# Patient Record
Sex: Female | Born: 1935 | Race: White | Hispanic: No | Smoking: Former smoker
Health system: Southern US, Community
[De-identification: ages and names within clinical notes are randomized; demographics above are authoritative.]

## PROBLEM LIST (undated history)

## (undated) DIAGNOSIS — H6123 Impacted cerumen, bilateral: Secondary | ICD-10-CM

## (undated) DIAGNOSIS — H9193 Unspecified hearing loss, bilateral: Secondary | ICD-10-CM

## (undated) DIAGNOSIS — M419 Scoliosis, unspecified: Secondary | ICD-10-CM

## (undated) DIAGNOSIS — Z0000 Encounter for general adult medical examination without abnormal findings: Secondary | ICD-10-CM

## (undated) DIAGNOSIS — Z23 Encounter for immunization: Secondary | ICD-10-CM

## (undated) DIAGNOSIS — R7989 Other specified abnormal findings of blood chemistry: Secondary | ICD-10-CM

## (undated) DIAGNOSIS — G3184 Mild cognitive impairment, so stated: Secondary | ICD-10-CM

## (undated) DIAGNOSIS — E7800 Pure hypercholesterolemia, unspecified: Secondary | ICD-10-CM

## (undated) DIAGNOSIS — R351 Nocturia: Secondary | ICD-10-CM

## (undated) DIAGNOSIS — I341 Nonrheumatic mitral (valve) prolapse: Secondary | ICD-10-CM

## (undated) DIAGNOSIS — R2681 Unsteadiness on feet: Secondary | ICD-10-CM

## (undated) DIAGNOSIS — C50919 Malignant neoplasm of unspecified site of unspecified female breast: Secondary | ICD-10-CM

## (undated) DIAGNOSIS — M1612 Unilateral primary osteoarthritis, left hip: Secondary | ICD-10-CM

## (undated) DIAGNOSIS — I10 Essential (primary) hypertension: Secondary | ICD-10-CM

## (undated) DIAGNOSIS — M533 Sacrococcygeal disorders, not elsewhere classified: Secondary | ICD-10-CM

## (undated) DIAGNOSIS — F0390 Unspecified dementia without behavioral disturbance: Secondary | ICD-10-CM

## (undated) DIAGNOSIS — J069 Acute upper respiratory infection, unspecified: Secondary | ICD-10-CM

## (undated) DIAGNOSIS — R5383 Other fatigue: Secondary | ICD-10-CM

## (undated) DIAGNOSIS — I4510 Unspecified right bundle-branch block: Secondary | ICD-10-CM

## (undated) DIAGNOSIS — R4701 Aphasia: Secondary | ICD-10-CM

## (undated) DIAGNOSIS — F0150 Vascular dementia without behavioral disturbance: Secondary | ICD-10-CM

## (undated) DIAGNOSIS — M810 Age-related osteoporosis without current pathological fracture: Secondary | ICD-10-CM

## (undated) DIAGNOSIS — R002 Palpitations: Secondary | ICD-10-CM

## (undated) DIAGNOSIS — E871 Hypo-osmolality and hyponatremia: Secondary | ICD-10-CM

## (undated) DIAGNOSIS — R413 Other amnesia: Secondary | ICD-10-CM

## (undated) HISTORY — DX: Unilateral primary osteoarthritis, left hip: M16.12

## (undated) HISTORY — DX: Encounter for immunization: Z23

## (undated) HISTORY — DX: Scoliosis, unspecified: M41.9

## (undated) HISTORY — DX: Unspecified hearing loss, bilateral: H91.93

## (undated) HISTORY — DX: Pure hypercholesterolemia, unspecified: E78.00

## (undated) HISTORY — DX: Unspecified dementia, unspecified severity, without behavioral disturbance, psychotic disturbance, mood disturbance, and anxiety: F03.90

## (undated) HISTORY — DX: Sacrococcygeal disorders, not elsewhere classified: M53.3

## (undated) HISTORY — DX: Impacted cerumen, bilateral: H61.23

## (undated) HISTORY — DX: Vascular dementia, unspecified severity, without behavioral disturbance, psychotic disturbance, mood disturbance, and anxiety: F01.50

## (undated) HISTORY — DX: Unsteadiness on feet: R26.81

## (undated) HISTORY — DX: Palpitations: R00.2

## (undated) HISTORY — DX: Other amnesia: R41.3

## (undated) HISTORY — DX: Acute upper respiratory infection, unspecified: J06.9

## (undated) HISTORY — PX: OTHER SURGICAL HISTORY: SHX169

## (undated) HISTORY — DX: Other fatigue: R53.83

## (undated) HISTORY — DX: Mild cognitive impairment of uncertain or unknown etiology: G31.84

## (undated) HISTORY — DX: Nonrheumatic mitral (valve) prolapse: I34.1

## (undated) HISTORY — DX: Essential (primary) hypertension: I10

## (undated) HISTORY — DX: Encounter for general adult medical examination without abnormal findings: Z00.00

## (undated) HISTORY — DX: Nocturia: R35.1

## (undated) HISTORY — DX: Malignant neoplasm of unspecified site of unspecified female breast: C50.919

## (undated) HISTORY — DX: Unspecified right bundle-branch block: I45.10

## (undated) HISTORY — DX: Age-related osteoporosis without current pathological fracture: M81.0

## (undated) HISTORY — DX: Other specified abnormal findings of blood chemistry: R79.89

## (undated) HISTORY — DX: Aphasia: R47.01

## (undated) HISTORY — PX: TONSILLECTOMY: SHX5217

---
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
## 1961-01-06 HISTORY — PX: TUBAL LIGATION: SHX77

## 2000-08-05 DIAGNOSIS — R002 Palpitations: Secondary | ICD-10-CM

## 2000-08-05 HISTORY — DX: Palpitations: R00.2

## 2001-09-28 DIAGNOSIS — M810 Age-related osteoporosis without current pathological fracture: Secondary | ICD-10-CM

## 2001-09-28 HISTORY — DX: Age-related osteoporosis without current pathological fracture: M81.0

## 2003-01-03 ENCOUNTER — Ambulatory Visit (HOSPITAL_COMMUNITY): Admission: RE | Admit: 2003-01-03 | Discharge: 2003-01-03 | Payer: Self-pay | Admitting: *Deleted

## 2006-07-14 ENCOUNTER — Inpatient Hospital Stay (HOSPITAL_COMMUNITY): Admission: EM | Admit: 2006-07-14 | Discharge: 2006-07-17 | Payer: Self-pay | Admitting: Emergency Medicine

## 2006-07-15 HISTORY — PX: LAPAROSCOPIC LYSIS OF ADHESIONS: SHX5905

## 2007-11-24 ENCOUNTER — Encounter: Admission: RE | Admit: 2007-11-24 | Discharge: 2007-11-24 | Payer: Self-pay | Admitting: Radiology

## 2007-12-23 ENCOUNTER — Encounter: Admission: RE | Admit: 2007-12-23 | Discharge: 2007-12-23 | Payer: Self-pay | Admitting: Surgery

## 2007-12-27 ENCOUNTER — Ambulatory Visit (HOSPITAL_BASED_OUTPATIENT_CLINIC_OR_DEPARTMENT_OTHER): Admission: RE | Admit: 2007-12-27 | Discharge: 2007-12-27 | Payer: Self-pay | Admitting: Surgery

## 2007-12-27 HISTORY — PX: BREAST LUMPECTOMY: SHX2

## 2008-01-19 ENCOUNTER — Ambulatory Visit: Payer: Self-pay | Admitting: Oncology

## 2008-02-02 LAB — CBC WITH DIFFERENTIAL/PLATELET
BASO%: 0.4 % (ref 0.0–2.0)
Basophils Absolute: 0 10*3/uL (ref 0.0–0.1)
EOS%: 0.9 % (ref 0.0–7.0)
HGB: 13.4 g/dL (ref 11.6–15.9)
MCH: 33 pg (ref 26.0–34.0)
MCHC: 34.3 g/dL (ref 32.0–36.0)
MCV: 96.2 fL (ref 81.0–101.0)
MONO%: 10.1 % (ref 0.0–13.0)
NEUT%: 55.5 % (ref 39.6–76.8)
RDW: 13.2 % (ref 11.3–14.5)
lymph#: 1.6 10*3/uL (ref 0.9–3.3)

## 2008-02-03 ENCOUNTER — Ambulatory Visit: Admission: RE | Admit: 2008-02-03 | Discharge: 2008-04-04 | Payer: Self-pay | Admitting: Radiation Oncology

## 2008-02-03 LAB — COMPREHENSIVE METABOLIC PANEL
ALT: 14 U/L (ref 0–35)
AST: 18 U/L (ref 0–37)
Alkaline Phosphatase: 68 U/L (ref 39–117)
BUN: 17 mg/dL (ref 6–23)
Chloride: 97 mEq/L (ref 96–112)
Creatinine, Ser: 0.84 mg/dL (ref 0.40–1.20)
Potassium: 4.1 mEq/L (ref 3.5–5.3)

## 2008-02-03 LAB — VITAMIN D 25 HYDROXY (VIT D DEFICIENCY, FRACTURES): Vit D, 25-Hydroxy: 43 ng/mL (ref 30–89)

## 2008-02-15 ENCOUNTER — Ambulatory Visit (HOSPITAL_COMMUNITY): Admission: RE | Admit: 2008-02-15 | Discharge: 2008-02-15 | Payer: Self-pay | Admitting: Oncology

## 2008-04-04 ENCOUNTER — Ambulatory Visit: Payer: Self-pay | Admitting: Oncology

## 2008-04-07 LAB — CBC WITH DIFFERENTIAL/PLATELET
Basophils Absolute: 0 10*3/uL (ref 0.0–0.1)
EOS%: 0.6 % (ref 0.0–7.0)
Eosinophils Absolute: 0 10*3/uL (ref 0.0–0.5)
HGB: 12.9 g/dL (ref 11.6–15.9)
MCH: 33 pg (ref 25.1–34.0)
MONO#: 0.5 10*3/uL (ref 0.1–0.9)
NEUT#: 3.5 10*3/uL (ref 1.5–6.5)
RDW: 13.4 % (ref 11.2–14.5)
WBC: 5.6 10*3/uL (ref 3.9–10.3)
lymph#: 1.5 10*3/uL (ref 0.9–3.3)

## 2008-04-10 LAB — COMPREHENSIVE METABOLIC PANEL
AST: 20 U/L (ref 0–37)
Albumin: 4.4 g/dL (ref 3.5–5.2)
BUN: 21 mg/dL (ref 6–23)
CO2: 29 mEq/L (ref 19–32)
Calcium: 9.3 mg/dL (ref 8.4–10.5)
Chloride: 98 mEq/L (ref 96–112)
Potassium: 4.5 mEq/L (ref 3.5–5.3)

## 2008-04-10 LAB — VITAMIN D 25 HYDROXY (VIT D DEFICIENCY, FRACTURES): Vit D, 25-Hydroxy: 68 ng/mL (ref 30–89)

## 2008-07-26 ENCOUNTER — Ambulatory Visit: Payer: Self-pay | Admitting: Oncology

## 2008-07-28 LAB — COMPREHENSIVE METABOLIC PANEL
ALT: 14 U/L (ref 0–35)
AST: 19 U/L (ref 0–37)
Albumin: 4.3 g/dL (ref 3.5–5.2)
Alkaline Phosphatase: 55 U/L (ref 39–117)
BUN: 15 mg/dL (ref 6–23)
Calcium: 9.3 mg/dL (ref 8.4–10.5)
Chloride: 100 mEq/L (ref 96–112)
Potassium: 4.2 mEq/L (ref 3.5–5.3)
Sodium: 135 mEq/L (ref 135–145)

## 2008-07-28 LAB — CBC WITH DIFFERENTIAL/PLATELET
BASO%: 0.4 % (ref 0.0–2.0)
EOS%: 0.8 % (ref 0.0–7.0)
HCT: 38.5 % (ref 34.8–46.6)
MCH: 32.6 pg (ref 25.1–34.0)
MCHC: 33.8 g/dL (ref 31.5–36.0)
MONO#: 0.4 10*3/uL (ref 0.1–0.9)
RBC: 4 10*6/uL (ref 3.70–5.45)
RDW: 13.6 % (ref 11.2–14.5)
WBC: 4.8 10*3/uL (ref 3.9–10.3)
lymph#: 1.6 10*3/uL (ref 0.9–3.3)

## 2008-07-28 LAB — VITAMIN D 25 HYDROXY (VIT D DEFICIENCY, FRACTURES): Vit D, 25-Hydroxy: 35 ng/mL (ref 30–89)

## 2008-07-28 LAB — LACTATE DEHYDROGENASE: LDH: 142 U/L (ref 94–250)

## 2008-11-15 ENCOUNTER — Ambulatory Visit: Payer: Self-pay | Admitting: Oncology

## 2008-11-17 LAB — CBC WITH DIFFERENTIAL/PLATELET
BASO%: 0.5 % (ref 0.0–2.0)
Basophils Absolute: 0 10*3/uL (ref 0.0–0.1)
EOS%: 0.5 % (ref 0.0–7.0)
HGB: 13.2 g/dL (ref 11.6–15.9)
MCH: 33.4 pg (ref 25.1–34.0)
MCHC: 33.9 g/dL (ref 31.5–36.0)
MONO#: 0.5 10*3/uL (ref 0.1–0.9)
RDW: 13.5 % (ref 11.2–14.5)
WBC: 5.8 10*3/uL (ref 3.9–10.3)
lymph#: 1.8 10*3/uL (ref 0.9–3.3)

## 2008-11-18 LAB — COMPREHENSIVE METABOLIC PANEL
ALT: 12 U/L (ref 0–35)
AST: 19 U/L (ref 0–37)
Albumin: 4.6 g/dL (ref 3.5–5.2)
CO2: 27 mEq/L (ref 19–32)
Calcium: 9.3 mg/dL (ref 8.4–10.5)
Chloride: 98 mEq/L (ref 96–112)
Potassium: 4.2 mEq/L (ref 3.5–5.3)

## 2008-11-18 LAB — VITAMIN D 25 HYDROXY (VIT D DEFICIENCY, FRACTURES): Vit D, 25-Hydroxy: 47 ng/mL (ref 30–89)

## 2008-12-11 DIAGNOSIS — C50919 Malignant neoplasm of unspecified site of unspecified female breast: Secondary | ICD-10-CM

## 2008-12-11 HISTORY — DX: Malignant neoplasm of unspecified site of unspecified female breast: C50.919

## 2009-03-20 ENCOUNTER — Ambulatory Visit: Payer: Self-pay | Admitting: Oncology

## 2009-03-22 LAB — CBC WITH DIFFERENTIAL/PLATELET
Basophils Absolute: 0 10*3/uL (ref 0.0–0.1)
Eosinophils Absolute: 0 10*3/uL (ref 0.0–0.5)
HGB: 14 g/dL (ref 11.6–15.9)
MCV: 99.5 fL (ref 79.5–101.0)
MONO#: 0.4 10*3/uL (ref 0.1–0.9)
NEUT#: 3.9 10*3/uL (ref 1.5–6.5)
RDW: 14 % (ref 11.2–14.5)
WBC: 5.8 10*3/uL (ref 3.9–10.3)
lymph#: 1.4 10*3/uL (ref 0.9–3.3)

## 2009-03-22 LAB — COMPREHENSIVE METABOLIC PANEL
Albumin: 4.6 g/dL (ref 3.5–5.2)
BUN: 16 mg/dL (ref 6–23)
CO2: 30 mEq/L (ref 19–32)
Calcium: 9.6 mg/dL (ref 8.4–10.5)
Chloride: 99 mEq/L (ref 96–112)
Glucose, Bld: 97 mg/dL (ref 70–99)
Potassium: 4 mEq/L (ref 3.5–5.3)
Sodium: 139 mEq/L (ref 135–145)
Total Protein: 6.8 g/dL (ref 6.0–8.3)

## 2009-03-22 LAB — CANCER ANTIGEN 27.29: CA 27.29: 24 U/mL (ref 0–39)

## 2009-03-22 LAB — VITAMIN D 25 HYDROXY (VIT D DEFICIENCY, FRACTURES): Vit D, 25-Hydroxy: 54 ng/mL (ref 30–89)

## 2009-09-16 IMAGING — US US ABDOMEN LIMITED
1 series · 14 of 25 positions shown · non-contrast
Comparison: CT abdomen and pelvis 07/14/2006.

CLINICAL DATA: History of breast cancer.

ABDOMEN ULTRASOUND LIMITED

[Series 1: us abdomen limited · 0.17mm/px · 14 of 32 slices shown]
[im 1/32]
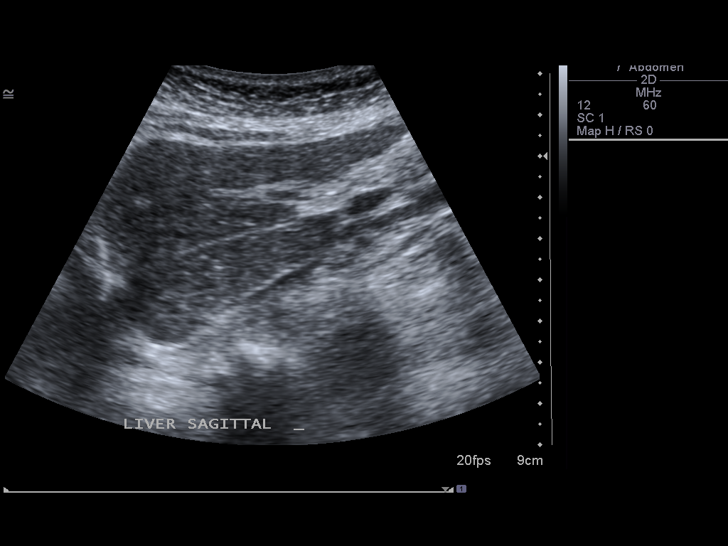
[im 3/32]
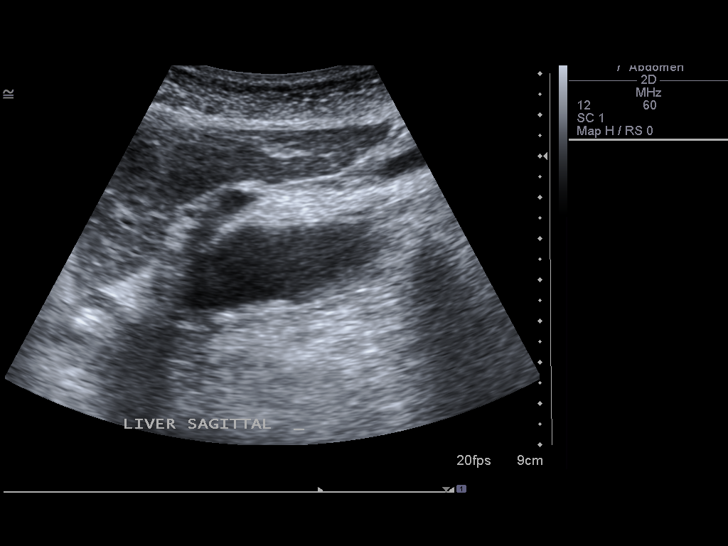
[im 6/32]
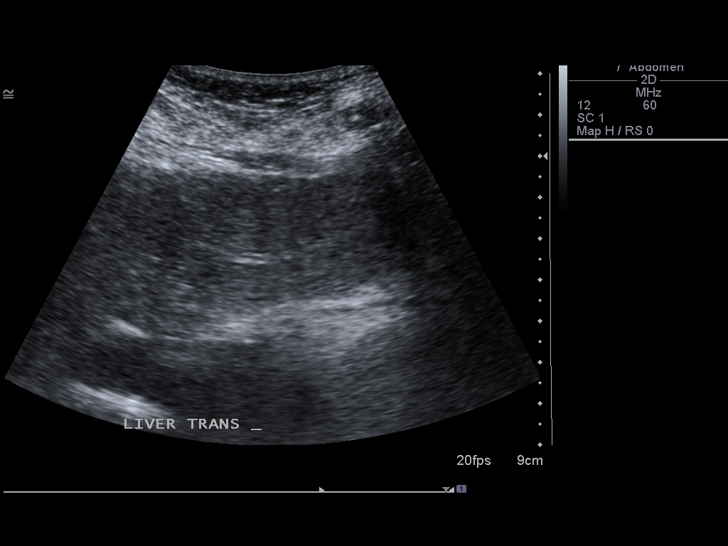
[im 8/32]
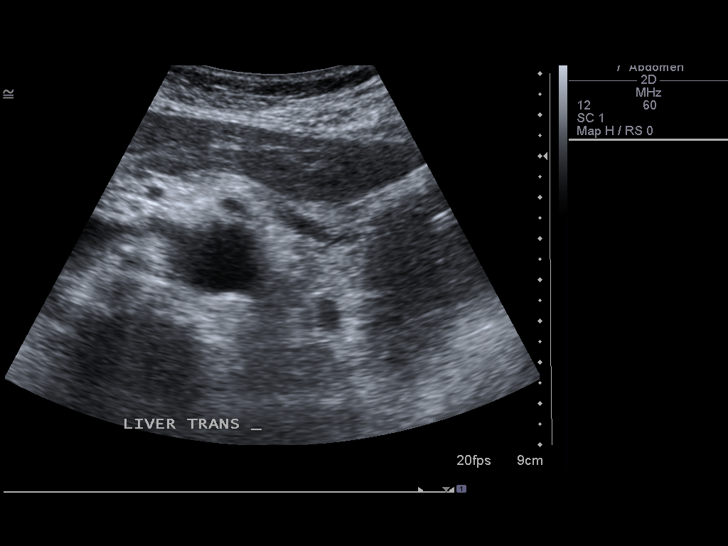
[im 11/32]
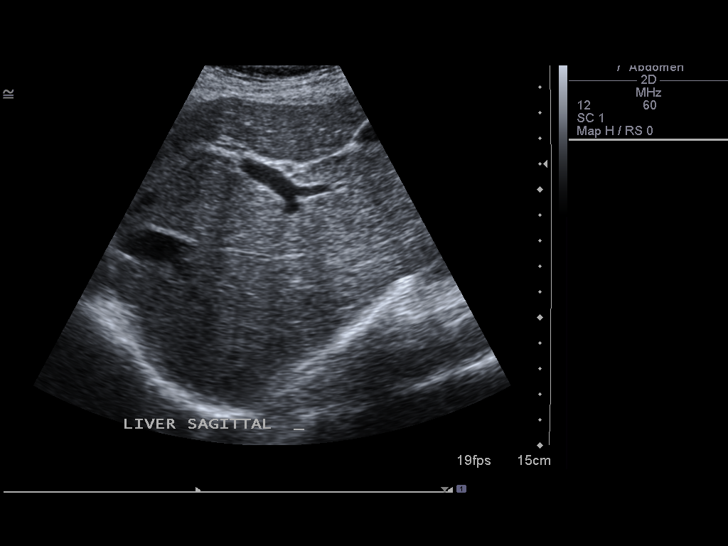
[im 12/32]
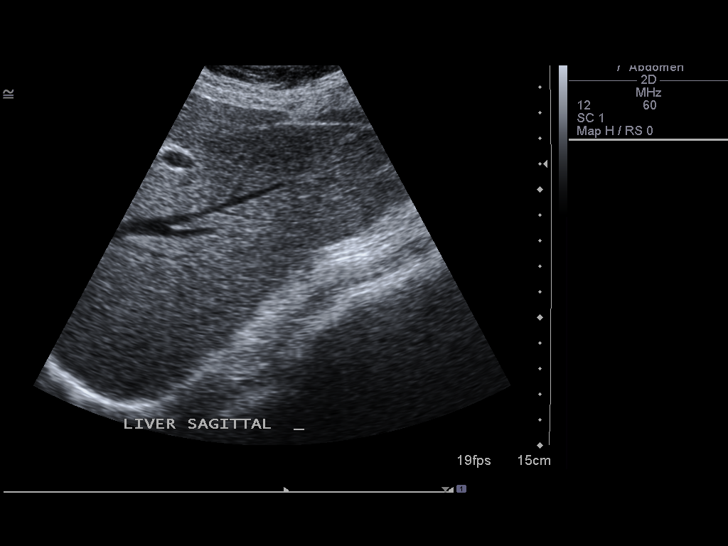
[im 15/32]
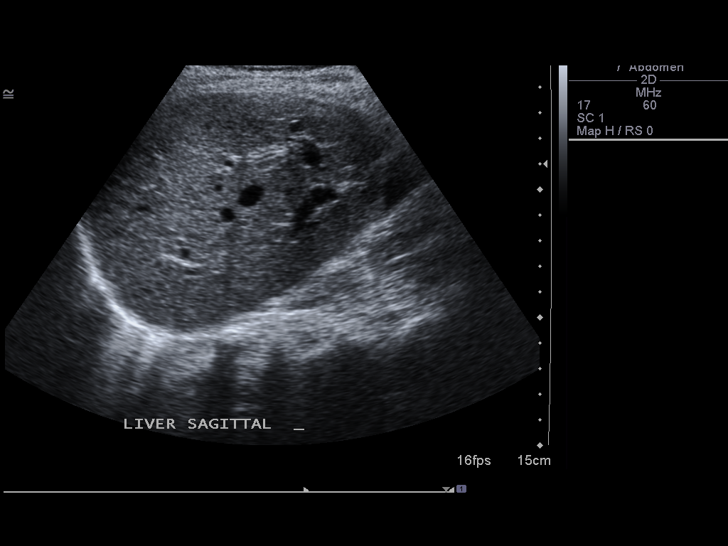
[im 17/32]
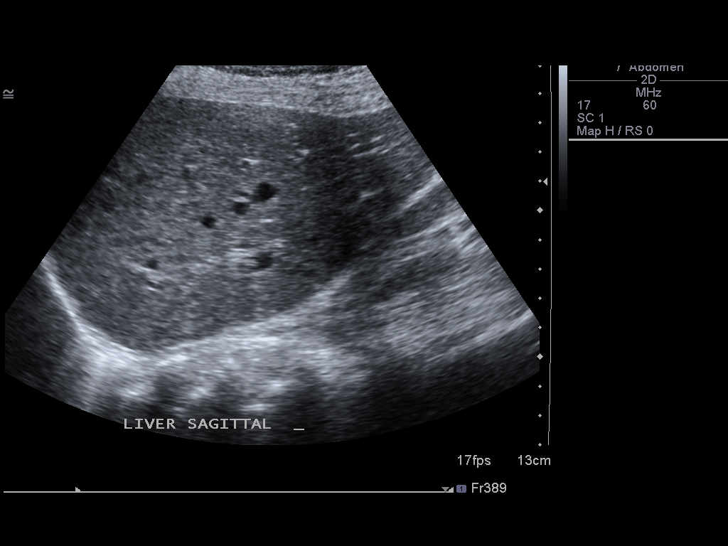
[im 20/32]
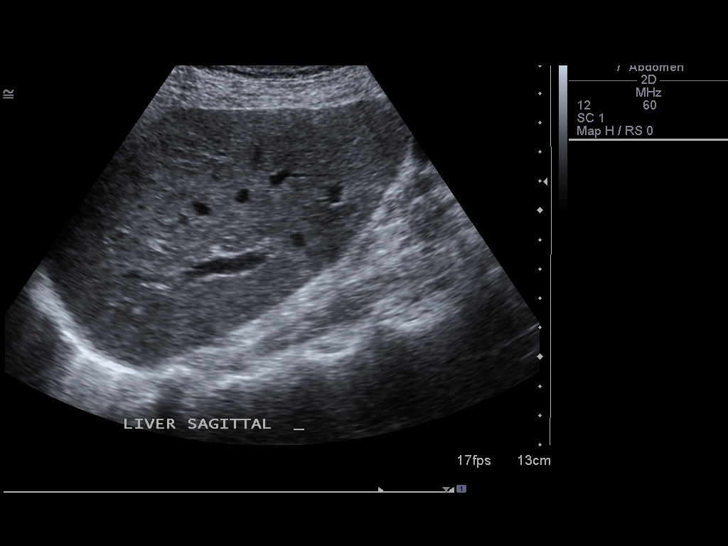
[im 21/32]
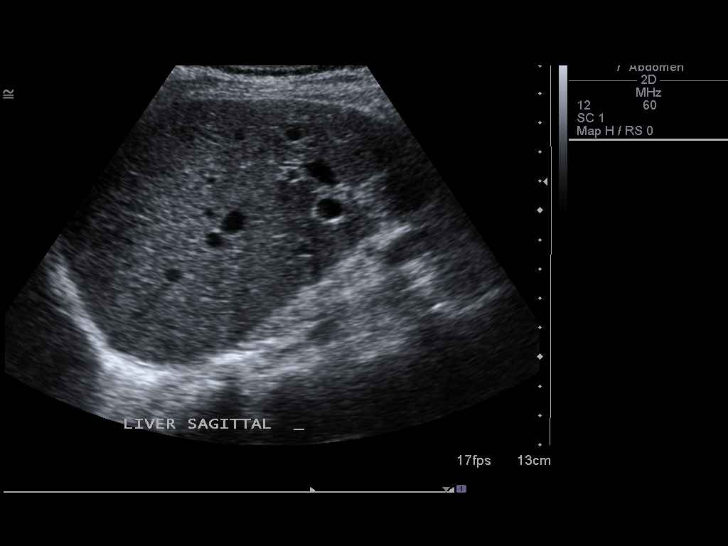
[im 24/32]
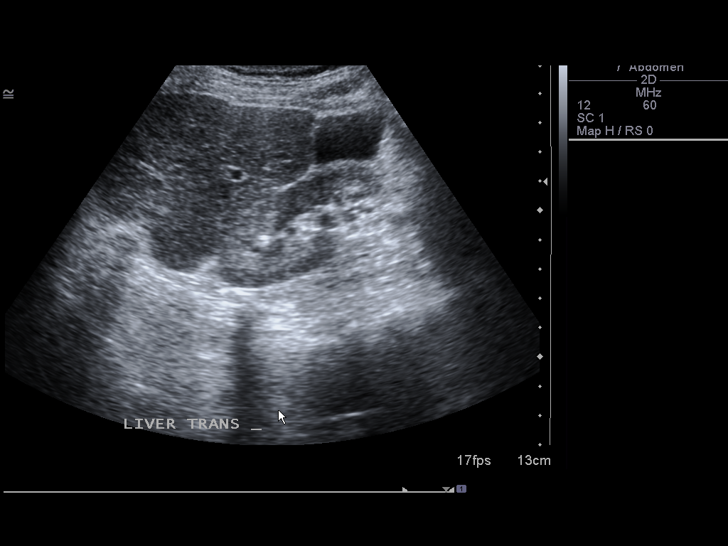
[im 26/32]
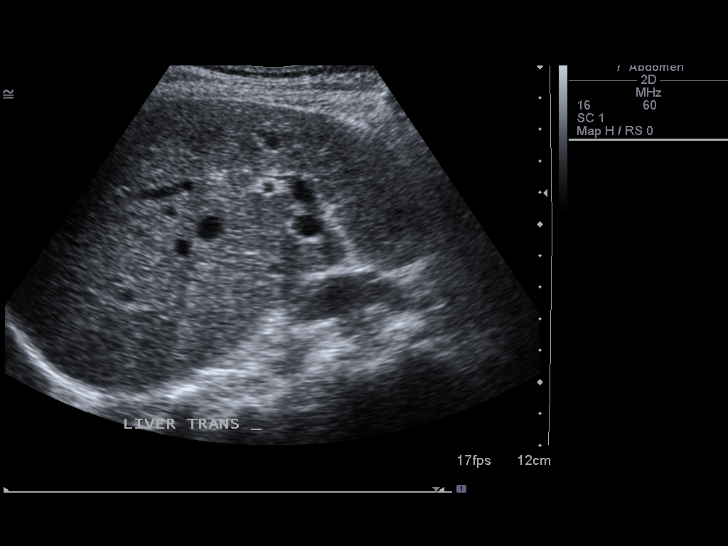
[im 29/32]
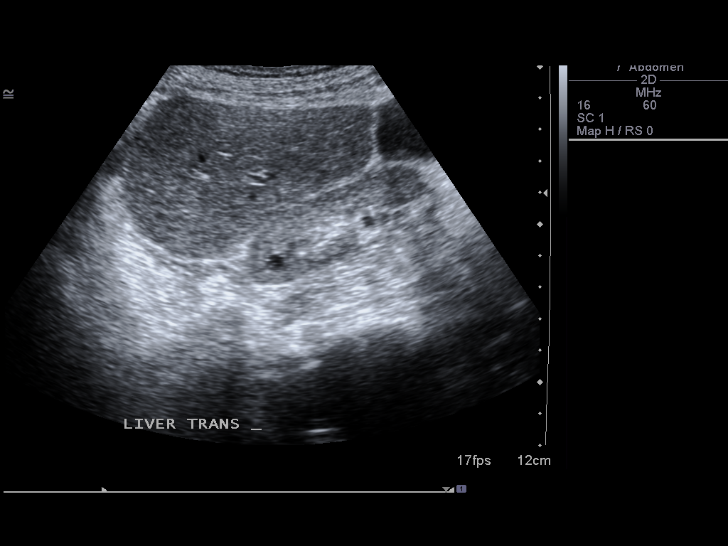
[im 32/32]
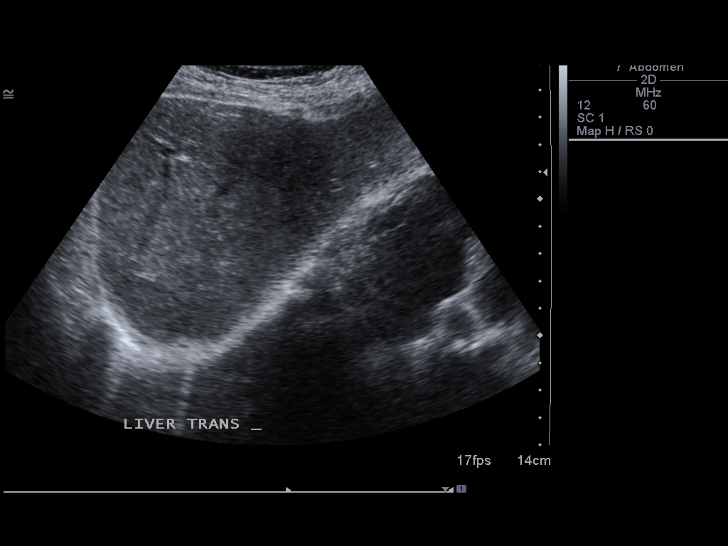

[14 of 25 positions shown; findings below may reference images not displayed]

FINDINGS: The liver appears normal without focal lesion.  No
intrahepatic biliary ductal dilatation is identified.  No free
fluid is seen.
IMPRESSION: Negative exam.  No focal liver abnormality.

REF:[id number] DICTATED: 02/15/2008 [DATE]

## 2009-11-14 ENCOUNTER — Ambulatory Visit: Payer: Self-pay | Admitting: Oncology

## 2009-11-16 LAB — CBC WITH DIFFERENTIAL/PLATELET
BASO%: 0.4 % (ref 0.0–2.0)
Basophils Absolute: 0 10*3/uL (ref 0.0–0.1)
EOS%: 0.3 % (ref 0.0–7.0)
HCT: 35.9 % (ref 34.8–46.6)
MCH: 34.2 pg — ABNORMAL HIGH (ref 25.1–34.0)
MCHC: 34.3 g/dL (ref 31.5–36.0)
MCV: 99.6 fL (ref 79.5–101.0)
MONO%: 7.8 % (ref 0.0–14.0)
NEUT%: 68.3 % (ref 38.4–76.8)
RDW: 13.5 % (ref 11.2–14.5)
lymph#: 1.5 10*3/uL (ref 0.9–3.3)

## 2009-11-17 LAB — COMPREHENSIVE METABOLIC PANEL
ALT: 13 U/L (ref 0–35)
AST: 18 U/L (ref 0–37)
Alkaline Phosphatase: 62 U/L (ref 39–117)
BUN: 24 mg/dL — ABNORMAL HIGH (ref 6–23)
Calcium: 9.6 mg/dL (ref 8.4–10.5)
Chloride: 102 mEq/L (ref 96–112)
Creatinine, Ser: 0.77 mg/dL (ref 0.40–1.20)

## 2009-11-17 LAB — VITAMIN D 25 HYDROXY (VIT D DEFICIENCY, FRACTURES): Vit D, 25-Hydroxy: 39 ng/mL (ref 30–89)

## 2010-01-21 DIAGNOSIS — R7989 Other specified abnormal findings of blood chemistry: Secondary | ICD-10-CM

## 2010-01-21 HISTORY — DX: Other specified abnormal findings of blood chemistry: R79.89

## 2010-05-21 NOTE — H&P (Signed)
NAMELARIN, DEPAOLI NO.:  [id number]   MEDICAL RECORD NO.:  [id number]          PATIENT TYPE:  INP   LOCATION:  1441                         FACILITY:  [facility]   PHYSICIAN:  Ardeth Sportsman, MD     DATE OF BIRTH:  11-15-1924   DATE OF ADMISSION:  07/13/2006  DATE OF DISCHARGE:                              HISTORY & PHYSICAL   PRIMARY CARE PHYSICIAN:  Erskine Speed, M.D.   REASON FOR ADMISSION:  Abdominal pain, possible small bowel obstruction.   HISTORY OF PRESENT ILLNESS:  Cynthia Watkins is an 77 year old female who is  otherwise pretty healthy with pretty good, regular bowel habits who  noted around 5:30 this evening, six hours ago, right sided abdominal  pain.  It radiated to her back.  She said it was associated with nausea  but no emesis.  No diarrhea, hematochezia or melena.  She says the pain  intensified and she was concerned and came into the emergency room.  Dr.  Read Drivers evaluated her and surgical consultation was requested by him for  concerns of abdominal pain and possible partial small bowel obstruction.   Her husband apparently had an episode of nausea and vomiting a couple of  days ago after trying some homemade mayonnaise but he threw that away  and she did not try any.  His symptoms resolved rather rapidly.  The  patient has not traveled outside of the country at all.  She has never  had abdominal pain like this before.  She did not really try any  medications or any other maneuvers to relieve things.  Since she has  been in the ER she has received some parenteral morphine and that seems  to have helped.  She has some mild nausea but no emesis.  She says that  she has been rather gassy.  When pressed it sounds like she has been  having a little bit of increased flatulence.   PAST MEDICAL HISTORY:  1. Atrial fibrillation in the past.  2. Hypertension.  3. Hyperlipidemia.  4. Osteoporosis.  5. Sigmoid diverticulosis and small internal hemorrhoids  as noted by      Dr. Sabino Gasser on January 03, 2003 by colonoscopy.   PAST SURGICAL HISTORY:  Open bilateral tubal ligation 40 years ago.  No  other abdominal surgeries.   SOCIAL HISTORY:  She lives with her spouse who is a retired Government social research officer.  Her son is a Insurance underwriter here in town.  No alcohol or tobacco or  other drug use.   FAMILY HISTORY:  Noncontributory for any major GI disorders.   ALLERGIES:  None.   MEDICATIONS:  1. Lipitor 40 mg daily.  2. Avapro 150 mg I believe daily.  3. Zetia 10 mg daily.  4. Geritol multivitamin.  5. Calcium 500 mg daily.   REVIEW OF SYSTEMS:  Standard comprehensive review of systems:  CONSTITUTIONAL:  No fevers, chills, sweats.  No weight gain or weight  loss.  HEENT:  Eyes: She does wear glasses but her vision has been  relatively stable. ENT:  Otherwise negative.  CARDIAC:  She is pretty  physically active.  She walks pretty regularly.  No exertional chest  pain associated with her symptoms.  GI:  As noted above, otherwise  negative.  GYN:  No  vaginal bleeding or discharge.  GU:  No history of  urinary tract infections or hematochezia or history of kidney stones,  otherwise negative.  MUSCULOSKELETAL:  Some mild osteoarthritis in her  major joints but she still can walk around okay.  DERMATOLOGIC,  PSYCHIATRIC, NEUROLOGICAL:  Otherwise negative.   PHYSICAL EXAMINATION:  VITAL SIGNS:  Her temperature is 97.9, pulse 68,  respirations 20, blood pressure is 133/88.  She has 99% saturation on  room air.  GENERAL APPEARANCE:  Pain rated 6 out of 10.  She is a well-developed,  well-nourished female.  She is uncomfortable but not frankly toxic.  PSYCHIATRIC:  She is pleasant and interactive with above-average  intelligence and excellent insight and recall.  No evidence of any  dementia, psychosis or paranoia.  NEUROLOGICAL:  Cranial nerves II-XII are intact.  Hand grips are 5/5  equal and symmetrical with no resting or intention tremors.   HEENT:  Eyes with pupils equal, round, reactive to light.  Extraocular  movements intact.  Sclerae is nonicteric.  Not injected.  She is  normocephalic, atraumatic.  Oropharynx and nasopharynx are clear.  There  is no facial asymmetry.  NECK:  Supple without any masses.  Trachea midline.  No obvious thyroid  masses.  HEART:  Regular rate and rhythm.  No murmurs, clicks or rubs.  No  carotid bruits.  Normal radial and dorsalis pedis pulses.  CHEST:  Clear to auscultation bilaterally.  No rales, rhonchi or  wheezing.  No pain to rub or sternal compression.  ABDOMEN:  Is maybe slightly distended but very soft. She has a little  asymmetry in her abdominal wall but no true incisional hernia.  She has  some mild right mid abdominal discomfort but no rebound tenderness or  guarding.  The left side of her abdomen seems to be pretty nontender.  GENITOURINARY:  Normal external female genitalia with no inguinal  hernia.  RECTAL:  Deferred per the patient's request, given negative colonoscopy  a couple of years ago.  MUSCULOSKELETAL:  Full range of motion of shoulders, arms, wrists as  well as hips, knees and ankles.  LYMPH NODES:  No head, neck, axillary or groin lymphadenopathy.  SKIN:  No obvious petechiae or purpura or other sores or lesions.   LABORATORY DATA:  She has a urinalysis that is negative.  She has a  white count of 11.  Hemoglobin of 12.6.  platelet count is 249,000.  Her  sodium is 127, potassium is 3.7, BUN slightly elevated around I believe  19 and her creatinine is 0.9.   CLINICAL DATA:  She has had a KUB which shows no stones or  hydronephrosis.  She has had a CT scan of the abdomen and pelvis with IV  contrast only which showed some mildly dilated small bowel loops with  perhaps some feculization of her distal small bowel.  She has plenty of  colonic air and above-average amount of stool in her colon consistent  with some chronic constipation.  There is no obvious internal  hernia or  ventral wall abdominal hernia.  No free fluid or free air.   ASSESSMENT/PLAN:  1. This is an 77 year old female with prior abdominal surgery with      some abdominal pain and CT scan findings concerning  for early      partial small bowel obstruction versus ileus of uncertain etiology.      Plans will be:      a.     Admit.      b.     Hydrate.      c.     Serial abdominal examinations.      d.     NPO for now.      e.     Increase activity.      f.     Perhaps advance her diet if improves, otherwise she may       require nasogastric tube decompression or maybe even ultimately       surgical exploration to sort out the etiology of her pain.  I am       hopeful she will recover since she has mild symptoms.   1. Hyponatremia.  For her hyponatremia will give her normal saline and      follow expectantly.  2. Borderline hypokalemia.  Will follow expectantly at this point.  3. Will resume home medications for now as noted above.      Ardeth Sportsman, MD  Electronically Signed     SCG/MEDQ  D:  07/14/2006  T:  07/14/2006  Job:  [id number]   cc:   Erskine Speed, M.D.  Fax: [phone]   Cynthia Libra, MD  Fax: [phone]

## 2010-05-21 NOTE — Op Note (Signed)
NAMEMERCIA, DOWE NO.:  [id number]   MEDICAL RECORD NO.:  [id number]          PATIENT TYPE:  INP   LOCATION:  1441                         FACILITY:  [facility]   PHYSICIAN:  Ardeth Sportsman, MD     DATE OF BIRTH:  12-08-1924   DATE OF PROCEDURE:  DATE OF DISCHARGE:                               OPERATIVE REPORT   PRIMARY CARE PHYSICIAN:  Erskine Speed, M.D.   PREOPERATIVE DIAGNOSES:  Small bowel obstruction, most likely secondary  to adhesive disease.   POSTOPERATIVE DIAGNOSES:  Small bowel obstruction, secondary to  adhesion, right lower quadrant.   PROCEDURES PERFORMED:  1. Laparoscopic lysis of adhesions.  2. Diagnostic laparoscopy.   SURGEON:  Ardeth Sportsman, M.D.   ASSISTANT:  Sheppard Plumber. Earlene Plater, M.D.   ANESTHESIA:  1. General anesthesia.  2. Local anesthetic as a field block for port sites.   SPECIMENS:  None.   DRAINS:  None.   ESTIMATED BLOOD LOSS:  Less than 5 mL.   COMPLICATIONS:  None.   INDICATIONS:  Ms. Tsutsui is an 77 year old female, who has had a history  of tubal ligation, done 40 years ago, who had an episode of worsening  abdominal pain and discomfort.  She was admitted and hydrated and seemed  initially to improve in the first 12 hours of her admission.  However,  afterwards, she started having worsening abdominal pain and elevated  white count.  Repeat scan showed evidence of transition zone in the  right lower quadrant.  Based on these concerns, recommendation was made  for diagnostic laparoscopy with possible conversion to open and with  probably lysis of adhesions.  Risks, such as stroke, heart attack, DVT,  PE, and death, were discussed.  Risks such as bleeding, need for  transfusion, wound infection, abscess, injury to organs, prolonged pain,  conversion to open, incisional hernia and other risks, were discussed.  Questions were answered.  She and her husband agreed to proceed.   OPERATIVE FINDINGS:  She had a single band  in the right lower quadrant,  on the side wall, just inferior to her cecum.  She had a cecal bascule,  somewhat floppy, but no evidence of any other abnormalitites.  Her  uterus and ovaries appeared to be normal.  She had some ischemia in the  small bowel around the region, but after re-freeing it up, it all pinked  up and looked viable.   DESCRIPTION OF PROCEDURE:  Informed consent was confirmed.  Patient was  given a dose of IV antibiotics just prior to induction.  She underwent  general anesthesia without difficulty.  She had a Foley catheter  sterilely placed.  She was positioned supine, both arms tucked.   I did do a rectal exam on her with her under general anesthesia, which  showed normal sphincter tone and scant amount of stool in the vault, no  rectal masses.  Patient's abdomen was prepped and draped in sterile  fashion, after being repositioned supine.   Entry was gained into the abdomen, using optical entry, using a 5 mm/0  degree scope.  Capnoperitoneum 15 mmHg provided good abdominal  insufflation.  Under direct visualization, a 5 mm port was placed in the  right upper quadrant and the left mid-abdomen.   Small bowel was moderately dilated.  Ultimately, I could find down to  the level of the cecum.  I was able to run the terminal ileum up to the  proximal ileum.  This section was decompressed.  I did come up to an  area just inferior to the cecum, which was somewhat floppy, that showed  a loop of sidewall adhesions and an ischemic loop of bowel.  The small  bowel was run from this point more proximally and, while dilated,  otherwise appeared to be normal.   After careful sharp dissection off the wall, I was able to come down to  the level of a single adhesive band.  This was carefully skeletonized  and sharply freed off.  Once this was released, the bowel pinked up and  began to feel better.  The decompressed distal bowel began to fill up  with enteral contents.  The  small bowel was run from the ileocecal valve  all the way up to the ligament of Treitz and there were no other  twisting turnings or other abnormalities.  There were no small masses.  The ischemia had markedly improved.   Diagnostic laparoscopy was performed and the liver and colon seemed  alright.  The stomach was rather dilated, but after switching the NG  tube, was able to be better decompressed.  She did not have evidence of  any significant hiatal hernia.  Her uterus was small, but normal, and she had normal ovaries.  There  were no adhesions nor abnormalities in that region at all.  She did have  a little bit of ascites, but this was freed off, was aspirated out.  She  had no significant bleeding.   Reinspection was made of the intestine one more time and there was no  evidence of any perforation or active bleeding.  The small intestine no  longer looked ischemic.   Two of the three abdominal ports were removed with no evidence of any  bleeding.  Capnoperitoneum was evacuated.  Final port was removed.  The  skin was closed and a sterile dressing was applied.   The patient was extubated and sent to the recovery room in stable  condition.   I explained the operative findings to the patient's husband, daughter-in-  law and her son.  Questions were answered.  Pictures were shown.  They  expressed understanding and appreciation.      Ardeth Sportsman, MD  Electronically Signed     SCG/MEDQ  D:  07/15/2006  T:  07/15/2006  Job:  [id number]   cc:   Erskine Speed, M.D.  Fax: [phone]

## 2010-05-21 NOTE — Op Note (Signed)
Cynthia Watkins, Cynthia Watkins                ACCOUNT NO.:  [id number]   MEDICAL RECORD NO.:  [id number]          PATIENT TYPE:  AMB   LOCATION:  DSC                          FACILITY:  [facility]   PHYSICIAN:  Currie Paris, M.D.DATE OF BIRTH:  Aug 08, 1924   DATE OF PROCEDURE:  12/27/2007  DATE OF DISCHARGE:                               OPERATIVE REPORT   PREOPERATIVE DIAGNOSIS:  Carcinoma right breast lower inner quadrant,  clinical stage I.   POSTOPERATIVE DIAGNOSIS:  Carcinoma right breast lower inner quadrant,  clinical stage I.   OPERATION:  Right lumpectomy.   SURGEON:  Currie Paris, MD   ANESTHESIA:  General.   CLINICAL HISTORY:  This is an 77 year old lady recently found to have a  right breast mass that was right almost 2 cm in size.  There was a  question of some nipple involvement on MRI and some questioning of skin  dimpling inferiorly in the breasts.  Nevertheless, it felt mobile over  the skin and areolar area as well as over the skin lower down.  We  reviewed the options with the patient and presented her at the cancer  conference and elected to proceed to a lumpectomy without a sentinel  node evaluation as there was no plans for postoperative chemo.  We also  discussed somewhat having neoadjuvant hormonal therapy but after  discussion elected to proceed directly to lumpectomy recognizing that  with her small breast, we might have very close or positive margins it  will need to be managed in some way after surgery.   DESCRIPTION OF PROCEDURE:  The patient was seen in the holding area and  she had no further questions.  We identified and marked the right breast  as the operative side.   The patient was taken to the operating room and after satisfactory  general endotracheal anesthesia had been obtained, the patient's right  breast was prepped and draped.  The time-out was done.   I put some 0.25% plain Marcaine initially.  I made a curvilinear suture  at the  areolar margin and this appeared to be right at the top edge of  the tumor.  I elevated the skin off of the nipple so that we divided the  tissue directly under the nipple getting as close to the nipple as  possible without removing the nipple and areolar complex.  I then worked  around medially and then raised a skin flap inferiorly so that we had  the area what appeared to be tethered and divided.  I then divided it  medially down to the chest wall and then coming around working from  medial to lateral around the superior and inferior edges and deep until  I had the entire lesion out.  Grossly, it appeared to be out completely  although there had been some tethering which was basically at the  junction of the inferior and deep margins at one point I thought was the  closest site.   I marked the specimen for orientation purposes with six colored inks and  sent that to pathology.  I also  put a suture in were I thought the tumor  was likely palpable to the closest and I went back and took additional  margin at that point, although I was right under the skin here and any  further margin inferiorly would need to include the skin and I took deep  margin to include the fascia.   I put more Marcaine in and made sure everything was dry.  I tried to  reapproximate some of the breast tissue followed by the subcu and skin  all with absorbable sutures.   The patient tolerated the procedure well and there were no  complications.  All counts were correct.      Currie Paris, M.D.  Electronically Signed     CJS/MEDQ  D:  12/27/2007  T:  12/28/2007  Job:  [id number]   cc:   Lenon Curt Chilton Si, M.D.

## 2010-05-21 NOTE — Discharge Summary (Signed)
NAMEARMONEE, BOJANOWSKI                ACCOUNT NO.:  [id number]   MEDICAL RECORD NO.:  [id number]          PATIENT TYPE:  INP   LOCATION:  1441                         [facility]ACILITY:  [facility]   PHYSICIAN:  Ardeth Sportsman, MD     DATE OF BIRTH:  November 10, 1924   DATE OF ADMISSION:  07/13/2006  DATE OF DISCHARGE:                               DISCHARGE SUMMARY   PRIMARY CARE PHYSICIAN:  Erskine Speed, M.D.   DISCHARGE DIAGNOSES:  1. Small bowel obstruction secondary to adhesions.  2. History of atrial fibrillation in the past.  3. Hypertension.  4. Hyperlipidemia.  5. Osteoporosis.  6. Sigmoid diverticulosis and hemorrhoids by colonoscopy by Dr. Virginia Rochester in      2004.  7. Status post open bilateral tubal ligation 48 years ago.   PROCEDURE PERFORMED:  Diagnostic laparoscopy with lysis of adhesions on  July 16, 2006.   SUMMARY OF HOSPITAL COURSE:  Ms. Eisen is a pleasant 77 year old female  who is the wife of retired Transport planner Dr. Annabell Howells and mother to a  urologist, Dr. Bjorn Pippin, who unfortunately developed some abdominal  pain and nausea and came to the emergency room.  Dr. Read Drivers had concerns  and he requested surgical consultation.  The patient was admitted and  hydrated and she essentially seemed to be feeling better later in the  day.  However, by the following day she had worsening abdominal pain and  some leukocytosis.  Based on these concerns, I recommend that she have  laparoscopic, possible open, exploration for probable lysis of  adhesions.  She underwent this and after lysing a band in her right  lower quadrant, she seemed to open up quite well.  Postoperatively, she had a nasogastric tube in overnight which had very  little output and no nausea.  She immediately started having flatus.  We  advanced her to a pureed diet and she was tolerating this well at the  time of discharge.  She was walking the hallways well with adequate pain  control.  Her nausea and abdominal pain  completely resolved.   Based on these improvements, thought it was safe for her to be  discharged home with the following instructions:  1. She is to return to the clinic to see me around 9 a.m. on Friday,      25 July.  Her husband has my card.  2. She should advance her diet as tolerated and call if she has any      fevers, chills, sweats, nausea, vomiting, worsening abdominal pain,      or uncontrolled diarrhea, or constipation.  3. It is safe to take a gentle laxative if needed if she is still      feeling a little bit constipated.  4. She should continue to walk up to a 1/2 a day as tolerated to help      stimulate bowel function and avoid any other health issues.  5. She should resume her home medications which include Lipitor 40 mg      daily, Avapro 75 mg p.o. q.a.m. and q.p.m., Zetia 10 mg -  I think      that might be on hold, calcium 500 mg b.i.d., aspirin 81 mg daily,      and multivitamin daily.  6. She also can take Vicodin 5/500, one to two p.o. q.6 h. p.r.n.      pain.  7. She also can take Tylenol and ibuprofen over-the-counter for pain      control and ice pack and heating pads as needed for abdominal pain.      All are in agreement with the plan.      Ardeth Sportsman, MD  Electronically Signed     SCG/MEDQ  D:  07/17/2006  T:  07/17/2006  Job:  [id number]   cc:   Erskine Speed, M.D.  Fax: [phone]

## 2010-05-24 ENCOUNTER — Telehealth: Payer: Self-pay | Admitting: *Deleted

## 2010-05-24 NOTE — Telephone Encounter (Signed)
Dr. Annabell Howells called stating his wife has been tachycardic today; not feeling good, sl temp; BP 119/70 HR 88;states he listened to chest and heart was" beating fast". Per Dr. [place] does not have history of AFIB so advised to watch and if continues to be elevated may need to go to urgent care or ER. Also advised w/ sl temp may cause HR to be fast. Advised that if she continues and doesn't want to go to ER to call back Mon AM and our NP will see. Seemed to be OK w/ that.

## 2010-06-21 ENCOUNTER — Encounter (HOSPITAL_BASED_OUTPATIENT_CLINIC_OR_DEPARTMENT_OTHER): Payer: Medicare Other | Admitting: Oncology

## 2010-06-21 ENCOUNTER — Other Ambulatory Visit: Payer: Self-pay | Admitting: Oncology

## 2010-06-21 DIAGNOSIS — C50519 Malignant neoplasm of lower-outer quadrant of unspecified female breast: Secondary | ICD-10-CM

## 2010-06-21 DIAGNOSIS — Z170 Estrogen receptor positive status [ER+]: Secondary | ICD-10-CM

## 2010-06-21 LAB — CBC WITH DIFFERENTIAL/PLATELET
BASO%: 0.4 % (ref 0.0–2.0)
Basophils Absolute: 0 10*3/uL (ref 0.0–0.1)
Eosinophils Absolute: 0 10*3/uL (ref 0.0–0.5)
HCT: 33.9 % — ABNORMAL LOW (ref 34.8–46.6)
HGB: 11.7 g/dL (ref 11.6–15.9)
LYMPH%: 29 % (ref 14.0–49.7)
MONO#: 0.5 10*3/uL (ref 0.1–0.9)
NEUT#: 4 10*3/uL (ref 1.5–6.5)
NEUT%: 62 % (ref 38.4–76.8)
Platelets: 266 10*3/uL (ref 145–400)
WBC: 6.4 10*3/uL (ref 3.9–10.3)
lymph#: 1.9 10*3/uL (ref 0.9–3.3)

## 2010-06-21 LAB — COMPREHENSIVE METABOLIC PANEL
ALT: 13 U/L (ref 0–35)
BUN: 15 mg/dL (ref 6–23)
CO2: 27 mEq/L (ref 19–32)
Calcium: 9.1 mg/dL (ref 8.4–10.5)
Chloride: 100 mEq/L (ref 96–112)
Creatinine, Ser: 0.77 mg/dL (ref 0.50–1.10)
Glucose, Bld: 99 mg/dL (ref 70–99)
Total Bilirubin: 0.4 mg/dL (ref 0.3–1.2)

## 2010-06-21 LAB — VITAMIN D 25 HYDROXY (VIT D DEFICIENCY, FRACTURES): Vit D, 25-Hydroxy: 60 ng/mL (ref 30–89)

## 2010-06-28 ENCOUNTER — Encounter (HOSPITAL_BASED_OUTPATIENT_CLINIC_OR_DEPARTMENT_OTHER): Payer: Medicare Other | Admitting: Oncology

## 2010-08-22 ENCOUNTER — Other Ambulatory Visit: Payer: Self-pay | Admitting: *Deleted

## 2010-08-22 MED ORDER — VALSARTAN 160 MG PO TABS: 160.0000 mg | ORAL_TABLET | Freq: Every day | ORAL | Status: DC

## 2010-08-22 NOTE — Telephone Encounter (Signed)
escribe medication per fax request  

## 2010-08-23 ENCOUNTER — Other Ambulatory Visit: Payer: Self-pay | Admitting: *Deleted

## 2010-08-23 MED ORDER — VALSARTAN 160 MG PO TABS: 160.0000 mg | ORAL_TABLET | Freq: Every day | ORAL | Status: DC

## 2010-08-23 NOTE — Telephone Encounter (Signed)
escribe medication per fax request  

## 2010-10-01 ENCOUNTER — Other Ambulatory Visit: Payer: Self-pay | Admitting: Cardiology

## 2010-10-01 MED ORDER — VALSARTAN 160 MG PO TABS: 160.0000 mg | ORAL_TABLET | Freq: Every day | ORAL | Status: DC

## 2010-10-01 NOTE — Telephone Encounter (Signed)
Pt needs refill on diovan 160mg  qd called into [facility] on [address] [phone]

## 2010-10-01 NOTE — Telephone Encounter (Signed)
escribe medication per fax request  

## 2010-10-09 ENCOUNTER — Encounter: Payer: Self-pay | Admitting: Cardiology

## 2010-10-09 ENCOUNTER — Ambulatory Visit (INDEPENDENT_AMBULATORY_CARE_PROVIDER_SITE_OTHER): Payer: Medicare Other | Admitting: Cardiology

## 2010-10-09 VITALS — BP 110/60 | HR 76 | Ht 61.0 in | Wt 102.8 lb

## 2010-10-09 DIAGNOSIS — E7800 Pure hypercholesterolemia, unspecified: Secondary | ICD-10-CM | POA: Insufficient documentation

## 2010-10-09 DIAGNOSIS — I4510 Unspecified right bundle-branch block: Secondary | ICD-10-CM | POA: Insufficient documentation

## 2010-10-09 DIAGNOSIS — I10 Essential (primary) hypertension: Secondary | ICD-10-CM

## 2010-10-09 DIAGNOSIS — E785 Hyperlipidemia, unspecified: Secondary | ICD-10-CM

## 2010-10-09 DIAGNOSIS — I452 Bifascicular block: Secondary | ICD-10-CM

## 2010-10-09 NOTE — Assessment & Plan Note (Signed)
She is on chronic therapy with Lipitor. Apparently her dose was reduced. She is concerned about the reports of statins being associated with diabetes. Dr. Chilton Si is monitoring her blood work. I will defer to him. Since she does not have established vascular disease and has made significant dietary changes perhaps we do not need to be quite as aggressive about lipid-lowering therapy.

## 2010-10-09 NOTE — Progress Notes (Signed)
   Cynthia Watkins Date of Birth: Feb 18, 1924   History of Present Illness: Cynthia Watkins is seen today for followup. She was last seen here over 2 years ago. She has a history of hypertension and hypercholesterolemia. She also has a history of a right bundle branch block. She has gone on a strict vegetarian diet. As a result she has lost 10 pounds. She complains that her blood pressure has been dropping particularly after exercise and when this happens she has no energy. For the most part her blood pressure has been under excellent control. She rarely has a reading as high as 150 systolic. She denies any chest pain or shortness of breath.  Current Outpatient Prescriptions on File Prior to Visit  Medication Sig Dispense Refill  . atorvastatin (LIPITOR) 40 MG tablet Take 40 mg by mouth daily.        . Calcium Carbonate-Vitamin D (OS-CAL 500 + D PO) Take by mouth daily.        . Cholecalciferol (VITAMIN D PO) Take 1,000 mg by mouth daily. Vitamin D3      . tamoxifen (NOLVADEX) 10 MG tablet Take 20 mg by mouth daily.        . valsartan (DIOVAN) 160 MG tablet Take 1 tablet (160 mg total) by mouth daily.  30 tablet  5    No Known Allergies  Past Medical History  Diagnosis Date  . Hypertension     Well Controlled  . Hypercholesterolemia     on lipitor  . Right bundle branch block   . Breast cancer   . Mitral valve prolapse     Past Surgical History  Procedure Date  . Tubal ligation   . Tonsillectomy   . Other surgical history     Laparoscopic surgery for adhesions    History  Smoking status  . Former Smoker  Smokeless tobacco  . Not on file    History  Alcohol Use  . Yes    Drinks One Glass per Day    History reviewed. No pertinent family history.  Review of Systems: As the history of present illness..  All other systems were reviewed and are negative.  Physical Exam: BP 110/60  Pulse 76  Ht 5\' 1"  (1.549 m)  Wt 102 lb 12.8 oz (46.63 kg)  BMI 19.42 kg/m2 She is a  pleasant, elderly white female in no acute distress.The patient is alert and oriented x 3.  The mood and affect are normal.  The skin is warm and dry.  Color is normal.  The HEENT exam reveals that the sclera are nonicteric.  The mucous membranes are moist.  The carotids are 2+ without bruits.  There is no thyromegaly.  There is no JVD.  The lungs are clear.  The chest wall is non tender.  The heart exam reveals a regular rate with a normal S1 and S2.  There are no murmurs, gallops, or rubs.  The PMI is not displaced.   Abdominal exam reveals good bowel sounds.  There is no guarding or rebound.  There is no hepatosplenomegaly or tenderness.  There are no masses.  Exam of the legs reveal no clubbing, cyanosis, or edema.  The legs are without rashes.  The distal pulses are intact.  Cranial nerves II - XII are intact.  Motor and sensory functions are intact.  The gait is normal.  LABORATORY DATA:   Assessment / Plan:

## 2010-10-09 NOTE — Patient Instructions (Signed)
Try reducing your diovan to 80 mg daily and monitor.  I will see you again in 1 year.

## 2010-10-09 NOTE — Assessment & Plan Note (Addendum)
Blood pressure is under excellent control. She appears to be getting some drop in her blood pressure at times is associated with fatigue. I recommended reducing her Diovan to 80 mg daily and monitoring it closely.

## 2010-10-11 LAB — URINE MICROSCOPIC-ADD ON

## 2010-10-11 LAB — CBC
MCV: 98.4 fL (ref 78.0–100.0)
Platelets: 287 10*3/uL (ref 150–400)
RBC: 4.13 MIL/uL (ref 3.87–5.11)
WBC: 5.4 10*3/uL (ref 4.0–10.5)

## 2010-10-11 LAB — COMPREHENSIVE METABOLIC PANEL
ALT: 13 U/L (ref 0–35)
AST: 23 U/L (ref 0–37)
Albumin: 3.8 g/dL (ref 3.5–5.2)
CO2: 31 mEq/L (ref 19–32)
Chloride: 99 mEq/L (ref 96–112)
Creatinine, Ser: 0.83 mg/dL (ref 0.4–1.2)
GFR calc Af Amer: >60 mL/min (ref >60)
GFR calc non Af Amer: >60 mL/min (ref >60)
Sodium: 136 mEq/L (ref 135–145)
Total Bilirubin: 0.7 mg/dL (ref 0.3–1.2)

## 2010-10-11 LAB — DIFFERENTIAL
Basophils Absolute: 0 10*3/uL (ref 0.0–0.1)
Eosinophils Absolute: 0 10*3/uL (ref 0.0–0.7)
Eosinophils Relative: 1 % (ref 0–5)
Lymphocytes Relative: 34 % (ref 12–46)
Lymphs Abs: 1.8 10*3/uL (ref 0.7–4.0)
Monocytes Absolute: 0.4 10*3/uL (ref 0.1–1.0)

## 2010-10-11 LAB — URINALYSIS, ROUTINE W REFLEX MICROSCOPIC
Bilirubin Urine: NEGATIVE
Glucose, UA: NEGATIVE mg/dL
Hgb urine dipstick: NEGATIVE
Specific Gravity, Urine: 1.018 (ref 1.005–1.030)
Urobilinogen, UA: 0.2 mg/dL (ref 0.0–1.0)

## 2010-10-22 LAB — DIFFERENTIAL
Basophils Absolute: 0
Eosinophils Absolute: 0
Eosinophils Relative: 0
Lymphocytes Relative: 14
Lymphs Abs: 1.5
Neutrophils Relative %: 81 — ABNORMAL HIGH

## 2010-10-22 LAB — CREATININE, SERUM
GFR calc Af Amer: >60
GFR calc non Af Amer: >60

## 2010-10-22 LAB — CBC
HCT: 32.9 — ABNORMAL LOW
HCT: 36
HCT: 36.4
Hemoglobin: 11.3 — ABNORMAL LOW
Hemoglobin: 11.6 — ABNORMAL LOW
Hemoglobin: 12.4
MCHC: 34.4
MCHC: 35.5
MCV: 95.4
Platelets: 207
Platelets: 209
Platelets: 249
RBC: 3.5 — ABNORMAL LOW
RBC: 3.78 — ABNORMAL LOW
RDW: 13.1
RDW: 13.7
RDW: 13.8
WBC: 10.5
WBC: 11 — ABNORMAL HIGH

## 2010-10-22 LAB — BASIC METABOLIC PANEL
BUN: 19
BUN: 9
CO2: 26
CO2: 28
Calcium: 7.9 — ABNORMAL LOW
Calcium: 8.1 — ABNORMAL LOW
Chloride: 102
Chloride: 102
Creatinine, Ser: 0.65
Creatinine, Ser: 0.8
Creatinine, Ser: 0.93
GFR calc Af Amer: >60
GFR calc Af Amer: >60
GFR calc non Af Amer: 58 — ABNORMAL LOW
Glucose, Bld: 126 — ABNORMAL HIGH
Glucose, Bld: 140 — ABNORMAL HIGH
Glucose, Bld: 141 — ABNORMAL HIGH
Potassium: 3.7
Sodium: 133 — ABNORMAL LOW
Sodium: 134 — ABNORMAL LOW

## 2010-10-22 LAB — URINALYSIS, ROUTINE W REFLEX MICROSCOPIC
Bilirubin Urine: NEGATIVE
Ketones, ur: NEGATIVE
Nitrite: NEGATIVE
Protein, ur: NEGATIVE
Urobilinogen, UA: 0.2

## 2010-10-22 LAB — PHOSPHORUS: Phosphorus: 1.8 — ABNORMAL LOW

## 2010-12-06 ENCOUNTER — Telehealth: Payer: Self-pay | Admitting: Oncology

## 2010-12-06 NOTE — Telephone Encounter (Signed)
per pof 06/22 called pts home lmov[facility] for appts in jan2013 and to rtn call to confirm appts

## 2010-12-10 ENCOUNTER — Other Ambulatory Visit: Payer: Self-pay | Admitting: *Deleted

## 2010-12-10 ENCOUNTER — Telehealth: Payer: Self-pay | Admitting: Oncology

## 2010-12-10 DIAGNOSIS — C50519 Malignant neoplasm of lower-outer quadrant of unspecified female breast: Secondary | ICD-10-CM

## 2010-12-10 MED ORDER — TAMOXIFEN CITRATE 10 MG PO TABS: 20.0000 mg | ORAL_TABLET | Freq: Every day | ORAL | Status: DC

## 2010-12-10 NOTE — Telephone Encounter (Signed)
pt rtn call and confirm appts for jan2013

## 2010-12-26 ENCOUNTER — Encounter: Payer: Self-pay | Admitting: Oncology

## 2011-01-08 ENCOUNTER — Other Ambulatory Visit: Payer: Self-pay

## 2011-01-08 DIAGNOSIS — C50519 Malignant neoplasm of lower-outer quadrant of unspecified female breast: Secondary | ICD-10-CM

## 2011-01-08 MED ORDER — TAMOXIFEN CITRATE 10 MG PO TABS: 20.0000 mg | ORAL_TABLET | Freq: Every day | ORAL | Status: DC

## 2011-01-10 ENCOUNTER — Other Ambulatory Visit: Payer: Self-pay | Admitting: Oncology

## 2011-01-10 ENCOUNTER — Other Ambulatory Visit (HOSPITAL_BASED_OUTPATIENT_CLINIC_OR_DEPARTMENT_OTHER): Payer: Medicare Other | Admitting: Lab

## 2011-01-10 DIAGNOSIS — Z170 Estrogen receptor positive status [ER+]: Secondary | ICD-10-CM

## 2011-01-10 DIAGNOSIS — C50519 Malignant neoplasm of lower-outer quadrant of unspecified female breast: Secondary | ICD-10-CM

## 2011-01-10 LAB — CBC WITH DIFFERENTIAL/PLATELET
Basophils Absolute: 0 10*3/uL (ref 0.0–0.1)
EOS%: 0.2 % (ref 0.0–7.0)
HCT: 37.4 % (ref 34.8–46.6)
HGB: 12.9 g/dL (ref 11.6–15.9)
MCH: 34.3 pg — ABNORMAL HIGH (ref 25.1–34.0)
MCV: 99.4 fL (ref 79.5–101.0)
MONO%: 5.5 % (ref 0.0–14.0)
NEUT%: 73.1 % (ref 38.4–76.8)
RDW: 13.7 % (ref 11.2–14.5)

## 2011-01-10 LAB — COMPREHENSIVE METABOLIC PANEL
AST: 19 U/L (ref 0–37)
Alkaline Phosphatase: 54 U/L (ref 39–117)
BUN: 16 mg/dL (ref 6–23)
Creatinine, Ser: 0.8 mg/dL (ref 0.50–1.10)

## 2011-01-17 ENCOUNTER — Ambulatory Visit: Payer: Medicare Other | Admitting: Oncology

## 2011-01-21 ENCOUNTER — Ambulatory Visit (HOSPITAL_BASED_OUTPATIENT_CLINIC_OR_DEPARTMENT_OTHER): Payer: Medicare Other | Admitting: Physician Assistant

## 2011-01-21 VITALS — BP 149/68 | HR 75 | Temp 98.1°F | Ht 61.0 in | Wt 107.4 lb

## 2011-01-21 DIAGNOSIS — Z170 Estrogen receptor positive status [ER+]: Secondary | ICD-10-CM

## 2011-01-21 DIAGNOSIS — C50519 Malignant neoplasm of lower-outer quadrant of unspecified female breast: Secondary | ICD-10-CM

## 2011-01-21 DIAGNOSIS — Z79810 Long term (current) use of selective estrogen receptor modulators (SERMs): Secondary | ICD-10-CM

## 2011-01-21 DIAGNOSIS — C50911 Malignant neoplasm of unspecified site of right female breast: Secondary | ICD-10-CM

## 2011-01-21 DIAGNOSIS — C50919 Malignant neoplasm of unspecified site of unspecified female breast: Secondary | ICD-10-CM

## 2011-01-21 MED ORDER — TAMOXIFEN CITRATE 10 MG PO TABS: 20.0000 mg | ORAL_TABLET | Freq: Every day | ORAL | Status: DC

## 2011-01-21 NOTE — Progress Notes (Signed)
Hematology and Oncology Follow Up Visit  Cynthia Watkins [id number] 03/27/1924 78 y.o. 01/21/2011    HPI:     Cynthia Watkins is an 78 year old [place] woman with a history of a T2 N1, ER/PR positive, HER-2 positive breast cancer, status post right lumpectomy with sentinel lymph node dissection in December 2009 followed by radiation therapy completed in March 2010. She utilize Femara 2.5 mg by mouth daily from 03/2008 through 11/2009. Currently on tamoxifen.   Interim History:      Cynthia Watkins is seen today pertain to her history of a T2, N1, ER/PR/HER-2 positive right breast carcinoma for which she underwent a right lumpectomy with sentinel node dissection in December of 2009 followed by radiation therapy. She was on Femara between March of 2010 and in November of 2011 at which time she was switched to tamoxifen due to decreasing bone density.  Since her last office visit on 06/28/2010, she has continued to do quite well. She denies any fevers chills night sweats shortness of breath chest pain. She denies any nausea emesis diarrhea or constipation issues.  She denies any calf tenderness or palpable venous cords. She denies any vaginal bleeding or unusual discharge. She continues to walk daily and at least one hour. She also does strengthening and stretching exercises. Her appetite has been excellent. She has not experienced any unexplained weight loss. She denies any palpable breast masses. She had her annual mammogram performed at [facility] on 11/20/2010 which was reviewed and is noted to be normal.     A detailed review of systems is otherwise noncontributory as noted below.  Review of Systems: Constitutional:  no weight loss, fever, night sweats and feels well Eyes: uses glasses ENT:No complaints Cardiovascular: no chest pain or dyspnea on exertion Respiratory: no cough, shortness of breath, or wheezing Neurological: no TIA or stroke symptoms Dermatological:  negative Gastrointestinal: no abdominal pain, change in bowel habits, or black or bloody stools Genito-Urinary: no dysuria, trouble voiding, or hematuria Hematological and Lymphatic: negative Breast: negative for breast lumps Musculoskeletal: negative Remaining ROS negative.   Medications:   I have reviewed the patient's current medications.  Current Outpatient Prescriptions  Medication Sig Dispense Refill  . atorvastatin (LIPITOR) 40 MG tablet Take 20 mg by mouth daily. 10 mg tablet per day.      . Calcium Carbonate-Vitamin D (OS-CAL 500 + D PO) Take by mouth daily.        . Cholecalciferol (VITAMIN D PO) Take 1,000 mg by mouth daily. Vitamin D3      . tamoxifen (NOLVADEX) 10 MG tablet Take 2 tablets (20 mg total) by mouth daily.  30 tablet  11  . valsartan (DIOVAN) 160 MG tablet Take 1 tablet (160 mg total) by mouth daily.  30 tablet  5  . DISCONTD: tamoxifen (NOLVADEX) 10 MG tablet Take 2 tablets (20 mg total) by mouth daily.  30 tablet  0    Allergies: No Known Allergies  Physical Exam: Filed Vitals:   01/21/11 1327  BP: 149/68  Pulse: 75  Temp: 98.1 F (36.7 C)   HEENT:  Sclerae anicteric, conjunctivae pink.  Oropharynx clear.  No mucositis or candidiasis.   Nodes:  No cervical, supraclavicular, or axillary lymphadenopathy palpated.  Breast Exam:  the right breast lumpectomy scar is benign without any dominant mass effect. There is no evidence of nipple inversion or discharge. Axilla is clear. The left breast was examined, it is free of masses skin changes nipple inversion or discharge axilla is  clear.   Lungs:  Clear to auscultation bilaterally.  No crackles, rhonchi, or wheezes.   Heart:  Regular rate and rhythm.   Abdomen:  Soft, nontender.  Positive bowel sounds.  No organomegaly or masses palpated.   Musculoskeletal:  No focal spinal tenderness to palpation.  Extremities:  Benign.  No peripheral edema or cyanosis.   Skin:  Benign.   Neuro:  Nonfocal, alert and  oriented x 3.   Lab Results: Lab Results  Component Value Date   WBC 8.1 01/10/2011   HGB 12.9 01/10/2011   HCT 37.4 01/10/2011   MCV 99.4 01/10/2011   PLT 259 01/10/2011   NEUTROABS 5.9 01/10/2011     Chemistry      Component Value Date/Time   NA 133* 01/10/2011 1458   K 4.3 01/10/2011 1458   CL 96 01/10/2011 1458   CO2 30 01/10/2011 1458   BUN 16 01/10/2011 1458   CREATININE 0.80 01/10/2011 1458      Component Value Date/Time   CALCIUM 9.2 01/10/2011 1458   ALKPHOS 54 01/10/2011 1458   AST 19 01/10/2011 1458   ALT 15 01/10/2011 1458   BILITOT 0.2* 01/10/2011 1458      Assessment:    Patient is an 78 year old [place] woman with a history of a T2 N1, ER PR HER-2 positive right breast carcinoma for which she underwent a right lumpectomy with sentinel node dissection December 2009. She completed radiation therapy to the remaining right breast tissue in March 2010. She was placed on Femara 2.5 mg by mouth daily which she took until November of 2011, 2 to decreasing bone density she was switched to tamoxifen 20 mg by mouth daily which she is continuing to tolerate quite well.  Cases been reviewed with Dr. Pierce Crane.   Plan:     Cynthia Watkins will continue on her tamoxifen 20 mg by mouth daily. Refill prescription was e-prescribed.  We will plan on seeing her back in 6 months time with appropriate pre-labs the week before to include, CBC, serum chemistries, LDH, CA 27-29, and vitamin D level. Patient knows she can contact us prior to her six-month followup if the need should arise.    This plan was reviewed with the patient, who voices understanding and agreement.  She knows to call with any changes or problems.    Kaileen Bronkema T, PA-C 01/21/2011

## 2011-02-24 ENCOUNTER — Other Ambulatory Visit: Payer: Self-pay | Admitting: *Deleted

## 2011-02-24 DIAGNOSIS — C50519 Malignant neoplasm of lower-outer quadrant of unspecified female breast: Secondary | ICD-10-CM

## 2011-02-24 MED ORDER — TAMOXIFEN CITRATE 20 MG PO TABS: 20.0000 mg | ORAL_TABLET | Freq: Every day | ORAL | Status: DC

## 2011-04-10 ENCOUNTER — Other Ambulatory Visit: Payer: Self-pay

## 2011-04-10 ENCOUNTER — Other Ambulatory Visit: Payer: Self-pay | Admitting: Cardiology

## 2011-04-10 MED ORDER — VALSARTAN 160 MG PO TABS: 160.0000 mg | ORAL_TABLET | Freq: Every day | ORAL | Status: DC

## 2011-06-09 DIAGNOSIS — J069 Acute upper respiratory infection, unspecified: Secondary | ICD-10-CM

## 2011-06-09 HISTORY — DX: Acute upper respiratory infection, unspecified: J06.9

## 2011-07-22 ENCOUNTER — Ambulatory Visit (HOSPITAL_BASED_OUTPATIENT_CLINIC_OR_DEPARTMENT_OTHER): Payer: Medicare Other | Admitting: Physician Assistant

## 2011-07-22 ENCOUNTER — Encounter: Payer: Self-pay | Admitting: Physician Assistant

## 2011-07-22 ENCOUNTER — Other Ambulatory Visit (HOSPITAL_BASED_OUTPATIENT_CLINIC_OR_DEPARTMENT_OTHER): Payer: Medicare Other | Admitting: Lab

## 2011-07-22 ENCOUNTER — Other Ambulatory Visit: Payer: Self-pay | Admitting: *Deleted

## 2011-07-22 ENCOUNTER — Telehealth: Payer: Self-pay | Admitting: Oncology

## 2011-07-22 VITALS — BP 101/63 | HR 75 | Temp 98.1°F | Ht 61.0 in | Wt 106.0 lb

## 2011-07-22 DIAGNOSIS — E559 Vitamin D deficiency, unspecified: Secondary | ICD-10-CM

## 2011-07-22 DIAGNOSIS — C50919 Malignant neoplasm of unspecified site of unspecified female breast: Secondary | ICD-10-CM

## 2011-07-22 DIAGNOSIS — C50911 Malignant neoplasm of unspecified site of right female breast: Secondary | ICD-10-CM

## 2011-07-22 DIAGNOSIS — C50519 Malignant neoplasm of lower-outer quadrant of unspecified female breast: Secondary | ICD-10-CM

## 2011-07-22 LAB — COMPREHENSIVE METABOLIC PANEL
Albumin: 4 g/dL (ref 3.5–5.2)
Alkaline Phosphatase: 39 U/L (ref 39–117)
BUN: 19 mg/dL (ref 6–23)
Glucose, Bld: 93 mg/dL (ref 70–99)
Potassium: 4.7 mEq/L (ref 3.5–5.3)
Total Bilirubin: 0.4 mg/dL (ref 0.3–1.2)

## 2011-07-22 LAB — CBC WITH DIFFERENTIAL/PLATELET
Basophils Absolute: 0 10*3/uL (ref 0.0–0.1)
Eosinophils Absolute: 0 10*3/uL (ref 0.0–0.5)
HGB: 12.2 g/dL (ref 11.6–15.9)
LYMPH%: 27.3 % (ref 14.0–49.7)
MCV: 101 fL (ref 79.5–101.0)
MONO%: 8.4 % (ref 0.0–14.0)
NEUT#: 4.6 10*3/uL (ref 1.5–6.5)
Platelets: 255 10*3/uL (ref 145–400)

## 2011-07-22 LAB — CANCER ANTIGEN 27.29: CA 27.29: 19 U/mL (ref 0–39)

## 2011-07-22 LAB — LACTATE DEHYDROGENASE: LDH: 155 U/L (ref 94–250)

## 2011-07-22 NOTE — Telephone Encounter (Signed)
gve the pt her jan 2014 appt calendar °

## 2011-07-24 NOTE — Progress Notes (Signed)
Hematology and Oncology Follow Up Visit  Cynthia Watkins [id number] 03/09/1924 78 y.o. 07/22/11    HPI: Cynthia Watkins is an 78 year old [place] woman with a history of a T2 N1, ER/PR positive, HER-2 positive breast cancer, status post right lumpectomy with sentinel lymph node dissection in December 2009 followed by radiation therapy completed in March 2010. She utilize Femara 2.5 mg by mouth daily from 03/2008 through 11/2009. Currently on tamoxifen.   Interim History:     Cynthia Watkins is seen today pertain to her history of a T2, N1, ER/PR/HER-2 positive right breast carcinoma for which she underwent a right lumpectomy with sentinel node dissection in December of 2009 followed by radiation therapy. She was on Femara between March of 2010 and in November of 2011 at which time she was switched to tamoxifen due to decreasing bone density.  Since her last office visit on 01/21/11, she has continued to do quite well. She denies any fevers chills night sweats shortness of breath chest pain. She denies any nausea emesis diarrhea or constipation issues.  She denies any calf tenderness or palpable venous cords. She denies any vaginal bleeding or unusual discharge. She continues to walk daily and at least one hour. She also does strengthening and stretching exercises. Her appetite has been excellent. She has not experienced any unexplained weight loss. She denies any palpable breast masses. A detailed review of systems is otherwise noncontributory as noted below.  Review of Systems: Constitutional:  no weight loss, fever, night sweats and feels well Eyes: uses glasses ENT:No complaints Cardiovascular: no chest pain or dyspnea on exertion Respiratory: no cough, shortness of breath, or wheezing Neurological: no TIA or stroke symptoms Dermatological: negative Gastrointestinal: no abdominal pain, change in bowel habits, or black or bloody stools Genito-Urinary: no dysuria, trouble voiding, or  hematuria Hematological and Lymphatic: negative Breast: negative for breast lumps Musculoskeletal: negative Remaining ROS negative.   Medications:   I have reviewed the patient's current medications.  Current Outpatient Prescriptions  Medication Sig Dispense Refill  . atorvastatin (LIPITOR) 40 MG tablet Take 20 mg by mouth daily. 10 mg tablet per day.      . Calcium Carbonate-Vitamin D (OS-CAL 500 + D PO) Take by mouth daily.        . Cholecalciferol (VITAMIN D PO) Take 1,000 mg by mouth daily. Vitamin D3      . tamoxifen (NOLVADEX) 20 MG tablet Take 1 tablet (20 mg total) by mouth daily.  30 tablet  5  . valsartan (DIOVAN) 160 MG tablet Take 1 tablet (160 mg total) by mouth daily.  30 tablet  7    Allergies: No Known Allergies  Physical Exam: Filed Vitals:   07/22/11 1343  BP: 101/63  Pulse: 75  Temp: 98.1 F (36.7 C)  Weight: 106 lbs. HEENT:  Sclerae anicteric, conjunctivae pink.  Oropharynx clear.  No mucositis or candidiasis.   Nodes:  No cervical, supraclavicular, or axillary lymphadenopathy palpated.  Breast Exam:  the right breast lumpectomy scar is benign without any dominant mass effect. There is no evidence of nipple inversion or discharge. Axilla is clear. The left breast was examined, it is free of masses skin changes nipple inversion or discharge axilla is clear.   Lungs:  Clear to auscultation bilaterally.  No crackles, rhonchi, or wheezes.   Heart:  Regular rate and rhythm.   Abdomen:  Soft, nontender.  Positive bowel sounds.  No organomegaly or masses palpated.   Musculoskeletal:  No focal spinal tenderness to  palpation.  Extremities:  Benign.  No peripheral edema or cyanosis.   Skin:  Benign.   Neuro:  Nonfocal, alert and oriented x 3.   Lab Results: Lab Results  Component Value Date   WBC 7.3 07/22/2011   HGB 12.2 07/22/2011   HCT 36.6 07/22/2011   MCV 101.0 07/22/2011   PLT 255 07/22/2011   NEUTROABS 4.6 07/22/2011     Chemistry      Component Value  Date/Time   NA 133* 07/22/2011 1304   K 4.7 07/22/2011 1304   CL 98 07/22/2011 1304   CO2 31 07/22/2011 1304   BUN 19 07/22/2011 1304   CREATININE 0.80 07/22/2011 1304      Component Value Date/Time   CALCIUM 9.0 07/22/2011 1304   ALKPHOS 39 07/22/2011 1304   AST 20 07/22/2011 1304   ALT 11 07/22/2011 1304   BILITOT 0.4 07/22/2011 1304      Assessment:  Patient is an 78 year old [place] woman with a history of a T2 N1, ER PR HER-2 positive right breast carcinoma for which she underwent a right lumpectomy with sentinel node dissection December 2009. She completed radiation therapy to the remaining right breast tissue in March 2010. She was placed on Femara 2.5 mg by mouth daily which she took until November of 2011, 2 to decreasing bone density she was switched to tamoxifen 20 mg by mouth daily which she is continuing to tolerate quite well.  Case has been reviewed with Dr. Pierce Crane.   Plan:  Cynthia Watkins will continue on her tamoxifen 20 mg by mouth daily. Her annual mammogram will be scheduled by the patient at [facility] in 11/2011.  We will plan on seeing her back in 6 months time with appropriate pre-labs the week before to include, CBC, serum chemistries, LDH, CA 27-29, and vitamin D level. Patient knows she can contact us prior to her six-month followup if the need should arise.   This plan was reviewed with the patient, who voices understanding and agreement.  She knows to call with any changes or problems.    Zahlia Deshazer T, PA-C 07/22/11

## 2011-09-10 ENCOUNTER — Other Ambulatory Visit: Payer: Self-pay | Admitting: Oncology

## 2011-10-10 ENCOUNTER — Ambulatory Visit (INDEPENDENT_AMBULATORY_CARE_PROVIDER_SITE_OTHER): Payer: Medicare Other | Admitting: Cardiology

## 2011-10-10 ENCOUNTER — Encounter: Payer: Self-pay | Admitting: Cardiology

## 2011-10-10 VITALS — BP 138/78 | HR 66 | Ht 61.0 in | Wt 104.8 lb

## 2011-10-10 DIAGNOSIS — I4510 Unspecified right bundle-branch block: Secondary | ICD-10-CM

## 2011-10-10 DIAGNOSIS — E7800 Pure hypercholesterolemia, unspecified: Secondary | ICD-10-CM

## 2011-10-10 DIAGNOSIS — I10 Essential (primary) hypertension: Secondary | ICD-10-CM

## 2011-10-10 NOTE — Progress Notes (Signed)
Cynthia Watkins Date of Birth: 1924-09-02   History of Present Illness: Cynthia Watkins is seen today for yearly followup. She has a history of hypertension, hyperlipidemia, and a right bundle branch block. When seen a year ago we reduced her Diovan dose because of symptoms of hypotension. She has done better. She still notes some fluctuation in her blood pressure. Typically her blood pressure is in the 130s at rest. It it drops after she exercises to 100 systolic. The highest blood pressure reading she has ever had was 152 systolic. She does report that she feels wretched when her blood pressure is low. She otherwise is doing very well and continues to walk daily.  Current Outpatient Prescriptions on File Prior to Visit  Medication Sig Dispense Refill  . atorvastatin (LIPITOR) 40 MG tablet Take 10 mg by mouth daily.       . Calcium Carbonate-Vitamin D (OS-CAL 500 + D PO) Take by mouth daily.        . Cholecalciferol (VITAMIN D PO) Take 1,000 mg by mouth daily. Vitamin D3      . tamoxifen (NOLVADEX) 20 MG tablet TAKE 1 TABLET (20 MG TOTAL) BY MOUTH DAILY.  30 tablet  4  . DISCONTD: valsartan (DIOVAN) 160 MG tablet Take 1 tablet (160 mg total) by mouth daily.  30 tablet  7    No Known Allergies  Past Medical History  Diagnosis Date  . Hypertension     Well Controlled  . Hypercholesterolemia     on lipitor  . Right bundle branch block   . Breast cancer   . Mitral valve prolapse     Past Surgical History  Procedure Date  . Tubal ligation   . Tonsillectomy   . Other surgical history     Laparoscopic surgery for adhesions    History  Smoking status  . Former Smoker  Smokeless tobacco  . Not on file    History  Alcohol Use  . Yes    Drinks One Glass per Day    History reviewed. No pertinent family history.  Review of Systems: As the history of present illness.  All other systems were reviewed and are negative.  Physical Exam: BP 138/78  Pulse 66  Ht 5\' 1"  (1.549 m)   Wt 104 lb 12.8 oz (47.537 kg)  BMI 19.80 kg/m2 She is a pleasant, elderly white female in no acute distress.The patient is alert and oriented x 3.  The mood and affect are normal.  The skin is warm and dry.  Color is normal.  The HEENT exam reveals that the sclera are nonicteric.  The mucous membranes are moist.  The carotids are 2+ without bruits.  There is no thyromegaly.  There is no JVD.  The lungs are clear.  The chest wall is non tender.  The heart exam reveals a regular rate with a normal S1 and S2.  There are no murmurs, gallops, or rubs.  The PMI is not displaced.   Abdominal exam reveals good bowel sounds.  There is no guarding or rebound.  There is no hepatosplenomegaly or tenderness.  There are no masses.  Exam of the legs reveal no clubbing, cyanosis, or edema.  The legs are without rashes.  The distal pulses are intact.  Cranial nerves II - XII are intact.  Motor and sensory functions are intact.  The gait is normal.  LABORATORY DATA: ECG today demonstrates normal sinus rhythm with left axis deviation and a right bundle branch  block.  Assessment / Plan: 1. Hypertension. I think her blood pressure control is acceptable. I think it is more important to avoid hypotension. We'll continue with Diovan 80 mg daily.  2. Hypercholesterolemia, on chronic Lipitor.  3. Right bundle branch block, chronic.

## 2011-10-10 NOTE — Patient Instructions (Signed)
Continue your current therapy  I will see you again in one year.   

## 2012-01-01 ENCOUNTER — Telehealth: Payer: Self-pay | Admitting: *Deleted

## 2012-01-01 NOTE — Telephone Encounter (Signed)
left voice message to inform the patient of the cancelled appointment 

## 2012-01-07 DIAGNOSIS — R5383 Other fatigue: Secondary | ICD-10-CM

## 2012-01-07 HISTORY — DX: Other fatigue: R53.83

## 2012-01-16 ENCOUNTER — Emergency Department (HOSPITAL_COMMUNITY): Payer: Medicare Other

## 2012-01-16 ENCOUNTER — Emergency Department (HOSPITAL_COMMUNITY)
Admission: EM | Admit: 2012-01-16 | Discharge: 2012-01-16 | Disposition: A | Discharge status: Home / Self Care | Payer: Medicare Other | Attending: Emergency Medicine | Admitting: Emergency Medicine

## 2012-01-16 ENCOUNTER — Encounter (HOSPITAL_COMMUNITY): Payer: Self-pay | Admitting: *Deleted

## 2012-01-16 DIAGNOSIS — E7800 Pure hypercholesterolemia, unspecified: Secondary | ICD-10-CM | POA: Insufficient documentation

## 2012-01-16 DIAGNOSIS — Z8679 Personal history of other diseases of the circulatory system: Secondary | ICD-10-CM | POA: Insufficient documentation

## 2012-01-16 DIAGNOSIS — Z87891 Personal history of nicotine dependence: Secondary | ICD-10-CM | POA: Insufficient documentation

## 2012-01-16 DIAGNOSIS — I10 Essential (primary) hypertension: Secondary | ICD-10-CM | POA: Insufficient documentation

## 2012-01-16 DIAGNOSIS — R509 Fever, unspecified: Secondary | ICD-10-CM | POA: Insufficient documentation

## 2012-01-16 DIAGNOSIS — Z79899 Other long term (current) drug therapy: Secondary | ICD-10-CM | POA: Insufficient documentation

## 2012-01-16 DIAGNOSIS — J069 Acute upper respiratory infection, unspecified: Secondary | ICD-10-CM | POA: Insufficient documentation

## 2012-01-16 DIAGNOSIS — J029 Acute pharyngitis, unspecified: Secondary | ICD-10-CM | POA: Insufficient documentation

## 2012-01-16 DIAGNOSIS — Z853 Personal history of malignant neoplasm of breast: Secondary | ICD-10-CM | POA: Insufficient documentation

## 2012-01-16 MED ORDER — BENZONATATE 100 MG PO CAPS: 100.0000 mg | ORAL_CAPSULE | Freq: Three times a day (TID) | ORAL | Status: DC

## 2012-01-16 MED ORDER — ACETAMINOPHEN 325 MG PO TABS
650.0000 mg | ORAL_TABLET | Freq: Once | ORAL | Status: AC
  Administered 2012-01-16: 650 mg via ORAL

## 2012-01-16 NOTE — ED Notes (Signed)
Cold cough and p[ain in her lt throat for one week with an elevated temp

## 2012-01-16 NOTE — ED Provider Notes (Signed)
History   This chart was scribed for non-physician practitioner working with Suzi Roots, MD by Frederik Pear, ED Scribe. This patient was seen in room TR11C/TR11C and the patient's care was started at 2201.   CSN: [id number]  Arrival date & time 01/16/12  2013   First MD Initiated Contact with Patient 01/16/12 2201      Chief Complaint  Patient presents with  . Cough    (Consider location/radiation/quality/duration/timing/severity/associated sxs/prior treatment) Patient is a 79 y.o. female presenting with cough. The history is provided by the patient. No language interpreter was used.  Cough This is a new problem. The current episode started more than 2 days ago. The problem occurs every few minutes. Progression since onset: Became dry from a productive cough. The cough is non-productive. The maximum temperature recorded prior to her arrival was 101 to 101.9 F. The fever has been present for 3 to 4 days. Associated symptoms include sore throat. She has tried decongestants and cough syrup for the symptoms.    NATALIN BIBLE is a 79 y.o. female who presents to the Emergency Department complaining of chest congestion and sore throat with associated difficulty swallowing that is worse on the left that began 6 days ago. She reports that she developed a fever 3 days ago that spiked at a little over 101. She also reports an associated productive cough that became dry today. She states that she took Mucinex, Tussinex, and ibuprofen at home with temporary relief. She reports that her husband has been sick recently with similar symptoms.  PCP is Dr. Chilton Si.  Past Medical History  Diagnosis Date  . Hypertension     Well Controlled  . Hypercholesterolemia     on lipitor  . Right bundle branch block   . Breast cancer   . Mitral valve prolapse     Past Surgical History  Procedure Date  . Tubal ligation   . Tonsillectomy   . Other surgical history     Laparoscopic surgery for adhesions     No family history on file.  History  Substance Use Topics  . Smoking status: Former Games developer  . Smokeless tobacco: Not on file  . Alcohol Use: Yes     Comment: Drinks One Glass per Day    OB History    Grav Para Term Preterm Abortions TAB SAB Ect Mult Living                  Review of Systems  Constitutional: Positive for fever.  HENT: Positive for congestion and sore throat.   Respiratory: Positive for cough.   All other systems reviewed and are negative.    Allergies  Review of patient's allergies indicates no known allergies.  Home Medications   Current Outpatient Rx  Name  Route  Sig  Dispense  Refill  . TAMOXIFEN CITRATE 20 MG PO TABS   Oral   Take 20 mg by mouth daily.         Marland Kitchen VALSARTAN 160 MG PO TABS   Oral   Take 80 mg by mouth daily.            BP 124/55  Pulse 90  Temp 99.4 F (37.4 C) (Oral)  Resp 20  SpO2 97%  Physical Exam  Nursing note and vitals reviewed. Constitutional: She is oriented to person, place, and time. She appears well-developed and well-nourished. No distress.  HENT:  Head: Normocephalic and atraumatic.  Mouth/Throat: Posterior oropharyngeal erythema present.  She has mild post oropharyngeal erythema and nasal drip.  Eyes: EOM are normal. Pupils are equal, round, and reactive to light.  Neck: Normal range of motion. Neck supple. No tracheal deviation present.  Cardiovascular: Normal rate.   Pulmonary/Chest: Effort normal. No respiratory distress.  Abdominal: Soft. She exhibits no distension.  Musculoskeletal: Normal range of motion. She exhibits no edema.  Neurological: She is alert and oriented to person, place, and time.  Skin: Skin is warm and dry.  Psychiatric: She has a normal mood and affect. Her behavior is normal.    ED Course  Procedures (including critical care time)  DIAGNOSTIC STUDIES: Oxygen Saturation is 97% on room air, adequate by my interpretation.    COORDINATION OF CARE:   22:18-  Discussed planned course of treatment with the patient, including Tylenol, a rapid strep test, and a chest X-ray, who is agreeable at this time.  Results for orders placed during the hospital encounter of 01/16/12  RAPID STREP SCREEN      Component Value Range   Streptococcus, Group A Screen (Direct) NEGATIVE  NEGATIVE     Labs Reviewed  RAPID STREP SCREEN   Dg Chest 2 View  01/16/2012  *RADIOLOGY REPORT*  Clinical Data: Cough, fever, congestion.  CHEST - 2 VIEW  Comparison: 12/23/2007  Findings: There is hyperinflation of the lungs compatible with COPD.  No focal airspace opacities or effusions.  Heart is normal size.  Biapical scarring, stable.  IMPRESSION: COPD.  No acute findings.   Original Report Authenticated By: Charlett Nose, M.D.      No diagnosis found.  Patient discussed with and seen by Dr. Denton Lank.  URI.  Cough.  Tessalon rx.   MDM    I personally performed the services described in this documentation, which was scribed in my presence. The recorded information has been reviewed and is accurate.       Jimmye Norman, NP 01/17/12 [phone]

## 2012-01-16 NOTE — ED Notes (Signed)
Patient transported to X-ray 

## 2012-01-16 NOTE — ED Notes (Signed)
Pt presents with fever, sore throat, and cough for the past week.  Pt called PCP today and they instructed her to come to ED if fever worsened.

## 2012-01-18 NOTE — ED Provider Notes (Signed)
Medical screening examination/treatment/procedure(s) were conducted as a shared visit with non-physician practitioner(s) and myself.  I personally evaluated the patient during the encounter Pt with non prod cough x 1-2 weeks. No resp distress in ed. Chest cta. No increased wob.   Suzi Roots, MD 01/18/12 [phone]

## 2012-01-27 ENCOUNTER — Ambulatory Visit: Payer: Medicare Other | Admitting: Oncology

## 2012-01-27 ENCOUNTER — Other Ambulatory Visit: Payer: Medicare Other | Admitting: Lab

## 2012-01-29 ENCOUNTER — Encounter: Payer: Self-pay | Admitting: Nurse Practitioner

## 2012-01-29 ENCOUNTER — Ambulatory Visit (INDEPENDENT_AMBULATORY_CARE_PROVIDER_SITE_OTHER): Payer: Medicare Other | Admitting: Nurse Practitioner

## 2012-01-29 VITALS — BP 120/62 | HR 88 | Ht 60.0 in | Wt 104.4 lb

## 2012-01-29 DIAGNOSIS — R011 Cardiac murmur, unspecified: Secondary | ICD-10-CM

## 2012-01-29 DIAGNOSIS — I10 Essential (primary) hypertension: Secondary | ICD-10-CM

## 2012-01-29 DIAGNOSIS — R6889 Other general symptoms and signs: Secondary | ICD-10-CM

## 2012-01-29 LAB — CBC WITH DIFFERENTIAL/PLATELET
Basophils Absolute: 0 10*3/uL (ref 0.0–0.1)
Eosinophils Absolute: 0 10*3/uL (ref 0.0–0.7)
HCT: 37.7 % (ref 36.0–46.0)
Hemoglobin: 12.8 g/dL (ref 12.0–15.0)
Lymphocytes Relative: 16.5 % (ref 12.0–46.0)
Lymphs Abs: 1.6 10*3/uL (ref 0.7–4.0)
MCHC: 33.8 g/dL (ref 30.0–36.0)
MCV: 97.8 fl (ref 78.0–100.0)
Monocytes Absolute: 0.8 10*3/uL (ref 0.1–1.0)
Neutro Abs: 7.1 10*3/uL (ref 1.4–7.7)
RDW: 13.4 % (ref 11.5–14.6)

## 2012-01-29 LAB — BASIC METABOLIC PANEL
CO2: 29 mEq/L (ref 19–32)
Calcium: 9.1 mg/dL (ref 8.4–10.5)
Glucose, Bld: 92 mg/dL (ref 70–99)
Sodium: 130 mEq/L — ABNORMAL LOW (ref 135–145)

## 2012-01-29 MED ORDER — VALSARTAN 80 MG PO TABS: 40.0000 mg | ORAL_TABLET | Freq: Every day | ORAL | Status: DC

## 2012-01-29 NOTE — Progress Notes (Signed)
Patient Name: Cynthia Watkins Date of Encounter: 01/29/2012  Primary Care Provider:  Kimber Relic, MD Primary Cardiologist:  P. Swaziland, MD  Patient Profile  79 year old female with history of hypertension who presents for erratic blood pressure and reduced exercise tolerance.  Problem List   Past Medical History  Diagnosis Date  . Hypertension   . Hypercholesterolemia     on lipitor  . Right bundle branch block   . Breast cancer   . Mitral valve prolapse   . Fatigue 01/2012   Past Surgical History  Procedure Date  . Tubal ligation   . Tonsillectomy   . Other surgical history     Laparoscopic surgery for adhesions    Allergies  No Known Allergies  HPI  79 year old female with the above problem list.  She is historically seen in this office secondary to hypertension.  Patient reports that since late December, she's been noting occasional lower blood pressures in the mornings, specifically after exercising, associated with fatigue and malaise.  She generally walks about one hour each morning.  She usually takes 80 mg of Diovan every evening in the p.m. And many mornings in the past few weeks sometime between 9:30 and 10 AM, in the setting of fatigue and malaise, her blood pressure has been running in the 80s to 90s.  There have been some evenings where she held her blood pressure medication because of low pressures earlier in the day and this has resulted in very high blood pressures by evening time.  She also reports that her exercise tolerance overall in the past few weeks, has been reduced in that she has been fatigued more easily than usual.  She denies chest pain, palpitations, dyspnea, pnd, orthopnea, n, v, dizziness, syncope, edema, weight gain, or early satiety.  Home Medications  Prior to Admission medications   Medication Sig Start Date End Date Taking? Authorizing Provider  benzonatate (TESSALON) 100 MG capsule Take 1 capsule (100 mg total) by mouth every 8  (eight) hours. 01/16/12  Yes Jimmye Norman, NP  FLUVIRIN INJ injection  10/24/11  Yes Historical Provider, MD  tamoxifen (NOLVADEX) 20 MG tablet Take 20 mg by mouth daily.   Yes Historical Provider, MD  valsartan (DIOVAN) 80 MG tablet Take 0.5 tablets (40 mg total) by mouth daily. 01/29/12   Ok Anis, NP   Review of Systems  As above, she has been experiencing reduced exercise tolerance over the past month.  She denies chest pain, palpitations, dyspnea, pnd, orthopnea, n, v, dizziness, syncope, edema, weight gain, or early satiety. All other systems reviewed and are otherwise negative except as noted above.  Physical Exam  Blood pressure 120/62, pulse 88, height 5' (1.524 m), weight 104 lb 6.4 oz (47.356 kg).  General: Pleasant, NAD Psych: Normal affect. Neuro: Alert and oriented X 3. Moves all extremities spontaneously. HEENT: Normal  Neck: Supple without bruits or JVD. Lungs:  Resp regular and unlabored, CTA. Heart: RRR no s3, s4, soft syst murmur @ RUSB. Abdomen: Soft, non-tender, non-distended, BS + x 4.  Extremities: No clubbing, cyanosis or edema. DP/PT/Radials 2+ and equal bilaterally.  Accessory Clinical Findings  ECG - rsr, 72, rbbb, RAE, no acute st/t changes.  Assessment & Plan  1.  Reduced exercise tolerance: Patient presents with a several week history of reduced exercise tolerance and also relative hypotension following exercise.  She has not had chest pain or dyspnea.  We will obtain blood work today including a CBC, basic metabolic  panel, and TSH.  She does have a soft systolic murmur and history of mitral valve prolapse, we will obtain a 2-D echocardiogram.  I certainly question of her reduction in exercise tolerance could be an anginal equivalent and as such I've ordered an exercise Myoview.  She does feel that she be able to walk on a treadmill since she has been walking an hour each day.  2.  Hypertension:she brought a list of her recent blood pressures  today.  It appears that she often runs in the 120s to 130s throughout the day and evening but at about 10 AM each morning, she gets into the 90s.  We have reduced her Diovan to 40 mg daily and she'll continue to follow her blood pressures at home.  We'll see if this makes any difference in the way she feels.  She may ultimately need to go back to 80 mg daily.  3.  Disposition: She will followup with Dr. Swaziland in a few weeks following testing.  Nicolasa Ducking, NP 01/29/2012, 12:57 PM

## 2012-01-29 NOTE — Patient Instructions (Addendum)
Your physician recommends that you schedule a follow-up appointment in: 3 weeks Dr Swaziland   Your physician has requested that you have en exercise stress myoview. For further information please visit [email or web address] Please follow instruction sheet, as given.    Your physician has requested that you have an echocardiogram. Echocardiography is a painless test that uses sound waves to create images of your heart. It provides your doctor with information about the size and shape of your heart and how well your heart's chambers and valves are working. This procedure takes approximately one hour. There are no restrictions for this procedure.   Your physician recommends that you return for lab work today: CBC/BMP/TSH  Your physician has recommended you make the following change in your medication:  1) Decrease your Diovan to 40mg  daily---Will Call in an 80mg  tablet take 1/2 tablet daily

## 2012-02-02 ENCOUNTER — Telehealth: Payer: Self-pay | Admitting: Oncology

## 2012-02-02 NOTE — Telephone Encounter (Signed)
Returned pt's call and gv pt appt for 2/11 @ 3pm for lb/LL. Pt requested that she be reassigned to LL. Former pt of [facility].

## 2012-02-03 ENCOUNTER — Ambulatory Visit (HOSPITAL_COMMUNITY): Payer: Medicare Other | Attending: Internal Medicine

## 2012-02-03 ENCOUNTER — Other Ambulatory Visit: Payer: Self-pay | Admitting: Oncology

## 2012-02-03 DIAGNOSIS — I379 Nonrheumatic pulmonary valve disorder, unspecified: Secondary | ICD-10-CM | POA: Insufficient documentation

## 2012-02-03 DIAGNOSIS — I080 Rheumatic disorders of both mitral and aortic valves: Secondary | ICD-10-CM | POA: Insufficient documentation

## 2012-02-03 DIAGNOSIS — I079 Rheumatic tricuspid valve disease, unspecified: Secondary | ICD-10-CM | POA: Insufficient documentation

## 2012-02-03 DIAGNOSIS — R011 Cardiac murmur, unspecified: Secondary | ICD-10-CM

## 2012-02-03 DIAGNOSIS — R6889 Other general symptoms and signs: Secondary | ICD-10-CM

## 2012-02-03 DIAGNOSIS — I10 Essential (primary) hypertension: Secondary | ICD-10-CM

## 2012-02-03 DIAGNOSIS — E785 Hyperlipidemia, unspecified: Secondary | ICD-10-CM | POA: Insufficient documentation

## 2012-02-03 NOTE — Progress Notes (Signed)
Echocardiogram performed.  

## 2012-02-04 ENCOUNTER — Other Ambulatory Visit: Payer: Self-pay

## 2012-02-04 DIAGNOSIS — R011 Cardiac murmur, unspecified: Secondary | ICD-10-CM

## 2012-02-04 DIAGNOSIS — R5383 Other fatigue: Secondary | ICD-10-CM

## 2012-02-04 DIAGNOSIS — I341 Nonrheumatic mitral (valve) prolapse: Secondary | ICD-10-CM

## 2012-02-04 DIAGNOSIS — I4510 Unspecified right bundle-branch block: Secondary | ICD-10-CM

## 2012-02-05 ENCOUNTER — Encounter (HOSPITAL_COMMUNITY): Payer: Medicare Other

## 2012-02-11 ENCOUNTER — Ambulatory Visit (HOSPITAL_COMMUNITY): Payer: Medicare Other | Attending: Cardiology

## 2012-02-11 ENCOUNTER — Encounter: Payer: Self-pay | Admitting: Cardiology

## 2012-02-11 DIAGNOSIS — I341 Nonrheumatic mitral (valve) prolapse: Secondary | ICD-10-CM

## 2012-02-11 DIAGNOSIS — R5383 Other fatigue: Secondary | ICD-10-CM

## 2012-02-11 DIAGNOSIS — R0989 Other specified symptoms and signs involving the circulatory and respiratory systems: Secondary | ICD-10-CM

## 2012-02-11 DIAGNOSIS — R011 Cardiac murmur, unspecified: Secondary | ICD-10-CM

## 2012-02-11 DIAGNOSIS — R0609 Other forms of dyspnea: Secondary | ICD-10-CM | POA: Insufficient documentation

## 2012-02-11 DIAGNOSIS — I4510 Unspecified right bundle-branch block: Secondary | ICD-10-CM | POA: Insufficient documentation

## 2012-02-11 DIAGNOSIS — I10 Essential (primary) hypertension: Secondary | ICD-10-CM | POA: Insufficient documentation

## 2012-02-11 DIAGNOSIS — R0602 Shortness of breath: Secondary | ICD-10-CM

## 2012-02-11 DIAGNOSIS — E7800 Pure hypercholesterolemia, unspecified: Secondary | ICD-10-CM | POA: Insufficient documentation

## 2012-02-11 NOTE — Progress Notes (Signed)
Echocardiogram performed.  

## 2012-02-17 ENCOUNTER — Telehealth: Payer: Self-pay | Admitting: Oncology

## 2012-02-17 ENCOUNTER — Other Ambulatory Visit (HOSPITAL_BASED_OUTPATIENT_CLINIC_OR_DEPARTMENT_OTHER): Payer: Medicare Other | Admitting: Lab

## 2012-02-17 ENCOUNTER — Ambulatory Visit (HOSPITAL_BASED_OUTPATIENT_CLINIC_OR_DEPARTMENT_OTHER): Payer: Medicare Other | Admitting: Oncology

## 2012-02-17 VITALS — BP 123/66 | HR 79 | Temp 98.3°F | Resp 18 | Ht 60.0 in | Wt 104.3 lb

## 2012-02-17 DIAGNOSIS — C50519 Malignant neoplasm of lower-outer quadrant of unspecified female breast: Secondary | ICD-10-CM

## 2012-02-17 DIAGNOSIS — C50919 Malignant neoplasm of unspecified site of unspecified female breast: Secondary | ICD-10-CM

## 2012-02-17 DIAGNOSIS — M810 Age-related osteoporosis without current pathological fracture: Secondary | ICD-10-CM

## 2012-02-17 DIAGNOSIS — E559 Vitamin D deficiency, unspecified: Secondary | ICD-10-CM

## 2012-02-17 DIAGNOSIS — C50911 Malignant neoplasm of unspecified site of right female breast: Secondary | ICD-10-CM

## 2012-02-17 DIAGNOSIS — Z170 Estrogen receptor positive status [ER+]: Secondary | ICD-10-CM

## 2012-02-17 LAB — COMPREHENSIVE METABOLIC PANEL (CC13)
ALT: 12 U/L (ref 0–55)
Alkaline Phosphatase: 68 U/L (ref 40–150)
Creatinine: 0.8 mg/dL (ref 0.6–1.1)
Glucose: 134 mg/dl — ABNORMAL HIGH (ref 70–99)
Sodium: 132 mEq/L — ABNORMAL LOW (ref 136–145)
Total Bilirubin: 0.22 mg/dL (ref 0.20–1.20)
Total Protein: 6.5 g/dL (ref 6.4–8.3)

## 2012-02-17 LAB — CBC WITH DIFFERENTIAL/PLATELET
EOS%: 0.9 % (ref 0.0–7.0)
LYMPH%: 23.5 % (ref 14.0–49.7)
MCH: 33.5 pg (ref 25.1–34.0)
MCV: 97.8 fL (ref 79.5–101.0)
MONO%: 8.2 % (ref 0.0–14.0)
RBC: 3.62 10*6/uL — ABNORMAL LOW (ref 3.70–5.45)
RDW: 13.1 % (ref 11.2–14.5)

## 2012-02-17 NOTE — Patient Instructions (Signed)
Call if needed prior to scheduled appointment  [phone]

## 2012-02-17 NOTE — Telephone Encounter (Signed)
Gave pt appt for lab and MD August 2014

## 2012-02-18 ENCOUNTER — Ambulatory Visit (INDEPENDENT_AMBULATORY_CARE_PROVIDER_SITE_OTHER): Payer: Medicare Other | Admitting: Cardiology

## 2012-02-18 ENCOUNTER — Encounter: Payer: Self-pay | Admitting: Cardiology

## 2012-02-18 VITALS — BP 118/72 | HR 66 | Ht 61.0 in | Wt 104.2 lb

## 2012-02-18 DIAGNOSIS — I10 Essential (primary) hypertension: Secondary | ICD-10-CM

## 2012-02-18 DIAGNOSIS — E7800 Pure hypercholesterolemia, unspecified: Secondary | ICD-10-CM

## 2012-02-18 DIAGNOSIS — I4510 Unspecified right bundle-branch block: Secondary | ICD-10-CM

## 2012-02-18 LAB — CANCER ANTIGEN 27.29: CA 27.29: 33 U/mL (ref 0–39)

## 2012-02-18 NOTE — Patient Instructions (Signed)
Continue your current therapy  I will see you again in 6 months.   

## 2012-02-18 NOTE — Progress Notes (Signed)
   Cynthia Watkins Date of Birth: 02/20/1924   History of Present Illness: Cynthia Watkins is seen today for  followup. She has a history of hypertension, hyperlipidemia, and a right bundle branch block. She was seen last month with symptoms of decreased exercise tolerance. She had a stress echo which was normal. Her blood pressure has been doing very well on low dose Diovan. She states she did have a bad chest cold earlier in the year but this has since resolved.  Current Outpatient Prescriptions on File Prior to Visit  Medication Sig Dispense Refill  . tamoxifen (NOLVADEX) 20 MG tablet Take 20 mg by mouth daily.      . valsartan (DIOVAN) 40 MG tablet Take 20 mg by mouth daily.       No current facility-administered medications on file prior to visit.    No Known Allergies  Past Medical History  Diagnosis Date  . Hypertension   . Hypercholesterolemia     on lipitor  . Right bundle branch block   . Breast cancer   . Mitral valve prolapse   . Fatigue 01/2012    Past Surgical History  Procedure Laterality Date  . Tubal ligation    . Tonsillectomy    . Other surgical history      Laparoscopic surgery for adhesions    History  Smoking status  . Former Smoker  Smokeless tobacco  . Not on file    History  Alcohol Use  . Yes    Comment: Drinks One Glass per Day    History reviewed. No pertinent family history.  Review of Systems: As the history of present illness.  All other systems were reviewed and are negative.  Physical Exam: BP 118/72  Pulse 66  Ht 5\' 1"  (1.549 m)  Wt 104 lb 3.2 oz (47.265 kg)  BMI 19.7 kg/m2 She is a pleasant, elderly white female in no acute distress.The patient is alert and oriented x 3.  The mood and affect are normal.  The skin is warm and dry.  Color is normal.  The HEENT exam is normal. The carotids are 2+ without bruits.  There is no thyromegaly.  There is no JVD.  The lungs are clear.    The heart exam reveals a regular rate with a normal  S1 and S2.  There are no murmurs, gallops, or rubs.  The PMI is not displaced.   Abdominal exam reveals good bowel sounds.  There is no guarding or rebound.  There is no hepatosplenomegaly or tenderness.  There are no masses.  Exam of the legs reveal no clubbing, cyanosis, or edema.  The legs are without rashes.  The distal pulses are intact.  Cranial nerves II - XII are intact.  Motor and sensory functions are intact.  The gait is normal.  LABORATORY DATA:   Assessment / Plan: 1. Hypertension. I think her blood pressure control is acceptable. Continue Diovan 40 mg daily.  2. Hypercholesterolemia, on chronic Lipitor.  3. Right bundle branch block, chronic.  4. Exercise intolerance. She had a normal stress echo evaluation.

## 2012-02-23 ENCOUNTER — Encounter: Payer: Self-pay | Admitting: Oncology

## 2012-02-23 DIAGNOSIS — M810 Age-related osteoporosis without current pathological fracture: Secondary | ICD-10-CM | POA: Insufficient documentation

## 2012-02-23 DIAGNOSIS — C50911 Malignant neoplasm of unspecified site of right female breast: Secondary | ICD-10-CM | POA: Insufficient documentation

## 2012-02-23 NOTE — Progress Notes (Signed)
OFFICE PROGRESS NOTE   02/17/2012  Physicians: Murray Hodgkins, P.Jordan, C.Streck, (P.Rubin, [name])  INTERVAL HISTORY:  Patient is seen, together with husband Dr Annitta Jersey, as she is transferring medical oncology care to myself, having been followed previously at our office by Dr Pierce Crane for breast cancer. She continues tamoxifen.  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 Preoperative MRI showed solitary mass. She had lumpectomy 12-27-2007 with 1.5 cm grade 3 invasive ductal carcinoma, no axillary nodes evaluated. She had RT to breast and axilla by Dr [name]. Patient was not inclined to pursue adjuvant chemotherapy or herceptin at diagnosis. She was on Femara from 03/2008 thru 11/2009 when this was changed to tamoxifen due to osteoporosis. She has tolerated the tamoxifen without difficulty. She has not had hysterectomy. Last mammograms were at [facility] 11-21-2011 with scattered fibroglandular densities and no mammographic findings of concern. She had unremarkable CXR with ED evaluation for infectious cough in Jan 2014.  Review of Systems: Patient has been doing well since complete resolution of the lower respiratory symptoms in Jan. She has noticed no changes in breasts, no problems with tamoxifen, no bleeding, no new or different pain. She walks outdoors an hour daily and exercises at gym several days weekly. She will see Dr Art Chilton Si for routine 5 month visit upcoming, sees Dr Peter Swaziland yearly (scheduled tomorrow) and has prn follow up with Dr Jamey Ripa. No HA or other neurologic symptoms,  vision ok with glasses, no dental or thyroid symptoms, eats vegetarian diet with some fish (x 3 years started due to elevated cholesterol as she wanted to DC statin, did lose weight with diet initially, now stable). No GI symptoms.No arthritis, no  history of blood clots, no pedal edema. Remainder of 10 point Review of Systems negative.  Past Medical/Surgical History NKA Has had flu, shingles and pneumonia vaccines BTL Surgery for adhesion Lumpectomy without axillary node evaluation 2009 Elevated cholesterol, RBBB, mitral valve prolapse Colonoscopy done since she moved to [facility] Last bone density scan at [facility] 11/2011 with lowest T scores in LS  -3.3 Bilateral mammograms at [facility] 11-21-11  Social History:  Lived in [place] x ~ 30 years, husband Transport planner there. Moved to [place] 14 years ago, lives with husband at [facility]. She has PhD in history from [place]. 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 4 children, one of whom died in drowning. Son Jonny Ruiz is urologist in [place]. 7 grandchildren. No tobacco, occasional wine.  Family History: father with colon cancer, 2 paternal aunts with breast cancer. Mother died at 62, osteoporosis. Sister with breast cancer in mid60s.  Objective:  Vital signs in last 24 hours:  BP 123/66  Pulse 79  Temp(Src) 98.3 F (36.8 C) (Oral)  Resp 18  Ht 5' (1.524 m)  Wt 104 lb 4.8 oz (47.31 kg)  BMI 20.37 kg/m2 Weight is down ~ 2 lbs from July. Easily ambulatory, very pleasant, good historian, looks comfortable. Husband also delightful.   HEENT:PERRLA, extra ocular movement intact, sclera clear, anicteric, oropharynx clear, no lesions and neck supple with midline trachea LymphaticsCervical, supraclavicular, and axillary nodes normal. Resp: clear to auscultation bilaterally and normal percussion bilaterally Cardio: regular rate and rhythm GI: soft, non-tender; bowel sounds normal; no masses,  no organomegaly Extremities: extremities normal, atraumatic, no  cyanosis or edema Neuro:nonfocal Breasts: right with well healed lumpectomy scar at 6:00, no dominant mass, no skin or nipple findings, nothing in right  axilla, no swelling RUE. Left breast without dominant mass, skin or nipple findings. Skin without rash or ecchymosis  Lab Results:  Results for orders placed in visit on 02/17/12  VITAMIN D 25 HYDROXY      Result Value Range   Vit D, 25-Hydroxy 43  30 - 89 ng/mL  CBC WITH DIFFERENTIAL      Result Value Range   WBC 7.5  3.9 - 10.3 10e3/uL   NEUT# 5.0  1.5 - 6.5 10e3/uL   HGB 12.1  11.6 - 15.9 g/dL   HCT 19.1  47.8 - 29.5 %   Platelets 269  145 - 400 10e3/uL   MCV 97.8  79.5 - 101.0 fL   MCH 33.5  25.1 - 34.0 pg   MCHC 34.2  31.5 - 36.0 g/dL   RBC 6.21 (*) 3.08 - 6.57 10e6/uL   RDW 13.1  11.2 - 14.5 %   lymph# 1.8  0.9 - 3.3 10e3/uL   MONO# 0.6  0.1 - 0.9 10e3/uL   Eosinophils Absolute 0.1  0.0 - 0.5 10e3/uL   Basophils Absolute 0.1  0.0 - 0.1 10e3/uL   NEUT% 66.5  38.4 - 76.8 %   LYMPH% 23.5  14.0 - 49.7 %   MONO% 8.2  0.0 - 14.0 %   EOS% 0.9  0.0 - 7.0 %   BASO% 0.9  0.0 - 2.0 %  CANCER ANTIGEN 27.29      Result Value Range   CA 27.29 33  0 - 39 U/mL  COMPREHENSIVE METABOLIC PANEL (CC13)      Result Value Range   Sodium 132 (*) 136 - 145 mEq/L   Potassium 4.3  3.5 - 5.1 mEq/L   Chloride 98  98 - 107 mEq/L   CO2 30 (*) 22 - 29 mEq/L   Glucose 134 (*) 70 - 99 mg/dl   BUN 84.6  7.0 - 96.2 mg/dL   Creatinine 0.8  0.6 - 1.1 mg/dL   Total Bilirubin 9.52  0.20 - 1.20 mg/dL   Alkaline Phosphatase 68  40 - 150 U/L   AST 19  5 - 34 U/L   ALT 12  0 - 55 U/L   Total Protein 6.5  6.4 - 8.3 g/dL   Albumin 3.3 (*) 3.5 - 5.0 g/dL   Calcium 9.0  8.4 - 84.1 mg/dL  LACTATE DEHYDROGENASE (CC13)      Result Value Range   LDH 176  125 - 245 U/L   Labs except LDH resulted while patient was at office and were discussed.  VIt D in Jan 2013 was 65  Studies/Results: CHEST - 2 VIEW  Comparison: 12/23/2007  Findings: There is hyperinflation of the lungs compatible with  COPD. No focal airspace opacities or effusions. Heart is normal  size. Biapical scarring, stable.  IMPRESSION:   COPD. No acute findings  Medications: I have reviewed the patient's current medications. We did not discuss duration of tamoxifen treatment, tho I expect we will use this at least until spring 2015 if no problems, or possibly consider continuing this to ~ 5 years on the tamoxifen. Assessment/Plan:  1. T1NX right breast cancer: post lumpectomy without axillary node evaluation, local radiation and femara from 03/2008 x 20 months and continuing tamoxifen. Clinically doing well. I will see her back in 6 months or sooner if needed 2.osteoporosis,  such that aromatase inhibitor was discontinued. I believe that she is on calcium with D, tho this is not listed in EMR 3.history of elevated cholesterol, RBBB and mitral valve prolapse 4.post BTL. No hysterectomy  Patient and husband were comfortable with discussion and plan as above. >50% of visit spent in discussion including review of history as above; I also reviewed information in this EMR and previous oncology EMR, with pertinent information included above.  Reece Packer, MD

## 2012-03-05 ENCOUNTER — Other Ambulatory Visit: Payer: Self-pay | Admitting: Internal Medicine

## 2012-03-05 DIAGNOSIS — C50919 Malignant neoplasm of unspecified site of unspecified female breast: Secondary | ICD-10-CM

## 2012-04-27 ENCOUNTER — Other Ambulatory Visit: Payer: Self-pay

## 2012-04-27 MED ORDER — VALSARTAN 40 MG PO TABS: 20.0000 mg | ORAL_TABLET | Freq: Every day | ORAL | Status: DC

## 2012-05-28 ENCOUNTER — Telehealth: Payer: Self-pay

## 2012-05-28 MED ORDER — VALSARTAN 40 MG PO TABS: 40.0000 mg | ORAL_TABLET | Freq: Every day | ORAL | Status: DC

## 2012-05-28 NOTE — Telephone Encounter (Signed)
valsartan (DIOVAN) 40 MG tablet  Take 20 mg by mouth daily.    Assessment / Plan: 1. Hypertension. I think her blood pressure control is acceptable. Continue Diovan 40 mg daily.  Patient Instructions  Continue your current therapy I will see you again in 6 months Chart Reviewed By  Charna Elizabeth, LPN  on 1/61/0960  9:21 AM  Kimber Relic, MD  on 03/20/2012  3:07 PM  Previous Visit  Provider Department Encounter #  02/17/2012  4:24 PM Peter Swaziland, MD [facility] [phone]

## 2012-05-28 NOTE — Telephone Encounter (Signed)
Spoke with patient she stated she takes diovan 40 mg daily, refill sent to pharmacy.

## 2012-08-09 ENCOUNTER — Non-Acute Institutional Stay: Payer: Medicare Other | Admitting: Internal Medicine

## 2012-08-09 ENCOUNTER — Encounter: Payer: Self-pay | Admitting: Internal Medicine

## 2012-08-09 VITALS — BP 120/68 | HR 60 | Temp 98.1°F | Ht 61.0 in | Wt 108.0 lb

## 2012-08-09 DIAGNOSIS — C50919 Malignant neoplasm of unspecified site of unspecified female breast: Secondary | ICD-10-CM

## 2012-08-09 DIAGNOSIS — M810 Age-related osteoporosis without current pathological fracture: Secondary | ICD-10-CM

## 2012-08-09 DIAGNOSIS — E7800 Pure hypercholesterolemia, unspecified: Secondary | ICD-10-CM

## 2012-08-09 DIAGNOSIS — R739 Hyperglycemia, unspecified: Secondary | ICD-10-CM | POA: Insufficient documentation

## 2012-08-09 DIAGNOSIS — I10 Essential (primary) hypertension: Secondary | ICD-10-CM

## 2012-08-09 DIAGNOSIS — I4510 Unspecified right bundle-branch block: Secondary | ICD-10-CM

## 2012-08-09 DIAGNOSIS — R7309 Other abnormal glucose: Secondary | ICD-10-CM

## 2012-08-09 DIAGNOSIS — C50911 Malignant neoplasm of unspecified site of right female breast: Secondary | ICD-10-CM

## 2012-08-09 NOTE — Progress Notes (Signed)
Patient ID: Cynthia Watkins, female   DOB: [birth year], 79 y.o.   MRN: [id number] MRN: [id number] Name: Cynthia Watkins  Sex: female Age: 79 y.o. DOB: [birth year]  [facility] #: [id number] Facility/Room: [facility] Level Of Care: independent Provider: Kimber Relic Emergency Contacts: Extended Emergency Contact Information Primary Emergency Contact: Hayner,Earl Address: [address]          [place], [place] [address] [place] Home Phone: [phone] Relation: Spouse  Code Status: Living Will  Allergies: Review of patient's allergies indicates no known allergies.  Chief Complaint  Patient presents with  . Medical Managment of Chronic Issues    Comprehensive Exam: blood pressure, cholesterol, hyperglycemia    HPI: Patient is 79 y.o. female who  Past Medical History  Diagnosis Date  . Hypertension   . Hypercholesterolemia     on lipitor  . Right bundle branch block   . Breast cancer 12/11/2008  . Mitral valve prolapse   . Fatigue 01/2012  . Palpitations 08/05/2000  . Senile osteoporosis 09/28/2001  . Other abnormal blood chemistry 01/21/2010  . Acute upper respiratory infections of unspecified site 06/09/2011    Past Surgical History  Procedure Laterality Date  . Tonsillectomy    . Tubal ligation  1963  . Laparoscopic lysis of adhesions  07/15/2006    Dr. Michaell Cowing  . Other surgical history    . Breast lumpectomy Right 12/27/2007   PROCEDURES 2000 Bone Density Yolanda Bonine ) 08/2000 Bone Density Yolanda Bonine) Osteoporosis Significant  increase from past study  2003 ETT McQueen Normal  03/19/2004 Mammogram 03/19/2004 Pap Smear  03/19/2004 Bone Density  03/19/2004 Endoscopy  06/03/2005 Bone Density : osteoporosis L1-4 06/04/2005 Mammogram  07-08-2006 Mammogram -negative 11/04/07 Bone Density- osteoporosis 11/04/07- Mammogram- suspicious mass right breast 11/11/07- Ultrasound bilateral breast- mass right breast 11/24/07- MRI of breast- suspicious mass right breast 12/09  lumpectomy for breast Cancer. Radiation tx 11/07/2008- Mammogram 11/13/2009-Mammogram: Negative 11/13/2009-Bone Density: Osteoporosis  11/20/2010-Mammogram: Negative    Medication List       This list is accurate as of: 08/09/12  4:31 PM.  Always use your most recent med list.               cholecalciferol 1000 UNITS tablet  Commonly known as:  VITAMIN D  Take 1,000 Units by mouth daily. Take one tablet daily     tamoxifen 20 MG tablet  Commonly known as:  NOLVADEX  TAKE 1 TABLET (20 MG TOTAL) BY MOUTH DAILY.     valsartan 40 MG tablet  Commonly known as:  DIOVAN  Take 1 tablet (40 mg total) by mouth daily.        Meds ordered this encounter  Medications  . cholecalciferol (VITAMIN D) 1000 UNITS tablet    Sig: Take 1,000 Units by mouth daily. Take one tablet daily    Immunization History  Administered Date(s) Administered  . Influenza Split 10/23/2011  . Pneumococcal Polysaccharide 01/06/1997    History  Substance Use Topics  . Smoking status: Former Smoker    Quit date: 08/06/1954  . Smokeless tobacco: Never Used  . 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 Tobacco Use: stopped in 1956. Alcohol:minimal Caffeine: none Exercises:exercise classes and walking Diet:unrestricted Pets in Home:none   FAMILY HISTORY FATHER: died age 73, colon cancer MOTHER;  died age 29 minus 3 days. TIA, cerebrovascular disease SIBLINGS, 2   Rose, living and well   Dixie, living with Alzheimer's  CHILDREN (4)  Lewis, died of drowning  Edward, living, bipolar  John, living, kidney stones  Clara,, living and well  Review of Systems  DATA OBTAINED: from patient GENERAL: Feels well no fevers, fatigue, appetite changes SKIN: No itching, rash or wounds EYES:  No eye pain, redness, discharge EARS: No earache, tinnitus, change in hearing NOSE: No congestion, drainage or bleeding  MOUTH/THROAT: No mouth or tooth pain, No sore throat, No difficulty chewing or swallowing  RESPIRATORY: No cough, wheezing, SOB CARDIAC: No chest pain, palpitations, lower extremity edema  GI: No abdominal pain, No N/V/D or constipation, No heartburn or reflux  GU: No dysuria, frequency or urgency, or incontinence  MUSCULOSKELETAL: No unrelieved bone/joint pain NEUROLOGIC: Awake, alert, appropriate to situation, No change in mental status. Moves all four, no focal deficits PSYCHIATRIC: No overt anxiety or sadness. Sleeps well. No behavior issue.  AMBULATION:    Filed Vitals:   08/09/12 1616  BP: 120/68  Pulse: 60  Temp: 98.1 F (36.7 C)    Physical Exam  GENERAL APPEARANCE: Alert, conversant. Appropriately groomed. No acute distress.  SKIN: No diaphoresis rash, or wounds HEAD: Normocephalic, atraumatic  EYES: Conjunctiva/lids clear. Pupils round, reactive. EOMs intact. Corrective lenses. EARS: External exam WNL, canals clear. Hearing grossly normal.  NOSE: No deformity or discharge.  MOUTH/THROAT: Lips w/o lesions. Mouth and throat normal. Tongue moist, w/o lesion.  NECK: No thyroid tenderness, enlargement or nodule  BREAST: lumpectomy scar on the right breast RESPIRATORY: Breathing is even, unlabored. Lung sounds are clear   CARDIOVASCULAR: Heart RRR no murmurs, rubs or gallops. No peripheral edema.  ARTERIAL: radial pulse 2+, DP pulse 1+  VENOUS: No varicosities. No venous stasis skin changes  GASTROINTESTINAL: Abdomen is soft, non-tender, not distended w/ normal bowel sounds. No mass, ventral or inguinal hernia. No organomegaly  Rectal was not done today; hx of anal stenosis GENITOURINARY: Bladder non tender, not distended   Pelvic not done today. Hx of atrophic vaginal mucosa and vulvae, and vaginal stenosis. Hx Nabothian cyst on cervix  2013. MUSCULOSKELETAL: No abnormal joints or musculature. Bunion  NEUROLOGIC: Oriented X3. Cranial nerves 2-12 grossly intact. Moves all extremities no tremor. Intact vibratory sensation. PSYCHIATRIC: Mood and affect appropriate to situation, no behavioral issues  Patient Active Problem List   Diagnosis Date Noted  . Senile osteoporosis 08/09/2012  . Hyperglycemia 08/09/2012  . Breast cancer, right breast 02/23/2012  . Decreased exercise tolerance 01/29/2012  . Hypertension   . Hypercholesterolemia   . Right bundle branch block     Functional assessment: independent in all ADL  LABS REVIEWED 06/05/2009  CBC: wbc 5.8, rbc 3.91, Hemoglobin 13.3 Vitamin D: 46.1 TSH: 1.580 CMP: glucose 116, BUN 12, Creatinine 0.8 Lipid: Cholesterol 171, Triglycerides 88, HDL 61, LDL 92 01/15/2010  CBC: wbc 5.4, rbc 4.09, Hemoglobin 13.3 CMP: glucose 113, BUN 12, Creatinine 0.81 Lipid: Cholesterol 176, Triglycerides 132, HDL 62, LDL 88 TSH: 2.139 Vitamin D: 55 04/15/2011  CBC: wbc 6.1, rbc 3.93, Hemoglobin 13.1 CMP: glucose 91, BUN 16, Creatinine 0.79 Lipid: Cholesterol 171, Triglycerides 117, HDL 58, LDL 90 A1C: 6.1  06/09/11 EKG; Rate 72. Sinus rhythm.R.-S transition zone in V leads displaced to the right. Incomplete right bundle branch block.Otherwise normal. 10/28/2011 BMP: glucose 105, BUN 12, Creatinine 0.85 02/26/2012 CMP: Sodium 137, Potassium 4.6, glucose 96, BUN 17, Creatinine 0.76 HgbA1c 6.1  08/03/12  CMP NORMAL   Lipids: TC 250, trig 122, HDL 72, LDL 154  Assessment and Plan Senile osteoporosis; stable. No change in treatment  Hyperglycemia: improved  Right bundle branch block: unchanged on EKG done at Dr. Elvis Coil office.  Hypertension: controlled  Hypercholesterolemia: Much higher than when she was taking atorvastatin.. She quit this drug because she read it could be related to diabetes. LDL has risen from 90 to 154 since stopping atorvastatin.HDL runs high at 72. She does not want  to resume statins. She thinks she can improve her diet.  Breast cancer, right breast: no relapse    GREEN, Lenon Curt, MD

## 2012-08-12 ENCOUNTER — Encounter: Payer: Self-pay | Admitting: Cardiology

## 2012-08-16 ENCOUNTER — Encounter: Payer: Self-pay | Admitting: Internal Medicine

## 2012-08-17 ENCOUNTER — Encounter: Payer: Self-pay | Admitting: Cardiology

## 2012-08-17 ENCOUNTER — Other Ambulatory Visit (HOSPITAL_BASED_OUTPATIENT_CLINIC_OR_DEPARTMENT_OTHER): Payer: Medicare Other | Admitting: Lab

## 2012-08-17 ENCOUNTER — Ambulatory Visit: Payer: Medicare Other | Admitting: Oncology

## 2012-08-17 ENCOUNTER — Ambulatory Visit (HOSPITAL_BASED_OUTPATIENT_CLINIC_OR_DEPARTMENT_OTHER): Payer: Medicare Other | Admitting: Hematology and Oncology

## 2012-08-17 VITALS — BP 125/66 | HR 77 | Temp 98.5°F | Resp 20 | Ht 61.0 in | Wt 107.6 lb

## 2012-08-17 DIAGNOSIS — C50911 Malignant neoplasm of unspecified site of right female breast: Secondary | ICD-10-CM

## 2012-08-17 DIAGNOSIS — C50919 Malignant neoplasm of unspecified site of unspecified female breast: Secondary | ICD-10-CM

## 2012-08-17 LAB — CBC WITH DIFFERENTIAL/PLATELET
BASO%: 0.3 % (ref 0.0–2.0)
Basophils Absolute: 0 10*3/uL (ref 0.0–0.1)
HCT: 36.8 % (ref 34.8–46.6)
HGB: 12.6 g/dL (ref 11.6–15.9)
LYMPH%: 28.6 % (ref 14.0–49.7)
MCHC: 34.3 g/dL (ref 31.5–36.0)
MONO#: 0.5 10*3/uL (ref 0.1–0.9)
NEUT%: 63.4 % (ref 38.4–76.8)
Platelets: 269 10*3/uL (ref 145–400)
WBC: 7.4 10*3/uL (ref 3.9–10.3)
lymph#: 2.1 10*3/uL (ref 0.9–3.3)

## 2012-08-17 LAB — COMPREHENSIVE METABOLIC PANEL (CC13)
ALT: 13 U/L (ref 0–55)
BUN: 14.7 mg/dL (ref 7.0–26.0)
CO2: 27 mEq/L (ref 22–29)
Calcium: 9.1 mg/dL (ref 8.4–10.4)
Chloride: 98 mEq/L (ref 98–109)
Creatinine: 0.8 mg/dL (ref 0.6–1.1)
Glucose: 124 mg/dl (ref 70–140)
Total Bilirubin: 0.39 mg/dL (ref 0.20–1.20)

## 2012-08-17 NOTE — Progress Notes (Signed)
OFFICE PROGRESS NOTE   02/17/2012  Physicians: Murray Hodgkins, P.Jordan, C.Streck, (P.Rubin, [name])  INTERVAL HISTORY:  Patient is seen, together with husband Dr Annitta Jersey,  having been followed previously at our office by Dr Pierce Crane for breast cancer. She continues tamoxifen.  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 Preoperative MRI showed solitary mass. She had lumpectomy 12-27-2007 with 1.5 cm grade 3 invasive ductal carcinoma, no axillary nodes evaluated. She had RT to breast and axilla by Dr [name]. Patient was not inclined to pursue adjuvant chemotherapy or herceptin at diagnosis. She was on Femara from 03/2008 thru 11/2009 when this was changed to tamoxifen due to osteoporosis. She has tolerated the tamoxifen without difficulty. She has not had hysterectomy. Last mammograms were at [facility] 11-21-2011 with scattered fibroglandular densities and no mammographic findings of concern. She had unremarkable CXR with ED evaluation for infectious cough in Jan 2014.  Review of Systems: Patient has been doing well since complete resolution of the lower respiratory symptoms in Jan. She has noticed no changes in breasts, no problems with tamoxifen, no bleeding, no new or different pain. She walks outdoors an hour daily and exercises at gym several days weekly. She will see Dr Art Chilton Si for routine 5 month visit upcoming, sees Dr Peter Swaziland yearly (scheduled tomorrow) and has prn follow up with Dr Jamey Ripa. No HA or other neurologic symptoms,  vision ok with glasses, no dental or thyroid symptoms, eats vegetarian diet with some fish (x 3 years started due to elevated cholesterol as she wanted to DC statin, did lose weight with diet initially, now stable). No GI symptoms.No arthritis, no history of blood clots, no pedal edema. Remainder of 10  point Review of Systems negative.  Past Medical/Surgical History NKA Has had flu, shingles and pneumonia vaccines BTL Surgery for adhesion Lumpectomy without axillary node evaluation 2009 Elevated cholesterol, RBBB, mitral valve prolapse Colonoscopy done since she moved to [facility] Last bone density scan at [facility] 11/2011 with lowest T scores in LS  -3.3 Bilateral mammograms at [facility] 11-21-11  Social History:  Lived in [place] x ~ 30 years, husband Transport planner there. Moved to [place] 14 years ago, lives with husband at [facility]. She has PhD in history from [employer]. 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 4 children, one of whom died in drowning. Son Jonny Ruiz is urologist in [place]. 7 grandchildren. No tobacco, occasional wine.  Family History: father with colon cancer, 2 paternal aunts with breast cancer. Mother died at 47, osteoporosis. Sister with breast cancer in mid60s.  Objective:  Vital signs in last 24 hours:  BP 125/66  Pulse 77  Temp(Src) 98.5 F (36.9 C) (Oral)  Resp 20  Ht 5\' 1"  (1.549 m)  Wt 107 lb 9.6 oz (48.807 kg)  BMI 20.34 kg/m2 Weight is down ~ 2 lbs from July. Easily ambulatory, very pleasant, good historian, looks comfortable. Husband also delightful.   HEENT:PERRLA, extra ocular movement intact, sclera clear, anicteric, oropharynx clear, no lesions and neck supple with midline trachea LymphaticsCervical, supraclavicular, and axillary nodes normal. Resp: clear to auscultation bilaterally and normal percussion bilaterally Cardio: regular rate and rhythm GI: soft, non-tender; bowel sounds normal; no masses,  no organomegaly Extremities: extremities normal, atraumatic, no cyanosis or edema Neuro:nonfocal Breasts: right with  well healed lumpectomy scar at 6:00, no dominant mass, no skin or nipple findings, nothing in right axilla, no swelling RUE. Left breast without dominant  mass, skin or nipple findings. Skin without rash or ecchymosis  Lab Results:  Results for orders placed in visit on 08/17/12  CBC WITH DIFFERENTIAL      Result Value Range   WBC 7.4  3.9 - 10.3 10e3/uL   NEUT# 4.7  1.5 - 6.5 10e3/uL   HGB 12.6  11.6 - 15.9 g/dL   HCT 28.4  13.2 - 44.0 %   Platelets 269  145 - 400 10e3/uL   MCV 98.6  79.5 - 101.0 fL   MCH 33.8  25.1 - 34.0 pg   MCHC 34.3  31.5 - 36.0 g/dL   RBC 1.02  7.25 - 3.66 10e6/uL   RDW 13.5  11.2 - 14.5 %   lymph# 2.1  0.9 - 3.3 10e3/uL   MONO# 0.5  0.1 - 0.9 10e3/uL   Eosinophils Absolute 0.0  0.0 - 0.5 10e3/uL   Basophils Absolute 0.0  0.0 - 0.1 10e3/uL   NEUT% 63.4  38.4 - 76.8 %   LYMPH% 28.6  14.0 - 49.7 %   MONO% 7.3  0.0 - 14.0 %   EOS% 0.4  0.0 - 7.0 %   BASO% 0.3  0.0 - 2.0 %  COMPREHENSIVE METABOLIC PANEL (CC13)      Result Value Range   Sodium 133 (*) 136 - 145 mEq/L   Potassium 4.2  3.5 - 5.1 mEq/L   Chloride 98  98 - 109 mEq/L   CO2 27  22 - 29 mEq/L   Glucose 124  70 - 140 mg/dl   BUN 44.0  7.0 - 34.7 mg/dL   Creatinine 0.8  0.6 - 1.1 mg/dL   Total Bilirubin 4.25  0.20 - 1.20 mg/dL   Alkaline Phosphatase 48  40 - 150 U/L   AST 21  5 - 34 U/L   ALT 13  0 - 55 U/L   Total Protein 6.5  6.4 - 8.3 g/dL   Albumin 3.6  3.5 - 5.0 g/dL   Calcium 9.1  8.4 - 95.6 mg/dL   Labs except LDH resulted while patient was at office and were discussed.  VIt D in Jan 2013 was 65  Studies/Results: CHEST - 2 VIEW  Comparison: 12/23/2007  Findings: There is hyperinflation of the lungs compatible with  COPD. No focal airspace opacities or effusions. Heart is normal  size. Biapical scarring, stable.  IMPRESSION:  COPD. No acute findings  Medications: I have reviewed the patient's current medications. We did not discuss duration of tamoxifen treatment, tho I expect we will use this at least until spring 2015 if no problems, or possibly consider continuing this to ~ 5 years on the tamoxifen. Assessment/Plan:  1.  T1NX right breast cancer: post lumpectomy without axillary node evaluation, local radiation and femara from 03/2008 x 20 months and continuing tamoxifen. Clinically doing well. Will see her back in 3 months and by then she should have her mammogram done. She will be seen in our breast clinic by Dr. Darnelle Catalan. 2.osteoporosis,  such that aromatase inhibitor was discontinued. I believe that she is on calcium with D, tho this is not listed in EMR 3.history of elevated cholesterol, RBBB and mitral valve prolapse 4.post BTL. No hysterectomy  Patient and husband were comfortable with discussion and plan as above. >50% of visit spent in discussion including review of history as above;  I also reviewed information in this EMR and previous oncology EMR, with pertinent information included above.  Zachery Dakins, MD

## 2012-08-19 ENCOUNTER — Ambulatory Visit (INDEPENDENT_AMBULATORY_CARE_PROVIDER_SITE_OTHER): Payer: Medicare Other | Admitting: Cardiology

## 2012-08-19 ENCOUNTER — Encounter: Payer: Self-pay | Admitting: Cardiology

## 2012-08-19 VITALS — BP 118/68 | HR 70 | Ht 61.0 in | Wt 105.4 lb

## 2012-08-19 DIAGNOSIS — E7800 Pure hypercholesterolemia, unspecified: Secondary | ICD-10-CM

## 2012-08-19 DIAGNOSIS — I10 Essential (primary) hypertension: Secondary | ICD-10-CM

## 2012-08-19 DIAGNOSIS — I4510 Unspecified right bundle-branch block: Secondary | ICD-10-CM

## 2012-08-19 NOTE — Patient Instructions (Signed)
Continue your current therapy  I will see you in 6 months.   

## 2012-08-19 NOTE — Progress Notes (Signed)
   Cynthia Watkins Date of Birth: [birth year]   History of Present Illness: Cynthia Watkins is seen today for  followup. She has a history of hypertension, hyperlipidemia, and a right bundle branch block. She reports she is feeling very well. Her energy level is good. She remains active. She denies any chest pain or shortness of breath. She states that her cholesterol level was a little bit higher on her last blood work because she found it difficult to remain on a strictly vegetarian diet.  Current Outpatient Prescriptions on File Prior to Visit  Medication Sig Dispense Refill  . cholecalciferol (VITAMIN D) 1000 UNITS tablet Take 1,000 Units by mouth daily. Take one tablet daily      . tamoxifen (NOLVADEX) 20 MG tablet TAKE 1 TABLET (20 MG TOTAL) BY MOUTH DAILY.  30 tablet  5  . valsartan (DIOVAN) 40 MG tablet Take 1 tablet (40 mg total) by mouth daily.  30 tablet  11   No current facility-administered medications on file prior to visit.    No Known Allergies  Past Medical History  Diagnosis Date  . Hypertension   . Hypercholesterolemia     on lipitor  . Right bundle branch block   . Breast cancer 12/11/2008  . Mitral valve prolapse   . Fatigue 01/2012  . Palpitations 08/05/2000  . Senile osteoporosis 09/28/2001  . Other abnormal blood chemistry 01/21/2010  . Acute upper respiratory infections of unspecified site 06/09/2011    Past Surgical History  Procedure Laterality Date  . Tonsillectomy    . Tubal ligation  1963  . Laparoscopic lysis of adhesions  07/15/2006    Dr. Michaell Cowing  . Other surgical history    . Breast lumpectomy Right 12/27/2007    History  Smoking status  . Former Smoker  . Quit date: 08/06/1954  Smokeless tobacco  . Never Used    History  Alcohol Use  . Yes    Comment: Drinks 2-3 times a week    Family History  Problem Relation Age of Onset  . Stroke Mother   . Cancer Father     colon  . Alzheimer's disease Sister     Review of Systems: As the history  of present illness.  All other systems were reviewed and are negative.  Physical Exam: BP 118/68  Pulse 70  Ht 5\' 1"  (1.549 m)  Wt 105 lb 6.4 oz (47.809 kg)  BMI 19.93 kg/m2  SpO2 96% She is a pleasant, elderly white female in no acute distress.The patient is alert and oriented x 3.    The skin is warm and dry.   The HEENT exam is normal. The carotids are 2+ without bruits.  There is no thyromegaly.  There is no JVD.  The lungs are clear.    The heart exam reveals a regular rate with a normal S1 and S2.  There are no murmurs, gallops, or rubs.  The PMI is not displaced.   Abdominal exam reveals good bowel sounds.There are no masses.  Exam of the legs reveal no clubbing, cyanosis, or edema.  The legs are without rashes.  The distal pulses are intact.  Cranial nerves II - XII are intact.  Motor and sensory functions are intact.  The gait is normal.  LABORATORY DATA:   Assessment / Plan: 1. Hypertension.blood pressure is well controlled. Continue Diovan 40 mg daily.  2. Hypercholesterolemia, on chronic Lipitor.  3. Right bundle branch block, chronic.

## 2012-08-20 ENCOUNTER — Telehealth: Payer: Self-pay | Admitting: Oncology

## 2012-09-13 ENCOUNTER — Other Ambulatory Visit: Payer: Self-pay | Admitting: Oncology

## 2012-09-13 DIAGNOSIS — C50911 Malignant neoplasm of unspecified site of right female breast: Secondary | ICD-10-CM

## 2012-09-14 ENCOUNTER — Telehealth: Payer: Self-pay | Admitting: Cardiology

## 2012-09-14 NOTE — Telephone Encounter (Signed)
New Problem  Pt takes bp meds and has recently had a spike in BP and they are concerned//  10:15pm 188/89 10:30pm 160/95  after being given lasix last night these are the readings for this morning  6:00am 142/80 8:30am 118/71  Request a call back to discuss being that they are planning a vacation.

## 2012-09-14 NOTE — Telephone Encounter (Signed)
Returned call to patient's husband he stated last night wife's B/P 188/89,160/95.Stated she took lasix 40 mg and a extra diovan 40 mg.Stated he called Dr.on call was advised just to monitor B/P.Stated this morning B/P 142/80,118/71.Stated they will be going out of town next Tuesday and wanted to make sure ok.Dr.Jordan out of office this week,will check with him on Monday 09/20/12 and call back.Advised to continue to monitor B/P.

## 2012-09-17 ENCOUNTER — Telehealth: Payer: Self-pay | Admitting: *Deleted

## 2012-09-17 NOTE — Telephone Encounter (Signed)
Patient called and stated that her BP has been running high. Scheduled an appointment for her to see Dr. Chilton Si at [facility] on Monday. Told patient if her BP runs high and any new symptoms to go to ER. Patient agreed

## 2012-09-20 ENCOUNTER — Encounter: Payer: Self-pay | Admitting: Internal Medicine

## 2012-09-20 ENCOUNTER — Non-Acute Institutional Stay: Payer: Medicare Other | Admitting: Internal Medicine

## 2012-09-20 DIAGNOSIS — I10 Essential (primary) hypertension: Secondary | ICD-10-CM

## 2012-09-20 MED ORDER — VALSARTAN 40 MG PO TABS: ORAL_TABLET | ORAL | Status: DC

## 2012-09-20 NOTE — Telephone Encounter (Signed)
Returned call to patient she stated she saw her PCP last week about her B/P.Stated she was told to play it by ear and take a extra diovan as needed.Advised to see Dr.Jordan in 2/15,continue to monitor B/P and call sooner if needed.

## 2012-09-20 NOTE — Progress Notes (Signed)
  Subjective:    Patient ID: Cynthia Watkins, female    DOB: 05-26-1924, 79 y.o.   MRN: [id number]  HPI Hypertension Had a BP to 188/88 at home. No trigger fingers observed by patient. Mild anxiety. Denies chest pain or headache. No palpitatons. No dysuria.   Current Outpatient Prescriptions on File Prior to Visit  Medication Sig Dispense Refill  . cholecalciferol (VITAMIN D) 1000 UNITS tablet Take 1,000 Units by mouth daily. Take one tablet daily      . tamoxifen (NOLVADEX) 20 MG tablet TAKE 1 TABLET (20 MG TOTAL) BY MOUTH DAILY.  30 tablet  2  . valsartan (DIOVAN) 40 MG tablet Take 1 tablet (40 mg total) by mouth daily.  30 tablet  11   No current facility-administered medications on file prior to visit.    Review of Systems GENERAL: Feels well no fevers, fatigue, appetite changes SKIN: No itching, rash or wounds EYES: No eye pain, redness, discharge EARS: No earache, tinnitus, change in hearing NOSE: No congestion, drainage or bleeding   MOUTH/THROAT: No mouth or tooth pain, No sore throat, No difficulty chewing or swallowing   RESPIRATORY: No cough, wheezing, SOB CARDIAC: No chest pain, palpitations, lower extremity edema   GI: No abdominal pain, No N/V/D or constipation, No heartburn or reflux   GU: No dysuria, frequency or urgency, or incontinence   MUSCULOSKELETAL: No unrelieved bone/joint pain NEUROLOGIC: Awake, alert, appropriate to situation, No change in mental status. Moves all four, no focal deficits PSYCHIATRIC: No overt anxiety or sadness. Sleeps well. No behavior issue.  AMBULATION     Objective:   Physical Exam GENERAL APPEARANCE: Alert, conversant. Appropriately groomed. No acute distress.   SKIN: No diaphoresis rash, or wounds HEAD: Normocephalic, atraumatic   EYES: Conjunctiva/lids clear. Pupils round, reactive. EOMs intact. Corrective lenses. EARS: External exam WNL, canals clear. Hearing grossly normal.   NOSE: No deformity or discharge.   MOUTH/THROAT:  Lips w/o lesions. Mouth and throat normal. Tongue moist, w/o lesion.   NECK: No thyroid tenderness, enlargement or nodule   BREAST: lumpectomy scar on the right breast RESPIRATORY: Breathing is even, unlabored. Lung sounds are clear    CARDIOVASCULAR: Heart RRR no murmurs, rubs or gallops. No peripheral edema.   ARTERIAL: radial pulse 2+, DP pulse 1+   VENOUS: No varicosities. No venous stasis skin changes   GASTROINTESTINAL: Abdomen is soft, non-tender, not distended w/ normal bowel sounds. No mass, ventral or inguinal hernia. No organomegaly             Rectal was not done today; hx of anal stenosis GENITOURINARY: Bladder non tender, not distended               Pelvic not done today. Hx of atrophic vaginal mucosa and vulvae, and vaginal stenosis. Hx Nabothian cyst on cervix 2013. MUSCULOSKELETAL: No abnormal joints or musculature. Bunion   NEUROLOGIC: Oriented X3. Cranial nerves 2-12 grossly intact. Moves all extremities no tremor. Intact vibratory sensation. PSYCHIATRIC: Mood and affect appropriate to situation, no behavioral issues        Assessment & Plan:  Hypertension - Plan: valsartan (DIOVAN) 40 MG tablet qd. Repeat if SBP > 150

## 2012-10-11 NOTE — Patient Instructions (Signed)
Use medication as directed. 

## 2012-10-12 ENCOUNTER — Ambulatory Visit: Payer: Medicare Other | Admitting: Cardiology

## 2012-11-12 ENCOUNTER — Non-Acute Institutional Stay: Payer: Medicare Other | Admitting: Geriatric Medicine

## 2012-11-12 ENCOUNTER — Encounter: Payer: Self-pay | Admitting: Geriatric Medicine

## 2012-11-12 DIAGNOSIS — R609 Edema, unspecified: Secondary | ICD-10-CM | POA: Insufficient documentation

## 2012-11-12 DIAGNOSIS — I10 Essential (primary) hypertension: Secondary | ICD-10-CM

## 2012-11-12 MED ORDER — VALSARTAN 40 MG PO TABS: 40.0000 mg | ORAL_TABLET | Freq: Two times a day (BID) | ORAL | Status: DC

## 2012-11-12 NOTE — Assessment & Plan Note (Signed)
New onset lotion b.i.d. likely due to combination of venous insufficiency, prolonged sitting and standing during her spouse this hospitalization, mildly elevated blood pressure.  Recommend leg elevation daily, will adjust blood pressure medication.

## 2012-11-12 NOTE — Assessment & Plan Note (Signed)
Valsartan was started July 2014 and 40 mg daily. Patient with mildly elevated blood pressure, some dizziness when lying down. Will increase valsartan to 40 mg BID. Patient will continue to check her blood pressure intermittently at home. Recommend repeat BMP next week,  followup with Dr. Chilton Si in about 10 days

## 2012-11-12 NOTE — Progress Notes (Addendum)
Patient ID: Cynthia Watkins, female   DOB: 08-Mar-1924, 79 y.o.   MRN: [id number] [facility] [phone])  Code Status: Full Code  Contact Information   Name Relation Home Work Leary Son [phone]  [phone]   Paizley, Ramella [phone]       Chief Complaint  Patient presents with  . Leg Swelling    HPI: This is a 79 y.o. female resident of [facility]  section.  Evaluation is requested today due to bilateral leg swelling.  Patient's son noticed leg swelling yesterday, recommended she elevate her legs. Swelling is mildly improved today. Patient has been monitoring her blood pressure intermittently since starting valsartan in July 2014. She reports that the top number fluctuates, frequently greater than 140 sometimes into the 150s. She has taken a second dose of valsartan just a couple of times, the most recent was  this morning. Her systolic blood pressure was 152, she took a second valsartan and when reassessed in the clinic her blood pressure is 130/76. Patient does report some dizziness at night, and also when lying down yesterday. No headache no shortness of breath.  This patient's spouse died this week after extended hospitalization and ICU care.   No Known Allergies Medications Reviewed  DATA REVIEWED  Radiologic Exams:   Cardiovascular Exams:   Laboratory Studies  08/03/2012: Glucose 93, BUN 15, creatinine 0.80, sodium 137, potassium 4.4. Protein/LFTs WNL    Total cholesterol 250, triglyceride 122, HDL 72, LDL 154  A1c 5.6  Review of Systems  DATA OBTAINED: from patient, family member (son) GENERAL: Feels OK, a bit tired.   No fevers, change in appetite or weight MOUTH/THROAT: No mouth or tooth pain  No sore throat No difficulty chewing or swallowing RESPIRATORY: No cough, wheezing, SOB CARDIAC: No chest pain, palpitations  Edema present, new GI: No abdominal pain   No N/V/D or constipation  No heartburn or reflux  MUSCULOSKELETAL: No joint pain, swelling or stiffness  No back pain  No muscle ache, pain, weakness  Gait is steady  No recent falls.  NEUROLOGIC:  Occ. Dizziness when lying down, No fainting, headache,   No change in mental status.  PSYCHIATRIC: No feelings of anxiety, depression, acknowledges stress/sadness re: spouse's illness and death.  Sleeping OK.      Physical Exam Filed Vitals:   11/12/12 1153  BP: 130/76  Pulse: 70  Temp: 96.8 F (36 C)  Resp: 17  Weight: 101 lb 6.4 oz (45.995 kg)  SpO2: 98%   Body mass index is 19.17 kg/(m^2). GENERAL APPEARANCE: No acute distress, appropriately groomed, Thin body habitus. Alert, pleasant, conversant. SKIN: No diaphoresis, rash, unusual lesions, wounds HEAD: Normocephalic, atraumatic EYES: Conjunctiva/lids clear. Marland Kitchen  EARS:  Hearing grossly normal. NOSE: No deformity or discharge. MOUTH/THROAT: Lips w/o lesions. Oral mucosa, tongue moist, w/o lesion. Oropharynx w/o redness or lesions.  NECK: Supple, full ROM. No thyroid tenderness, enlargement or nodule LYMPHATICS: No head, neck or supraclavicular adenopathy RESPIRATORY: Breathing is even, unlabored. Lung sounds are clear and full.  CARDIOVASCULAR: Heart RRR. No murmur or extra heart sounds  VENOUS: Bilateral LE varicosities. Mild venous stasis skin changes  EDEMA: Trace bil. LE edema   MUSCULOSKELETAL: Moves all extremities with full ROM, strength and tone. Gait is steady NEUROLOGIC: Oriented to time, place, person. Speech clear, no tremor.  PSYCHIATRIC: Mood and affect appropriate to situation  ASSESSMENT/PLAN    Follow up:   Nikeria Kalman T.Jamelle Goldston, NP-C 11/12/2012 Edema New  onset lotion b.i.d. likely due to combination of venous insufficiency, prolonged sitting and standing during her spouse this hospitalization, mildly elevated blood pressure.  Recommend leg elevation daily, will adjust blood pressure  medication.  Hypertension Valsartan was started July 2014 and 40 mg daily. Patient with mildly elevated blood pressure, some dizziness when lying down. Will increase valsartan to 40 mg BID. Patient will continue to check her blood pressure intermittently at home. Recommend repeat BMP next week,  followup with Dr. Chilton Si in about 10 days

## 2012-11-15 ENCOUNTER — Encounter: Payer: Medicare Other | Admitting: Internal Medicine

## 2012-11-16 LAB — BASIC METABOLIC PANEL
BUN: 18 mg/dL (ref 4–21)
Creatinine: 0.9 mg/dL (ref 0.5–1.1)
Glucose: 95 mg/dL
Potassium: 4.4 mmol/L (ref 3.4–5.3)
Sodium: 134 mmol/L — AB (ref 137–147)

## 2012-11-22 ENCOUNTER — Non-Acute Institutional Stay: Payer: Medicare Other | Admitting: Geriatric Medicine

## 2012-11-22 ENCOUNTER — Encounter: Payer: Self-pay | Admitting: Geriatric Medicine

## 2012-11-22 VITALS — BP 112/64 | HR 88 | Wt 104.4 lb

## 2012-11-22 DIAGNOSIS — R609 Edema, unspecified: Secondary | ICD-10-CM

## 2012-11-22 DIAGNOSIS — I10 Essential (primary) hypertension: Secondary | ICD-10-CM

## 2012-11-22 MED ORDER — VALSARTAN 40 MG PO TABS: 40.0000 mg | ORAL_TABLET | Freq: Two times a day (BID) | ORAL | Status: DC

## 2012-11-22 NOTE — Assessment & Plan Note (Addendum)
Blood pressure readings satisfactory with current medication, lab satisfactory as well. Patient will continue to monitor blood pressure at home, if systolic blood pressure dips below 115 she will reduce dosing of valsartan to once a day. Have asked her to report to the clinic nurse if she changes the dose of her medication

## 2012-11-22 NOTE — Progress Notes (Signed)
Patient ID: Cynthia Watkins, female   DOB: 1924/05/19, 79 y.o.   MRN: [id number] [facility] [phone])  Code Status: Full Code  Contact Information   Name Relation Home Work [place] Son [phone]  [phone]   Cynthia, Watkins [phone]       Chief Complaint  Patient presents with  . Leg Swelling    Patient c/o leg swelling since 11/11/2012, swelling is now improved    HPI: This is a 79 y.o. female resident of [facility] section returns to clinic today in follow up of LE edema.  Last visit:  Edema New onset LE edemaq. likely due to combination of venous insufficiency, prolonged sitting and standing during her spouse's hospitalization, mildly elevated blood pressure.  Recommend leg elevation daily, will adjust blood pressure medication.  Hypertension Valsartan was started July 2014 and 40 mg daily. Patient with mildly elevated blood pressure, some dizziness when lying down. Will increase valsartan to 40 mg BID. Patient will continue to check her blood pressure intermittently at home. Recommend repeat BMP next week,  followup with Dr. Chilton Si in about 10 days  Patient has been checking her blood pressure at home systolic range has been 120s to 140s. Patient reports the swelling in her legs is much better. She returned to clinic for lab draw last week, BMP all within normal limits.    No Known Allergies Medications Reviewed  DATA REVIEWED  Radiologic Exams:   Cardiovascular Exams:   Laboratory Studies  08/03/2012: Glucose 93, BUN 15, creatinine 0.80, sodium 137, potassium 4.4. Protein/LFTs WNL    Total cholesterol 250, triglyceride 122, HDL 72, LDL 154   A1c 5.6 Lab Results  Component Value Date   WBC 7.4 08/17/2012   HGB 12.6 08/17/2012   HCT 36.8 08/17/2012   PLT 269 08/17/2012   GLUCOSE 124 08/17/2012   ALT 13 08/17/2012   AST 21 08/17/2012   CO2 27 08/17/2012    Lab Results  Component Value Date   NA 134* 11/16/2012   K 4.4 11/16/2012   CREATININE 0.9 11/16/2012   BUN 18 11/16/2012    Review of Systems  DATA OBTAINED: from patient, family member (son) GENERAL: Feels OK, a bit tired, spouse's memorial service was this weekend, many visitors.   No fevers, change in appetite or weight MOUTH/THROAT: No mouth or tooth pain  No sore throat No difficulty chewing or swallowing RESPIRATORY: No cough, wheezing, SOB CARDIAC: No chest pain, palpitations  Less edema GI: No abdominal pain  No N/V/D or constipation  No heartburn or reflux  MUSCULOSKELETAL: No joint pain, swelling or stiffness  No back pain  No muscle ache, pain, weakness  Gait is steady  No recent falls.  NEUROLOGIC:  Occ. Dizziness when lying down, No fainting, headache,   No change in mental status.  PSYCHIATRIC: No feelings of anxiety, depression, acknowledges stress/sadness re: spouse's illness and death.  Sleeping OK.      Physical Exam Filed Vitals:   11/22/12 1424  BP: 112/64  Pulse: 88  Weight: 104 lb 6.4 oz (47.356 kg)  SpO2: 95%   Body mass index is 19.74 kg/(m^2).  GENERAL APPEARANCE: No acute distress, appropriately groomed, Thin body habitus. Alert, pleasant, conversant. SKIN: No diaphoresis, rash, unusual lesions, wounds HEAD: Normocephalic, atraumatic EYES: Conjunctiva/lids clear. Marland Kitchen  EARS:  Hearing grossly normal. NOSE: No deformity or discharge. MOUTH/THROAT: Lips w/o lesions. Oral mucosa, tongue moist, w/o lesion. Oropharynx w/o redness or lesions.  NECK: Supple, full ROM. No thyroid tenderness, enlargement or nodule LYMPHATICS: No head, neck or supraclavicular adenopathy RESPIRATORY: Breathing is even, unlabored. Lung sounds are clear and full.  CARDIOVASCULAR: Heart RRR. No murmur or extra heart sounds  VENOUS: Bilateral LE varicosities. Mild venous stasis skin changes  EDEMA: Trace bil. LE edema   MUSCULOSKELETAL: Moves all extremities with full ROM,  strength and tone. Gait is steady NEUROLOGIC: Oriented to time, place, person. Speech clear, no tremor.  PSYCHIATRIC: Mood and affect appropriate to situation  ASSESSMENT/PLAN  Hypertension Blood pressure readings satisfactory with current medication, lab satisfactory as well. Patient will continue to monitor blood pressure at home, if systolic blood pressure dips below 115 she will reduce dosing of valsartan to once a day. Have asked her to report to the clinic nurse if she changes the dose of her medication  Edema Lower extremity edema is improved, no further intervention required   Follow up: As scheduled with Dr. Chilton Si in [facility]  Rudolf Blizard T.Anjel Perfetti, NP-C 11/22/2012

## 2012-11-22 NOTE — Assessment & Plan Note (Signed)
Lower extremity edema is improved, no further intervention required

## 2012-11-23 ENCOUNTER — Encounter (INDEPENDENT_AMBULATORY_CARE_PROVIDER_SITE_OTHER): Payer: Self-pay

## 2012-11-23 ENCOUNTER — Telehealth: Payer: Self-pay | Admitting: Oncology

## 2012-11-23 ENCOUNTER — Ambulatory Visit (HOSPITAL_BASED_OUTPATIENT_CLINIC_OR_DEPARTMENT_OTHER): Payer: Medicare Other | Admitting: Oncology

## 2012-11-23 VITALS — BP 120/77 | HR 75 | Temp 97.1°F | Resp 18 | Ht 61.0 in | Wt 103.8 lb

## 2012-11-23 DIAGNOSIS — C50519 Malignant neoplasm of lower-outer quadrant of unspecified female breast: Secondary | ICD-10-CM

## 2012-11-23 DIAGNOSIS — C50511 Malignant neoplasm of lower-outer quadrant of right female breast: Secondary | ICD-10-CM

## 2012-11-23 DIAGNOSIS — C50911 Malignant neoplasm of unspecified site of right female breast: Secondary | ICD-10-CM

## 2012-11-23 DIAGNOSIS — M810 Age-related osteoporosis without current pathological fracture: Secondary | ICD-10-CM

## 2012-11-23 NOTE — Telephone Encounter (Signed)
, °

## 2012-11-23 NOTE — Progress Notes (Signed)
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 Preoperative MRI showed solitary mass. She had lumpectomy 12-27-2007 with 1.5 cm grade 3 invasive ductal carcinoma, no axillary nodes evaluated. She had RT to breast and axilla by Dr [name]. Patient was not inclined to pursue adjuvant chemotherapy or herceptin at diagnosis. She was on Femara from 03/2008 thru 11/2009 when this was changed to tamoxifen due to osteoporosis. She has tolerated the tamoxifen without difficulty. She has not had hysterectomy. Last mammograms were at [facility] 11-21-2011 with scattered fibroglandular densities and no mammographic findings of concern. She had unremarkable CXR with ED evaluation for infectious cough in Jan 2014."  The patient's subsequent history is as detailed below  INTERVAL HISTORY: Cynthia Watkins is seen today in the breast clinic for followup of her history of breast cancer. She is establishing herself in my service today. The interval history since her last visit here is significant for her husband's death. She tells me this was followed not completely unexpected". She appears to be grieving appropriately, and is making sure to continue to meet with friends at [facility], where she lives, and to exercise regularly.  REVIEW OF SYSTEMS: Cynthia Watkins tells me she has no difficulty sleeping. Sometimes her ankles swell. She has had some changes in her vision which are being evaluated by ophthalmology. She had bursitis in her shoulders and a little bit of chronic low back pain. She denies cough, phlegm production,  pleurisy, or shortness of breath. She denies unusual headaches, dizziness, gait imbalance, nausea, vomiting, or falls. A detailed review of systems was otherwise noncontributory  PAST MEDICAL HISTORY: Past Medical History  Diagnosis Date  . Hypertension   . Hypercholesterolemia     on lipitor  . Right bundle branch block   . Breast cancer 12/11/2008  . Mitral valve prolapse   . Fatigue 01/2012  . Palpitations 08/05/2000  . Senile osteoporosis 09/28/2001  . Other abnormal blood chemistry 01/21/2010  . Acute upper respiratory infections of unspecified site 06/09/2011    PAST SURGICAL HISTORY: Past Surgical History  Procedure Laterality Date  . Tonsillectomy    . Tubal ligation  1963  . Laparoscopic lysis of adhesions  07/15/2006    Dr. Michaell Cowing  . Other surgical history    . Breast lumpectomy Right 12/27/2007    FAMILY HISTORY Family History  Problem Relation Age of Onset  . Stroke Mother   . Cancer Father     colon  . Alzheimer's disease Sister    the patient's father had a history of colon cancer. 2 of his sisters had breast cancer, but not diagnosed before age 53. The patient's mother lived to be 34. The patient's sister was diagnosed with breast cancer in her 47s.  GYNECOLOGIC HISTORY:  GX P4  SOCIAL HISTORY:  The patient has a degree in history which she thought. She also wrote books on American history. Her husband, Dr. Ralene Muskrat is a pediatric surgeon and died earlier this year. Her son Jonny Ruiz of course is a urologist locally. The patient also has children in [place] and [place].  Her fourth child unfortunately died in a drowning accident. She has 7 grandchildren and 2 stepgrandchildren. She attends the [employer]    ADVANCED DIRECTIVES:    HEALTH MAINTENANCE: History  Substance Use Topics  . Smoking status: Former Smoker    Quit date: 08/06/1954  . Smokeless tobacco: Never Used  . Alcohol Use: Yes     Comment: Occasionally       Colonoscopy:  PAP:  Bone density: November 2013/[facility]/T - 3.3  Lipid panel:  No Known Allergies  Current Outpatient Prescriptions  Medication Sig Dispense Refill  . cholecalciferol (VITAMIN D) 1000 UNITS tablet Take 1,000 Units by mouth daily. Take one tablet daily      . tamoxifen (NOLVADEX) 20 MG tablet TAKE 1 TABLET (20 MG TOTAL) BY MOUTH DAILY.  30 tablet  2  . valsartan (DIOVAN) 40 MG tablet Take 1 tablet (40 mg total) by mouth 2 (two) times daily.  60 tablet  5   No current facility-administered medications for this visit.    OBJECTIVE: Elderly white woman who appears well Filed Vitals:   11/23/12 1430  BP: 120/77  Pulse: 75  Temp: 97.1 F (36.2 C)  Resp: 18     Body mass index is 19.62 kg/(m^2).    ECOG FS:1 - Symptomatic but completely ambulatory  Ocular: Sclerae unicteric, pupils round and reactive to light Ear-nose-throat: Oropharynx clear and moist Lymphatic: No cervical or supraclavicular adenopathy Lungs no rales or rhonchi, good excursion bilaterally Heart regular rate and rhythm Abd soft, nontender, positive bowel sounds MSK no focal spinal tenderness, no upper remedy lymphedema Neuro: non-focal, well-oriented, appropriate affect Breasts: The right breast is status post lumpectomy and radiation. There is no evidence of local recurrence. The right axilla is benign. The left breast is unremarkable.   LAB RESULTS:  CMP     Component Value Date/Time   NA 134* 11/16/2012   NA 133* 08/17/2012 1326   NA 130* 01/29/2012 0917   K 4.4 11/16/2012   K 4.2 08/17/2012 1326   CL 98 02/17/2012 1500   CL 94* 01/29/2012 0917   CO2 27 08/17/2012 1326   CO2 29 01/29/2012 0917   GLUCOSE 124 08/17/2012 1326   GLUCOSE 134* 02/17/2012 1500   GLUCOSE 92 01/29/2012 0917   BUN 18 11/16/2012   BUN 14.7 08/17/2012 1326   BUN 16 01/29/2012 0917   CREATININE 0.9 11/16/2012   CREATININE 0.8 08/17/2012 1326   CREATININE 0.8 01/29/2012 0917   CALCIUM 9.1 08/17/2012 1326   CALCIUM  9.1 01/29/2012 0917   PROT 6.5 08/17/2012 1326   PROT 5.8* 07/22/2011 1304   ALBUMIN 3.6 08/17/2012 1326   ALBUMIN 4.0 07/22/2011 1304   AST 21 08/17/2012 1326   AST 20 07/22/2011 1304   ALT 13 08/17/2012 1326   ALT 11 07/22/2011 1304   ALKPHOS 48 08/17/2012 1326   ALKPHOS 39 07/22/2011 1304   BILITOT 0.39 08/17/2012 1326   BILITOT 0.4 07/22/2011 1304   GFRNONAA >60 12/23/2007 0935   GFRAA  Value: >60        The eGFR has been calculated using the MDRD equation. This calculation has not been validated in all clinical 12/23/2007 0935    I No results found for this basename: SPEP, UPEP,  kappa and lambda light chains    Lab Results  Component Value Date   WBC 7.4 08/17/2012   NEUTROABS 4.7 08/17/2012   HGB 12.6 08/17/2012   HCT 36.8 08/17/2012   MCV 98.6 08/17/2012  PLT 269 08/17/2012      Chemistry      Component Value Date/Time   NA 134* 11/16/2012   NA 133* 08/17/2012 1326   NA 130* 01/29/2012 0917   K 4.4 11/16/2012   K 4.2 08/17/2012 1326   CL 98 02/17/2012 1500   CL 94* 01/29/2012 0917   CO2 27 08/17/2012 1326   CO2 29 01/29/2012 0917   BUN 18 11/16/2012   BUN 14.7 08/17/2012 1326   BUN 16 01/29/2012 0917   CREATININE 0.9 11/16/2012   CREATININE 0.8 08/17/2012 1326   CREATININE 0.8 01/29/2012 0917   GLU 95 11/16/2012      Component Value Date/Time   CALCIUM 9.1 08/17/2012 1326   CALCIUM 9.1 01/29/2012 0917   ALKPHOS 48 08/17/2012 1326   ALKPHOS 39 07/22/2011 1304   AST 21 08/17/2012 1326   AST 20 07/22/2011 1304   ALT 13 08/17/2012 1326   ALT 11 07/22/2011 1304   BILITOT 0.39 08/17/2012 1326   BILITOT 0.4 07/22/2011 1304       Lab Results  Component Value Date   LABCA2 33 02/17/2012    No components found with this basename: [id number]    No results found for this basename: INR,  in the last 168 hours  Urinalysis    Component Value Date/Time   COLORURINE YELLOW 12/23/2007 0945   APPEARANCEUR CLEAR 12/23/2007 0945   LABSPEC 1.018 12/23/2007 0945   PHURINE 7.0 12/23/2007 0945    GLUCOSEU NEGATIVE 12/23/2007 0945   HGBUR NEGATIVE 12/23/2007 0945   BILIRUBINUR NEGATIVE 12/23/2007 0945   KETONESUR NEGATIVE 12/23/2007 0945   PROTEINUR NEGATIVE 12/23/2007 0945   UROBILINOGEN 0.2 12/23/2007 0945   NITRITE NEGATIVE 12/23/2007 0945   LEUKOCYTESUR TRACE* 12/23/2007 0945    STUDIES: Mammography at [facility] 11/22/2012 shows scattered areas of fibroglandular density (category B). There was no evidence of malignancy.   ASSESSMENT: 79 y.o. [facility] resident status post right simple lumpectomy 12/27/2007 for a pT1c pNX, stage IA invasive ductal carcinoma, grade 3, estrogen receptor 98% positive, progesterone receptor 32% positive, with an MIB-1 of 14% and HercepTest positive at 3+  (1) declined adjuvant chemotherapy/trastuzumab  (2) completed adjuvant radiation to the right breast and axilla February 2010  (3) antiestrogen therapy: Anastrozole March 2010 through November 2011, at which point she switched to tamoxifen  (4) osteoporosis; most recent DEXA scan at [facility] 11/21/2011  PLAN: Cynthia Watkins is doing well from a breast cancer point of view, and soon will complete 5 years of antiandrogen therapy. Given the fact that her tumor was HER-2 positive and lymph nodes were not involved, we will discuss whether she wants to continue tamoxifen an additional 5 years. This of course would also help some with the osteoporosis.  Cynthia Watkins is grieving appropriately her husband's recent loss. She is keeping active and meeting with friends. I am going to see her again in March of 2015. She knows to call for any problems that may develop before her next visit here.  Lowella Dell, MD   11/24/2012 7:21 PM

## 2012-11-24 DIAGNOSIS — C50511 Malignant neoplasm of lower-outer quadrant of right female breast: Secondary | ICD-10-CM | POA: Insufficient documentation

## 2012-11-25 ENCOUNTER — Ambulatory Visit: Payer: Medicare Other | Admitting: Cardiology

## 2012-12-09 ENCOUNTER — Encounter: Payer: Self-pay | Admitting: Cardiology

## 2012-12-14 ENCOUNTER — Other Ambulatory Visit: Payer: Self-pay | Admitting: *Deleted

## 2012-12-14 DIAGNOSIS — C50911 Malignant neoplasm of unspecified site of right female breast: Secondary | ICD-10-CM

## 2012-12-14 MED ORDER — TAMOXIFEN CITRATE 20 MG PO TABS: 20.0000 mg | ORAL_TABLET | Freq: Every day | ORAL | Status: DC

## 2013-02-08 LAB — BASIC METABOLIC PANEL
BUN: 16 mg/dL (ref 4–21)
Creatinine: 0.8 mg/dL (ref 0.5–1.1)
GLUCOSE: 94 mg/dL
POTASSIUM: 4.2 mmol/L (ref 3.4–5.3)
SODIUM: 137 mmol/L (ref 137–147)

## 2013-02-08 LAB — LIPID PANEL
Cholesterol: 214 mg/dL — AB (ref 0–200)
HDL: 65 mg/dL (ref 35–70)
LDL Cholesterol: 143 mg/dL
Triglycerides: 125 mg/dL (ref 40–160)

## 2013-02-14 ENCOUNTER — Non-Acute Institutional Stay: Payer: Medicare Other | Admitting: Internal Medicine

## 2013-02-14 ENCOUNTER — Encounter: Payer: Self-pay | Admitting: Internal Medicine

## 2013-02-14 VITALS — BP 124/76 | HR 60 | Ht 61.0 in | Wt 107.0 lb

## 2013-02-14 DIAGNOSIS — C50519 Malignant neoplasm of lower-outer quadrant of unspecified female breast: Secondary | ICD-10-CM

## 2013-02-14 DIAGNOSIS — I10 Essential (primary) hypertension: Secondary | ICD-10-CM

## 2013-02-14 DIAGNOSIS — C50511 Malignant neoplasm of lower-outer quadrant of right female breast: Secondary | ICD-10-CM

## 2013-02-14 DIAGNOSIS — R7309 Other abnormal glucose: Secondary | ICD-10-CM

## 2013-02-14 DIAGNOSIS — E7800 Pure hypercholesterolemia, unspecified: Secondary | ICD-10-CM

## 2013-02-14 DIAGNOSIS — R609 Edema, unspecified: Secondary | ICD-10-CM

## 2013-02-14 DIAGNOSIS — R739 Hyperglycemia, unspecified: Secondary | ICD-10-CM

## 2013-02-14 NOTE — Progress Notes (Signed)
Patient ID: Cynthia Watkins, female   DOB: October 17, 1924, 80 y.o.   MRN: [id number]    Location:  [facility] (12)    No Known Allergies  Chief Complaint  Patient presents with  . Medical Managment of Chronic Issues    blood pressure    HPI:  Hypertension: controlled  Hypercholesterolemia: adequate control  Hyperglycemia: normal on last lab  Edema: trace bipedal  Breast cancer of lower-outer quadrant of right female breast: no relapse. Dr. Jana Hakim is keeping her on tamoxifen.    Medications: Patient's Medications  New Prescriptions   No medications on file  Previous Medications   CHOLECALCIFEROL (VITAMIN D) 1000 UNITS TABLET    Take 1,000 Units by mouth daily. Take one tablet daily   TAMOXIFEN (NOLVADEX) 20 MG TABLET    Take 1 tablet (20 mg total) by mouth daily.  Modified Medications   Modified Medication Previous Medication   VALSARTAN (DIOVAN) 40 MG TABLET valsartan (DIOVAN) 40 MG tablet      Take 40 mg by mouth. Take one tablet daily. Take second tablet if SBP > 150, DBP > 100.    Take 1 tablet (40 mg total) by mouth 2 (two) times daily.  Discontinued Medications   No medications on file     Review of Systems  Constitutional: Negative.   HENT: Negative.   Eyes: Positive for visual disturbance (corrective lenses).  Respiratory: Negative.   Cardiovascular: Negative.  Negative for chest pain, palpitations and leg swelling.  Gastrointestinal: Negative for abdominal pain and abdominal distention.  Endocrine: Negative.   Genitourinary: Negative.   Musculoskeletal: Negative.   Skin: Negative.   Allergic/Immunologic: Negative.   Neurological: Negative.   Hematological: Negative.   Psychiatric/Behavioral: Negative.     Filed Vitals:   02/14/13 1546  BP: 124/76  Pulse: 60  Height: 5\' 1"  (1.549 m)  Weight: 107 lb (48.535 kg)   Physical Exam  Constitutional: She is oriented to person, place, and time.  Thin.  Elderly.  HENT:  Head: Normocephalic and atraumatic.  Right Ear: External ear normal.  Left Ear: External ear normal.  Nose: Nose normal.  Eyes: Conjunctivae and EOM are normal. Pupils are equal, round, and reactive to light.  Neck: No JVD present. No tracheal deviation present. No thyromegaly present.  Cardiovascular: Normal rate, regular rhythm, normal heart sounds and intact distal pulses.  Exam reveals no gallop and no friction rub.   No murmur heard. Pulmonary/Chest: No respiratory distress. She has no wheezes. She has no rales. She exhibits no tenderness.  Abdominal: She exhibits no distension and no mass. There is no tenderness.  Musculoskeletal: Normal range of motion. She exhibits edema. She exhibits no tenderness.  Lymphadenopathy:    She has no cervical adenopathy.  Neurological: She is alert and oriented to person, place, and time. She has normal reflexes. No cranial nerve deficit.  Skin: No rash noted. No erythema. No pallor.  Psychiatric: She has a normal mood and affect. Her behavior is normal. Judgment and thought content normal.     Labs reviewed: Nursing Home on 02/14/2013  Component Date Value Range Status  . Glucose 02/08/2013 94   Final  . BUN 02/08/2013 16  4 - 21 mg/dL Final  . Creatinine 02/08/2013 0.8  0.5 - 1.1 mg/dL Final  . Potassium 02/08/2013 4.2  3.4 - 5.3 mmol/L Final  . Sodium 02/08/2013 137  137 - 147 mmol/L Final  . Triglycerides 02/08/2013 125  40 -  160 mg/dL Final  . Cholesterol 02/08/2013 214* 0 - 200 mg/dL Final  . HDL 02/08/2013 65  35 - 70 mg/dL Final  . LDL Cholesterol 02/08/2013 143   Final  Nursing Home on 11/22/2012  Component Date Value Range Status  . Glucose 11/16/2012 95   Final  . BUN 11/16/2012 18  4 - 21 mg/dL Final  . Creatinine 11/16/2012 0.9  0.5 - 1.1 mg/dL Final  . Potassium 11/16/2012 4.4  3.4 - 5.3 mmol/L Final  . Sodium 11/16/2012 134* 137 - 147 mmol/L Final      Assessment/Plan  Hypertension:  controlled  Hypercholesterolemia: at her age, i think it best o leave her off medications to lower LDL. She is adequately protected by the HDL at 68  Hyperglycemia: recently normal  Edema: unchanged  Breast cancer of lower-outer quadrant of right female breast: in remission

## 2013-02-14 NOTE — Patient Instructions (Signed)
Continuecurrent medicatioins

## 2013-02-18 ENCOUNTER — Encounter: Payer: Self-pay | Admitting: Oncology

## 2013-02-22 ENCOUNTER — Ambulatory Visit: Payer: Medicare Other | Admitting: Cardiology

## 2013-02-24 ENCOUNTER — Ambulatory Visit: Payer: Medicare Other | Admitting: Cardiology

## 2013-03-21 ENCOUNTER — Other Ambulatory Visit: Payer: Self-pay | Admitting: *Deleted

## 2013-03-21 DIAGNOSIS — C50519 Malignant neoplasm of lower-outer quadrant of unspecified female breast: Secondary | ICD-10-CM

## 2013-03-22 ENCOUNTER — Other Ambulatory Visit (HOSPITAL_BASED_OUTPATIENT_CLINIC_OR_DEPARTMENT_OTHER): Payer: Medicare Other

## 2013-03-22 ENCOUNTER — Ambulatory Visit (HOSPITAL_BASED_OUTPATIENT_CLINIC_OR_DEPARTMENT_OTHER): Payer: Medicare Other | Admitting: Hematology and Oncology

## 2013-03-22 ENCOUNTER — Other Ambulatory Visit: Payer: Self-pay

## 2013-03-22 VITALS — BP 126/73 | HR 62 | Temp 98.2°F | Resp 18 | Ht 61.0 in | Wt 105.8 lb

## 2013-03-22 DIAGNOSIS — M810 Age-related osteoporosis without current pathological fracture: Secondary | ICD-10-CM

## 2013-03-22 DIAGNOSIS — C50119 Malignant neoplasm of central portion of unspecified female breast: Secondary | ICD-10-CM

## 2013-03-22 DIAGNOSIS — C50519 Malignant neoplasm of lower-outer quadrant of unspecified female breast: Secondary | ICD-10-CM

## 2013-03-22 DIAGNOSIS — C50511 Malignant neoplasm of lower-outer quadrant of right female breast: Secondary | ICD-10-CM

## 2013-03-22 DIAGNOSIS — Z170 Estrogen receptor positive status [ER+]: Secondary | ICD-10-CM

## 2013-03-22 LAB — CBC WITH DIFFERENTIAL/PLATELET
BASO%: 0.5 % (ref 0.0–2.0)
Basophils Absolute: 0 10*3/uL (ref 0.0–0.1)
EOS%: 0.4 % (ref 0.0–7.0)
Eosinophils Absolute: 0 10*3/uL (ref 0.0–0.5)
HCT: 37.4 % (ref 34.8–46.6)
HGB: 12.6 g/dL (ref 11.6–15.9)
LYMPH#: 2.4 10*3/uL (ref 0.9–3.3)
LYMPH%: 33.4 % (ref 14.0–49.7)
MCH: 33.5 pg (ref 25.1–34.0)
MCHC: 33.7 g/dL (ref 31.5–36.0)
MCV: 99.4 fL (ref 79.5–101.0)
MONO#: 0.6 10*3/uL (ref 0.1–0.9)
MONO%: 9 % (ref 0.0–14.0)
NEUT%: 56.7 % (ref 38.4–76.8)
NEUTROS ABS: 4 10*3/uL (ref 1.5–6.5)
Platelets: 262 10*3/uL (ref 145–400)
RBC: 3.76 10*6/uL (ref 3.70–5.45)
RDW: 13.7 % (ref 11.2–14.5)
WBC: 7 10*3/uL (ref 3.9–10.3)

## 2013-03-22 LAB — COMPREHENSIVE METABOLIC PANEL (CC13)
ALT: 16 U/L (ref 0–55)
AST: 22 U/L (ref 5–34)
Albumin: 3.8 g/dL (ref 3.5–5.0)
Alkaline Phosphatase: 52 U/L (ref 40–150)
Anion Gap: 8 mEq/L (ref 3–11)
BILIRUBIN TOTAL: 0.42 mg/dL (ref 0.20–1.20)
BUN: 16.1 mg/dL (ref 7.0–26.0)
CALCIUM: 9.2 mg/dL (ref 8.4–10.4)
CHLORIDE: 95 meq/L — AB (ref 98–109)
CO2: 28 meq/L (ref 22–29)
Creatinine: 0.8 mg/dL (ref 0.6–1.1)
Glucose: 92 mg/dl (ref 70–140)
POTASSIUM: 4.6 meq/L (ref 3.5–5.1)
Sodium: 131 mEq/L — ABNORMAL LOW (ref 136–145)
Total Protein: 6.4 g/dL (ref 6.4–8.3)

## 2013-03-23 ENCOUNTER — Encounter: Payer: Self-pay | Admitting: Hematology and Oncology

## 2013-03-23 ENCOUNTER — Telehealth: Payer: Self-pay | Admitting: *Deleted

## 2013-03-23 NOTE — Progress Notes (Addendum)
. 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 Preoperative MRI showed solitary mass. She had lumpectomy 12-27-2007 with 1.5 cm grade 3 invasive ductal carcinoma, no axillary nodes evaluated. She had RT to breast and axilla by Dr [name]. Patient was not inclined to pursue adjuvant chemotherapy or herceptin at diagnosis. She was on Femara from 03/2008 thru 11/2009 when this was changed to tamoxifen due to osteoporosis. She has tolerated the tamoxifen without difficulty. She has not had hysterectomy. Last mammograms were at [facility] 11-21-2011 with scattered fibroglandular densities and no mammographic findings of concern. She had unremarkable CXR with ED evaluation for infectious cough in Jan 2014."  The patient's subsequent history is as detailed below  INTERVAL HISTORY: [name] is seen today in the breast clinic for followup of her history of breast cancer.she was last seen by dr.[name] 111/2014.She continues to do well.She continues on Tamoxifen without significant side effects.She denies edema,dyspnea,abdominal pain or cough.she reports arthritis type symptoms chronic.She maintains good appetite and energy for her age.She reports not sleeping well.   REVIEW OF SYSTEMS: Sometimes her ankles swell. She has had some changes in her vision which are being evaluated by ophthalmology. She had bursitis in her shoulders and a little bit of chronic low back pain. of breath. She denies unusual headaches, dizziness, gait imbalance, nausea, vomiting, or falls.  A detailed review of systems  was otherwise noncontributory  PAST MEDICAL HISTORY: Past Medical History  Diagnosis Date  . Hypertension   . Hypercholesterolemia     on lipitor  . Right bundle branch block   . Breast cancer 12/11/2008  . Mitral valve prolapse   . Fatigue 01/2012  . Palpitations 08/05/2000  . Senile osteoporosis 09/28/2001  . Other abnormal blood chemistry 01/21/2010  . Acute upper respiratory infections of unspecified site 06/09/2011    PAST SURGICAL HISTORY: Past Surgical History  Procedure Laterality Date  . Tonsillectomy    . Tubal ligation  1963  . Laparoscopic lysis of adhesions  07/15/2006    [name]  . Other surgical history    . Breast lumpectomy Right 12/27/2007    FAMILY HISTORY Family History  Problem Relation Age of Onset  . Stroke Mother   . Cancer Father     colon  . Alzheimer's disease Sister    the patient's father had a history of colon cancer. 2 of his sisters had breast cancer, but not diagnosed before age 33. The patient's mother lived to be 90. The patient's sister was diagnosed with breast cancer in her 54s.  GYNECOLOGIC HISTORY:  GX P4  SOCIAL HISTORY:  The patient has a degree in history which she thought. She also wrote books on American history. Her husband, Dr. [name]  is a pediatric surgeon and died earlier this year. Her son Cynthia Watkins of course is a urologist locally. The patient also has children in [place] and [place]. Her fourth child unfortunately died in a drowning accident. She has 7 grandchildren and 2 stepgrandchildren. She attends the [employer]:    HEALTH  MAINTENANCE: History  Substance Use Topics  . Smoking status: Former Smoker    Quit date: 08/06/1954  . Smokeless tobacco: Never Used  . Alcohol Use: Yes     Comment: Occasionally      Colonoscopy:  PAP:  Bone density: November 2013/[facility]/T - 3.3  Lipid panel:  No Known Allergies  Current Outpatient Prescriptions  Medication Sig Dispense Refill   . cholecalciferol (VITAMIN D) 1000 UNITS tablet Take 1,000 Units by mouth daily. Take one tablet daily      . tamoxifen (NOLVADEX) 20 MG tablet Take 1 tablet (20 mg total) by mouth daily.  30 tablet  3  . valsartan (DIOVAN) 40 MG tablet Take 40 mg by mouth. Take one tablet daily. Take second tablet if SBP > 150, DBP > 100.       No current facility-administered medications for this visit.    OBJECTIVE: Elderly white woman who appears well Filed Vitals:   03/22/13 1449  BP: 126/73  Pulse: 62  Temp: 98.2 F (36.8 C)  Resp: 18     Body mass index is 20 kg/(m^2).    ECOG FS:1 - Symptomatic but completely ambulatory  Ocular: Sclerae unicteric, pupils round and reactive to light Ear-nose-throat: Oropharynx clear and moist Lymphatic: No cervical or supraclavicular adenopathy Lungs no rales or rhonchi, good excursion bilaterally Heart regular rate and rhythm Abd soft, nontender, positive bowel sounds MSK no focal spinal tenderness, no upper remedy lymphedema Neuro: non-focal, well-oriented, appropriate affect Breasts: The right breast is status post lumpectomy and radiation. There is no evidence of  breast mass. The right axilla is benign. The left breast is unremarkable.   LAB RESULTS:  CMP     Component Value Date/Time   NA 131* 03/22/2013 1435   NA 137 02/08/2013   NA 130* 01/29/2012 0917   K 4.6 03/22/2013 1435   K 4.2 02/08/2013   CL 98 02/17/2012 1500   CL 94* 01/29/2012 0917   CO2 28 03/22/2013 1435   CO2 29 01/29/2012 0917   GLUCOSE 92 03/22/2013 1435   GLUCOSE 134* 02/17/2012 1500   GLUCOSE 92 01/29/2012 0917   BUN 16.1 03/22/2013 1435   BUN 16 02/08/2013   BUN 16 01/29/2012 0917   CREATININE 0.8 03/22/2013 1435   CREATININE 0.8 02/08/2013   CREATININE 0.8 01/29/2012 0917   CALCIUM 9.2 03/22/2013 1435   CALCIUM 9.1 01/29/2012 0917   PROT 6.4 03/22/2013 1435   PROT 5.8* 07/22/2011 1304   ALBUMIN 3.8 03/22/2013 1435   ALBUMIN 4.0 07/22/2011 1304   AST 22 03/22/2013 1435   AST 20  07/22/2011 1304   ALT 16 03/22/2013 1435   ALT 11 07/22/2011 1304   ALKPHOS 52 03/22/2013 1435   ALKPHOS 39 07/22/2011 1304   BILITOT 0.42 03/22/2013 1435   BILITOT 0.4 07/22/2011 1304   GFRNONAA >60 12/23/2007 0935   GFRAA  Value: >60        The eGFR has been calculated using the MDRD equation. This calculation has not been validated in all clinical 12/23/2007 0935    I No results found for this basename: SPEP,  UPEP,   kappa and lambda light chains    Lab Results  Component Value Date   WBC 7.0 03/22/2013   NEUTROABS 4.0 03/22/2013   HGB 12.6 03/22/2013   HCT 37.4 03/22/2013   MCV 99.4 03/22/2013   PLT 262 03/22/2013      Chemistry       Component Value Date/Time   CALCIUM  9.2 03/22/2013 1435   CALCIUM 9.1 01/29/2012 0917   ALKPHOS 52 03/22/2013 1435   ALKPHOS 39 07/22/2011 1304   AST 22 03/22/2013 1435   AST 20 07/22/2011 1304   ALT 16 03/22/2013 1435   ALT 11 07/22/2011 1304   BILITOT 0.42 03/22/2013 1435   BILITOT 0.4 07/22/2011 1304       STUDIES: Mammography at [facility] 11/22/2012 shows scattered areas of fibroglandular density (category B). There was no evidence of malignancy.  Bone density  Osteoporosis  T score -3.3 patient not taking Fosamax  ASSESSMENT: 80 y.o. [facility] resident status post right simple lumpectomy 12/27/2007 for a pT1c pNX, stage IA invasive ductal carcinoma, grade 3, estrogen receptor 98% positive, progesterone receptor 32% positive, with an MIB-1 of 14% and HercepTest positive at 3+  (1) declined adjuvant chemotherapy/trastuzumab  (2) completed adjuvant radiation to the right breast and axilla February 2010  (3) antiestrogen therapy: Anastrozole March 2010 through November 2011, at which point she switched to tamoxifen  (4) osteoporosis; most recent DEXA scan at [facility] 11/21/2011  PLAN: Patient is doing well  In terms of her breast cancer completes this month 5 years of antiandrogen therapy.  She wants to continue tamoxifen  for now. She will need  mammogram in 11/2012.  Osteoporosis patient would like to consider treatment and scheduled for Zometa twice per year Will need bone density in 11/2013. She needs to take Calcium 600 mg 2 tabl daily and Vit D3 2000 IU 1 tabl daily.  Will check CBC,CMP,Vit D level in 6 months with Zometa treatment and Dr.Magrinat follow up    Amada Kingfisher, MD   03/23/2013 8:48 AM Oncology/Hematology

## 2013-03-23 NOTE — Telephone Encounter (Signed)
Per staff message and POF I have scheduled appts.  JMW  

## 2013-03-23 NOTE — Telephone Encounter (Signed)
sw pt gv appt for 09/22/13 w/ labs@ 2:30p, ov@ 3pm, and tx to follow. i emailed MW to add the tx...td

## 2013-03-25 ENCOUNTER — Ambulatory Visit: Payer: Medicare Other

## 2013-04-19 ENCOUNTER — Ambulatory Visit (INDEPENDENT_AMBULATORY_CARE_PROVIDER_SITE_OTHER): Payer: Medicare Other | Admitting: Cardiology

## 2013-04-19 ENCOUNTER — Encounter: Payer: Self-pay | Admitting: Cardiology

## 2013-04-19 VITALS — BP 124/58 | HR 64 | Ht 61.0 in | Wt 105.8 lb

## 2013-04-19 DIAGNOSIS — I10 Essential (primary) hypertension: Secondary | ICD-10-CM

## 2013-04-19 DIAGNOSIS — R609 Edema, unspecified: Secondary | ICD-10-CM

## 2013-04-19 DIAGNOSIS — E7800 Pure hypercholesterolemia, unspecified: Secondary | ICD-10-CM

## 2013-04-19 DIAGNOSIS — I4510 Unspecified right bundle-branch block: Secondary | ICD-10-CM

## 2013-04-19 NOTE — Patient Instructions (Signed)
Continue your current therapy  I will see you in 6 months.   

## 2013-04-19 NOTE — Progress Notes (Signed)
Cynthia Watkins Date of Birth: August 30, 1924   History of Present Illness: Cynthia Watkins is seen today for  followup. She has a history of hypertension, hyperlipidemia, and a right bundle branch block. She reports she is feeling very well. Her energy level is good. She remains active. She denies any chest pain or shortness of breath. She has noticed some swelling in her legs since her husband passed away this year. She avoids salt.   Current Outpatient Prescriptions on File Prior to Visit  Medication Sig Dispense Refill  . cholecalciferol (VITAMIN D) 1000 UNITS tablet Take 1,000 Units by mouth daily. Calcium with vitamin D3 Take one tablet daily      . tamoxifen (NOLVADEX) 20 MG tablet Take 1 tablet (20 mg total) by mouth daily.  30 tablet  3  . valsartan (DIOVAN) 40 MG tablet Take 40 mg by mouth. Take one tablet daily. Take second tablet if SBP > 150, DBP > 100.       No current facility-administered medications on file prior to visit.    No Known Allergies  Past Medical History  Diagnosis Date  . Hypertension   . Hypercholesterolemia     on lipitor  . Right bundle branch block   . Breast cancer 12/11/2008  . Mitral valve prolapse   . Fatigue 01/2012  . Palpitations 08/05/2000  . Senile osteoporosis 09/28/2001  . Other abnormal blood chemistry 01/21/2010  . Acute upper respiratory infections of unspecified site 06/09/2011    Past Surgical History  Procedure Laterality Date  . Tonsillectomy    . Tubal ligation  1963  . Laparoscopic lysis of adhesions  07/15/2006    [name]  . Other surgical history    . Breast lumpectomy Right 12/27/2007    History  Smoking status  . Former Smoker  . Quit date: 08/06/1954  Smokeless tobacco  . Never Used    History  Alcohol Use  . Yes    Comment: Occasionally     Family History  Problem Relation Age of Onset  . Stroke Mother   . Cancer Father     colon  . Alzheimer's disease Sister     Review of Systems: As the history of  present illness.  All other systems were reviewed and are negative.  Physical Exam: BP 124/58  Pulse 64  Ht 5\' 1"  (1.549 m)  Wt 105 lb 12.8 oz (47.991 kg)  BMI 20.00 kg/m2 She is a pleasant, elderly white female in no acute distress.The patient is alert and oriented x 3.    The skin is warm and dry.   The HEENT exam is normal. The carotids are 2+ without bruits.  There is no thyromegaly.  There is no JVD.  The lungs are clear.    The heart exam reveals a regular rate with a normal S1 and S2.  There are no murmurs, gallops, or rubs.  The PMI is not displaced.   Abdominal exam reveals good bowel sounds.There are no masses.  Exam of the legs reveal trace edema.  There are superficial varicosities.  The distal pulses are intact.  Cranial nerves II - XII are intact.  Motor and sensory functions are intact.  The gait is normal.  LABORATORY DATA: Lab Results  Component Value Date   WBC 7.0 03/22/2013   HGB 12.6 03/22/2013   HCT 37.4 03/22/2013   PLT 262 03/22/2013   GLUCOSE 92 03/22/2013   CHOL 214* 02/08/2013   TRIG 125 02/08/2013  HDL 65 02/08/2013   LDLCALC 143 02/08/2013   ALT 16 03/22/2013   AST 22 03/22/2013   NA 131* 03/22/2013   K 4.6 03/22/2013   CL 98 02/17/2012   CREATININE 0.8 03/22/2013   BUN 16.1 03/22/2013   CO2 28 03/22/2013   TSH 1.26 01/29/2012   Ecg shows NSR with a RBBB  Assessment / Plan: 1. Hypertension.blood pressure is well controlled. She brings a list of readings at home which are satisfactory. Continue Diovan 40 mg daily.  2. Hypercholesterolemia, on chronic Lipitor.  3. Right bundle branch block, chronic.  4. Edema. Minimal by exam. I would not recommend diuretics with her history of hyponatremia. Continue sodium restriction, elevation, and support hose as needed.

## 2013-05-05 ENCOUNTER — Other Ambulatory Visit: Payer: Self-pay | Admitting: Oncology

## 2013-05-05 DIAGNOSIS — C50519 Malignant neoplasm of lower-outer quadrant of unspecified female breast: Secondary | ICD-10-CM

## 2013-08-29 ENCOUNTER — Encounter: Payer: Self-pay | Admitting: Internal Medicine

## 2013-09-13 ENCOUNTER — Telehealth: Payer: Self-pay | Admitting: *Deleted

## 2013-09-13 NOTE — Telephone Encounter (Signed)
Received call from patient that her dentist needs to cap two teeth. There is no dental surgery or teeth being pulled. Patient stated,"is it OK to get my teeth capped, and still receive Zometa on 09/22/13?" Return number is [phone]. Message given to MD.

## 2013-09-14 NOTE — Telephone Encounter (Signed)
Per Dr. Jana Hakim, I informed patient that it is OK to go to the dentist and get her two teeth capped. OK to receive Zometa on 9/17. Patient verbalized understanding.

## 2013-09-20 LAB — BASIC METABOLIC PANEL
BUN: 18 mg/dL (ref 4–21)
Creatinine: 0.8 mg/dL (ref 0.5–1.1)
Glucose: 81 mg/dL
Potassium: 4.2 mmol/L (ref 3.4–5.3)
Sodium: 137 mmol/L (ref 137–147)

## 2013-09-20 LAB — HEPATIC FUNCTION PANEL
ALK PHOS: 47 U/L (ref 25–125)
ALT: 14 U/L (ref 7–35)
AST: 22 U/L (ref 13–35)
Bilirubin, Total: 0.6 mg/dL

## 2013-09-20 LAB — LIPID PANEL
CHOLESTEROL: 199 mg/dL (ref 0–200)
HDL: 65 mg/dL (ref 35–70)
LDL CALC: 139 mg/dL
LDl/HDL Ratio: 2.1
Triglycerides: 115 mg/dL (ref 40–160)

## 2013-09-21 ENCOUNTER — Other Ambulatory Visit: Payer: Self-pay | Admitting: Emergency Medicine

## 2013-09-21 DIAGNOSIS — E559 Vitamin D deficiency, unspecified: Secondary | ICD-10-CM

## 2013-09-21 DIAGNOSIS — C50511 Malignant neoplasm of lower-outer quadrant of right female breast: Secondary | ICD-10-CM

## 2013-09-22 ENCOUNTER — Ambulatory Visit: Payer: Medicare Other | Admitting: Oncology

## 2013-09-22 ENCOUNTER — Other Ambulatory Visit (HOSPITAL_BASED_OUTPATIENT_CLINIC_OR_DEPARTMENT_OTHER): Payer: Medicare Other

## 2013-09-22 ENCOUNTER — Ambulatory Visit (HOSPITAL_BASED_OUTPATIENT_CLINIC_OR_DEPARTMENT_OTHER): Payer: Medicare Other

## 2013-09-22 ENCOUNTER — Telehealth: Payer: Self-pay | Admitting: Oncology

## 2013-09-22 ENCOUNTER — Other Ambulatory Visit: Payer: Medicare Other

## 2013-09-22 ENCOUNTER — Ambulatory Visit (HOSPITAL_BASED_OUTPATIENT_CLINIC_OR_DEPARTMENT_OTHER): Payer: Medicare Other | Admitting: Oncology

## 2013-09-22 VITALS — BP 141/71 | HR 71 | Temp 98.2°F | Resp 18 | Ht 61.0 in | Wt 103.7 lb

## 2013-09-22 DIAGNOSIS — C50519 Malignant neoplasm of lower-outer quadrant of unspecified female breast: Secondary | ICD-10-CM

## 2013-09-22 DIAGNOSIS — R739 Hyperglycemia, unspecified: Secondary | ICD-10-CM

## 2013-09-22 DIAGNOSIS — E559 Vitamin D deficiency, unspecified: Secondary | ICD-10-CM

## 2013-09-22 DIAGNOSIS — M810 Age-related osteoporosis without current pathological fracture: Secondary | ICD-10-CM

## 2013-09-22 DIAGNOSIS — C50511 Malignant neoplasm of lower-outer quadrant of right female breast: Secondary | ICD-10-CM

## 2013-09-22 LAB — COMPREHENSIVE METABOLIC PANEL
ALT: 15 U/L (ref 0–35)
AST: 20 U/L (ref 0–37)
Albumin: 3.6 g/dL (ref 3.5–5.2)
Alkaline Phosphatase: 51 U/L (ref 39–117)
BUN: 19 mg/dL (ref 6–23)
CALCIUM: 9.2 mg/dL (ref 8.4–10.5)
CHLORIDE: 101 meq/L (ref 96–112)
CO2: 27 meq/L (ref 19–32)
Creatinine, Ser: 0.7 mg/dL (ref 0.50–1.10)
GLUCOSE: 86 mg/dL (ref 70–99)
Potassium: 4.1 mEq/L (ref 3.5–5.3)
Sodium: 139 mEq/L (ref 135–145)
TOTAL PROTEIN: 6.6 g/dL (ref 6.0–8.3)
Total Bilirubin: 0.2 mg/dL — ABNORMAL LOW (ref 0.2–1.2)

## 2013-09-22 LAB — CBC WITH DIFFERENTIAL/PLATELET
BASO%: 0.7 % (ref 0.0–2.0)
BASOS ABS: 0.1 10*3/uL (ref 0.0–0.1)
EOS ABS: 0 10*3/uL (ref 0.0–0.5)
EOS%: 0.3 % (ref 0.0–7.0)
HCT: 40 % (ref 34.8–46.6)
HEMOGLOBIN: 13.1 g/dL (ref 11.6–15.9)
LYMPH%: 24.7 % (ref 14.0–49.7)
MCH: 33.3 pg (ref 25.1–34.0)
MCHC: 32.9 g/dL (ref 31.5–36.0)
MCV: 101.3 fL — AB (ref 79.5–101.0)
MONO#: 0.6 10*3/uL (ref 0.1–0.9)
MONO%: 7.8 % (ref 0.0–14.0)
NEUT%: 66.5 % (ref 38.4–76.8)
NEUTROS ABS: 5.1 10*3/uL (ref 1.5–6.5)
Platelets: 217 10*3/uL (ref 145–400)
RBC: 3.95 10*6/uL (ref 3.70–5.45)
RDW: 13.2 % (ref 11.2–14.5)
WBC: 7.7 10*3/uL (ref 3.9–10.3)
lymph#: 1.9 10*3/uL (ref 0.9–3.3)

## 2013-09-22 MED ORDER — SODIUM CHLORIDE 0.9 % IV SOLN
3.0000 mg | Freq: Once | INTRAVENOUS | Status: AC
  Administered 2013-09-22: 3 mg via INTRAVENOUS
  Filled 2013-09-22: qty 3.75

## 2013-09-22 MED ORDER — SODIUM CHLORIDE 0.9 % IV SOLN
INTRAVENOUS | Status: DC
  Administered 2013-09-22: 16:00:00 via INTRAVENOUS

## 2013-09-22 NOTE — Progress Notes (Signed)
ID: [name] OB: 07-07-1924  MR#: [id number]  WIO#:[id number]  PCP: [name], MD GYN:   SU:  OTHER MD: [name],  [name]  CHIEF COMPLAINT: "I am doing fine"  HISTORY OF PRESENT ILLNESS:  From doctor Livesay's intake note 02/23/2012:  "History is of T2NX intermediate grade invasive ductal carcinoma of right breast diagnosed with mammogram 11-04-2007 and biopsy 11-11-2007, ER + 98%, PR + 32 %, HER 2 was 3+ and proliferative index 14%. Preoperative MRI showed solitary mass. She had lumpectomy 12-27-2007 with 1.5 cm grade 3 invasive ductal carcinoma, no axillary nodes evaluated. She had RT to breast and axilla by Dr [name]. Patient was not inclined to pursue adjuvant chemotherapy or herceptin at diagnosis. She was on Femara from 03/2008 thru 11/2009 when this was changed to tamoxifen due to osteoporosis. She has tolerated the tamoxifen without difficulty. She has not had hysterectomy. Last mammograms were at [facility] 11-21-2011 with scattered fibroglandular densities and no mammographic findings of concern. She had unremarkable CXR with ED evaluation for infectious cough in Jan 2014."  The patient's subsequent history is as detailed below  INTERVAL HISTORY: Abbi returns today for followup of her breast cancer. She has been taking trips to [place] in [place] to visit family and also to [place] down to [place] which she greatly enjoyed. She's doing a lot of reading and she was able to give me a very good account of the bulk reading currently, called [facility]. She is tolerating tamoxifen with no side effects other than mild vaginal wetness, which she tells me is not a concern to her.  REVIEW OF SYSTEMS: A detailed review of systems today was otherwise entirely stable  PAST MEDICAL HISTORY: Past Medical History  Diagnosis Date  . Hypertension   . Hypercholesterolemia     on lipitor  . Right bundle branch block   . Breast cancer 12/11/2008  . Mitral valve  prolapse   . Fatigue 01/2012  . Palpitations 08/05/2000  . Senile osteoporosis 09/28/2001  . Other abnormal blood chemistry 01/21/2010  . Acute upper respiratory infections of unspecified site 06/09/2011    PAST SURGICAL HISTORY: Past Surgical History  Procedure Laterality Date  . Tonsillectomy    . Tubal ligation  1963  . Laparoscopic lysis of adhesions  07/15/2006    [name]  . Other surgical history    . Breast lumpectomy Right 12/27/2007    FAMILY HISTORY Family History  Problem Relation Age of Onset  . Stroke Mother   . Cancer Father     colon  . Alzheimer's disease Sister    the patient's father had a history of colon cancer. 2 of his sisters had breast cancer, but not diagnosed before age 59. The patient's mother lived to be 54. The patient's sister was diagnosed with breast cancer in her 62s.  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 Her husband, [name] was a pediatric surgeon and died earlier this year. Her son [name] of course is a urologist locally. The patient also has children in [place] and [place]. Her fourth child unfortunately died in a drowning accident. She has 7 grandchildren and 2 stepgrandchildren. She attends the [employer]    ADVANCED DIRECTIVES:    HEALTH MAINTENANCE: History  Substance Use Topics  . Smoking status: Former Smoker    Quit date: 08/06/1954  . Smokeless tobacco: Never  Used  . Alcohol Use: Yes     Comment: Occasionally      Colonoscopy:  PAP:  Bone density: November 2013/[facility]/T - 3.3  Lipid panel:  No Known Allergies  Current Outpatient Prescriptions  Medication Sig Dispense Refill  . Biotin 5000 MCG CAPS Take by mouth daily.      . cholecalciferol (VITAMIN D) 1000 UNITS tablet Take 1,000 Units by mouth daily. Calcium with vitamin D3 Take one tablet daily      . tamoxifen (NOLVADEX) 20 MG tablet TAKE 1  TABLET ONCE DAILY.  30 tablet  4  . valsartan (DIOVAN) 40 MG tablet Take 40 mg by mouth. Take one tablet daily. Take second tablet if SBP > 150, DBP > 100.       No current facility-administered medications for this visit.    OBJECTIVE: Elderly white woman in no acute distress Filed Vitals:   09/22/13 1410  BP: 141/71  Pulse: 71  Temp: 98.2 F (36.8 C)  Resp: 18     Body mass index is 19.6 kg/(m^2).    ECOG FS:0 - Asymptomatic  Ocular: Sclerae unicteric, pupils round and equal Ear-nose-throat: Oropharynx clear, teeth in good repair Lymphatic: No cervical or supraclavicular adenopathy Lungs no rales or rhonchi Heart regular rate and rhythm Abd soft, nontender, positive bowel sounds, no masses palpated MSK no focal spinal tenderness, no upper remedy lymphedema Neuro: non-focal, well-oriented, positive affect Breasts: The right breast is status post lumpectomy and radiation. There is no evidence of local recurrence. The right axilla is benign. The left breast is unremarkable.   LAB RESULTS:  CMP     Component Value Date/Time   NA 131* 03/22/2013 1435   NA 137 02/08/2013   NA 130* 01/29/2012 0917   K 4.6 03/22/2013 1435   K 4.2 02/08/2013   CL 98 02/17/2012 1500   CL 94* 01/29/2012 0917   CO2 28 03/22/2013 1435   CO2 29 01/29/2012 0917   GLUCOSE 92 03/22/2013 1435   GLUCOSE 134* 02/17/2012 1500   GLUCOSE 92 01/29/2012 0917   BUN 16.1 03/22/2013 1435   BUN 16 02/08/2013   BUN 16 01/29/2012 0917   CREATININE 0.8 03/22/2013 1435   CREATININE 0.8 02/08/2013   CREATININE 0.8 01/29/2012 0917   CALCIUM 9.2 03/22/2013 1435   CALCIUM 9.1 01/29/2012 0917   PROT 6.4 03/22/2013 1435   PROT 5.8* 07/22/2011 1304   ALBUMIN 3.8 03/22/2013 1435   ALBUMIN 4.0 07/22/2011 1304   AST 22 03/22/2013 1435   AST 20 07/22/2011 1304   ALT 16 03/22/2013 1435   ALT 11 07/22/2011 1304   ALKPHOS 52 03/22/2013 1435   ALKPHOS 39 07/22/2011 1304   BILITOT 0.42 03/22/2013 1435   BILITOT 0.4 07/22/2011 1304   GFRNONAA >60  12/23/2007 0935   GFRAA  Value: >60        The eGFR has been calculated using the MDRD equation. This calculation has not been validated in all clinical 12/23/2007 0935    I No results found for this basename: SPEP,  UPEP,   kappa and lambda light chains    Lab Results  Component Value Date   WBC 7.7 09/22/2013   NEUTROABS 5.1 09/22/2013   HGB 13.1 09/22/2013   HCT 40.0 09/22/2013   MCV 101.3* 09/22/2013   PLT 217 09/22/2013      Chemistry      Component Value Date/Time   NA 131* 03/22/2013 1435   NA 137 02/08/2013   NA 130* 01/29/2012  0917   K 4.6 03/22/2013 1435   K 4.2 02/08/2013   CL 98 02/17/2012 1500   CL 94* 01/29/2012 0917   CO2 28 03/22/2013 1435   CO2 29 01/29/2012 0917   BUN 16.1 03/22/2013 1435   BUN 16 02/08/2013   BUN 16 01/29/2012 0917   CREATININE 0.8 03/22/2013 1435   CREATININE 0.8 02/08/2013   CREATININE 0.8 01/29/2012 0917   GLU 94 02/08/2013      Component Value Date/Time   CALCIUM 9.2 03/22/2013 1435   CALCIUM 9.1 01/29/2012 0917   ALKPHOS 52 03/22/2013 1435   ALKPHOS 39 07/22/2011 1304   AST 22 03/22/2013 1435   AST 20 07/22/2011 1304   ALT 16 03/22/2013 1435   ALT 11 07/22/2011 1304   BILITOT 0.42 03/22/2013 1435   BILITOT 0.4 07/22/2011 1304       Lab Results  Component Value Date   LABCA2 33 02/17/2012    No components found with this basename: [id number]    No results found for this basename: INR,  in the last 168 hours  Urinalysis    Component Value Date/Time   COLORURINE YELLOW 12/23/2007 0945   APPEARANCEUR CLEAR 12/23/2007 0945   LABSPEC 1.018 12/23/2007 0945   PHURINE 7.0 12/23/2007 0945   GLUCOSEU NEGATIVE 12/23/2007 0945   HGBUR NEGATIVE 12/23/2007 0945   BILIRUBINUR NEGATIVE 12/23/2007 0945   KETONESUR NEGATIVE 12/23/2007 0945   PROTEINUR NEGATIVE 12/23/2007 0945   UROBILINOGEN 0.2 12/23/2007 0945   NITRITE NEGATIVE 12/23/2007 0945   LEUKOCYTESUR TRACE* 12/23/2007 0945    STUDIES: Mammography at [facility] is due November of this  year  ASSESSMENT: 80 y.o. [facility] resident status post right simple lumpectomy 12/27/2007 for a pT1c pNX, stage IA invasive ductal carcinoma, grade 3, estrogen receptor 98% positive, progesterone receptor 32% positive, with an MIB-1 of 14% and HercepTest positive at 3+  (1) declined adjuvant chemotherapy/trastuzumab  (2) completed adjuvant radiation to the right breast and axilla February 2010  (3) antiestrogen therapy: Anastrozole March 2010 through November 2011, at which point she switched to tamoxifen  (4) osteoporosis; most recent D[facility] scan at [facility] 11/21/2011 showed a T score of -3.3  (a) zolendronate yearly started 09/22/2013  PLAN: [place] is doing fine from a breast cancer point of view, with no evidence of disease recurrence. I think particularly given the issues with osteoporosis, it would be a good idea to continue the tamoxifen to a total of 10 years. She is tolerating it with no side effects other than minimal vaginal wetness, which is not a major issue for her.  She discussed zolendronate with our locums Dr Gladys Damme 6 months ago but never was scheduled for it. I went ahead and discussed bisphosphonates in general and she understands the difference between drugs like alendronate and ibandronate which are oral versus zolendronate or medronate which are intravenous. We tend to use of zolendronate, and I think once a year would be a good idea for her given the degree of her osteoporosis. We discussed the possible toxicities, side effects and complications including hypocalcemia (she will take extra calcium for the next 2 days), renal injury (I dropped the dose 25% given her creatinine clearance) and the rare case of osteonecrosis of the jaw, which would concern me she hasn't had any teeth extracted or implanted in the last year (all she had recently was a tooth Pap).  Laneshia has a good understanding of the overall plan. She agrees with it. She will see me again in one  year and  approximately a month before that we will repeat a bone density to see if we are making any headway. She knows to call for any problems that may develop before that visit.    Chauncey Cruel, MD   09/22/2013 2:19 PM

## 2013-09-22 NOTE — Patient Instructions (Signed)

## 2013-09-22 NOTE — Telephone Encounter (Signed)
per pof to sch pt appt-sent MW to sch zometa-sent GM email to put DEXA order in.Will call pt once reply

## 2013-09-23 LAB — VITAMIN D 25 HYDROXY (VIT D DEFICIENCY, FRACTURES): VIT D 25 HYDROXY: 48 ng/mL (ref 30–89)

## 2013-09-26 ENCOUNTER — Non-Acute Institutional Stay: Payer: Medicare Other | Admitting: Internal Medicine

## 2013-09-26 ENCOUNTER — Encounter: Payer: Self-pay | Admitting: Internal Medicine

## 2013-09-26 ENCOUNTER — Other Ambulatory Visit: Payer: Self-pay | Admitting: Oncology

## 2013-09-26 VITALS — BP 118/68 | HR 80 | Wt 105.0 lb

## 2013-09-26 DIAGNOSIS — M8580 Other specified disorders of bone density and structure, unspecified site: Secondary | ICD-10-CM

## 2013-09-26 DIAGNOSIS — I10 Essential (primary) hypertension: Secondary | ICD-10-CM

## 2013-09-26 DIAGNOSIS — R739 Hyperglycemia, unspecified: Secondary | ICD-10-CM

## 2013-09-26 DIAGNOSIS — E7800 Pure hypercholesterolemia, unspecified: Secondary | ICD-10-CM

## 2013-09-26 DIAGNOSIS — I8390 Asymptomatic varicose veins of unspecified lower extremity: Secondary | ICD-10-CM | POA: Insufficient documentation

## 2013-09-26 DIAGNOSIS — R609 Edema, unspecified: Secondary | ICD-10-CM

## 2013-09-26 DIAGNOSIS — R7309 Other abnormal glucose: Secondary | ICD-10-CM

## 2013-09-26 DIAGNOSIS — M810 Age-related osteoporosis without current pathological fracture: Secondary | ICD-10-CM

## 2013-09-26 NOTE — Progress Notes (Signed)
Patient ID: Cynthia Watkins, female   DOB: 17-Jul-1924, 80 y.o.   MRN: [id number]    Location:  [facility] (12)    No Known Allergies  Chief Complaint  Patient presents with  . Medical Management of Chronic Issues    blood pressure, cholesterol, hyperglycemia. Blood pressure's at home has been low 128/ 76 -100/60 so she has not been taking the Diovan.  On 09/17/13 that evening her BP was 142/85, she took one Diovan, the next morning her BP was 93/58.    HPI:  Essential hypertension: controlled. She stopped Valsartan several weeks ago, and the BP remained under good control.  Hypercholesterolemia: LDL  Little high, but so is the HDL and total cholesterol is only 199  Hyperglycemia: normal on last lab  Senile osteoporosis: Dr. Jana Hakim started zoledronic acid (Reclast) IV yearly.  Edema: re;lated to chronic venous insufficiency  Varicose vein of leg: chronic venous insufficiency    Medications: Patient's Medications  New Prescriptions   No medications on file  Previous Medications   BIOTIN 5000 MCG CAPS    Take by mouth daily.   CHOLECALCIFEROL (VITAMIN D) 1000 UNITS TABLET    Take 1,000 Units by mouth daily. Calcium with vitamin D3 Take one tablet daily   TAMOXIFEN (NOLVADEX) 20 MG TABLET    TAKE 1 TABLET ONCE DAILY.   VALSARTAN (DIOVAN) 40 MG TABLET    Take 40 mg by mouth. Take one tablet if SBP > 150, DBP > 100.  Modified Medications   No medications on file  Discontinued Medications   No medications on file     Review of Systems  Constitutional: Negative.   HENT: Negative.   Eyes: Positive for visual disturbance (corrective lenses).  Respiratory: Negative.   Cardiovascular: Positive for leg swelling (chronic venous insufficiency). Negative for chest pain and palpitations.  Gastrointestinal: Negative for abdominal pain and abdominal distention.  Endocrine: Negative.   Genitourinary: Negative.   Musculoskeletal:  Negative.   Skin: Negative.   Allergic/Immunologic: Negative.   Neurological: Negative.   Hematological: Negative.   Psychiatric/Behavioral: Negative.     Filed Vitals:   09/26/13 1343  BP: 118/68  Pulse: 80  Weight: 105 lb (47.628 kg)   Body mass index is 19.85 kg/(m^2).  Physical Exam  Constitutional: She is oriented to person, place, and time.  Thin. Elderly.  HENT:  Head: Normocephalic and atraumatic.  Right Ear: External ear normal.  Left Ear: External ear normal.  Nose: Nose normal.  Eyes: Conjunctivae and EOM are normal. Pupils are equal, round, and reactive to light.  Neck: No JVD present. No tracheal deviation present. No thyromegaly present.  Cardiovascular: Normal rate, regular rhythm, normal heart sounds and intact distal pulses.  Exam reveals no gallop and no friction rub.   No murmur heard. Pulmonary/Chest: No respiratory distress. She has no wheezes. She has no rales. She exhibits no tenderness.  Abdominal: She exhibits no distension and no mass. There is no tenderness.  Musculoskeletal: Normal range of motion. She exhibits edema. She exhibits no tenderness.  Lymphadenopathy:    She has no cervical adenopathy.  Neurological: She is alert and oriented to person, place, and time. She has normal reflexes. No cranial nerve deficit.  Skin: No rash noted. No erythema. No pallor.  Psychiatric: She has a normal mood and affect. Her behavior is normal. Judgment and thought content normal.     Labs reviewed: Nursing Home on 09/26/2013  Component Date Value Ref  Range Status  . Glucose 09/20/2013 81   Final  . BUN 09/20/2013 18  4 - 21 mg/dL Final  . Creatinine 09/20/2013 0.8  0.5 - 1.1 mg/dL Final  . Potassium 09/20/2013 4.2  3.4 - 5.3 mmol/L Final  . Sodium 09/20/2013 137  137 - 147 mmol/L Final  . LDl/HDL Ratio 09/20/2013 2.1   Final  . Triglycerides 09/20/2013 115  40 - 160 mg/dL Final  . Cholesterol 09/20/2013 199  0 - 200 mg/dL Final  . HDL 09/20/2013 65  35  - 70 mg/dL Final  . LDL Cholesterol 09/20/2013 139   Final  . Alkaline Phosphatase 09/20/2013 47  25 - 125 U/L Final  . ALT 09/20/2013 14  7 - 35 U/L Final  . AST 09/20/2013 22  13 - 35 U/L Final  . Bilirubin, Total 09/20/2013 0.6   Final  Appointment on 09/22/2013  Component Date Value Ref Range Status  . WBC 09/22/2013 7.7  3.9 - 10.3 10e3/uL Final  . NEUT# 09/22/2013 5.1  1.5 - 6.5 10e3/uL Final  . HGB 09/22/2013 13.1  11.6 - 15.9 g/dL Final  . HCT 09/22/2013 40.0  34.8 - 46.6 % Final  . Platelets 09/22/2013 217  145 - 400 10e3/uL Final  . MCV 09/22/2013 101.3* 79.5 - 101.0 fL Final  . MCH 09/22/2013 33.3  25.1 - 34.0 pg Final  . MCHC 09/22/2013 32.9  31.5 - 36.0 g/dL Final  . RBC 09/22/2013 3.95  3.70 - 5.45 10e6/uL Final  . RDW 09/22/2013 13.2  11.2 - 14.5 % Final  . lymph# 09/22/2013 1.9  0.9 - 3.3 10e3/uL Final  . MONO# 09/22/2013 0.6  0.1 - 0.9 10e3/uL Final  . Eosinophils Absolute 09/22/2013 0.0  0.0 - 0.5 10e3/uL Final  . Basophils Absolute 09/22/2013 0.1  0.0 - 0.1 10e3/uL Final  . NEUT% 09/22/2013 66.5  38.4 - 76.8 % Final  . LYMPH% 09/22/2013 24.7  14.0 - 49.7 % Final  . MONO% 09/22/2013 7.8  0.0 - 14.0 % Final  . EOS% 09/22/2013 0.3  0.0 - 7.0 % Final  . BASO% 09/22/2013 0.7  0.0 - 2.0 % Final  . Vit D, 25-Hydroxy 09/22/2013 48  30 - 89 ng/mL Final   Comment: This assay accurately quantifies Vitamin D, which is the sum of the25-Hydroxy forms of Vitamin D2 and D3.  Studies have shown that theoptimum concentration of 25-Hydroxy Vitamin D is 30 ng/mL or higher. Concentrations of Vitamin D between 20 and 29 ng/mL                           are considered tobe insufficient and concentrations less than 20 ng/mL are consideredto be deficient for Vitamin D.  . Sodium 09/22/2013 139  135 - 145 mEq/L Final  . Potassium 09/22/2013 4.1  3.5 - 5.3 mEq/L Final  . Chloride 09/22/2013 101  96 - 112 mEq/L Final  . CO2 09/22/2013 27  19 - 32 mEq/L Final  . Glucose, Bld 09/22/2013 86  70  - 99 mg/dL Final  . BUN 09/22/2013 19  6 - 23 mg/dL Final  . Creatinine, Ser 09/22/2013 0.70  0.50 - 1.10 mg/dL Final  . Total Bilirubin 09/22/2013 0.2* 0.2 - 1.2 mg/dL Final  . Alkaline Phosphatase 09/22/2013 51  39 - 117 U/L Final  . AST 09/22/2013 20  0 - 37 U/L Final  . ALT 09/22/2013 15  0 - 35 U/L Final  . Total  Protein 09/22/2013 6.6  6.0 - 8.3 g/dL Final  . Albumin 09/22/2013 3.6  3.5 - 5.2 g/dL Final  . Calcium 09/22/2013 9.2  8.4 - 10.5 mg/dL Final    Assessment/Plan  1. Essential hypertension Stay off the Valsartan.  2. Hypercholesterolemia Adequately controlled  3. Hyperglycemia Normal on the last lab  4. Senile osteoporosis Bone density next year  5. Edema Related to chronic venous insufficiency. Just observe.  6. Varicose vein of leg observe

## 2013-09-30 ENCOUNTER — Encounter: Payer: Self-pay | Admitting: Internal Medicine

## 2013-10-05 ENCOUNTER — Other Ambulatory Visit: Payer: Self-pay | Admitting: Oncology

## 2013-10-05 DIAGNOSIS — C50511 Malignant neoplasm of lower-outer quadrant of right female breast: Secondary | ICD-10-CM

## 2013-10-28 ENCOUNTER — Ambulatory Visit (INDEPENDENT_AMBULATORY_CARE_PROVIDER_SITE_OTHER): Payer: Medicare Other

## 2013-10-28 DIAGNOSIS — Z23 Encounter for immunization: Secondary | ICD-10-CM

## 2013-11-14 ENCOUNTER — Other Ambulatory Visit: Payer: Self-pay | Admitting: Emergency Medicine

## 2013-11-14 DIAGNOSIS — C50511 Malignant neoplasm of lower-outer quadrant of right female breast: Secondary | ICD-10-CM

## 2013-12-15 LAB — HM MAMMOGRAPHY: HM MAMMO: NORMAL

## 2013-12-16 ENCOUNTER — Other Ambulatory Visit: Payer: Self-pay | Admitting: *Deleted

## 2014-02-03 DIAGNOSIS — L578 Other skin changes due to chronic exposure to nonionizing radiation: Secondary | ICD-10-CM | POA: Diagnosis not present

## 2014-02-03 DIAGNOSIS — L814 Other melanin hyperpigmentation: Secondary | ICD-10-CM | POA: Diagnosis not present

## 2014-02-03 DIAGNOSIS — L570 Actinic keratosis: Secondary | ICD-10-CM | POA: Diagnosis not present

## 2014-02-07 DIAGNOSIS — E780 Pure hypercholesterolemia: Secondary | ICD-10-CM | POA: Diagnosis not present

## 2014-02-07 DIAGNOSIS — I10 Essential (primary) hypertension: Secondary | ICD-10-CM | POA: Diagnosis not present

## 2014-02-07 LAB — BASIC METABOLIC PANEL
BUN: 14 mg/dL (ref 4–21)
CREATININE: 0.8 mg/dL (ref 0.5–1.1)
GLUCOSE: 99 mg/dL
POTASSIUM: 4.7 mmol/L (ref 3.4–5.3)
Sodium: 135 mmol/L — AB (ref 137–147)

## 2014-02-07 LAB — HEPATIC FUNCTION PANEL
ALK PHOS: 35 U/L (ref 25–125)
ALT: 15 U/L (ref 7–35)
AST: 22 U/L (ref 13–35)
Bilirubin, Total: 0.5 mg/dL

## 2014-02-07 LAB — LIPID PANEL
Cholesterol: 201 mg/dL — AB (ref 0–200)
HDL: 66 mg/dL (ref 35–70)
LDL CALC: 132 mg/dL
LDl/HDL Ratio: 2
Triglycerides: 95 mg/dL (ref 40–160)

## 2014-02-13 ENCOUNTER — Encounter: Payer: Self-pay | Admitting: Internal Medicine

## 2014-02-14 ENCOUNTER — Encounter: Payer: Self-pay | Admitting: Internal Medicine

## 2014-02-14 ENCOUNTER — Non-Acute Institutional Stay: Payer: Medicare Other | Admitting: Internal Medicine

## 2014-02-14 VITALS — BP 102/64 | HR 76 | Temp 98.1°F | Ht 61.0 in | Wt 105.0 lb

## 2014-02-14 DIAGNOSIS — Z23 Encounter for immunization: Secondary | ICD-10-CM | POA: Diagnosis not present

## 2014-02-14 DIAGNOSIS — Z0000 Encounter for general adult medical examination without abnormal findings: Secondary | ICD-10-CM

## 2014-02-14 DIAGNOSIS — M21611 Bunion of right foot: Secondary | ICD-10-CM | POA: Insufficient documentation

## 2014-02-14 DIAGNOSIS — M2012 Hallux valgus (acquired), left foot: Secondary | ICD-10-CM

## 2014-02-14 DIAGNOSIS — M5441 Lumbago with sciatica, right side: Secondary | ICD-10-CM

## 2014-02-14 DIAGNOSIS — C50511 Malignant neoplasm of lower-outer quadrant of right female breast: Secondary | ICD-10-CM

## 2014-02-14 DIAGNOSIS — M2011 Hallux valgus (acquired), right foot: Secondary | ICD-10-CM

## 2014-02-14 DIAGNOSIS — M810 Age-related osteoporosis without current pathological fracture: Secondary | ICD-10-CM

## 2014-02-14 DIAGNOSIS — E7800 Pure hypercholesterolemia, unspecified: Secondary | ICD-10-CM

## 2014-02-14 DIAGNOSIS — M21612 Bunion of left foot: Secondary | ICD-10-CM

## 2014-02-14 DIAGNOSIS — R739 Hyperglycemia, unspecified: Secondary | ICD-10-CM

## 2014-02-14 DIAGNOSIS — H6123 Impacted cerumen, bilateral: Secondary | ICD-10-CM | POA: Diagnosis not present

## 2014-02-14 DIAGNOSIS — I10 Essential (primary) hypertension: Secondary | ICD-10-CM

## 2014-02-14 NOTE — Progress Notes (Signed)
Patient ID: Cynthia Watkins, female   DOB: 1924-10-10, 81 y.o.   MRN: [id number]   Location:  [facility]  Code Status:  She has a living will and hcpoa on file.                                                                        No Known Allergies  Chief Complaint  Patient presents with  . Annual Exam    Comprehensive exam: blood pressure, cholesterol, hyperglycemia  . pain buttock    right  down leg for several months all the time  . Foot Pain    right foot hurts can't sleep  . stool softener    wants to talk about start taking, stools hard  . Palpitations    on 02/09/14 at midnight while asleep notice heart racing, laid there after one hour, puls 92, BP 113/78; at 2:00am  pulse was 78. Not sure if it was the coffee she had with dinner.  . Medication Management    wants to talk about the blood pressure medication    HPI: Patient is a 81 y.o. female seen in the office today for her annual exam.  She also has several concerns that have accumulated over time to discuss with me today.  This is my first visit with her.    She lives in a [place].   Hyperlipidemia:  Much better than it once was:  Was over 300 she says, now 201 total and 132 LDL.  Pain in her right buttock and down the leg for a few months.  No known injury recently.  Had bursitis in the past somewhere-uncertain which side it was but shot gave her relief.  No pain in the lower back.  Sitting makes it worse.  Does a fair amount of sitting.  Is active though.  Has not taken any medications for this.  Not excruciating but is aggravating.    Right foot hurts with terrible bunions.  Just started to act up 3 days ago.  Periodic twinge through the night.  Hasn't taken anything for that either.    From [place], came to [place] for [employer] college, then to [employer], [place], married husband who was Energy manager, moved to [place] where he practiced.  One of children lives here so moved back.  Oldest son was a family  physician and now hospitalist.    Takes valsartan.  BP goes up and down a lot.  Takes only if over 150 or over 100.  Has palpitations, a bundle block and sees cardiology.  Discussed continuing same routine.    For several months, she has had difficulty excreting.  If she eats bean and prunes, it's the perfect consistency.  Says it's not a timing thing, but a consistency thing.  Tries to drink enough water.    02/09/14, went to bathroom at midnight.  Her heart was pounding.  Tried what cardiologist suggested like coughing, bearing down, etc.  Pulse 92 when got up, gradually did come down.  BP 113/78.  Does wonder if her coffee at supper did it (she gets decaf but someone else ordered caf). Finished by 7:30pm.      Review of Systems:  Review of Systems  Constitutional: Negative for weight loss.  HENT: Positive for hearing loss. Negative for congestion.        Her daughter has felt she has some hearing loss--had some wax in her ears.  Eyes: Positive for blurred vision.       Sees Dr. Ellie Lunch in 2 wks, has cataracts that are progressing  Respiratory: Negative for shortness of breath.   Cardiovascular: Positive for palpitations and leg swelling. Negative for chest pain.  Gastrointestinal: Positive for constipation. Negative for heartburn, blood in stool and melena.       Occasional blood from hemorrhoids  Genitourinary: Negative for dysuria and urgency.       More nocturia than she used to  Musculoskeletal: Negative for falls.  Skin: Negative for rash.  Neurological: Negative for dizziness.  Psychiatric/Behavioral: Negative for depression and memory loss. The patient does not have insomnia.      Past Medical History  Diagnosis Date  . Hypertension   . Hypercholesterolemia     on lipitor  . Right bundle branch block   . Breast cancer 12/11/2008  . Mitral valve prolapse   . Fatigue 01/2012  . Palpitations 08/05/2000  . Senile osteoporosis 09/28/2001  . Other abnormal blood chemistry  01/21/2010  . Acute upper respiratory infections of unspecified site 06/09/2011    Past Surgical History  Procedure Laterality Date  . Tonsillectomy    . Tubal ligation  1963  . Laparoscopic lysis of adhesions  07/15/2006    [name]  . Other surgical history    . Breast lumpectomy Right 12/27/2007    Social History:   reports that she quit smoking about 59 years ago. She has never used smokeless tobacco. She reports that she drinks alcohol. She reports that she does not use illicit drugs.  Family History  Problem Relation Age of Onset  . Stroke Mother   . Cancer Father     colon  . Alzheimer's disease Sister     Medications: Patient's Medications  New Prescriptions   No medications on file  Previous Medications   BIOTIN 5000 MCG CAPS    Take by mouth daily.   CALCIUM CARB-CHOLECALCIFEROL 600-800 MG-UNIT TABS    Take by mouth. Take one tablet daily   TAMOXIFEN (NOLVADEX) 20 MG TABLET    TAKE 1 TABLET ONCE DAILY.   VALSARTAN (DIOVAN) 40 MG TABLET    Take 40 mg by mouth. Take one tablet if SBP > 150, DBP > 100.  Modified Medications   No medications on file  Discontinued Medications   CHOLECALCIFEROL (VITAMIN D) 1000 UNITS TABLET    Take 1,000 Units by mouth daily. Calcium with vitamin D3 Take one tablet daily     Physical Exam: Filed Vitals:   02/14/14 1506  BP: 102/64  Pulse: 76  Temp: 98.1 F (36.7 C)  TempSrc: Oral  Height: 5\' 1"  (1.549 m)  Weight: 105 lb (47.628 kg)  SpO2: 99%   Physical Exam  Constitutional: She is oriented to person, place, and time. She appears well-developed and well-nourished. No distress.  HENT:  Head: Normocephalic and atraumatic.  Right Ear: External ear normal.  Left Ear: External ear normal.  Nose: Nose normal.  Mouth/Throat: Oropharynx is clear and moist. No oropharyngeal exudate.  Cerumen impaction of light colored cerumen, appears soft  Eyes: Conjunctivae and EOM are normal. Pupils are equal, round, and reactive to light.    glasses  Neck: Normal range of motion. Neck supple. No JVD present. No tracheal deviation present. No  thyromegaly present.  Cardiovascular: Normal rate, regular rhythm and intact distal pulses.   Midsystolic click  Pulmonary/Chest: Effort normal and breath sounds normal.  Abdominal: Soft. Bowel sounds are normal. She exhibits no distension and no mass. There is no tenderness.  Musculoskeletal: She exhibits no edema.  Bilateral great toe bunions, nontender at present; no reproducibility of right sciatica with straight leg raise or hip rotation  Lymphadenopathy:    She has no cervical adenopathy.  Neurological: She is alert and oriented to person, place, and time. She has normal reflexes. She displays normal reflexes. No cranial nerve deficit. She exhibits normal muscle tone. Coordination normal.  Skin: Skin is warm and dry.  Has bilateral varicose veins  Psychiatric: She has a normal mood and affect. Her behavior is normal. Judgment and thought content normal.    Labs reviewed: Basic Metabolic Panel:  Recent Labs  03/22/13 1435 09/20/13 09/22/13 1401 02/07/14  NA 131* 137 139 135*  K 4.6 4.2 4.1 4.7  CL  --   --  101  --   CO2 28  --  27  --   GLUCOSE 92  --  86  --   BUN 16.1 18 19 14   CREATININE 0.8 0.8 0.70 0.8  CALCIUM 9.2  --  9.2  --    Liver Function Tests:  Recent Labs  03/22/13 1435 09/20/13 09/22/13 1401 02/07/14  AST 22 22 20 22   ALT 16 14 15 15   ALKPHOS 52 47 51 35  BILITOT 0.42  --  0.2*  --   PROT 6.4  --  6.6  --   ALBUMIN 3.8  --  3.6  --    No results for input(s): LIPASE, AMYLASE in the last 8760 hours. No results for input(s): AMMONIA in the last 8760 hours. CBC:  Recent Labs  03/22/13 1435 09/22/13 1401  WBC 7.0 7.7  NEUTROABS 4.0 5.1  HGB 12.6 13.1  HCT 37.4 40.0  MCV 99.4 101.3*  PLT 262 217   Lipid Panel:  Recent Labs  09/20/13 02/07/14  CHOL 199 201*  HDL 65 66  LDLCALC 139 132  TRIG 115 95  Assessment/Plan 1. Right-sided  low back pain with right-sided sciatica -discussed that this seems to be neuropathic pain radiating from her back--she was not pleased to hear this--she was hoping it was something that would easily resolve with an injection like her bursitis did a few years ago -I recommended therapy if this pain persists or worsens -she already exercises regularly -she was made aware that there are medications especially for her neuropathic pain, but she does not think her pain is bad enough to take anything at present  2. Bilateral bunions -right one is painful intermittently and is a shooting pain -I wonder if that is not also due to her neuropathic pain from her back -advised to try hs tylenol 500mg  and see if that gives her some relief and allows her to sleep  3. Hyperglycemia -fasting glucose was 99 on labs, she is very active and exercises regularly, eats a healthy diet--cont this  4. Hypercholesterolemia -has improved dramatically over time, not on medications, cont diet and exercise for this at her age and functional level, HDL is at a protective level also  5. Breast cancer of lower-outer quadrant of right female breast -had a lumpectomy, follows with Dr. Jana Hakim, has had a mammogram in the past 6 mos, is on tamoxifen  6. Senile osteoporosis -has received reclast x 1 through Dr. Jana Hakim, will  get f/u bone density 1 year from that, cont ca with D supplement and weightbearing exercise  7. Essential hypertension -bp is well controlled--only takes prn diovan 40mg  for sbp >150 or dbp >100  8. Cerumen impaction, bilateral -advised to use debrox drops at hs  -if wax is not coming out, she will let us know so her ears can be flushed in the clinic  9. Routine general medical examination at a health care facility -up to date on mammogram MMSE 30/30, passed clock -she is functionally independent and ambulates w/o assistive device -pain as above -no falls or balance problems -physically active  with exercise at least 4x per week -PHQ-9 was negative  -last bone density was 11/26/11 and will get next one 1 year from her reclast that Dr. Jana Hakim ordered -got prevnar today -tdap Rx will be given at next visit -will also give zoster Rx next time  10. Need for vaccination with 13-polyvalent pneumococcal conjugate vaccine - Pneumococcal conjugate vaccine 13-valent was given today   Next appt:  6 mos  Alvis Edgell L. Coady Train, D.O. [facility] [address], [place] [address] Cell Phone (Mon-Fri 8am-5pm):  [phone] On Call:  [phone] & follow prompts after 5pm & weekends Office Phone:  [phone] Office Fax:  [phone]

## 2014-02-14 NOTE — Progress Notes (Signed)
Passed clock drawing 

## 2014-02-28 DIAGNOSIS — H2513 Age-related nuclear cataract, bilateral: Secondary | ICD-10-CM | POA: Diagnosis not present

## 2014-03-03 DIAGNOSIS — L578 Other skin changes due to chronic exposure to nonionizing radiation: Secondary | ICD-10-CM | POA: Diagnosis not present

## 2014-03-03 DIAGNOSIS — L570 Actinic keratosis: Secondary | ICD-10-CM | POA: Diagnosis not present

## 2014-03-23 DIAGNOSIS — H25811 Combined forms of age-related cataract, right eye: Secondary | ICD-10-CM | POA: Diagnosis not present

## 2014-03-23 DIAGNOSIS — H2511 Age-related nuclear cataract, right eye: Secondary | ICD-10-CM | POA: Diagnosis not present

## 2014-04-24 DIAGNOSIS — Z961 Presence of intraocular lens: Secondary | ICD-10-CM | POA: Diagnosis not present

## 2014-07-06 ENCOUNTER — Encounter: Payer: Self-pay | Admitting: Physician Assistant

## 2014-07-06 ENCOUNTER — Ambulatory Visit (INDEPENDENT_AMBULATORY_CARE_PROVIDER_SITE_OTHER): Payer: Medicare Other | Admitting: Physician Assistant

## 2014-07-06 VITALS — BP 132/82 | HR 64 | Ht 61.0 in | Wt 104.6 lb

## 2014-07-06 DIAGNOSIS — R002 Palpitations: Secondary | ICD-10-CM

## 2014-07-06 DIAGNOSIS — R011 Cardiac murmur, unspecified: Secondary | ICD-10-CM

## 2014-07-06 MED ORDER — METOPROLOL TARTRATE 25 MG PO TABS: 25.0000 mg | ORAL_TABLET | Freq: Two times a day (BID) | ORAL | Status: DC | PRN

## 2014-07-06 NOTE — Progress Notes (Signed)
Cardiology Office Note   Date:  07/06/2014   ID:  Cynthia Watkins, DOB December 17, 1924, MRN [id number]  PCP:  [name], DO  Cardiologist:   Dr. Martinique  Saniya Tranchina, PA-C   Chief Complaint  Patient presents with  . Follow-up    One episode of irregular heart beats which started while reading in bed.  She jumped up took her BP which she states was elevated above her normal by 30 points.  Occas. feels lightheaded and feels like she might blackout - she takes a deep breath and symptoms resolve.    History of Present Illness: Cynthia Watkins is a 81 y.o. female with a history of hypertension, right bundle branch block , breast cancer, mitral valve prolapse and palpitations.  Cynthia Watkins presents for  Evaluation of an episode of palpitations that she had recently.   Cynthia Watkins has had 3 episodes where she felt faint while sitting. She did not pass out, she did not have any palpitations. She did not have any chest pain or shortness of breath. Her symptoms resolved without intervention. Her systolic blood pressure was between 120 and 145. Her heart rate was in the 60s when she checked her vital signs just after these incidents.   On 06/24 , she had an episode of her heart beating erratically that started spontaneously, and resolved spontaneously. The symptoms resolved after a brief period of time, she cannot say how long they lasted exactly. She took her blood pressure shortly after symptoms resolved, her blood pressure was 158/84 and her heart rate was 67. At the time she checked her vital signs, but the palpitations had resolved.  She has never had palpitations like this before and she was concerned by them.   When her systolic blood pressures over 628 or her diastolic pressures over 366, she takes valsartan 40 mg. She generally does this one or 2 times per week. She took one at night.   Past Medical History  Diagnosis Date  . Hypertension   . Hypercholesterolemia     on lipitor  .  Right bundle branch block   . Breast cancer 12/11/2008  . Mitral valve prolapse   . Fatigue 01/2012  . Palpitations 08/05/2000  . Senile osteoporosis 09/28/2001  . Other abnormal blood chemistry 01/21/2010  . Acute upper respiratory infections of unspecified site 06/09/2011    Past Surgical History  Procedure Laterality Date  . Tonsillectomy    . Tubal ligation  1963  . Laparoscopic lysis of adhesions  07/15/2006    [name]  . Other surgical history    . Breast lumpectomy Right 12/27/2007    Current Outpatient Prescriptions  Medication Sig Dispense Refill  . Biotin 5000 MCG CAPS Take by mouth daily.    . Calcium Carb-Cholecalciferol 600-800 MG-UNIT TABS Take by mouth. Take one tablet daily    . metoprolol tartrate (LOPRESSOR) 25 MG tablet Take 1 tablet (25 mg total) by mouth 2 (two) times daily as needed. 60 tablet 3  . tamoxifen (NOLVADEX) 20 MG tablet TAKE 1 TABLET ONCE DAILY. 30 tablet PRN  . valsartan (DIOVAN) 40 MG tablet Take 40 mg by mouth. Take one tablet if SBP > 150, DBP > 100.     No current facility-administered medications for this visit.    Allergies:   Review of patient's allergies indicates no known allergies.    Social History:  The patient  reports that she quit smoking about 59 years ago. She has never  used smokeless tobacco. She reports that she drinks alcohol. She reports that she does not use illicit drugs.   Family History:  The patient's family history includes Alzheimer's disease in her sister; Cancer in her father; Stroke in her mother.    ROS:  Please see the history of present illness. All other systems are reviewed and negative.    PHYSICAL EXAM: VS:  BP 132/82 mmHg  Pulse 64  Ht 5\' 1"  (1.549 m)  Wt 104 lb 9.6 oz (47.446 kg)  BMI 19.77 kg/m2 , BMI Body mass index is 19.77 kg/(m^2). GEN: Well nourished, well developed, in no acute distress HEENT: normal Neck: no JVD, carotid bruits, or masses Cardiac: RRR;  3/6  murmurs, rubs, or gallops,no  edema. Distal pulses are 2+ in all 4 extremities  Respiratory:  clear to auscultation bilaterally, normal work of breathing GI: soft, nontender, nondistended, + BS MS: no deformity or atrophy Skin: warm and dry, no rash Neuro:  Strength and sensation are intact Psych: euthymic mood, full affect   EKG:  EKG is ordered today. The ekg ordered today demonstrates  Sinus rhythm with right bundle branch block which is old   Recent Labs: 09/22/2013: HGB 13.1; Platelets 217 02/07/2014: ALT 15; BUN 14; Creatinine 0.8; Potassium 4.7; Sodium 135*    Lipid Panel    Component Value Date/Time   CHOL 201* 02/07/2014   TRIG 95 02/07/2014   HDL 66 02/07/2014   LDLCALC 132 02/07/2014     Wt Readings from Last 3 Encounters:  07/06/14 104 lb 9.6 oz (47.446 kg)  02/14/14 105 lb (47.628 kg)  09/26/13 105 lb (47.628 kg)     Other studies Reviewed: Additional studies/ records that were reviewed today include:  Previous office records and ECGs.  ASSESSMENT AND PLAN:  1.   Palpitations: Cynthia Watkins wishes to get these evaluated and to that end I have ordered a 30 day event monitor. Metoprolol 25 mg when necessary but up to twice a day has been ordered. She is to take this instead of the losartan if she has additional palpitations or if her blood pressure goes up.   2.  presyncope:  She will get the event monitor and document any future symptoms.   3. Systolic murmur: she has a history of MVP. Her murmur is  loud now and a 2-D echocardiogram was ordered for further evaluation   Current medicines are reviewed at length with the patient today.  The patient does not have concerns regarding medicines.  The following changes have been made:   Change losartan to metoprolol 25 mg twice a day when necessary   Orders Placed This Encounter  Procedures  . Cardiac event monitor  . EKG 12-Lead  . ECHOCARDIOGRAM COMPLETE     Disposition:   FU with  Dr. Martinique in 6 weeks   Signed, Lenoard Aden    07/06/2014 7:46 PM    [facility], [place], [place]  [address] Phone: [phone]; Fax: [phone]

## 2014-07-06 NOTE — Patient Instructions (Signed)
Medication Instructions:   TAKE METOPROLOL TART. 25MG  TWICE A DAY AS NEEDED FOR PALPITATIONS  Labwork:  NONE  Testing/Procedures:  Your physician has requested that you have an echocardiogram. Echocardiography is a painless test that uses sound waves to create images of your heart. It provides your doctor with information about the size and shape of your heart and how well your heart's chambers and valves are working. This procedure takes approximately one hour. There are no restrictions for this procedure.  Your physician has recommended that you wear a 30 DAY event monitor THIS WILL BE SCHEDULED AT [facility]. Event monitors are medical devices that record the heart's electrical activity. Doctors most often [place] these monitors to diagnose arrhythmias. Arrhythmias are problems with the speed or rhythm of the heartbeat. The monitor is a small, portable device. You can wear one while you do your normal daily activities. This is usually used to diagnose what is causing palpitations/syncope (passing out).    Follow-Up:  [place] [name]

## 2014-07-13 ENCOUNTER — Ambulatory Visit (HOSPITAL_COMMUNITY)
Admission: RE | Admit: 2014-07-13 | Discharge: 2014-07-13 | Disposition: A | Discharge status: Home / Self Care | Payer: Medicare Other | Source: Ambulatory Visit | Attending: Physician Assistant | Admitting: Physician Assistant

## 2014-07-13 DIAGNOSIS — I340 Nonrheumatic mitral (valve) insufficiency: Secondary | ICD-10-CM | POA: Diagnosis not present

## 2014-07-13 DIAGNOSIS — I351 Nonrheumatic aortic (valve) insufficiency: Secondary | ICD-10-CM | POA: Insufficient documentation

## 2014-07-13 DIAGNOSIS — I071 Rheumatic tricuspid insufficiency: Secondary | ICD-10-CM | POA: Insufficient documentation

## 2014-07-13 DIAGNOSIS — R011 Cardiac murmur, unspecified: Secondary | ICD-10-CM | POA: Diagnosis not present

## 2014-07-14 ENCOUNTER — Ambulatory Visit (INDEPENDENT_AMBULATORY_CARE_PROVIDER_SITE_OTHER): Payer: Medicare Other

## 2014-07-14 DIAGNOSIS — R002 Palpitations: Secondary | ICD-10-CM

## 2014-07-17 ENCOUNTER — Telehealth: Payer: Self-pay | Admitting: Cardiology

## 2014-07-17 NOTE — Telephone Encounter (Signed)
Pt said she had some episodes with her heart,she wants to talk to you about this.

## 2014-07-17 NOTE — Telephone Encounter (Signed)
Returned call to patient she stated she had 30 day event monitor put on 07/14/14.Stated she had 2 episodes of feeling faint on 07/15/14 and 07/16/14.Stated she took metoprolol 25 mg twice.She feels tired this morning, no faint feeling.Advised Dr.Jordan out of office this afternoon.I will obtain monitor strips from 7/9 and 07/16/14 and speak to [place] 07/18/14.

## 2014-07-18 NOTE — Telephone Encounter (Signed)
Returned call to patient no answer.LMTC. 

## 2014-07-20 NOTE — Telephone Encounter (Signed)
Returned call to patient no answer.[place].  Patient's son was called he will have mother to call me back 07/24/14.

## 2014-07-26 ENCOUNTER — Non-Acute Institutional Stay: Payer: Medicare Other | Admitting: Internal Medicine

## 2014-07-26 ENCOUNTER — Encounter: Payer: Self-pay | Admitting: Internal Medicine

## 2014-07-26 VITALS — BP 126/84 | HR 72 | Temp 98.3°F | Wt 106.0 lb

## 2014-07-26 DIAGNOSIS — R531 Weakness: Secondary | ICD-10-CM | POA: Diagnosis not present

## 2014-07-26 DIAGNOSIS — R002 Palpitations: Secondary | ICD-10-CM

## 2014-07-26 DIAGNOSIS — R5383 Other fatigue: Secondary | ICD-10-CM | POA: Diagnosis not present

## 2014-07-26 NOTE — Progress Notes (Signed)
Patient ID: Cynthia Watkins, female   DOB: Jun 02, 1924, 81 y.o.   MRN: [id number]   Location:  [facility]   Goals of Care:Advanced Directive information Does patient have an advance directive?: Yes, Type of Advance Directive: [place];Living will, Does patient want to make changes to advanced directive?: No - Patient declined  Chief Complaint  Patient presents with  . Anemia    rule out. Patient has 2 sons that are doctors, they think she needs lab to rule out anemia. Last week had no energy, one day felt dizzy. Saw Dr. Martinique, did 2-D echo, has 30 day monitor on now.     HPI: Patient is a 81 y.o. white female seen in the [facility] today due to request for labs b/c of fatigue, weakness, erratic heart beats and concern for anemia.  Palpitations, sob, near syncope--several events over the years, but had not had any recently until a couple weeks ago.  Also had raced occasionally.  Had erratic episode up to 160.  She told her son who recommended she see cardiology.   Has had two episodes since putting on the 30 day monitor.  She notified the cardiologist that she felt like her heart didn't beat like it should have.  Cardiology reviewed the strip so far and it was unrevealing.  Has metoprolol tartrate twice a day as needed.  Has used it just once.  Reviewed her echocardiogram with her which showed essentially normal systolic and diastolic function.  Also she had some mild to moderate mitral and tricuspid regurgitation, mild aortic regurgitation.    Older son who is a hospitalist wondered if she was anemic.   Was more fatigued last week.  Feels a bit better this week.  Does not eat beef, but does eat a lot of spinach.    Has a big appetite and eats well.    Review of Systems:  Review of Systems  Constitutional: Positive for malaise/fatigue. Negative for fever and chills.  HENT: Negative for congestion.   Respiratory: Negative for cough and shortness of  breath.   Cardiovascular: Positive for palpitations. Negative for chest pain, orthopnea, claudication, leg swelling and PND.  Gastrointestinal: Negative for abdominal pain.  Genitourinary: Negative for dysuria, urgency and frequency.  Musculoskeletal: Negative for falls.  Neurological: Positive for dizziness and weakness. Negative for loss of consciousness and headaches.  Psychiatric/Behavioral: Negative for memory loss.    Past Medical History  Diagnosis Date  . Hypertension   . Hypercholesterolemia     on lipitor  . Right bundle branch block   . Breast cancer 12/11/2008  . Mitral valve prolapse   . Fatigue 01/2012  . Palpitations 08/05/2000  . Senile osteoporosis 09/28/2001  . Other abnormal blood chemistry 01/21/2010  . Acute upper respiratory infections of unspecified site 06/09/2011    Past Surgical History  Procedure Laterality Date  . Tonsillectomy    . Tubal ligation  1963  . Laparoscopic lysis of adhesions  07/15/2006    [name]  . Other surgical history    . Breast lumpectomy Right 12/27/2007    Social History:   reports that she quit smoking about 60 years ago. She has never used smokeless tobacco. She reports that she drinks alcohol. She reports that she does not use illicit drugs.  No Known Allergies  Medications: Patient's Medications  New Prescriptions   No medications on file  Previous Medications   BIOTIN 5000 MCG CAPS    Take by mouth  daily.   CALCIUM CARB-CHOLECALCIFEROL 600-800 MG-UNIT TABS    Take by mouth. Take one tablet daily   METOPROLOL TARTRATE (LOPRESSOR) 25 MG TABLET    Take 1 tablet (25 mg total) by mouth 2 (two) times daily as needed.   TAMOXIFEN (NOLVADEX) 20 MG TABLET    TAKE 1 TABLET ONCE DAILY.   VALSARTAN (DIOVAN) 40 MG TABLET    Take 40 mg by mouth. Take one tablet if SBP > 150, DBP > 100.  Modified Medications   No medications on file  Discontinued Medications   No medications on file     Physical Exam: Filed Vitals:   07/26/14  1619  BP: 126/84  Pulse: 72  Temp: 98.3 F (36.8 C)  TempSrc: Oral  Weight: 106 lb (48.081 kg)  SpO2: 99%   Body mass index is 20.04 kg/(m^2). Physical Exam  Constitutional: She is oriented to person, place, and time.  Thin white female  Cardiovascular: Normal rate, regular rhythm, normal heart sounds and intact distal pulses.   Pulmonary/Chest: Effort normal and breath sounds normal. No respiratory distress. She has no rales.  Musculoskeletal: Normal range of motion.  Ambulate without assistive device  Neurological: She is alert and oriented to person, place, and time.     Labs reviewed: Basic Metabolic Panel:  Recent Labs  09/22/13 1401 02/07/14 07/27/14  NA 139 135* 138  K 4.1 4.7 4.8  CL 101  --   --   CO2 27  --   --   GLUCOSE 86  --   --   BUN 19 14 18   CREATININE 0.70 0.8 0.8  CALCIUM 9.2  --   --    Liver Function Tests:  Recent Labs  09/22/13 1401 02/07/14 07/27/14  AST 20 22 19   ALT 15 15 15   ALKPHOS 51 35 38  BILITOT 0.2*  --   --   PROT 6.6  --   --   ALBUMIN 3.6  --   --    No results for input(s): LIPASE, AMYLASE in the last 8760 hours. No results for input(s): AMMONIA in the last 8760 hours. CBC:  Recent Labs  09/22/13 1401 07/27/14  WBC 7.7 6.5  NEUTROABS 5.1  --   HGB 13.1 13.8  HCT 40.0 39  MCV 101.3*  --   PLT 217 273   Lipid Panel:  Recent Labs  09/20/13 02/07/14 07/27/14  CHOL 199 201* 194  HDL 65 66 69  LDLCALC 139 132 118  TRIG 115 95 90   No results found for: HGBA1C  Procedures since last appt: Reviewed cardiology notes, EKG 6/30, echo 7/7    Patient Care Team: Gayland Curry, DO as PCP - General (Geriatric Medicine) Prentiss Bells, MD as Consulting Physician (Ophthalmology) Peter M Martinique, MD as Consulting Physician (Cardiology) Allyn Kenner, MD as Consulting Physician (Dermatology) Michael Boston, MD as Consulting Physician (General Surgery) [facility] Amada Kingfisher, MD as Consulting Physician  (Hematology and Oncology) Chauncey Cruel, MD as Consulting Physician (Oncology)  Assessment/Plan 1. Palpitations -is completing 30 day heart monitor -had a couple of episodes of symptoms, but when they were reviewed by cardiology, she reports her rhythm was normal -await final results  2. Generalized weakness -in process of ruling out afib or other arrhythmia as cause with heart monitor -will obtain cbc with diff  to r/o anemia and has not had recent bmp, lipids, hepatic functions so will check these as well -may be a result of her  age of 70  3. Other fatigue -again, check labs, await heart monitor results  Labs/tests ordered: cbc, bmp, lipid, hepatic functions Next appt:  Keep 8/10 appt  Yunuen Mordan L. Renesmae Donahey, D.O. [facility] [address], [place] [address] Cell Phone (Mon-Fri 8am-5pm):  [phone] On Call:  [phone] & follow prompts after 5pm & weekends Office Phone:  [phone] Office Fax:  [phone]

## 2014-07-27 DIAGNOSIS — E780 Pure hypercholesterolemia: Secondary | ICD-10-CM | POA: Diagnosis not present

## 2014-07-27 DIAGNOSIS — R5383 Other fatigue: Secondary | ICD-10-CM | POA: Diagnosis not present

## 2014-07-27 DIAGNOSIS — I10 Essential (primary) hypertension: Secondary | ICD-10-CM | POA: Diagnosis not present

## 2014-07-27 LAB — BASIC METABOLIC PANEL
BUN: 18 mg/dL (ref 4–21)
Creatinine: 0.8 mg/dL (ref 0.5–1.1)
Glucose: 100 mg/dL
Potassium: 4.8 mmol/L (ref 3.4–5.3)
Sodium: 138 mmol/L (ref 137–147)

## 2014-07-27 LAB — HEPATIC FUNCTION PANEL
ALT: 15 U/L (ref 7–35)
AST: 19 U/L (ref 13–35)
Alkaline Phosphatase: 38 U/L (ref 25–125)
Bilirubin, Total: 0.5 mg/dL

## 2014-07-27 LAB — LIPID PANEL
Cholesterol: 194 mg/dL (ref 0–200)
HDL: 69 mg/dL (ref 35–70)
LDL Cholesterol: 118 mg/dL
TRIGLYCERIDES: 90 mg/dL (ref 40–160)

## 2014-07-27 LAB — CBC AND DIFFERENTIAL
HEMATOCRIT: 39 % (ref 36–46)
Hemoglobin: 13.8 g/dL (ref 12.0–16.0)
PLATELETS: 273 10*3/uL (ref 150–399)
WBC: 6.5 10^3/mL

## 2014-07-28 ENCOUNTER — Telehealth: Payer: Self-pay

## 2014-07-28 ENCOUNTER — Encounter: Payer: Self-pay | Admitting: *Deleted

## 2014-07-28 NOTE — Telephone Encounter (Signed)
Received monitor strips from 07/15/14 and 07/16/14.Dr.Jordan reviewed on 07/18/14 which revealed sinus tachycardia rate 104 to 111, sinus bradycardia rate 54 to 55.Dr.Jordan advised continue to wear monitor.Patient never returned call.

## 2014-07-28 NOTE — Telephone Encounter (Signed)
Called patient about labs 07/27/14 -bad cholesterol is elevated, blood cholesterol is at protective level, glucose also elevated. Watch fatty foods, sweets. Keep appt 08/16/14.

## 2014-07-28 NOTE — Telephone Encounter (Signed)
Patient never returned phone call

## 2014-08-02 ENCOUNTER — Encounter: Payer: Self-pay | Admitting: Internal Medicine

## 2014-08-15 ENCOUNTER — Encounter: Payer: Self-pay | Admitting: Internal Medicine

## 2014-08-16 ENCOUNTER — Non-Acute Institutional Stay: Payer: Medicare Other | Admitting: Internal Medicine

## 2014-08-16 ENCOUNTER — Encounter: Payer: Self-pay | Admitting: Internal Medicine

## 2014-08-16 VITALS — BP 110/70 | HR 80 | Temp 98.0°F | Wt 106.0 lb

## 2014-08-16 DIAGNOSIS — H00016 Hordeolum externum left eye, unspecified eyelid: Secondary | ICD-10-CM | POA: Diagnosis not present

## 2014-08-16 DIAGNOSIS — R002 Palpitations: Secondary | ICD-10-CM | POA: Diagnosis not present

## 2014-08-16 DIAGNOSIS — E7800 Pure hypercholesterolemia, unspecified: Secondary | ICD-10-CM

## 2014-08-16 DIAGNOSIS — C50511 Malignant neoplasm of lower-outer quadrant of right female breast: Secondary | ICD-10-CM

## 2014-08-16 DIAGNOSIS — R531 Weakness: Secondary | ICD-10-CM

## 2014-08-16 DIAGNOSIS — R739 Hyperglycemia, unspecified: Secondary | ICD-10-CM | POA: Diagnosis not present

## 2014-08-16 DIAGNOSIS — I10 Essential (primary) hypertension: Secondary | ICD-10-CM | POA: Diagnosis not present

## 2014-08-16 NOTE — Progress Notes (Signed)
Patient ID: Cynthia Watkins, female   DOB: 1924/09/13, 81 y.o.   MRN: [id number]   Location:  [facility]  Code Status: DNR  Goals of Care:Advanced Directive information Does patient have an advance directive?: Yes, Type of Advance Directive: [place];Living will  Chief Complaint  Patient presents with  . Medical Management of Chronic Issues    blood pressure, cholesterol, palpitations, weakness  . upper eye lid    sore on it for one week    HPI: Patient is a 81 y.o. white female seen in the [facility] today for medical mgt of her chronic diseases.    Her bp is well controlled with her diet, exercise, and valsartan.  Palpitations:  Her heart monitor showed some brady and tachy, but all sinus rhythm.  She notes she's only used the prn metoprolol 2x.    Left eyelid sore for one week.  No drainage.  Vision is poor, but not related.  Had right cataract removed a few mos ago, but says her glasses haven't really been right.  Needs left cataract done.  Has put a warm compress on the eye 1-2 x but nothing consistently.  Participated in a shingles trial and got vaccine 15 years ago--she is certain of this, but does not know what day/month.    Continues on tamoxifen due to right breast ca that was ER positive. Last mammo nl 12/15/13.  Review of Systems:  Review of Systems  Constitutional: Negative for fever, chills and malaise/fatigue.  HENT: Negative for congestion and hearing loss.   Eyes: Positive for blurred vision.       Left eye cataract; left eye stye; wears glasses, but not seeing well anyway  Respiratory: Negative for shortness of breath.   Cardiovascular: Positive for palpitations. Negative for chest pain and leg swelling.       Just two episodes since metoprolol prn was prescribed for her  Gastrointestinal: Negative for abdominal pain, constipation, blood in stool and melena.  Genitourinary: Negative for dysuria, urgency and frequency.    Musculoskeletal: Negative for falls.  Neurological: Negative for dizziness, loss of consciousness and weakness.  Psychiatric/Behavioral: Negative for depression and memory loss. The patient is not nervous/anxious and does not have insomnia.     Past Medical History  Diagnosis Date  . Hypertension   . Hypercholesterolemia     on lipitor  . Right bundle branch block   . Breast cancer 12/11/2008  . Mitral valve prolapse   . Fatigue 01/2012  . Palpitations 08/05/2000  . Senile osteoporosis 09/28/2001  . Other abnormal blood chemistry 01/21/2010  . Acute upper respiratory infections of unspecified site 06/09/2011    Past Surgical History  Procedure Laterality Date  . Tonsillectomy    . Tubal ligation  1963  . Laparoscopic lysis of adhesions  07/15/2006    [name]  . Other surgical history    . Breast lumpectomy Right 12/27/2007    Social History:   reports that she quit smoking about 60 years ago. She has never used smokeless tobacco. She reports that she drinks alcohol. She reports that she does not use illicit drugs.  No Known Allergies  Medications: Patient's Medications  New Prescriptions   No medications on file  Previous Medications   BIOTIN 5000 MCG CAPS    Take by mouth daily.   CALCIUM CARB-CHOLECALCIFEROL 600-800 MG-UNIT TABS    Take by mouth. Take one tablet daily   METOPROLOL TARTRATE (LOPRESSOR) 25 MG TABLET  Take 1 tablet (25 mg total) by mouth 2 (two) times daily as needed.   TAMOXIFEN (NOLVADEX) 20 MG TABLET    TAKE 1 TABLET ONCE DAILY.   VALSARTAN (DIOVAN) 40 MG TABLET    Take 40 mg by mouth. Take one tablet if SBP > 150, DBP > 100.  Modified Medications   No medications on file  Discontinued Medications   No medications on file     Physical Exam: Filed Vitals:   08/16/14 1335  BP: 110/70  Pulse: 80  Temp: 98 F (36.7 C)  TempSrc: Oral  Weight: 106 lb (48.081 kg)  SpO2: 96%   Body mass index is 20.04 kg/(m^2). Physical Exam  Constitutional:  She is oriented to person, place, and time. She appears well-developed and well-nourished. No distress.  Eyes: EOM are normal. Pupils are equal, round, and reactive to light.  Left upper lid stye  Cardiovascular: Normal rate, regular rhythm, normal heart sounds and intact distal pulses.   Pulmonary/Chest: Effort normal and breath sounds normal. No respiratory distress.  Musculoskeletal: Normal range of motion.  Neurological: She is alert and oriented to person, place, and time.  Skin:  Left eye erythema medially with small stye on upper lid that is tender, no drainage visible  Psychiatric: She has a normal mood and affect. Her behavior is normal. Judgment and thought content normal.     Labs reviewed: Basic Metabolic Panel:  Recent Labs  09/22/13 1401 02/07/14 07/27/14  NA 139 135* 138  K 4.1 4.7 4.8  CL 101  --   --   CO2 27  --   --   GLUCOSE 86  --   --   BUN 19 14 18   CREATININE 0.70 0.8 0.8  CALCIUM 9.2  --   --    Liver Function Tests:  Recent Labs  09/22/13 1401 02/07/14 07/27/14  AST 20 22 19   ALT 15 15 15   ALKPHOS 51 35 38  BILITOT 0.2*  --   --   PROT 6.6  --   --   ALBUMIN 3.6  --   --    No results for input(s): LIPASE, AMYLASE in the last 8760 hours. No results for input(s): AMMONIA in the last 8760 hours. CBC:  Recent Labs  09/22/13 1401 07/27/14  WBC 7.7 6.5  NEUTROABS 5.1  --   HGB 13.1 13.8  HCT 40.0 39  MCV 101.3*  --   PLT 217 273   Lipid Panel:  Recent Labs  09/20/13 02/07/14 07/27/14  CHOL 199 201* 194  HDL 65 66 69  LDLCALC 139 132 118  TRIG 115 95 90   Procedures since last appt: 30 day heart monitor reviewed with patient.  Patient Care Team: Gayland Curry, DO as PCP - General (Geriatric Medicine) Prentiss Bells, MD as Consulting Physician (Ophthalmology) Peter M Martinique, MD as Consulting Physician (Cardiology) Allyn Kenner, MD as Consulting Physician (Dermatology) Michael Boston, MD as Consulting Physician (General  Surgery) [facility] Amada Kingfisher, MD as Consulting Physician (Hematology and Oncology) Chauncey Cruel, MD as Consulting Physician (Oncology)  Assessment/Plan 1. Palpitations -two episodes requiring metoprolol since prescribed -30 day monitor was unremarkable  2. Stye external, left -advised to use warm compresses 3x daily for 10-15 min intervals  And let me know if it's not getting better -needs to see ophtho for cataract surgery on the left eye  3. Generalized weakness -improved, no changes needed, cont healthy diet and exercise  4. Hyperglycemia -glucose  was 100 on her labs, she eats a healthy diet and is a healthy weight--monitor  5. Hypercholesterolemia -elevated LDL, but HDL protective, no changes  6. Breast cancer of lower-outer quadrant of right female breast -cont tamoxifen as per Dr. Jana Hakim for 10 years -previously was on femara 3/10-11/11  7. Essential hypertension -bp at goal with valsartan, no changes  Labs/tests ordered:  Cbc, cmp before Next appt:  6 mos for annual exam  Ryliegh Mcduffey L. Iola Turri, D.O. [facility] [address], [place] [address] Cell Phone (Mon-Fri 8am-5pm):  [phone] On Call:  [phone] & follow prompts after 5pm & weekends Office Phone:  [phone] Office Fax:  [phone]

## 2014-08-21 DIAGNOSIS — H0014 Chalazion left upper eyelid: Secondary | ICD-10-CM | POA: Diagnosis not present

## 2014-09-15 ENCOUNTER — Other Ambulatory Visit: Payer: Self-pay | Admitting: *Deleted

## 2014-09-15 DIAGNOSIS — C50511 Malignant neoplasm of lower-outer quadrant of right female breast: Secondary | ICD-10-CM

## 2014-09-18 ENCOUNTER — Ambulatory Visit (INDEPENDENT_AMBULATORY_CARE_PROVIDER_SITE_OTHER): Payer: Medicare Other | Admitting: Cardiology

## 2014-09-18 ENCOUNTER — Encounter: Payer: Self-pay | Admitting: Cardiology

## 2014-09-18 ENCOUNTER — Other Ambulatory Visit (HOSPITAL_BASED_OUTPATIENT_CLINIC_OR_DEPARTMENT_OTHER): Payer: Medicare Other

## 2014-09-18 VITALS — BP 102/68 | HR 68 | Ht 61.0 in | Wt 103.0 lb

## 2014-09-18 DIAGNOSIS — I4510 Unspecified right bundle-branch block: Secondary | ICD-10-CM

## 2014-09-18 DIAGNOSIS — E7800 Pure hypercholesterolemia, unspecified: Secondary | ICD-10-CM

## 2014-09-18 DIAGNOSIS — C50511 Malignant neoplasm of lower-outer quadrant of right female breast: Secondary | ICD-10-CM | POA: Diagnosis not present

## 2014-09-18 DIAGNOSIS — I10 Essential (primary) hypertension: Secondary | ICD-10-CM

## 2014-09-18 DIAGNOSIS — R42 Dizziness and giddiness: Secondary | ICD-10-CM | POA: Diagnosis not present

## 2014-09-18 LAB — CBC WITH DIFFERENTIAL/PLATELET
BASO%: 0.5 % (ref 0.0–2.0)
Basophils Absolute: 0 10*3/uL (ref 0.0–0.1)
EOS ABS: 0 10*3/uL (ref 0.0–0.5)
EOS%: 0.3 % (ref 0.0–7.0)
HCT: 37 % (ref 34.8–46.6)
HEMOGLOBIN: 12.4 g/dL (ref 11.6–15.9)
LYMPH#: 1.8 10*3/uL (ref 0.9–3.3)
LYMPH%: 26.7 % (ref 14.0–49.7)
MCH: 33.7 pg (ref 25.1–34.0)
MCHC: 33.6 g/dL (ref 31.5–36.0)
MCV: 100.2 fL (ref 79.5–101.0)
MONO#: 0.6 10*3/uL (ref 0.1–0.9)
MONO%: 8.7 % (ref 0.0–14.0)
NEUT#: 4.2 10*3/uL (ref 1.5–6.5)
NEUT%: 63.8 % (ref 38.4–76.8)
Platelets: 231 10*3/uL (ref 145–400)
RBC: 3.69 10*6/uL — ABNORMAL LOW (ref 3.70–5.45)
RDW: 13.3 % (ref 11.2–14.5)
WBC: 6.6 10*3/uL (ref 3.9–10.3)

## 2014-09-18 LAB — COMPREHENSIVE METABOLIC PANEL (CC13)
ALBUMIN: 3.6 g/dL (ref 3.5–5.0)
ALK PHOS: 49 U/L (ref 40–150)
ALT: 13 U/L (ref 0–55)
ANION GAP: 6 meq/L (ref 3–11)
AST: 19 U/L (ref 5–34)
BUN: 19.5 mg/dL (ref 7.0–26.0)
CO2: 28 mEq/L (ref 22–29)
Calcium: 8.8 mg/dL (ref 8.4–10.4)
Chloride: 101 mEq/L (ref 98–109)
Creatinine: 0.7 mg/dL (ref 0.6–1.1)
EGFR: 72 mL/min/{1.73_m2} — AB (ref >90)
Glucose: 96 mg/dl (ref 70–140)
Potassium: 4.3 mEq/L (ref 3.5–5.1)
Sodium: 135 mEq/L — ABNORMAL LOW (ref 136–145)
Total Bilirubin: 0.26 mg/dL (ref 0.20–1.20)
Total Protein: 5.9 g/dL — ABNORMAL LOW (ref 6.4–8.3)

## 2014-09-18 NOTE — Patient Instructions (Signed)
Continue your current therapy  I will see you in one year   

## 2014-09-18 NOTE — Progress Notes (Signed)
Cynthia Watkins Date of Birth: December 07, 1924   History of Present Illness: Cynthia Watkins is seen today for  followup. She has a history of hypertension, hyperlipidemia, and a right bundle branch block. She was seen by Rosaria Ferries PA at the end of June with complaints of palpitations and feeling faint. Since then she reports she is feeling very well. Her energy level is good. She remains active. She denies any chest pain or shortness of breath. She noted only one episode of feeling lightheaded after she got up from sitting a long time. Event monitor showed no arrhythmia. Echo noted below. She is on no antihypertensive therapy now.   Current Outpatient Prescriptions on File Prior to Visit  Medication Sig Dispense Refill  . Biotin 5000 MCG CAPS Take by mouth daily.    . Calcium Carb-Cholecalciferol 600-800 MG-UNIT TABS Take by mouth. Take one tablet daily    . metoprolol tartrate (LOPRESSOR) 25 MG tablet Take 1 tablet (25 mg total) by mouth 2 (two) times daily as needed. 60 tablet 3  . tamoxifen (NOLVADEX) 20 MG tablet TAKE 1 TABLET ONCE DAILY. 30 tablet PRN   No current facility-administered medications on file prior to visit.    No Known Allergies  Past Medical History  Diagnosis Date  . Hypertension   . Hypercholesterolemia     on lipitor  . Right bundle branch block   . Breast cancer 12/11/2008  . Mitral valve prolapse   . Fatigue 01/2012  . Palpitations 08/05/2000  . Senile osteoporosis 09/28/2001  . Other abnormal blood chemistry 01/21/2010  . Acute upper respiratory infections of unspecified site 06/09/2011    Past Surgical History  Procedure Laterality Date  . Tonsillectomy    . Tubal ligation  1963  . Laparoscopic lysis of adhesions  07/15/2006    [name]  . Other surgical history    . Breast lumpectomy Right 12/27/2007    History  Smoking status  . Former Smoker  . Quit date: 08/06/1954  Smokeless tobacco  . Never Used    History  Alcohol Use  . Yes    Comment:  Occasionally     Family History  Problem Relation Age of Onset  . Stroke Mother   . Cancer Father     colon  . Alzheimer's disease Sister     Review of Systems: As the history of present illness.  All other systems were reviewed and are negative.  Physical Exam: BP 102/68 mmHg  Pulse 68  Ht 5\' 1"  (1.549 m)  Wt 46.72 kg (103 lb)  BMI 19.47 kg/m2 She is a pleasant, elderly white female in no acute distress.The patient is alert and oriented x 3.    The skin is warm and dry.   The HEENT exam is normal. The carotids are 2+ without bruits.  There is no thyromegaly.  There is no JVD.  The lungs are clear.    The heart exam reveals a regular rate with a normal S1 and S2.  There are no murmurs, gallops, or rubs.  The PMI is not displaced.   Abdominal exam reveals good bowel sounds.There are no masses.  Exam of the legs reveal trace edema.  There are superficial varicosities.  The distal pulses are intact.  Cranial nerves II - XII are intact.  Motor and sensory functions are intact.  The gait is normal.  LABORATORY DATA: Lab Results  Component Value Date   WBC 6.5 07/27/2014   HGB 13.8 07/27/2014  HCT 39 07/27/2014   PLT 273 07/27/2014   GLUCOSE 86 09/22/2013   CHOL 194 07/27/2014   TRIG 90 07/27/2014   HDL 69 07/27/2014   LDLCALC 118 07/27/2014   ALT 15 07/27/2014   AST 19 07/27/2014   NA 138 07/27/2014   K 4.8 07/27/2014   CL 101 09/22/2013   CREATININE 0.8 07/27/2014   BUN 18 07/27/2014   CO2 27 09/22/2013   TSH 1.26 01/29/2012   Event monitor is normal.   Echo 07/13/14:Study Conclusions  - Left ventricle: The cavity size was normal. Wall thickness was normal. Systolic function was normal. The estimated ejection fraction was in the range of 55% to 60%. Wall motion was normal; there were no regional wall motion abnormalities. Left ventricular diastolic function parameters were normal. - Aortic valve: There was mild regurgitation. - Mitral valve: There was mild to  moderate regurgitation directed centrally. - Left atrium: The atrium was mildly dilated. - Atrial septum: No defect or patent foramen ovale was identified. - Tricuspid valve: Systolic bowing without prolapse. There was mild-moderate regurgitation directed centrally.  Assessment / Plan: 1. Symptoms of feeling faint and palpitations. Evaluation with Echo and event monitor are unrevealing. Symptoms have almost completely resolved. Discussed preventive measures of avoiding standing too quickly and contracting muscles in legs before rising. Will follow up as needed for recurrent symptoms.  2. Hypercholesterolemia, on chronic Lipitor.  3. Right bundle branch block, chronic.  4. History of HTN- now well controlled on no therapy.

## 2014-09-25 ENCOUNTER — Telehealth: Payer: Self-pay | Admitting: Oncology

## 2014-09-25 ENCOUNTER — Ambulatory Visit: Payer: Medicare Other

## 2014-09-25 ENCOUNTER — Other Ambulatory Visit: Payer: Medicare Other

## 2014-09-25 ENCOUNTER — Ambulatory Visit (HOSPITAL_BASED_OUTPATIENT_CLINIC_OR_DEPARTMENT_OTHER): Payer: Medicare Other | Admitting: Oncology

## 2014-09-25 VITALS — BP 144/71 | HR 67 | Temp 98.1°F | Resp 16 | Ht 61.0 in | Wt 105.2 lb

## 2014-09-25 DIAGNOSIS — C50511 Malignant neoplasm of lower-outer quadrant of right female breast: Secondary | ICD-10-CM

## 2014-09-25 MED ORDER — TAMOXIFEN CITRATE 20 MG PO TABS: 20.0000 mg | ORAL_TABLET | Freq: Every day | ORAL | Status: DC

## 2014-09-25 NOTE — Telephone Encounter (Signed)
Appointments made and avs printed for patient °

## 2014-09-25 NOTE — Progress Notes (Signed)
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 Preoperative MRI showed solitary mass. She had lumpectomy 12-27-2007 with 1.5 cm grade 3 invasive ductal carcinoma, no axillary nodes evaluated. She had RT to breast and axilla by Dr Cynthia Watkins. Patient was not inclined to pursue adjuvant chemotherapy or herceptin at diagnosis. She was on Femara from 03/2008 thru 11/2009 when this was changed to tamoxifen due to osteoporosis. She has tolerated the tamoxifen without difficulty. She has not had hysterectomy. Last mammograms were at [facility] 11-21-2011 with scattered fibroglandular densities and no mammographic findings of concern. She had unremarkable CXR with ED evaluation for infectious cough in Jan 2014."  The patient's subsequent history is as detailed below  INTERVAL HISTORY: [name] returns today for followup of her breast cancer. The interval history is unremarkable. She continues on tamoxifen, and the main problem she has from that is a vaginal wetness. This does not bother her much she says. Occasionally she has night sweats which wake her up at night but "I wake up any way". She also received zolendronate a year ago with no side effects that she remembers  She was supposed to have had a bone density before this visit but apparently that has been put off until November.  REVIEW OF SYSTEMS: She wakes up around 6 in the morning, walks for 30 minutes, has breakfast, doesn't exercise class V days a week, has lunch, and then reads much of the  afternoon. Currently she is reading about bandlike segments and the book by Cynthia Watkins. In the evening she meets friends for supper. She had an episode of palpitations she says and they started her on Lopressor but she is not taking that. Sometimes she does feel a rapid heartbeat and she feels like she might faint. She says she gas son everything goes right back to normal. She is followed by Cynthia Watkins for this and she had a recent Holter monitor which was reassuring. Her echo in July also showed a normal EF. A detailed review of systems today was otherwise noncontributory  PAST MEDICAL HISTORY: Past Medical History  Diagnosis Date  . Hypertension   . Hypercholesterolemia     on lipitor  . Right bundle branch block   . Breast cancer 12/11/2008  . Mitral valve prolapse   . Fatigue 01/2012  . Palpitations 08/05/2000  . Senile osteoporosis 09/28/2001  . Other abnormal blood chemistry 01/21/2010  . Acute upper respiratory infections of unspecified site 06/09/2011    PAST SURGICAL HISTORY: Past Surgical History  Procedure Laterality Date  . Tonsillectomy    . Tubal ligation  1963  . Laparoscopic lysis of adhesions  07/15/2006    Dr. Johney Watkins  . Other surgical history    . Breast lumpectomy Right 12/27/2007    FAMILY HISTORY Family History  Problem Relation Age of Onset  . Stroke Mother   . Cancer Father     colon  . Alzheimer's disease Sister    the patient's father had a history of colon cancer. 2 of his sisters had breast  cancer, but not diagnosed before age 74. The patient's mother lived to be 57. The patient's sister was diagnosed with breast cancer in her 17s.  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 Her husband, Cynthia Watkins was a pediatric surgeon and died earlier this year. Her son [name] of course is a urologist locally. The patient also has children in [place] and [place]. Her fourth child unfortunately died in a drowning accident. She has 7 grandchildren and 2 stepgrandchildren. She attends the [employer]; her son [name] is her healthcare power of attorney   HEALTH MAINTENANCE: Social History  Substance Use Topics  . Smoking status: Former Smoker    Quit date: 08/06/1954  . Smokeless tobacco: Never Used  . Alcohol Use: Yes     Comment: Occasionally      Colonoscopy:  PAP:  Bone density: November 2013/Cynthia Watkins/T - 3.3  Lipid panel:  No Known Allergies  Current Outpatient Prescriptions  Medication Sig Dispense Refill  . Biotin 5000 MCG CAPS Take by mouth daily.    . Calcium Carb-Cholecalciferol 600-800 MG-UNIT TABS Take by mouth. Take one tablet daily    . metoprolol tartrate (LOPRESSOR) 25 MG tablet Take 1 tablet (25 mg total) by mouth 2 (two) times daily as needed. 60 tablet 3  . tamoxifen (NOLVADEX) 20 MG tablet TAKE 1 TABLET ONCE DAILY. 30 tablet PRN   No current facility-administered medications for this visit.    OBJECTIVE: Elderly white woman who appears well Filed Vitals:   09/25/14 1452  BP: 144/71  Pulse: 67  Temp: 98.1 F (36.7 C)  Resp: 16     Body mass index is 19.89 kg/(m^2).    ECOG FS:0 - Asymptomatic   Sclerae unicteric, EOMs intact Oropharynx clear, dentition in good repair No cervical or supraclavicular adenopathy Lungs no rales or rhonchi Heart regular rate and rhythm today Abd soft, nontender, positive bowel sounds MSK scoliosis but no focal spinal tenderness, no upper extremity lymphedema Neuro: nonfocal, well oriented, positive affect Breasts: The right breast is status post lumpectomy and radiation. There is no evidence of local recurrence. The right axilla is benign per the left breast is unremarkable.    LAB RESULTS:  CMP     Component Value Date/Time   NA 135* 09/18/2014 1437   NA 138 07/27/2014   NA 139 09/22/2013 1401   K 4.3 09/18/2014 1437   K 4.8  07/27/2014   CL 101 09/22/2013 1401   CL 98 02/17/2012 1500   CO2 28 09/18/2014 1437   CO2 27 09/22/2013 1401   GLUCOSE 96 09/18/2014 1437   GLUCOSE 86 09/22/2013 1401   GLUCOSE 134* 02/17/2012 1500   BUN 19.5 09/18/2014 1437   BUN 18 07/27/2014   BUN 19 09/22/2013 1401   CREATININE 0.7 09/18/2014 1437   CREATININE 0.8 07/27/2014   CREATININE 0.70 09/22/2013 1401   CALCIUM 8.8 09/18/2014 1437   CALCIUM 9.2 09/22/2013 1401   PROT 5.9* 09/18/2014 1437   PROT 6.6 09/22/2013 1401   ALBUMIN 3.6 09/18/2014 1437   ALBUMIN 3.6 09/22/2013 1401   AST 19 09/18/2014 1437   AST 19 07/27/2014   ALT 13 09/18/2014 1437   ALT 15 07/27/2014   ALKPHOS 49 09/18/2014 1437   ALKPHOS 38 07/27/2014   BILITOT 0.26 09/18/2014 1437   BILITOT 0.2* 09/22/2013 1401   GFRNONAA >  60 12/23/2007 0935   GFRAA  12/23/2007 0935    >60        The eGFR has been calculated using the MDRD equation. This calculation has not been validated in all clinical    I No results found for: SPEP  Lab Results  Component Value Date   WBC 6.6 09/18/2014   NEUTROABS 4.2 09/18/2014   HGB 12.4 09/18/2014   HCT 37.0 09/18/2014   MCV 100.2 09/18/2014   PLT 231 09/18/2014      Chemistry      Component Value Date/Time   NA 135* 09/18/2014 1437   NA 138 07/27/2014   NA 139 09/22/2013 1401   K 4.3 09/18/2014 1437   K 4.8 07/27/2014   CL 101 09/22/2013 1401   CL 98 02/17/2012 1500   CO2 28 09/18/2014 1437   CO2 27 09/22/2013 1401   BUN 19.5 09/18/2014 1437   BUN 18 07/27/2014   BUN 19 09/22/2013 1401   CREATININE 0.7 09/18/2014 1437   CREATININE 0.8 07/27/2014   CREATININE 0.70 09/22/2013 1401   GLU 100 07/27/2014      Component Value Date/Time   CALCIUM 8.8 09/18/2014 1437   CALCIUM 9.2 09/22/2013 1401   ALKPHOS 49 09/18/2014 1437   ALKPHOS 38 07/27/2014   AST 19 09/18/2014 1437   AST 19 07/27/2014   ALT 13 09/18/2014 1437   ALT 15 07/27/2014   BILITOT 0.26 09/18/2014 1437   BILITOT 0.2* 09/22/2013  1401       Lab Results  Component Value Date   LABCA2 33 02/17/2012    No components found for: [id number]  No results for input(s): INR in the last 168 hours.  Urinalysis    Component Value Date/Time   COLORURINE YELLOW 12/23/2007 0945   APPEARANCEUR CLEAR 12/23/2007 0945   LABSPEC 1.018 12/23/2007 0945   PHURINE 7.0 12/23/2007 0945   GLUCOSEU NEGATIVE 12/23/2007 0945   HGBUR NEGATIVE 12/23/2007 0945   BILIRUBINUR NEGATIVE 12/23/2007 0945   KETONESUR NEGATIVE 12/23/2007 0945   PROTEINUR NEGATIVE 12/23/2007 0945   UROBILINOGEN 0.2 12/23/2007 0945   NITRITE NEGATIVE 12/23/2007 0945   LEUKOCYTESUR TRACE* 12/23/2007 0945    STUDIES: Repeat mammography and bone density this year are pending  ASSESSMENT: 81 y.o. [facility] resident status post right simple lumpectomy 12/27/2007 for a pT1c pNX, stage IA invasive ductal carcinoma, grade 3, estrogen receptor 98% positive, progesterone receptor 32% positive, with an MIB-1 of 14% and HercepTest positive at 3+  (1) declined adjuvant chemotherapy/trastuzumab  (2) completed adjuvant radiation to the right breast and axilla February 2010  (3) antiestrogen therapy: Anastrozole March 2010 through November 2011, at which point she switched to tamoxifen  (4) osteoporosis; most recent DEXA scan at [facility] 11/21/2011 showed a T score of -3.3  (a) zolendronate yearly started 09/22/2013  PLAN: Kanika had her definitive breast surgery nearly 7 years ago, with no evidence of disease recurrence. She is tolerating tamoxifen well and we are continuing it to a total of 10 years partly because of her osteopenia but more because she had a HER-2 positive tumor which did not receive Herceptin.  Accordingly she will see [place] again in one year. She is about due for a bone density later this year and I have reentered that order. It could be done most conveniently at the same time as her mammogram at [facility]. If there has not been significant improvement we  could repeat the zolendronate later this year. I will call her with results and  we can make that decision that  Otherwise Chenee knows to call for any problems that may develop before her next visit here.  Chauncey Cruel, MD   09/25/2014 3:21 PM

## 2014-11-14 ENCOUNTER — Telehealth: Payer: Self-pay

## 2014-11-14 NOTE — Telephone Encounter (Signed)
Patient called regarding her mammo and bone density test at [facility] which she scheduled.  She asked that writer send over an order.  Writer called [facility] and they will be faxing an order for Dr. Jana Hakim to sign.

## 2014-12-18 DIAGNOSIS — M810 Age-related osteoporosis without current pathological fracture: Secondary | ICD-10-CM | POA: Diagnosis not present

## 2014-12-18 DIAGNOSIS — R928 Other abnormal and inconclusive findings on diagnostic imaging of breast: Secondary | ICD-10-CM | POA: Diagnosis not present

## 2014-12-18 DIAGNOSIS — Z8262 Family history of osteoporosis: Secondary | ICD-10-CM | POA: Diagnosis not present

## 2015-01-10 ENCOUNTER — Encounter: Payer: Self-pay | Admitting: Oncology

## 2015-02-05 DIAGNOSIS — Z0100 Encounter for examination of eyes and vision without abnormal findings: Secondary | ICD-10-CM | POA: Diagnosis not present

## 2015-02-05 DIAGNOSIS — H2512 Age-related nuclear cataract, left eye: Secondary | ICD-10-CM | POA: Diagnosis not present

## 2015-02-06 DIAGNOSIS — I10 Essential (primary) hypertension: Secondary | ICD-10-CM | POA: Diagnosis not present

## 2015-02-06 DIAGNOSIS — R5383 Other fatigue: Secondary | ICD-10-CM | POA: Diagnosis not present

## 2015-02-06 LAB — HEPATIC FUNCTION PANEL
ALK PHOS: 40 U/L (ref 25–125)
ALT: 13 U/L (ref 7–35)
AST: 21 U/L (ref 13–35)
BILIRUBIN, TOTAL: 0.5 mg/dL

## 2015-02-06 LAB — BASIC METABOLIC PANEL
BUN: 10 mg/dL (ref 4–21)
Creatinine: 0.8 mg/dL (ref 0.5–1.1)
Glucose: 91 mg/dL
Potassium: 4.5 mmol/L (ref 3.4–5.3)
Sodium: 135 mmol/L — AB (ref 137–147)

## 2015-02-06 LAB — CBC AND DIFFERENTIAL
HEMATOCRIT: 39 % (ref 36–46)
HEMOGLOBIN: 13.1 g/dL (ref 12.0–16.0)
PLATELETS: 293 10*3/uL (ref 150–399)
WBC: 5.9 10^3/mL

## 2015-02-07 ENCOUNTER — Other Ambulatory Visit: Payer: Self-pay

## 2015-02-14 ENCOUNTER — Non-Acute Institutional Stay: Payer: Medicare Other | Admitting: Internal Medicine

## 2015-02-14 ENCOUNTER — Encounter: Payer: Self-pay | Admitting: Internal Medicine

## 2015-02-14 VITALS — BP 120/80 | HR 77 | Temp 98.1°F | Resp 20 | Ht 61.0 in | Wt 104.4 lb

## 2015-02-14 DIAGNOSIS — I10 Essential (primary) hypertension: Secondary | ICD-10-CM | POA: Diagnosis not present

## 2015-02-14 DIAGNOSIS — R739 Hyperglycemia, unspecified: Secondary | ICD-10-CM | POA: Diagnosis not present

## 2015-02-14 DIAGNOSIS — M810 Age-related osteoporosis without current pathological fracture: Secondary | ICD-10-CM | POA: Diagnosis not present

## 2015-02-14 DIAGNOSIS — K5902 Outlet dysfunction constipation: Secondary | ICD-10-CM | POA: Diagnosis not present

## 2015-02-14 DIAGNOSIS — C50511 Malignant neoplasm of lower-outer quadrant of right female breast: Secondary | ICD-10-CM | POA: Diagnosis not present

## 2015-02-14 DIAGNOSIS — E7800 Pure hypercholesterolemia, unspecified: Secondary | ICD-10-CM | POA: Diagnosis not present

## 2015-02-14 NOTE — Progress Notes (Signed)
Patient ID: Cynthia Watkins, female   DOB: Apr 27, 1924, 82 y.o.   MRN: [id number]   Location: [facility] Provider: Samira Acero L. Mariea Clonts, D.O., C.M.D.  Code Status: DNR Goals of Care: Advanced Directive information Does patient have an advance directive?: Yes, Type of Advance Directive: [place], Does patient want to make changes to advanced directive?: No - Patient declined  Chief Complaint  Patient presents with  . Medical Management of Chronic Issues    pulse racing  and bp elevated.    HPI: Patient is a 82 y.o. female seen in the office today for med mgt of chronic diseases.  Reports she's had her pulse racing and blood pressure elevated 2-3 times in a the past few months.  Goes over 150 about 2x per month and takes valsartan then.  One episode happened at 11pm where bp went up to 169/81 on right, HR 69, 174/89 left, HR 70; felt dizzy at the time.   Took the valsartan and went back to normal.  Heart monitor for one month didn't show explanation before.    No other concerns that are new.  Has her chronic difficulty with BMs.  Eats lentils, prunes, but still has difficulty going--feels like her opening is small.  Thinks a stool softener might help her.  Has not tried one before.    Also takes blueberries and almonds.  May have helped her bp.  Breast cancer history:  Dr. Jana Hakim has recommended she continue on the tamoxifen indefinitely.  Only gets a little vaginal discharge from it.  No achiness or other side effects that she is aware of.    Osteoporosis:  2 years ago she had reclast through Dr. Jana Hakim.  Discussed how bone density of lumbar spine has continued to go down.  Asks if I recommend the reclast--yes.  She is going to call Dr. Jana Hakim since he arranged the previous infusion.  Feels like she has something in her right leg that almost gives way when walking.  It sticks, but does not actually give way.  Comes and goes away right away.  No chronic pain.   Continues to do yoga and a strength and stretching class, as well.  Walks a lot.    Review of Systems:  Review of Systems  Constitutional: Negative for fever, chills and malaise/fatigue.  HENT: Negative for congestion and hearing loss.   Eyes: Negative for blurred vision.       Glasses  Respiratory: Negative for shortness of breath.   Cardiovascular: Negative for chest pain and leg swelling.  Gastrointestinal: Negative for abdominal pain, blood in stool and melena.       Reports small rectal vault/anus so hard to have BMs  Genitourinary: Negative for dysuria.  Musculoskeletal: Negative for falls.       Occasional sticking in right hip  Skin: Negative for rash.  Neurological: Negative for dizziness, loss of consciousness and weakness.  Psychiatric/Behavioral: Negative for depression and memory loss.    Past Medical History  Diagnosis Date  . Hypertension   . Hypercholesterolemia     on lipitor  . Right bundle branch block   . Breast cancer ([place]) 12/11/2008  . Mitral valve prolapse   . Fatigue 01/2012  . Palpitations 08/05/2000  . Senile osteoporosis 09/28/2001  . Other abnormal blood chemistry 01/21/2010  . Acute upper respiratory infections of unspecified site 06/09/2011    Past Surgical History  Procedure Laterality Date  . Tonsillectomy    . Tubal ligation  1963  .  Laparoscopic lysis of adhesions  07/15/2006    [name]  . Other surgical history    . Breast lumpectomy Right 12/27/2007    No Known Allergies    Medication List       This list is accurate as of: 02/14/15  2:18 PM.  Always use your most recent med list.               Biotin 5000 MCG Caps  Take by mouth daily.     Calcium Carb-Cholecalciferol 600-800 MG-UNIT Tabs  Take by mouth. Take one tablet daily     cholecalciferol 1000 units tablet  Commonly known as:  VITAMIN D  Take 1 tablet (1,000 Units total) by mouth daily.     tamoxifen 20 MG tablet  Commonly known as:  NOLVADEX  Take 1 tablet (20  mg total) by mouth daily.     valsartan 40 MG tablet  Commonly known as:  DIOVAN  Take 40 mg by mouth daily.        Health Maintenance  Topic Date Due  . TETANUS/TDAP  05/[birth year]/1945  . INFLUENZA VACCINE  08/07/2015  . MAMMOGRAM  12/18/2015  . DEXA SCAN  Completed  . ZOSTAVAX  Completed  . PNA vac Low Risk Adult  Completed    Physical Exam: Filed Vitals:   02/14/15 1404  BP: 120/80  Pulse: 77  Temp: 98.1 F (36.7 C)  TempSrc: Oral  Resp: 20  Height: 5\' 1"  (1.549 m)  Weight: 104 lb 6.4 oz (47.356 kg)  SpO2: 94%   Body mass index is 19.74 kg/(m^2). Physical Exam  Constitutional: She is oriented to person, place, and time. She appears well-developed and well-nourished. No distress.  Cardiovascular: Normal rate, regular rhythm, normal heart sounds and intact distal pulses.   Pulmonary/Chest: Effort normal and breath sounds normal. No respiratory distress.  Abdominal: Bowel sounds are normal.  Musculoskeletal: Normal range of motion.  Steadily walks w/o assistive device  Neurological: She is alert and oriented to person, place, and time.  Skin: Skin is warm and dry.  Psychiatric: She has a normal mood and affect.    Labs reviewed: Basic Metabolic Panel:  Recent Labs  07/27/14 09/18/14 1437 02/06/15  NA 138 135* 135*  K 4.8 4.3 4.5  CO2  --  28  --   GLUCOSE  --  96  --   BUN 18 19.5 10  CREATININE 0.8 0.7 0.8  CALCIUM  --  8.8  --    Liver Function Tests:  Recent Labs  07/27/14 09/18/14 1437 02/06/15  AST 19 19 21   ALT 15 13 13   ALKPHOS 38 49 40  BILITOT  --  0.26  --   PROT  --  5.9*  --   ALBUMIN  --  3.6  --    No results for input(s): LIPASE, AMYLASE in the last 8760 hours. No results for input(s): AMMONIA in the last 8760 hours. CBC:  Recent Labs  07/27/14 09/18/14 1436 02/06/15  WBC 6.5 6.6 5.9  NEUTROABS  --  4.2  --   HGB 13.8 12.4 13.1  HCT 39 37.0 39  MCV  --  100.2  --   PLT 273 231 293   Lipid Panel:  Recent Labs  07/27/14    CHOL 194  HDL 69  LDLCALC 118  TRIG 90    Procedures since last visit: Reviewed 12/16 bone density and mammogram from [facility]  Assessment/Plan 1. Essential hypertension, benign -bp normally at goal except  2 times in the past two months when elevated at hs and associated with palpitations, but her pulse is noted in her record as normal at those times, takes her valsartan as needed when >150  2. Breast cancer of lower-outer quadrant of right female breast [facility]) -is on tamoxifen therapy indefinitely -follows with Dr. Jana Hakim -last mammogram in 12/16 ok  3. Hypercholesterolemia -not on meds, eats healthy, balanced diet and exercises, recheck flp before annual exam   4. Hyperglycemia -continues to do well with diet and exercise, recheck hba1c before annual exam  5. Senile osteoporosis -cont ca with D with additional D daily  -arrange to get reclast infusion through Dr. Jana Hakim as previously done  6.  Constipation due to outlet dysfunction -recommended she try one senna s tablet daily for this to soften stools so it's easier to pass a BM  Labs/tests ordered:  Cbc, cmp, flp, hba1c in 6 mos Next appt:  6 mos annual wellness, labs before  Arlee Santosuosso L. Wilbon Obenchain, D.O. [facility] [address], [place] [address] Cell Phone (Mon-Fri 8am-5pm):  [phone] On Call:  [phone] & follow prompts after 5pm & weekends Office Phone:  [phone] Office Fax:  [phone]

## 2015-02-14 NOTE — Patient Instructions (Signed)
Try senna-s for your bowels.

## 2015-02-15 DIAGNOSIS — H25812 Combined forms of age-related cataract, left eye: Secondary | ICD-10-CM | POA: Diagnosis not present

## 2015-02-15 DIAGNOSIS — H2512 Age-related nuclear cataract, left eye: Secondary | ICD-10-CM | POA: Diagnosis not present

## 2015-02-16 ENCOUNTER — Telehealth: Payer: Self-pay

## 2015-02-16 NOTE — Telephone Encounter (Signed)
Pt reports bone density complete and is requesting zometa infusion.  Routed to bc pod 2

## 2015-02-19 ENCOUNTER — Other Ambulatory Visit: Payer: Self-pay | Admitting: Oncology

## 2015-02-19 ENCOUNTER — Telehealth: Payer: Self-pay | Admitting: Oncology

## 2015-02-19 NOTE — Telephone Encounter (Signed)
Added zometa to 9/25 f/u per 2/13 pof. Left message for patient and also confirmed both 9/18 and 9/25 appointments. Schedule mailed.

## 2015-02-22 ENCOUNTER — Telehealth: Payer: Self-pay

## 2015-02-22 NOTE — Telephone Encounter (Signed)
Writer called patient and LVM to let her know that she is scheduled for labs, Dr. Jana Hakim appt and reclast in September.  Patient encouraged to call with any questions.

## 2015-03-16 DIAGNOSIS — Z961 Presence of intraocular lens: Secondary | ICD-10-CM | POA: Diagnosis not present

## 2015-03-16 DIAGNOSIS — Z0100 Encounter for examination of eyes and vision without abnormal findings: Secondary | ICD-10-CM | POA: Diagnosis not present

## 2015-03-27 DIAGNOSIS — Z961 Presence of intraocular lens: Secondary | ICD-10-CM | POA: Diagnosis not present

## 2015-04-23 DIAGNOSIS — H2000 Unspecified acute and subacute iridocyclitis: Secondary | ICD-10-CM | POA: Diagnosis not present

## 2015-06-07 ENCOUNTER — Encounter: Payer: Medicare Other | Admitting: Internal Medicine

## 2015-07-02 ENCOUNTER — Encounter: Payer: Self-pay | Admitting: Cardiology

## 2015-07-02 ENCOUNTER — Encounter (INDEPENDENT_AMBULATORY_CARE_PROVIDER_SITE_OTHER): Payer: Medicare Other

## 2015-07-02 ENCOUNTER — Ambulatory Visit (INDEPENDENT_AMBULATORY_CARE_PROVIDER_SITE_OTHER): Payer: Medicare Other | Admitting: Cardiology

## 2015-07-02 ENCOUNTER — Telehealth: Payer: Self-pay | Admitting: Cardiology

## 2015-07-02 VITALS — BP 145/77 | HR 70 | Ht 61.0 in | Wt 105.8 lb

## 2015-07-02 DIAGNOSIS — R002 Palpitations: Secondary | ICD-10-CM | POA: Diagnosis not present

## 2015-07-02 DIAGNOSIS — I4510 Unspecified right bundle-branch block: Secondary | ICD-10-CM

## 2015-07-02 DIAGNOSIS — R42 Dizziness and giddiness: Secondary | ICD-10-CM | POA: Diagnosis not present

## 2015-07-02 NOTE — Telephone Encounter (Signed)
Returned call to patient.She stated she would like to see Dr.Jordan.Stated last Thursday she had a delay in heart beat.B/P has been low 105/63,94/59 pulse 72.Stated she has no energy.Appointment scheduled with Dr.Jordan this afternoon at 2:45 pm.

## 2015-07-02 NOTE — Telephone Encounter (Signed)
Pt calling re BP being very low-today 105/63 yesterday 94/59 -irregular heartbeat off and on-pls advise [phone]

## 2015-07-02 NOTE — Patient Instructions (Signed)
We will have you wear an event monitor

## 2015-07-03 NOTE — Progress Notes (Signed)
Cynthia Watkins Date of Birth: December 30, 1924   History of Present Illness: Cynthia Watkins is seen as a work in for palpitations. She has a history of hypertension, hyperlipidemia, and a right bundle branch block. She was seen in June 2016 with complaints of palpitations and feeling faint.  Event monitor showed no arrhythmia. Echo noted below. All antihypertensive therapy was discontinued. Recently she noted a sensation that her heart stops and she didn't think it would resume. She gasps and it comes back she notes some lightheadedness. This happens about every 2 months. No syncope. No chest pain or dyspnea.   Current Outpatient Prescriptions on File Prior to Visit  Medication Sig Dispense Refill  . Biotin 5000 MCG CAPS Take by mouth daily.    . Calcium Carb-Cholecalciferol 600-800 MG-UNIT TABS Take by mouth. Take one tablet daily    . cholecalciferol (VITAMIN D) 1000 UNITS tablet Take 1 tablet (1,000 Units total) by mouth daily.    . tamoxifen (NOLVADEX) 20 MG tablet Take 1 tablet (20 mg total) by mouth daily. 90 tablet PRN  . valsartan (DIOVAN) 40 MG tablet Take 40 mg by mouth as needed.      No current facility-administered medications on file prior to visit.    No Known Allergies  Past Medical History  Diagnosis Date  . Hypertension   . Hypercholesterolemia     on lipitor  . Right bundle branch block   . Breast cancer ([place]) 12/11/2008  . Mitral valve prolapse   . Fatigue 01/2012  . Palpitations 08/05/2000  . Senile osteoporosis 09/28/2001  . Other abnormal blood chemistry 01/21/2010  . Acute upper respiratory infections of unspecified site 06/09/2011    Past Surgical History  Procedure Laterality Date  . Tonsillectomy    . Tubal ligation  1963  . Laparoscopic lysis of adhesions  07/15/2006    [name]  . Other surgical history    . Breast lumpectomy Right 12/27/2007    History  Smoking status  . Former Smoker  . Quit date: 08/06/1954  Smokeless tobacco  . Never Used     History  Alcohol Use  . Yes    Comment: Occasionally     Family History  Problem Relation Age of Onset  . Stroke Mother   . Cancer Father     colon  . Alzheimer's disease Sister     Review of Systems: As the history of present illness.  All other systems were reviewed and are negative.  Physical Exam: BP 145/77 mmHg  Pulse 70  Ht 5\' 1"  (1.549 m)  Wt 105 lb 12.8 oz (47.991 kg)  BMI 20.00 kg/m2 She is a pleasant, elderly white female in no acute distress.The patient is alert and oriented x 3.    The skin is warm and dry.   The HEENT exam is normal. The carotids are 2+ without bruits.  There is no thyromegaly.  There is no JVD.  The lungs are clear.    The heart exam reveals a regular rate with a normal S1 and S2.  There are no murmurs, gallops, or rubs.  The PMI is not displaced.   Abdominal exam reveals good bowel sounds.There are no masses.  Exam of the legs reveal trace edema.  There are superficial varicosities.  The distal pulses are intact.  Cranial nerves II - XII are intact.  Motor and sensory functions are intact.  The gait is normal.  LABORATORY DATA: Lab Results  Component Value Date   WBC  5.9 02/06/2015   HGB 13.1 02/06/2015   HCT 39 02/06/2015   PLT 293 02/06/2015   GLUCOSE 96 09/18/2014   CHOL 194 07/27/2014   TRIG 90 07/27/2014   HDL 69 07/27/2014   LDLCALC 118 07/27/2014   ALT 13 02/06/2015   AST 21 02/06/2015   NA 135* 02/06/2015   K 4.5 02/06/2015   CL 101 09/22/2013   CREATININE 0.8 02/06/2015   BUN 10 02/06/2015   CO2 28 09/18/2014   TSH 1.26 01/29/2012   Echo 07/13/14:Study Conclusions  - Left ventricle: The cavity size was normal. Wall thickness was normal. Systolic function was normal. The estimated ejection fraction was in the range of 55% to 60%. Wall motion was normal; there were no regional wall motion abnormalities. Left ventricular diastolic function parameters were normal. - Aortic valve: There was mild regurgitation. -  Mitral valve: There was mild to moderate regurgitation directed centrally. - Left atrium: The atrium was mildly dilated. - Atrial septum: No defect or patent foramen ovale was identified. - Tricuspid valve: Systolic bowing without prolapse. There was mild-moderate regurgitation directed centrally.  Assessment / Plan: 1. Symptoms of feeling faint and palpitations. Evaluation with Echo and event monitor last year were unrevealing. Symptoms are infrequent but very concerning to her. I have recommended repeating a 30 day event monitor. I don't think an ILR is necessary at this point.   2. Hypercholesterolemia, on chronic Lipitor.  3. Right bundle branch block, chronic.  4. History of HTN- now well controlled on no therapy.

## 2015-07-06 ENCOUNTER — Telehealth: Payer: Self-pay | Admitting: Cardiology

## 2015-07-06 NOTE — Telephone Encounter (Signed)
Spoke to patient this morning.Stated her monitor started beeping last night so she removed it.Advised ok to put monitor back on this morning.Stated her skin is irritated by the 2 electrodes.Advised she can try rubbing calamine lotion on skin where electrodes are to be placed,let lotion dry then place electrodes on top of dried calamine lotion.Advised to call back if needed.

## 2015-07-06 NOTE — Telephone Encounter (Signed)
Pt calling re heart monitor that was put on yesterday-last night she couldn't stop it from beeping so she took it off-wants to come in today and have someone put it back on -pls call [phone]

## 2015-07-16 ENCOUNTER — Telehealth: Payer: Self-pay

## 2015-07-16 MED ORDER — ATENOLOL 25 MG PO TABS: 25.0000 mg | ORAL_TABLET | Freq: Every day | ORAL | Status: DC

## 2015-07-16 NOTE — Telephone Encounter (Signed)
Patient called Dr.Jordan received monitor strip from 07/15/15 at 11:32 am which revealed NSVT rate 162.He advised to start Atenolol 25 mg daily.Prescription sent to [facility] requested to be delivered.

## 2015-07-23 ENCOUNTER — Telehealth: Payer: Self-pay | Admitting: Cardiology

## 2015-07-23 NOTE — Addendum Note (Signed)
Addended by: Kathyrn Lass on: 07/23/2015 03:01 PM   Modules accepted: Orders, Medications

## 2015-07-23 NOTE — Telephone Encounter (Signed)
Yes let's try atenolol 12.5 mg daily  Juletta Berhe Martinique MD, [facility]

## 2015-07-23 NOTE — Telephone Encounter (Signed)
New message      Pt c/o medication issue:  1. Name of Medication: atenolol 2. How are you currently taking this medication (dosage and times per day)? 25mg  daily 3. Are you having a reaction (difficulty breathing--STAT)?  4. What is your medication issue?  Pt states since starting the medication her bp dropped to 81/48.  She says this happened after walking.  This am 136/76 , HR 51.  Pt states she does not feel good.  Could it be an adverse reaction to the medication?

## 2015-07-23 NOTE — Telephone Encounter (Signed)
Returned call to patient.Dr.Jordan advised ok to take Atenolol 12.5 mg daily.Advised to continue to monitor B/P and call back if needed.

## 2015-07-23 NOTE — Telephone Encounter (Signed)
Returned call to patient.She stated every morning before she eats or takes any medications she walks for 30 mins.Stated after she walked yesterday she felt awful.B/P 81/48 pulse in 50's.Stated she started taking Atenolol 25 mg every night on 07/19/15.Stated she has had no energy since started Atenolol.B/P this morning better 116/63 pulse 59.Stated she wants to know if she can decrease Atenolol to 12.5 mg daily.Message sent to [place].

## 2015-08-09 DIAGNOSIS — I10 Essential (primary) hypertension: Secondary | ICD-10-CM | POA: Diagnosis not present

## 2015-08-09 DIAGNOSIS — E7800 Pure hypercholesterolemia, unspecified: Secondary | ICD-10-CM | POA: Diagnosis not present

## 2015-08-09 DIAGNOSIS — I4510 Unspecified right bundle-branch block: Secondary | ICD-10-CM | POA: Diagnosis not present

## 2015-08-09 DIAGNOSIS — R739 Hyperglycemia, unspecified: Secondary | ICD-10-CM | POA: Diagnosis not present

## 2015-08-09 LAB — HEPATIC FUNCTION PANEL
ALT: 14 U/L (ref 7–35)
AST: 19 U/L (ref 13–35)
Alkaline Phosphatase: 41 U/L (ref 25–125)
Bilirubin, Total: 0.5 mg/dL

## 2015-08-09 LAB — BASIC METABOLIC PANEL
BUN: 13 mg/dL (ref 4–21)
Creatinine: 0.8 mg/dL (ref 0.5–1.1)
Glucose: 103 mg/dL
Potassium: 4.8 mmol/L (ref 3.4–5.3)
Sodium: 131 mmol/L — AB (ref 137–147)

## 2015-08-09 LAB — LIPID PANEL
Cholesterol: 185 mg/dL (ref 0–200)
HDL: 76 mg/dL — AB (ref 35–70)
LDL Cholesterol: 94 mg/dL
Triglycerides: 74 mg/dL (ref 40–160)

## 2015-08-09 LAB — CBC AND DIFFERENTIAL
HCT: 39 % (ref 36–46)
Hemoglobin: 13.3 g/dL (ref 12.0–16.0)
Platelets: 251 10*3/uL (ref 150–399)
WBC: 6.2 10^3/mL

## 2015-08-10 ENCOUNTER — Encounter: Payer: Self-pay | Admitting: Internal Medicine

## 2015-08-10 ENCOUNTER — Telehealth: Payer: Self-pay | Admitting: *Deleted

## 2015-08-10 NOTE — Telephone Encounter (Signed)
Spoke with patient and advised results from lab, per Dr. Mariea Clonts " her sodium is low, has anyone else changed her meds?"  Per patient she is not on Atenlol 25 mg, take 1/2 tablet by mouth once daily, would that have anything to do with her low sodium? Please advise

## 2015-08-10 NOTE — Telephone Encounter (Signed)
Has she been feeling ok? It's only mildly low.    Atenolol does not affect sodium levels that I am aware of.  I suspect her sodium is low due to either some degree of dehydration or excessive water intake.  Has she changed how much fluid she is drinking? Her other medications do not list low sodium as side effects.

## 2015-08-10 NOTE — Telephone Encounter (Signed)
Spoke with patient and she has been trying to increase her water intake, she has been feeling fine and no changes. Pt will discuss more at [place].

## 2015-08-15 ENCOUNTER — Encounter: Payer: Self-pay | Admitting: Internal Medicine

## 2015-08-15 ENCOUNTER — Non-Acute Institutional Stay: Payer: Medicare Other | Admitting: Internal Medicine

## 2015-08-15 VITALS — BP 112/68 | HR 60 | Temp 98.8°F | Ht 61.0 in | Wt 104.0 lb

## 2015-08-15 DIAGNOSIS — E7800 Pure hypercholesterolemia, unspecified: Secondary | ICD-10-CM

## 2015-08-15 DIAGNOSIS — E871 Hypo-osmolality and hyponatremia: Secondary | ICD-10-CM

## 2015-08-15 DIAGNOSIS — I472 Ventricular tachycardia: Secondary | ICD-10-CM

## 2015-08-15 DIAGNOSIS — I10 Essential (primary) hypertension: Secondary | ICD-10-CM | POA: Diagnosis not present

## 2015-08-15 DIAGNOSIS — H6121 Impacted cerumen, right ear: Secondary | ICD-10-CM

## 2015-08-15 DIAGNOSIS — M810 Age-related osteoporosis without current pathological fracture: Secondary | ICD-10-CM

## 2015-08-15 DIAGNOSIS — Z0000 Encounter for general adult medical examination without abnormal findings: Secondary | ICD-10-CM | POA: Diagnosis not present

## 2015-08-15 DIAGNOSIS — I4729 Other ventricular tachycardia: Secondary | ICD-10-CM

## 2015-08-15 NOTE — Progress Notes (Signed)
Location:   [place] of Service:  Clinic (12) Provider: Jenny Lai L. Mariea Clonts, D.O., C.M.D.  Patient Care Team: Gayland Curry, DO as PCP - General (Geriatric Medicine) Prentiss Bells, MD as Consulting Physician (Ophthalmology) Peter M Martinique, MD as Consulting Physician (Cardiology) Allyn Kenner, MD as Consulting Physician (Dermatology) Michael Boston, MD as Consulting Physician (General Surgery) [facility] Amada Kingfisher, MD (Inactive) as Consulting Physician (Hematology and Oncology) Chauncey Cruel, MD as Consulting Physician (Oncology) [name], MD as Consulting Physician (Ophthalmology)  Extended Emergency Contact Information Primary Emergency Contact: Gertha Calkin of [place] Phone: [phone] Work Phone: [phone] Mobile Phone: [phone] Relation: Son Secondary Emergency Contact: Joesphine Bare of [place] Phone: [phone] Mobile Phone: [phone] Relation: Son  Code Status: DNR Goals of Care: Advanced Directive information Advanced Directives 08/15/2015  Does patient have an advance directive? Yes  Type of Advance Directive [place]  Does patient want to make changes to advanced directive? -  Copy of advanced directive(s) in chart? Yes     Chief Complaint  Patient presents with  . Annual Exam    wellness visit  . MMSE    29/30 passed clock    HPI: Patient is a 82 y.o. female seen in today for an annual wellness exam.    Update:  She saw Dr. Martinique 6/26 for palpitations--was feeling like her heart was stopping and then restarting after gasping for breath.  She was also lightheaded and told them it was happening every 2 mos.  She had had an echo and event monitor the previous year.  He recommended a 30 day event monitor for her.  She was continued on lipitor and her chronic RBBB was noted.    The first night she wore it, she had nonsustained VT at 162 and atenolol  was prescribed 25mg .  bp dropped to 81/48.   He reduced it to 1/2 which is much better.    She had her labs drawn for her visit with me, and her Na was mildly low at 131. Tries not to eat a lot of sodium.  Is hydrating very well.     Depression screen [facility] 2/9 08/15/2015 02/14/2014  Decreased Interest 0 0  Down, Depressed, Hopeless 0 0  PHQ - 2 Score 0 0    Fall Risk  08/15/2015 02/14/2015 02/14/2014  Falls in the past year? No No No   MMSE - Mini Mental State Exam 08/15/2015 02/14/2014  Orientation to time 4 5  Orientation to Place 5 5  Registration 3 3  Attention/ Calculation 5 5  Recall 3 3  Language- name 2 objects 2 2  Language- repeat 1 1  Language- follow 3 step command 3 3  Language- read & follow direction 1 1  Write a sentence 1 1  Copy design 1 1  Total score 29 30    Health Maintenance  Topic Date Due  . TETANUS/TDAP  05/16/1943  . INFLUENZA VACCINE  08/07/2015  . MAMMOGRAM  12/18/2015  . DEXA SCAN  Completed  . ZOSTAVAX  Completed  . PNA vac Low Risk Adult  Completed    Functional Status Survey:        Diet? Vision Screening Comments: Cataract surgery Dr. Ellie Lunch Hearing: Dentition: Pain:  Past Medical History:  Diagnosis Date  . Acute upper respiratory infections of unspecified site 06/09/2011  . Breast cancer ([place]) 12/11/2008  . Fatigue 01/2012  . Hypercholesterolemia  on lipitor  . Hypertension   . Mitral valve prolapse   . Other abnormal blood chemistry 01/21/2010  . Palpitations 08/05/2000  . Right bundle branch block   . Senile osteoporosis 09/28/2001    Past Surgical History:  Procedure Laterality Date  . BREAST LUMPECTOMY Right 12/27/2007  . LAPAROSCOPIC LYSIS OF ADHESIONS  07/15/2006   [name]  . OTHER SURGICAL HISTORY    . TONSILLECTOMY    . TUBAL LIGATION  1963    Family History  Problem Relation Age of Onset  . Stroke Mother   . Cancer Father     colon  . Alzheimer's disease Sister     Social History   Social History  . Marital  status: Married    Spouse name: N/A  . Number of children: 3  . Years of education: N/A   Occupational History  . Retired Pharmacist, hospital Retired   Social History Main Topics  . Smoking status: Former Smoker    Quit date: 08/06/1954  . Smokeless tobacco: Never Used  . Alcohol use Yes     Comment: Occasionally   . Drug use: No  . Sexual activity: No   Other Topics Concern  . None   Social History Narrative   Lives at [place] since 05/1998   Widowed   Living will   Former smoker, stopped 1956   Alcohol  Rare   Exercise - walk one hour daily, 3 days of stretching and strengthen          reports that she quit smoking about 61 years ago. She has never used smokeless tobacco. She reports that she drinks alcohol. She reports that she does not use drugs.    Medication List       Accurate as of 08/15/15  2:55 PM. Always use your most recent med list.          atenolol 25 MG tablet Commonly known as:  TENORMIN Take 1/2 tablet daily ( 12.5 mg )   Biotin 5000 MCG Caps Take by mouth daily.   Calcium Carb-Cholecalciferol 600-800 MG-UNIT Tabs Take by mouth. Take one tablet daily   cholecalciferol 1000 units tablet Commonly known as:  VITAMIN D Take 1 tablet (1,000 Units total) by mouth daily.   tamoxifen 20 MG tablet Commonly known as:  NOLVADEX Take 1 tablet (20 mg total) by mouth daily.   valsartan 40 MG tablet Commonly known as:  DIOVAN Take 40 mg by mouth as needed.       Review of Systems:  Review of Systems  Constitutional: Negative for chills, fever and malaise/fatigue.  HENT: Positive for hearing loss.   Eyes: Positive for blurred vision.  Respiratory: Negative for shortness of breath.   Cardiovascular: Negative for chest pain, palpitations, orthopnea, claudication, leg swelling and PND.       No further palpitations recently  Gastrointestinal: Negative for abdominal pain, blood in stool, constipation and melena.  Genitourinary: Positive for frequency.  Negative for dysuria and urgency.  Musculoskeletal: Negative for back pain, falls, joint pain and myalgias.       Bunions painful  Skin: Negative for itching and rash.  Neurological: Negative for dizziness, loss of consciousness and weakness.  Endo/Heme/Allergies: Does not bruise/bleed easily.  Psychiatric/Behavioral: Negative for depression and memory loss.   Bunion bothersome at times, but manageable  Physical Exam: Vitals:   08/15/15 1440  BP: 112/68  Pulse: 60  Temp: 98.8 F (37.1 C)  TempSrc: Oral  SpO2: 97%  Weight: 104 lb (  47.2 kg)  Height: 5\' 1"  (1.549 m)   Body mass index is 19.65 kg/m. Physical Exam  Constitutional: She is oriented to person, place, and time. She appears well-developed and well-nourished. No distress.  HENT:  Head: Normocephalic and atraumatic.  Right Ear: External ear normal.  Left Ear: External ear normal.  Nose: Nose normal.  Mouth/Throat: Oropharynx is clear and moist. No oropharyngeal exudate.  Right ear with cerumen impaction; small piece of cerumen in left ear also  Eyes: Conjunctivae and EOM are normal. Pupils are equal, round, and reactive to light.  Neck: Normal range of motion. Neck supple. No JVD present. No tracheal deviation present. No thyromegaly present.  Cardiovascular: Normal rate, regular rhythm and intact distal pulses.   Murmur heard. Pulmonary/Chest: Effort normal and breath sounds normal. No respiratory distress. Right breast exhibits no inverted nipple, no mass, no nipple discharge, no skin change and no tenderness. Left breast exhibits no inverted nipple, no mass, no nipple discharge, no skin change and no tenderness.  Abdominal: Soft. Bowel sounds are normal. She exhibits no distension. There is no tenderness.  Musculoskeletal: Normal range of motion.  Lymphadenopathy:    She has no cervical adenopathy.  Neurological: She is alert and oriented to person, place, and time. She has normal reflexes. No cranial nerve deficit.    Skin: Skin is warm and dry. Capillary refill takes less than 2 seconds.  Psychiatric: She has a normal mood and affect. Her behavior is normal. Judgment and thought content normal.    Labs reviewed: Basic Metabolic Panel:  Recent Labs  09/18/14 1437 02/06/15 08/09/15 0800  NA 135* 135* 131*  K 4.3 4.5 4.8  CO2 28  --   --   GLUCOSE 96  --   --   BUN 19.5 10 13   CREATININE 0.7 0.8 0.8  CALCIUM 8.8  --   --    Liver Function Tests:  Recent Labs  09/18/14 1437 02/06/15 08/09/15 0800  AST 19 21 19   ALT 13 13 14   ALKPHOS 49 40 41  BILITOT 0.26  --   --   PROT 5.9*  --   --   ALBUMIN 3.6  --   --    No results for input(s): LIPASE, AMYLASE in the last 8760 hours. No results for input(s): AMMONIA in the last 8760 hours. CBC:  Recent Labs  09/18/14 1436 02/06/15 08/09/15 0800  WBC 6.6 5.9 6.2  NEUTROABS 4.2  --   --   HGB 12.4 13.1 13.3  HCT 37.0 39 39  MCV 100.2  --   --   PLT 231 293 251   Lipid Panel:  Recent Labs  08/09/15 0800  CHOL 185  HDL 76*  LDLCALC 94  TRIG 74   No results found for: HGBA1C  Procedures: 30  Day holter monitor done by Dr. Martinique with NSVT  Assessment/Plan 1. Hyponatremia -131 on routine labs -she does not feel differently--discussed potential symptoms she night have -will recheck next week--I suspect she may be overdoing her drinking of water that I advised on before  2. Paroxysmal VT ([place]) -had a spell while at church according to the monitor she wore -she was initially started on 25mg  atenolol but bp dropped so reduced to 12.5mg  and she is tolerating that well  3. Cerumen impaction, right -irrigated by CMA today  4. Senile osteoporosis -cont ca with D and additional D -regular exercise  5. Essential hypertension, benign -bp at goal, not too low today, no symptoms  of lightheadedness  6. Hypercholesterolemia -lipids satisfactory with statin therapy  Labs/tests ordered:  Bmp in 1 wk Next appt:  6 mos med  mgt  Jeweline Reif L. Yanelis Osika, D.O. [facility] [address], [place] [address] Cell Phone (Mon-Fri 8am-5pm):  [phone] On Call:  [phone] & follow prompts after 5pm & weekends Office Phone:  [phone] Office Fax:  [phone]

## 2015-08-21 ENCOUNTER — Encounter: Payer: Self-pay | Admitting: Internal Medicine

## 2015-08-21 DIAGNOSIS — E871 Hypo-osmolality and hyponatremia: Secondary | ICD-10-CM | POA: Diagnosis not present

## 2015-08-21 LAB — BASIC METABOLIC PANEL
BUN: 15 mg/dL (ref 4–21)
Creatinine: 0.8 mg/dL (ref 0.5–1.1)
Glucose: 97 mg/dL
Potassium: 4.7 mmol/L (ref 3.4–5.3)
Sodium: 135 mmol/L — AB (ref 137–147)

## 2015-09-05 ENCOUNTER — Ambulatory Visit (INDEPENDENT_AMBULATORY_CARE_PROVIDER_SITE_OTHER): Payer: Medicare Other | Admitting: Physician Assistant

## 2015-09-05 ENCOUNTER — Encounter: Payer: Self-pay | Admitting: Physician Assistant

## 2015-09-05 VITALS — BP 122/80 | HR 66 | Ht 61.0 in | Wt 104.7 lb

## 2015-09-05 DIAGNOSIS — I10 Essential (primary) hypertension: Secondary | ICD-10-CM

## 2015-09-05 DIAGNOSIS — I491 Atrial premature depolarization: Secondary | ICD-10-CM | POA: Diagnosis not present

## 2015-09-05 LAB — TSH: TSH: 1.99 mIU/L

## 2015-09-05 NOTE — Progress Notes (Addendum)
Cardiology Office Note   Date:  09/05/2015   ID:  Cynthia Watkins, DOB 1924-05-27, MRN [id number]  PCP:  [name], DO  Cardiologist:  Dr Martinique  Leigh Kaeding, PA-C   Chief Complaint  Patient presents with  . Follow-up    monitor results    History of Present Illness: Cynthia Watkins is a 82 y.o. female with a history of HTN, HLD, RBBB, nl EF w/ nl diast function by echo 2016.  Seen 06/26 for palpitations, previous monitor no sig prob. 30-day event monitor performed w/ PACs and a brief 7 bt run NSVT  Cynthia Watkins presents for follow-up of the event monitor and further evaluation of her palpitations.  She was given the results of the event monitor and was very relieved to know that. She has a diary that includes symptoms as well as vital signs. She takes a morning walk and after a while, her blood pressure is frequently in the 80s or 90s, and elevated blood pressure.  The palpitations have woken her several times at night. This is always associated with hypertension. Her systolic blood pressure will be in the 130s to 150s. Other than during these episodes, her blood pressure is well controlled or low. Her heart rate is in the 50s at times. She does not report being dizzy or lightheaded except occasionally after her walk, when her blood pressure readings will be in the 80s or 90s.  She takes half of an atenolol daily, and will occasionally take another half tablet if she is woke palpitations and her blood pressure is elevated. She takes the Diovan when her blood pressure is elevated and this has not happened a long time.  She does not get the longer lasting palpitations that can cause her to wake in the night and feels as if her heart is pounding very often. She gets other palpitations and skipped some daily basis, but they do not upset her. The longer palpitations are concerning to her, but she was very reassured to find out they are not dangerous to her.   Past Medical  History:  Diagnosis Date  . Acute upper respiratory infections of unspecified site 06/09/2011  . Breast cancer ([place]) 12/11/2008  . Fatigue 01/2012  . Hypercholesterolemia    on lipitor  . Hypertension   . Mitral valve prolapse   . Other abnormal blood chemistry 01/21/2010  . Palpitations 08/05/2000  . Right bundle branch block   . Senile osteoporosis 09/28/2001    Past Surgical History:  Procedure Laterality Date  . BREAST LUMPECTOMY Right 12/27/2007  . LAPAROSCOPIC LYSIS OF ADHESIONS  07/15/2006   [name]  . OTHER SURGICAL HISTORY    . TONSILLECTOMY    . TUBAL LIGATION  1963    Current Outpatient Prescriptions  Medication Sig Dispense Refill  . atenolol (TENORMIN) 25 MG tablet Take 1/2 tablet daily ( 12.5 mg ) 180 tablet 3  . Biotin 5000 MCG CAPS Take by mouth daily.    . Calcium Carb-Cholecalciferol 600-800 MG-UNIT TABS Take by mouth. Take one tablet daily    . cholecalciferol (VITAMIN D) 1000 UNITS tablet Take 1 tablet (1,000 Units total) by mouth daily.    . tamoxifen (NOLVADEX) 20 MG tablet Take 1 tablet (20 mg total) by mouth daily. 90 tablet PRN  . valsartan (DIOVAN) 40 MG tablet Take 40 mg by mouth as needed.      No current facility-administered medications for this visit.     Allergies:  Review of patient's allergies indicates no known allergies.    Social History:  The patient  reports that she quit smoking about 61 years ago. She has never used smokeless tobacco. She reports that she drinks alcohol. She reports that she does not use drugs.   Family History:  The patient's family history includes Alzheimer's disease in her sister; Cancer in her father; Stroke in her mother.    ROS:  Please see the history of present illness. All other systems are reviewed and negative.    PHYSICAL EXAM: VS:  BP 122/80   Pulse 66   Ht 5\' 1"  (1.549 m)   Wt 104 lb 11.2 oz (47.5 kg)   BMI 19.78 kg/m  , BMI Body mass index is 19.78 kg/m. GEN: Well nourished, well developed,  female in no acute distress  HEENT: normal for age  Neck: no JVD, no carotid bruit, no masses Cardiac: Slightly irregular rate and rhythm; soft murmur, no rubs, or gallops Respiratory:  clear to auscultation bilaterally, normal work of breathing GI: soft, nontender, nondistended, + BS MS: no deformity or atrophy; no edema; distal pulses are 2+ in all 4 extremities   Skin: warm and dry, no rash Neuro:  Strength and sensation are intact Psych: euthymic mood, full affect    EKG:  EKG is ordered today. ECG shows sinus rhythm with a ventricular rate of 66 and a right bundle branch block which is old   Recent Labs: 08/09/2015: ALT 14; Hemoglobin 13.3; Platelets 251 08/21/2015: BUN 15; Creatinine 0.8; Potassium 4.7; Sodium 135    Lipid Panel    Component Value Date/Time   CHOL 185 08/09/2015 0800   TRIG 74 08/09/2015 0800   HDL 76 (A) 08/09/2015 0800   LDLCALC 94 08/09/2015 0800     Wt Readings from Last 3 Encounters:  09/05/15 104 lb 11.2 oz (47.5 kg)  08/15/15 104 lb (47.2 kg)  07/02/15 105 lb 12.8 oz (48 kg)     Other studies Reviewed: Additional studies/ records that were reviewed today include: Office notes and testing.  ASSESSMENT AND PLAN:  1.  Palpitations: An event monitor showed frequent PACs and short runs of nonsustained VT. Her resting heart rate is too low to allow for up titration of her beta blocker. She is tolerating the palpitations well at this time. She is on a low-dose of beta blocker and although she is bradycardic at times, she is generally not symptomatic with this. Therefore, no dose changes at this time. She understands that she is to contact us for symptoms and we will adjust her medications as needed.  As her EF is normal and she has no history of syncope, no further evaluation is indicated for the brief runs of nonsustained VT. A recent BMET showed a potassium level of 4.7.  I do not see a TSH within the last 2 years, we will order one for completeness,  but it will most likely be normal.  2. Hyponatremia: This is mild and the lowest recent sodium level is 131. This is to be followed by her family physician, but it is not contributing to her arrhythmia.   Current medicines are reviewed at length with the patient today.  The patient does not have concerns regarding medicines.  The following changes have been made:  no change  Labs/ tests ordered today include:   Orders Placed This Encounter  Procedures  . TSH  . EKG 12-Lead     Disposition:   FU with Dr. Martinique  Augusto Garbe  09/05/2015 4:48 PM    [facility] Phone: [phone]; Fax: [phone]  This note was written with the assistance of speech recognition software. Please excuse any transcriptional errors.

## 2015-09-05 NOTE — Patient Instructions (Addendum)
Medication Instructions:  Continue current medications  Labwork: TSH  Testing/Procedures: None Ordered  Follow-Up: Your physician wants you to follow-up in: 1 Year with dr Martinique. You will receive a reminder letter in the mail two months in advance. If you don't receive a letter, please call our office to schedule the follow-up appointment.   Any Other Special Instructions Will Be Listed Below (If Applicable).   If you need a refill on your cardiac medications before your next appointment, please call your pharmacy.

## 2015-09-12 ENCOUNTER — Ambulatory Visit: Payer: Medicare Other | Admitting: Cardiology

## 2015-09-24 ENCOUNTER — Other Ambulatory Visit (HOSPITAL_BASED_OUTPATIENT_CLINIC_OR_DEPARTMENT_OTHER): Payer: Medicare Other

## 2015-09-24 DIAGNOSIS — C50511 Malignant neoplasm of lower-outer quadrant of right female breast: Secondary | ICD-10-CM | POA: Diagnosis not present

## 2015-09-24 LAB — COMPREHENSIVE METABOLIC PANEL
ALBUMIN: 3.5 g/dL (ref 3.5–5.0)
ALK PHOS: 48 U/L (ref 40–150)
ALT: 17 U/L (ref 0–55)
ANION GAP: 7 meq/L (ref 3–11)
AST: 24 U/L (ref 5–34)
BILIRUBIN TOTAL: 0.37 mg/dL (ref 0.20–1.20)
BUN: 17 mg/dL (ref 7.0–26.0)
CALCIUM: 8.7 mg/dL (ref 8.4–10.4)
CHLORIDE: 97 meq/L — AB (ref 98–109)
CO2: 27 mEq/L (ref 22–29)
CREATININE: 0.8 mg/dL (ref 0.6–1.1)
EGFR: 69 mL/min/{1.73_m2} — ABNORMAL LOW (ref >90)
Glucose: 89 mg/dl (ref 70–140)
Potassium: 4.5 mEq/L (ref 3.5–5.1)
Sodium: 130 mEq/L — ABNORMAL LOW (ref 136–145)
TOTAL PROTEIN: 6.2 g/dL — AB (ref 6.4–8.3)

## 2015-09-24 LAB — CBC WITH DIFFERENTIAL/PLATELET
BASO%: 0.8 % (ref 0.0–2.0)
Basophils Absolute: 0.1 10*3/uL (ref 0.0–0.1)
EOS%: 1 % (ref 0.0–7.0)
Eosinophils Absolute: 0.1 10*3/uL (ref 0.0–0.5)
HEMATOCRIT: 38 % (ref 34.8–46.6)
HEMOGLOBIN: 12.7 g/dL (ref 11.6–15.9)
LYMPH#: 2 10*3/uL (ref 0.9–3.3)
LYMPH%: 30.2 % (ref 14.0–49.7)
MCH: 33.4 pg (ref 25.1–34.0)
MCHC: 33.5 g/dL (ref 31.5–36.0)
MCV: 99.5 fL (ref 79.5–101.0)
MONO#: 0.5 10*3/uL (ref 0.1–0.9)
MONO%: 7.9 % (ref 0.0–14.0)
NEUT#: 3.9 10*3/uL (ref 1.5–6.5)
NEUT%: 60.1 % (ref 38.4–76.8)
PLATELETS: 232 10*3/uL (ref 145–400)
RBC: 3.81 10*6/uL (ref 3.70–5.45)
RDW: 13.6 % (ref 11.2–14.5)
WBC: 6.5 10*3/uL (ref 3.9–10.3)

## 2015-09-25 DIAGNOSIS — L821 Other seborrheic keratosis: Secondary | ICD-10-CM | POA: Diagnosis not present

## 2015-09-25 DIAGNOSIS — D1801 Hemangioma of skin and subcutaneous tissue: Secondary | ICD-10-CM | POA: Diagnosis not present

## 2015-09-30 NOTE — Progress Notes (Signed)
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 Preoperative MRI showed solitary mass. She had lumpectomy 12-27-2007 with 1.5 cm grade 3 invasive ductal carcinoma, no axillary nodes evaluated. She had RT to breast and axilla by Dr [name]. Patient was not inclined to pursue adjuvant chemotherapy or herceptin at diagnosis. She was on Femara from 03/2008 thru 11/2009 when this was changed to tamoxifen due to osteoporosis. She has tolerated the tamoxifen without difficulty. She has not had hysterectomy. Last mammograms were at [facility] 11[facility]21-2011 with scattered fibroglandular densities and no mammographic findings of concern. She had unremarkable CXR with ED evaluation for infectious cough in Jan 2014."  The patient's subsequent history is as detailed below  INTERVAL HISTORY: Cynthia Watkins returns today for followup of her estrogen receptor positive breast cancer. She continues on tamoxifen. She tolerates that well. Currently she denies hot flashes or vaginal wetness problems from this medication. She obtains it at a good price.  Repeat bone density at [facility] shows a T score of -3.5, relatively stable as compared to 2 years prior, when it was -3.3. She has received 1 prior dose of zolendronate, September 2015, and will receive a second dose today. She tolerated the first dose with no side effects that she is aware of.  REVIEW OF SYSTEMS: She tells me she was found to have palpitations and was started on atenolol, with good control. A detailed review  of systems today was otherwise stable  PAST MEDICAL HISTORY: Past Medical History:  Diagnosis Date  . Acute upper respiratory infections of unspecified site 06/09/2011  . Breast cancer ([place]) 12/11/2008  . Fatigue 01/2012  . Hypercholesterolemia    on lipitor  . Hypertension   . Mitral valve prolapse   . Other abnormal blood chemistry 01/21/2010  . Palpitations 08/05/2000  . Right bundle branch block   . Senile osteoporosis 09/28/2001    PAST SURGICAL HISTORY: Past Surgical History:  Procedure Laterality Date  . BREAST LUMPECTOMY Right 12/27/2007  . LAPAROSCOPIC LYSIS OF ADHESIONS  07/15/2006   [name]  . OTHER SURGICAL HISTORY    . TONSILLECTOMY    . TUBAL LIGATION  1963    FAMILY HISTORY Family History  Problem Relation Age of Onset  . Stroke Mother   . Cancer Father     colon  . Alzheimer's disease Sister    the patient's father had a history of colon cancer. 2 of his sisters had breast cancer, but not diagnosed before age 67. The patient's mother lived to be 40. The patient's sister was diagnosed with breast cancer in her 54s.  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 Her husband, [name] was a pediatric surgeon and died earlier this year. Her son [name] of course is a urologist locally. The patient also has children in [place] and [place]. Her fourth child unfortunately died in a drowning accident. She has 7 grandchildren and 2 stepgrandchildren. She attends the  [employer]    ADVANCED DIRECTIVES: In place; her son [name] is her healthcare power of attorney   HEALTH MAINTENANCE: Social History  Substance Use Topics  . Smoking status: Former Smoker    Quit date: 08/06/1954  . Smokeless tobacco: Never Used  . Alcohol use Yes     Comment: Occasionally      Colonoscopy:  PAP:  Bone density: November 2013/[facility]/T - 3.3  Lipid panel:  No Known  Allergies  Current Outpatient Prescriptions  Medication Sig Dispense Refill  . atenolol (TENORMIN) 25 MG tablet Take 1/2 tablet daily ( 12.5 mg ) 180 tablet 3  . Biotin 5000 MCG CAPS Take by mouth daily.    . Calcium Carb-Cholecalciferol 600-800 MG-UNIT TABS Take by mouth. Take one tablet daily    . tamoxifen (NOLVADEX) 20 MG tablet Take 1 tablet (20 mg total) by mouth daily. 90 tablet PRN  . valsartan (DIOVAN) 40 MG tablet Take 40 mg by mouth as needed.      No current facility-administered medications for this visit.    Facility-Administered Medications Ordered in Other Visits  Medication Dose Route Frequency Provider Last Rate Last Dose  . 0.9 %  sodium chloride infusion   Intravenous Continuous Cynthia Cruel, MD   Stopped at 10/01/15 1650    OBJECTIVE: Elderly white woman in no acute distress Vitals:   10/01/15 1438  BP: 138/61  Pulse: 62  Resp: 18  Temp: 98.5 F (36.9 C)     Body mass index is 19.84 kg/m.    ECOG FS:0 - Asymptomatic   Sclerae unicteric, pupils round and equal Oropharynx clear and moist-- no thrush or other lesions No cervical or supraclavicular adenopathy Lungs no rales or rhonchi Heart regular rate and rhythm Abd soft, nontender, positive bowel sounds MSK no focal spinal tenderness, no upper extremity lymphedema Neuro: nonfocal, well oriented, appropriate affect Breasts: The right breast has undergone lumpectomy and radiation, and there is no evidence of disease recurrence. The right axilla is benign. The left breast is unremarkable.   LAB RESULTS:  CMP     Component Value Date/Time   NA 130 (L) 09/24/2015 1514   K 4.5 09/24/2015 1514   CL 101 09/22/2013 1401   CL 98 02/17/2012 1500   CO2 27 09/24/2015 1514   GLUCOSE 89 09/24/2015 1514   GLUCOSE 134 (H) 02/17/2012 1500   BUN 17.0 09/24/2015 1514   CREATININE 0.8 09/24/2015 1514   CALCIUM 8.7 09/24/2015 1514   PROT 6.2 (L) 09/24/2015 1514   ALBUMIN 3.5 09/24/2015 1514   AST 24  09/24/2015 1514   ALT 17 09/24/2015 1514   ALKPHOS 48 09/24/2015 1514   BILITOT 0.37 09/24/2015 1514   GFRNONAA >60 12/23/2007 0935   GFRAA  12/23/2007 0935    >60        The eGFR has been calculated using the MDRD equation. This calculation has not been validated in all clinical    I No results found for: SPEP  Lab Results  Component Value Date   WBC 6.5 09/24/2015   NEUTROABS 3.9 09/24/2015   HGB 12.7 09/24/2015   HCT 38.0 09/24/2015   MCV 99.5 09/24/2015   PLT 232 09/24/2015      Chemistry      Component Value Date/Time   NA 130 (L) 09/24/2015 1514   K 4.5 09/24/2015 1514   CL 101 09/22/2013 1401   CL 98 02/17/2012 1500   CO2 27 09/24/2015 1514   BUN 17.0 09/24/2015 1514  CREATININE 0.8 09/24/2015 1514   GLU 97 08/21/2015 0700      Component Value Date/Time   CALCIUM 8.7 09/24/2015 1514   ALKPHOS 48 09/24/2015 1514   AST 24 09/24/2015 1514   ALT 17 09/24/2015 1514   BILITOT 0.37 09/24/2015 1514       Lab Results  Component Value Date   LABCA2 33 02/17/2012    No components found for: [id number]  No results for input(s): INR in the last 168 hours.  Urinalysis    Component Value Date/Time   COLORURINE YELLOW 12/23/2007 0945   APPEARANCEUR CLEAR 12/23/2007 0945   LABSPEC 1.018 12/23/2007 0945   PHURINE 7.0 12/23/2007 0945   GLUCOSEU NEGATIVE 12/23/2007 0945   HGBUR NEGATIVE 12/23/2007 0945   BILIRUBINUR NEGATIVE 12/23/2007 0945   KETONESUR NEGATIVE 12/23/2007 0945   PROTEINUR NEGATIVE 12/23/2007 0945   UROBILINOGEN 0.2 12/23/2007 0945   NITRITE NEGATIVE 12/23/2007 0945   LEUKOCYTESUR TRACE (A) 12/23/2007 0945    STUDIES: Mammography December 2016 and bone densities January 2017 reviewed with the patient  ASSESSMENT: 82 y.o. [facility] resident status post right breast lower outer quadrant lumpectomy 12/27/2007 for a pT1c pNX, stage IA invasive ductal carcinoma, grade 3, estrogen receptor 98% positive, progesterone receptor 32% positive, with  an MIB-1 of 14% and HercepTest positive at 3+  (1) declined adjuvant chemotherapy/trastuzumab  (2) completed adjuvant radiation to the right breast and axilla February 2010  (3) antiestrogen therapy: Anastrozole March 2010 through November 2011, at which point she switched to tamoxifen  (4) osteoporosis;  DEXA scan at [facility] 11/21/2011 showed a T score of -3.3  (a) zolendronate given 09/22/2013,   (b) repeat bone density scan at [facility] January 2017 showed a T score of -3.5  (c) zolendronate repeated 10/01/2015  PLAN: Colene is almost 8 years out from definitive surgery for her breast cancer with no evidence of disease recurrence. This is very favorable.  We are continuing anti-estrogens to 10 years because her cancer was HER-2 positive and she did not receive anti-HER-2 immunotherapy. She is tolerating tamoxifen well, and it should help with her bone density.  She will receive zolendronate today and again next year when she returns to see me. She will then repeat a bone density January 2019.  Her sodium has been on the low side recently and again today. Doubtless this is due to the valsartan. I am copying labs to her other physicians in case they wish to consider a different antihypertensive.  She knows to call for any problems that may develop before her next visit.  Cynthia Cruel, MD   10/01/2015 5:28 PM

## 2015-10-01 ENCOUNTER — Encounter: Payer: Self-pay | Admitting: Oncology

## 2015-10-01 ENCOUNTER — Ambulatory Visit (HOSPITAL_BASED_OUTPATIENT_CLINIC_OR_DEPARTMENT_OTHER): Payer: Medicare Other

## 2015-10-01 ENCOUNTER — Other Ambulatory Visit: Payer: Self-pay | Admitting: Oncology

## 2015-10-01 ENCOUNTER — Ambulatory Visit (HOSPITAL_BASED_OUTPATIENT_CLINIC_OR_DEPARTMENT_OTHER): Payer: Medicare Other | Admitting: Oncology

## 2015-10-01 VITALS — BP 138/61 | HR 62 | Temp 98.5°F | Resp 18 | Ht 61.0 in | Wt 105.0 lb

## 2015-10-01 DIAGNOSIS — M810 Age-related osteoporosis without current pathological fracture: Secondary | ICD-10-CM

## 2015-10-01 DIAGNOSIS — C50511 Malignant neoplasm of lower-outer quadrant of right female breast: Secondary | ICD-10-CM

## 2015-10-01 MED ORDER — ZOLEDRONIC ACID 4 MG/5ML IV CONC
3.0000 mg | Freq: Once | INTRAVENOUS | Status: AC
  Administered 2015-10-01: 3 mg via INTRAVENOUS
  Filled 2015-10-01: qty 3.75

## 2015-10-01 MED ORDER — TAMOXIFEN CITRATE 20 MG PO TABS: 20.0000 mg | ORAL_TABLET | Freq: Every day | ORAL | 99 refills | Status: DC

## 2015-10-01 MED ORDER — SODIUM CHLORIDE 0.9 % IV SOLN
INTRAVENOUS | Status: DC
  Administered 2015-10-01: 16:00:00 via INTRAVENOUS

## 2015-10-01 NOTE — Patient Instructions (Signed)

## 2015-10-04 ENCOUNTER — Telehealth: Payer: Self-pay | Admitting: *Deleted

## 2015-10-04 NOTE — Telephone Encounter (Signed)
Spoke with patient and advised results, pt scheduled appt 10/31/15

## 2015-10-04 NOTE — Telephone Encounter (Signed)
-----   Message from Gayland Curry, DO sent at 10/02/2015 10:34 AM EDT ----- Can you call Mrs. Wirt and see if she wants to come in about her low sodium?  It's been oscillating which made me think it was not actually from her medication, but more from her fluid intake.  Dr. Jana Hakim was concerned it might be from her valsartan.  ----- Message ----- From: Chauncey Cruel, MD Sent: 10/01/2015   5:35 PM To: Gayland Curry, DO

## 2015-10-31 ENCOUNTER — Encounter: Payer: Self-pay | Admitting: Internal Medicine

## 2015-10-31 ENCOUNTER — Non-Acute Institutional Stay: Payer: Medicare Other | Admitting: Internal Medicine

## 2015-10-31 VITALS — BP 128/70 | HR 57 | Temp 98.1°F | Wt 106.0 lb

## 2015-10-31 DIAGNOSIS — I472 Ventricular tachycardia: Secondary | ICD-10-CM | POA: Diagnosis not present

## 2015-10-31 DIAGNOSIS — E871 Hypo-osmolality and hyponatremia: Secondary | ICD-10-CM | POA: Diagnosis not present

## 2015-10-31 DIAGNOSIS — Z170 Estrogen receptor positive status [ER+]: Secondary | ICD-10-CM

## 2015-10-31 DIAGNOSIS — M810 Age-related osteoporosis without current pathological fracture: Secondary | ICD-10-CM | POA: Diagnosis not present

## 2015-10-31 DIAGNOSIS — C50511 Malignant neoplasm of lower-outer quadrant of right female breast: Secondary | ICD-10-CM

## 2015-10-31 DIAGNOSIS — I10 Essential (primary) hypertension: Secondary | ICD-10-CM | POA: Diagnosis not present

## 2015-10-31 DIAGNOSIS — E7800 Pure hypercholesterolemia, unspecified: Secondary | ICD-10-CM

## 2015-10-31 DIAGNOSIS — I4729 Other ventricular tachycardia: Secondary | ICD-10-CM

## 2015-10-31 NOTE — Progress Notes (Signed)
Location:  Occupational psychologist of Service:  Clinic (12)  Provider: Israella Hubert L. Mariea Clonts, D.O., C.M.D.  Code Status: DNR Goals of Care:  Advanced Directives 10/31/2015  Does patient have an advance directive? Yes  Type of Advance Directive [place]  Does patient want to make changes to advanced directive? -  Copy of advanced directive(s) in chart? Yes    Chief Complaint  Patient presents with  . Follow-up    low sodium    HPI: Patient is a 82 y.o. female seen today for medical management of chronic diseases.    She never takes the diovan anymore b/c her bp has been good. So this does not explain her low sodium.  She has deliberately avoided sodium in her diet and says she can be less concerned about this.    Weight has been stable.    Had her zolendronate infusion 10/01/15.   Next bone density 2019.    Says b/c her palpitations were bothersome, they have given her medication to suppress her heart beat and she cannot walk as fast now.  TSH was fine.    Having a loss of feeling in her right hand.  It's manageable, but she hopes it does not worsen.  Worst early in am when she first gets up.  Not severe, but tingling that goes away after a little while.  First three fingers are involved.  No neck pain.  Does sleep on her right side most of the time.  Does not sleep well.  Does get up to urinate 3 times but sometimes wakes up first and then has to go not the other way around.  Not terribly anxious or worried about things.  Goes to bed at 9/930, gets up around 5/530.  No problem going to sleep, but staying asleep.  Discussed worries behind taking medications for sleep and she doesn't want one.    Still is walking an hour per day.    Past Medical History:  Diagnosis Date  . Acute upper respiratory infections of unspecified site 06/09/2011  . Breast cancer ([place]) 12/11/2008  . Fatigue 01/2012  . Hypercholesterolemia    on lipitor  . Hypertension   .  Mitral valve prolapse   . Other abnormal blood chemistry 01/21/2010  . Palpitations 08/05/2000  . Right bundle branch block   . Senile osteoporosis 09/28/2001    Past Surgical History:  Procedure Laterality Date  . BREAST LUMPECTOMY Right 12/27/2007  . LAPAROSCOPIC LYSIS OF ADHESIONS  07/15/2006   [name]  . OTHER SURGICAL HISTORY    . TONSILLECTOMY    . TUBAL LIGATION  1963    No Known Allergies    Medication List       Accurate as of 10/31/15  3:30 PM. Always use your most recent med list.          atenolol 25 MG tablet Commonly known as:  TENORMIN Take 1/2 tablet daily ( 12.5 mg )   Biotin 5000 MCG Caps Take by mouth daily.   CALCIUM 1200 1200-1000 MG-UNIT Chew Chew by mouth daily.   tamoxifen 20 MG tablet Commonly known as:  NOLVADEX Take 1 tablet (20 mg total) by mouth daily.   valsartan 40 MG tablet Commonly known as:  DIOVAN Take 40 mg by mouth as needed.      Review of Systems:  Review of Systems  Constitutional: Negative for chills, fever and malaise/fatigue.  HENT: Positive for hearing loss. Negative for congestion.  Eyes:       Glasses  Respiratory: Negative for cough and shortness of breath.   Cardiovascular: Negative for chest pain, palpitations and leg swelling.  Gastrointestinal: Negative for abdominal pain, blood in stool, constipation, diarrhea and melena.  Genitourinary: Positive for frequency. Negative for dysuria.  Musculoskeletal: Negative for falls and joint pain.  Skin: Negative for itching and rash.  Neurological: Positive for tingling. Negative for weakness.       Of first three fingers, but not yet bothersome enough for intervention  Psychiatric/Behavioral: Negative for depression and memory loss.    Health Maintenance  Topic Date Due  . TETANUS/TDAP  05/16/1943  . MAMMOGRAM  12/18/2015  . INFLUENZA VACCINE  Completed  . DEXA SCAN  Completed  . ZOSTAVAX  Completed  . PNA vac Low Risk Adult  Completed    Physical  Exam: Vitals:   10/31/15 1522  BP: 128/70  Pulse: (!) 57  Temp: 98.1 F (36.7 C)  TempSrc: Oral  SpO2: 96%  Weight: 106 lb (48.1 kg)   Body mass index is 20.03 kg/m. Physical Exam  Constitutional: She is oriented to person, place, and time. She appears well-developed and well-nourished. No distress.  Cardiovascular: Normal rate and regular rhythm.   Murmur heard. Pulmonary/Chest: Effort normal and breath sounds normal.  Abdominal: Soft. Bowel sounds are normal.  Musculoskeletal: Normal range of motion.  Neurological: She is alert and oriented to person, place, and time.  Skin: Skin is warm and dry.    Labs reviewed: Basic Metabolic Panel:  Recent Labs  08/09/15 0800 08/21/15 0700 09/05/15 1456 09/24/15 1514  NA 131* 135*  --  130*  K 4.8 4.7  --  4.5  CO2  --   --   --  27  GLUCOSE  --   --   --  89  BUN 13 15  --  17.0  CREATININE 0.8 0.8  --  0.8  CALCIUM  --   --   --  8.7  TSH  --   --  1.99  --    Liver Function Tests:  Recent Labs  02/06/15 08/09/15 0800 09/24/15 1514  AST 21 19 24   ALT 13 14 17   ALKPHOS 40 41 48  BILITOT  --   --  0.37  PROT  --   --  6.2*  ALBUMIN  --   --  3.5   No results for input(s): LIPASE, AMYLASE in the last 8760 hours. No results for input(s): AMMONIA in the last 8760 hours. CBC:  Recent Labs  02/06/15 08/09/15 0800 09/24/15 1514  WBC 5.9 6.2 6.5  NEUTROABS  --   --  3.9  HGB 13.1 13.3 12.7  HCT 39 39 38.0  MCV  --   --  99.5  PLT 293 251 232   Lipid Panel:  Recent Labs  08/09/15 0800  CHOL 185  HDL 76*  LDLCALC 94  TRIG 74   Assessment/Plan 1. Hyponatremia -this has been ongoing for several years -she is not using the valsartan it turns out so it cannot explain her sodium -she has no problems from it so we are going to monitor  2. Paroxysmal VT (HCC) -cont atenolol and monitor, no recent symptoms--is not as peppy on this, but tolerates it  3. Essential hypertension, benign -bp well controlled,  not using valsartan  4. Hypercholesterolemia -lipids at goal, no changes  5. Senile osteoporosis -cont zoledronic acid through oncology  6. Malignant neoplasm of lower-outer quadrant of  right breast of female, estrogen receptor positive ([place]) -cont tamoxifen and f/u with Dr. Jana Hakim as planned  Labs/tests ordered:  No new added Next appt:  02/20/2016 for awv, cpe  Louetta Hollingshead L. Santiago Stenzel, D.O. [facility] [address], [place] [address] Cell Phone (Mon-Fri 8am-5pm):  [phone] On Call:  [phone] & follow prompts after 5pm & weekends Office Phone:  [phone] Office Fax:  [phone]

## 2016-01-07 DIAGNOSIS — E871 Hypo-osmolality and hyponatremia: Secondary | ICD-10-CM

## 2016-01-07 HISTORY — DX: Hypo-osmolality and hyponatremia: E87.1

## 2016-01-23 ENCOUNTER — Inpatient Hospital Stay (HOSPITAL_COMMUNITY)
Admission: EM | Admit: 2016-01-23 | Discharge: 2016-01-29 | DRG: 071 | Disposition: A | Discharge status: Home Health | Payer: Medicare Other | Attending: Family Medicine | Admitting: Family Medicine

## 2016-01-23 ENCOUNTER — Emergency Department (HOSPITAL_COMMUNITY): Payer: Medicare Other

## 2016-01-23 ENCOUNTER — Inpatient Hospital Stay (HOSPITAL_COMMUNITY): Payer: Medicare Other

## 2016-01-23 ENCOUNTER — Encounter (HOSPITAL_COMMUNITY): Payer: Self-pay | Admitting: Emergency Medicine

## 2016-01-23 DIAGNOSIS — R739 Hyperglycemia, unspecified: Secondary | ICD-10-CM | POA: Diagnosis not present

## 2016-01-23 DIAGNOSIS — R197 Diarrhea, unspecified: Secondary | ICD-10-CM | POA: Diagnosis not present

## 2016-01-23 DIAGNOSIS — R4701 Aphasia: Secondary | ICD-10-CM | POA: Diagnosis present

## 2016-01-23 DIAGNOSIS — Z923 Personal history of irradiation: Secondary | ICD-10-CM | POA: Diagnosis not present

## 2016-01-23 DIAGNOSIS — E871 Hypo-osmolality and hyponatremia: Secondary | ICD-10-CM | POA: Diagnosis not present

## 2016-01-23 DIAGNOSIS — E785 Hyperlipidemia, unspecified: Secondary | ICD-10-CM | POA: Diagnosis present

## 2016-01-23 DIAGNOSIS — C50511 Malignant neoplasm of lower-outer quadrant of right female breast: Secondary | ICD-10-CM | POA: Diagnosis not present

## 2016-01-23 DIAGNOSIS — Z853 Personal history of malignant neoplasm of breast: Secondary | ICD-10-CM

## 2016-01-23 DIAGNOSIS — R9431 Abnormal electrocardiogram [ECG] [EKG]: Secondary | ICD-10-CM | POA: Diagnosis present

## 2016-01-23 DIAGNOSIS — E7800 Pure hypercholesterolemia, unspecified: Secondary | ICD-10-CM | POA: Diagnosis not present

## 2016-01-23 DIAGNOSIS — G9340 Encephalopathy, unspecified: Secondary | ICD-10-CM | POA: Diagnosis not present

## 2016-01-23 DIAGNOSIS — R1013 Epigastric pain: Secondary | ICD-10-CM | POA: Diagnosis not present

## 2016-01-23 DIAGNOSIS — I10 Essential (primary) hypertension: Secondary | ICD-10-CM | POA: Diagnosis not present

## 2016-01-23 DIAGNOSIS — Z9221 Personal history of antineoplastic chemotherapy: Secondary | ICD-10-CM

## 2016-01-23 DIAGNOSIS — Z79899 Other long term (current) drug therapy: Secondary | ICD-10-CM

## 2016-01-23 DIAGNOSIS — M810 Age-related osteoporosis without current pathological fracture: Secondary | ICD-10-CM | POA: Diagnosis present

## 2016-01-23 DIAGNOSIS — Z87891 Personal history of nicotine dependence: Secondary | ICD-10-CM | POA: Diagnosis not present

## 2016-01-23 DIAGNOSIS — G9341 Metabolic encephalopathy: Secondary | ICD-10-CM | POA: Diagnosis not present

## 2016-01-23 DIAGNOSIS — Z170 Estrogen receptor positive status [ER+]: Secondary | ICD-10-CM | POA: Diagnosis present

## 2016-01-23 DIAGNOSIS — Z79810 Long term (current) use of selective estrogen receptor modulators (SERMs): Secondary | ICD-10-CM

## 2016-01-23 DIAGNOSIS — C801 Malignant (primary) neoplasm, unspecified: Secondary | ICD-10-CM

## 2016-01-23 DIAGNOSIS — E876 Hypokalemia: Secondary | ICD-10-CM | POA: Diagnosis present

## 2016-01-23 DIAGNOSIS — R4182 Altered mental status, unspecified: Secondary | ICD-10-CM | POA: Diagnosis present

## 2016-01-23 DIAGNOSIS — I639 Cerebral infarction, unspecified: Secondary | ICD-10-CM

## 2016-01-23 DIAGNOSIS — R918 Other nonspecific abnormal finding of lung field: Secondary | ICD-10-CM | POA: Diagnosis not present

## 2016-01-23 HISTORY — DX: Hypo-osmolality and hyponatremia: E87.1

## 2016-01-23 LAB — COMPREHENSIVE METABOLIC PANEL
ALK PHOS: 39 U/L (ref 38–126)
ALT: 18 U/L (ref 14–54)
AST: 31 U/L (ref 15–41)
Albumin: 3.7 g/dL (ref 3.5–5.0)
Anion gap: 15 (ref 5–15)
BILIRUBIN TOTAL: 1.2 mg/dL (ref 0.3–1.2)
BUN: 9 mg/dL (ref 6–20)
CALCIUM: 7.7 mg/dL — AB (ref 8.9–10.3)
CO2: 19 mmol/L — AB (ref 22–32)
Chloride: 73 mmol/L — ABNORMAL LOW (ref 101–111)
Creatinine, Ser: 0.55 mg/dL (ref 0.44–1.00)
GFR calc non Af Amer: >60 mL/min (ref >60)
Glucose, Bld: 154 mg/dL — ABNORMAL HIGH (ref 65–99)
Potassium: 3.4 mmol/L — ABNORMAL LOW (ref 3.5–5.1)
SODIUM: 107 mmol/L — AB (ref 135–145)
TOTAL PROTEIN: 6 g/dL — AB (ref 6.5–8.1)

## 2016-01-23 LAB — BASIC METABOLIC PANEL
Anion gap: 13 (ref 5–15)
Anion gap: 12 (ref 5–15)
Anion gap: 14 (ref 5–15)
Anion gap: 12 (ref 5–15)
BUN: 7 mg/dL (ref 6–20)
BUN: 6 mg/dL (ref 6–20)
BUN: 6 mg/dL (ref 6–20)
BUN: 6 mg/dL (ref 6–20)
CALCIUM: 7.4 mg/dL — AB (ref 8.9–10.3)
CO2: 20 mmol/L — ABNORMAL LOW (ref 22–32)
CO2: 20 mmol/L — ABNORMAL LOW (ref 22–32)
CO2: 18 mmol/L — ABNORMAL LOW (ref 22–32)
CO2: 23 mmol/L (ref 22–32)
CREATININE: 0.6 mg/dL (ref 0.44–1.00)
CREATININE: 0.6 mg/dL (ref 0.44–1.00)
CREATININE: 0.42 mg/dL — AB (ref 0.44–1.00)
CREATININE: 0.51 mg/dL (ref 0.44–1.00)
Calcium: 7.6 mg/dL — ABNORMAL LOW (ref 8.9–10.3)
Calcium: 7.5 mg/dL — ABNORMAL LOW (ref 8.9–10.3)
Calcium: 7.7 mg/dL — ABNORMAL LOW (ref 8.9–10.3)
Chloride: 73 mmol/L — ABNORMAL LOW (ref 101–111)
Chloride: 75 mmol/L — ABNORMAL LOW (ref 101–111)
Chloride: 80 mmol/L — ABNORMAL LOW (ref 101–111)
Chloride: 77 mmol/L — ABNORMAL LOW (ref 101–111)
GFR calc Af Amer: >60 mL/min (ref >60)
GFR calc non Af Amer: >60 mL/min (ref >60)
GFR calc non Af Amer: >60 mL/min (ref >60)
Glucose, Bld: 153 mg/dL — ABNORMAL HIGH (ref 65–99)
Glucose, Bld: 149 mg/dL — ABNORMAL HIGH (ref 65–99)
Glucose, Bld: 120 mg/dL — ABNORMAL HIGH (ref 65–99)
Glucose, Bld: 163 mg/dL — ABNORMAL HIGH (ref 65–99)
POTASSIUM: 3.5 mmol/L (ref 3.5–5.1)
POTASSIUM: 3.6 mmol/L (ref 3.5–5.1)
Potassium: 3.4 mmol/L — ABNORMAL LOW (ref 3.5–5.1)
Potassium: 3.6 mmol/L (ref 3.5–5.1)
SODIUM: 106 mmol/L — AB (ref 135–145)
SODIUM: 107 mmol/L — AB (ref 135–145)
SODIUM: 112 mmol/L — AB (ref 135–145)
SODIUM: 112 mmol/L — AB (ref 135–145)

## 2016-01-23 LAB — CBC
HEMATOCRIT: 35.2 % — AB (ref 36.0–46.0)
HEMATOCRIT: 34.2 % — AB (ref 36.0–46.0)
HEMOGLOBIN: 13.2 g/dL (ref 12.0–15.0)
Hemoglobin: 13.1 g/dL (ref 12.0–15.0)
MCH: 33.2 pg (ref 26.0–34.0)
MCH: 33.3 pg (ref 26.0–34.0)
MCHC: 37.5 g/dL — ABNORMAL HIGH (ref 30.0–36.0)
MCHC: 38.3 g/dL — ABNORMAL HIGH (ref 30.0–36.0)
MCV: 88.4 fL (ref 78.0–100.0)
MCV: 87 fL (ref 78.0–100.0)
Platelets: 149 10*3/uL — ABNORMAL LOW (ref 150–400)
Platelets: 155 10*3/uL (ref 150–400)
RBC: 3.98 MIL/uL (ref 3.87–5.11)
RBC: 3.93 MIL/uL (ref 3.87–5.11)
RDW: 12.3 % (ref 11.5–15.5)
RDW: 12.1 % (ref 11.5–15.5)
WBC: 3.4 10*3/uL — AB (ref 4.0–10.5)
WBC: 4.1 10*3/uL (ref 4.0–10.5)

## 2016-01-23 LAB — I-STAT CHEM 8, ED
BUN: 9 mg/dL (ref 6–20)
CALCIUM ION: 0.9 mmol/L — AB (ref 1.15–1.40)
CHLORIDE: 72 mmol/L — AB (ref 101–111)
Creatinine, Ser: 0.5 mg/dL (ref 0.44–1.00)
GLUCOSE: 155 mg/dL — AB (ref 65–99)
HCT: 42 % (ref 36.0–46.0)
Hemoglobin: 14.3 g/dL (ref 12.0–15.0)
Potassium: 3.5 mmol/L (ref 3.5–5.1)
SODIUM: 108 mmol/L — AB (ref 135–145)
TCO2: 21 mmol/L (ref 0–100)

## 2016-01-23 LAB — RAPID URINE DRUG SCREEN, HOSP PERFORMED
AMPHETAMINES: NOT DETECTED
Barbiturates: NOT DETECTED
Benzodiazepines: NOT DETECTED
Cocaine: NOT DETECTED
Opiates: NOT DETECTED
Tetrahydrocannabinol: NOT DETECTED

## 2016-01-23 LAB — URINALYSIS, ROUTINE W REFLEX MICROSCOPIC
BILIRUBIN URINE: NEGATIVE
Bacteria, UA: NONE SEEN
Glucose, UA: 150 mg/dL — AB
Ketones, ur: 20 mg/dL — AB
LEUKOCYTES UA: NEGATIVE
Nitrite: NEGATIVE
PH: 8 (ref 5.0–8.0)
Protein, ur: NEGATIVE mg/dL
SPECIFIC GRAVITY, URINE: 1.015 (ref 1.005–1.030)

## 2016-01-23 LAB — SODIUM
SODIUM: 111 mmol/L — AB (ref 135–145)
SODIUM: 111 mmol/L — AB (ref 135–145)

## 2016-01-23 LAB — DIFFERENTIAL
Basophils Absolute: 0 10*3/uL (ref 0.0–0.1)
Basophils Relative: 0 %
EOS PCT: 0 %
Eosinophils Absolute: 0 10*3/uL (ref 0.0–0.7)
LYMPHS ABS: 0.6 10*3/uL — AB (ref 0.7–4.0)
LYMPHS PCT: 18 %
MONO ABS: 0.2 10*3/uL (ref 0.1–1.0)
MONOS PCT: 5 %
NEUTROS ABS: 2.6 10*3/uL (ref 1.7–7.7)
Neutrophils Relative %: 76 %

## 2016-01-23 LAB — LIPASE, BLOOD: LIPASE: 21 U/L (ref 11–51)

## 2016-01-23 LAB — I-STAT TROPONIN, ED: Troponin i, poc: 0.03 ng/mL (ref 0.00–0.08)

## 2016-01-23 LAB — MAGNESIUM: MAGNESIUM: 1.4 mg/dL — AB (ref 1.7–2.4)

## 2016-01-23 LAB — PHOSPHORUS: Phosphorus: 2.2 mg/dL — ABNORMAL LOW (ref 2.5–4.6)

## 2016-01-23 LAB — TSH: TSH: 1.505 u[IU]/mL (ref 0.350–4.500)

## 2016-01-23 LAB — OSMOLALITY
OSMOLALITY: 224 mosm/kg — AB (ref 275–295)
OSMOLALITY: 227 mosm/kg — AB (ref 275–295)

## 2016-01-23 LAB — I-STAT CG4 LACTIC ACID, ED: Lactic Acid, Venous: 1.51 mmol/L (ref 0.5–1.9)

## 2016-01-23 LAB — ETHANOL: Alcohol, Ethyl (B): <5 mg/dL (ref <5)

## 2016-01-23 LAB — PROTIME-INR
INR: 1.07
Prothrombin Time: 13.9 seconds (ref 11.4–15.2)

## 2016-01-23 LAB — APTT: aPTT: 32 seconds (ref 24–36)

## 2016-01-23 LAB — SODIUM, URINE, RANDOM: Sodium, Ur: 72 mmol/L

## 2016-01-23 LAB — MRSA PCR SCREENING: MRSA BY PCR: NEGATIVE

## 2016-01-23 LAB — OSMOLALITY, URINE: Osmolality, Ur: 254 mOsm/kg — ABNORMAL LOW (ref 300–900)

## 2016-01-23 MED ORDER — LORAZEPAM 2 MG/ML IJ SOLN: 2.0000 mg | INTRAMUSCULAR | Status: DC

## 2016-01-23 MED ORDER — SODIUM CHLORIDE 3 % IV SOLN
INTRAVENOUS | Status: DC
  Administered 2016-01-23: 20 mL/h via INTRAVENOUS
  Filled 2016-01-23 (×2): qty 500

## 2016-01-23 MED ORDER — HYDROCODONE-ACETAMINOPHEN 5-325 MG PO TABS: 1.0000 | ORAL_TABLET | ORAL | Status: DC | PRN

## 2016-01-23 MED ORDER — ONDANSETRON HCL 4 MG/2ML IJ SOLN
INTRAMUSCULAR | Status: AC
  Filled 2016-01-23: qty 2

## 2016-01-23 MED ORDER — MAGNESIUM SULFATE 2 GM/50ML IV SOLN
2.0000 g | Freq: Once | INTRAVENOUS | Status: AC
  Administered 2016-01-23: 2 g via INTRAVENOUS
  Filled 2016-01-23: qty 50

## 2016-01-23 MED ORDER — IRBESARTAN 75 MG PO TABS
37.5000 mg | ORAL_TABLET | Freq: Every day | ORAL | Status: DC
  Administered 2016-01-24: 37.5 mg via ORAL
  Filled 2016-01-23: qty 0.5

## 2016-01-23 MED ORDER — IOPAMIDOL (ISOVUE-370) INJECTION 76%
INTRAVENOUS | Status: AC
  Administered 2016-01-23: 100 mL via INTRAVENOUS
  Filled 2016-01-23: qty 100

## 2016-01-23 MED ORDER — ATENOLOL 25 MG PO TABS
25.0000 mg | ORAL_TABLET | Freq: Every day | ORAL | Status: DC
  Administered 2016-01-24: 25 mg via ORAL
  Filled 2016-01-23: qty 1

## 2016-01-23 MED ORDER — HEPARIN SODIUM (PORCINE) 5000 UNIT/ML IJ SOLN
5000.0000 [IU] | Freq: Three times a day (TID) | INTRAMUSCULAR | Status: DC
  Administered 2016-01-23 – 2016-01-29 (×16): 5000 [IU] via SUBCUTANEOUS
  Filled 2016-01-23 (×16): qty 1

## 2016-01-23 MED ORDER — SODIUM CHLORIDE 0.9 % IV SOLN
Freq: Once | INTRAVENOUS | Status: AC
  Administered 2016-01-23: 08:00:00 via INTRAVENOUS

## 2016-01-23 MED ORDER — ACETAMINOPHEN 650 MG RE SUPP: 650.0000 mg | Freq: Four times a day (QID) | RECTAL | Status: DC | PRN

## 2016-01-23 MED ORDER — ONDANSETRON HCL 4 MG/2ML IJ SOLN
4.0000 mg | Freq: Four times a day (QID) | INTRAMUSCULAR | Status: DC | PRN
  Administered 2016-01-24: 4 mg via INTRAVENOUS
  Filled 2016-01-23: qty 2

## 2016-01-23 MED ORDER — ACETAMINOPHEN 325 MG PO TABS: 650.0000 mg | ORAL_TABLET | Freq: Four times a day (QID) | ORAL | Status: DC | PRN

## 2016-01-23 MED ORDER — TAMOXIFEN CITRATE 10 MG PO TABS
20.0000 mg | ORAL_TABLET | Freq: Every day | ORAL | Status: DC
  Administered 2016-01-24 – 2016-01-29 (×6): 20 mg via ORAL
  Filled 2016-01-23 (×6): qty 2

## 2016-01-23 MED ORDER — ONDANSETRON HCL 4 MG PO TABS: 4.0000 mg | ORAL_TABLET | Freq: Four times a day (QID) | ORAL | Status: DC | PRN

## 2016-01-23 MED ORDER — POLYETHYLENE GLYCOL 3350 17 G PO PACK: 17.0000 g | PACK | Freq: Every day | ORAL | Status: DC | PRN

## 2016-01-23 MED ORDER — TRAZODONE HCL 50 MG PO TABS
25.0000 mg | ORAL_TABLET | Freq: Every evening | ORAL | Status: DC | PRN
  Administered 2016-01-27: 25 mg via ORAL
  Filled 2016-01-23 (×3): qty 1

## 2016-01-23 MED ORDER — SODIUM CHLORIDE 0.9 % IV SOLN: INTRAVENOUS | Status: DC

## 2016-01-23 NOTE — Consult Note (Addendum)
Neurology Consultation Reason for Consult: Aphasia Referring Physician: Betsey Holiday, C  CC: Aphasia  History is obtained from: Patient  HPI: Cynthia Watkins is a 83 y.o. female who is very independent at baseline who spoke with her son last night and stated that she hadn't been feeling well for a couple of days. Today, she called out for help(lives in [facility] living in [facility]) and when someone responded, they found her aphasic. She was transported by EMS and after arrival here, neurology was contacted given concern for possibly aphasia.  Of note son reports that she has been drinking lots of fluids.   LKW: Last night tpa given?: no, out of window    ROS:  Unable to obtain due to altered mental status.   Past Medical History:  Diagnosis Date  . Acute upper respiratory infections of unspecified site 06/09/2011  . Breast cancer ([place]) 12/11/2008  . Fatigue 01/2012  . Hypercholesterolemia    on lipitor  . Hypertension   . Mitral valve prolapse   . Other abnormal blood chemistry 01/21/2010  . Palpitations 08/05/2000  . Right bundle branch block   . Senile osteoporosis 09/28/2001     Family History  Problem Relation Age of Onset  . Stroke Mother   . Cancer Father     colon  . Alzheimer's disease Sister      Social History:  reports that she quit smoking about 61 years ago. She has never used smokeless tobacco. She reports that she drinks alcohol. She reports that she does not use drugs.   Exam: Current vital signs: BP 163/88   Pulse 75   Temp 97.6 F (36.4 C) (Oral)   Resp 22   Ht 5\' 2"  (1.575 m)   Wt 45.4 kg (100 lb)   SpO2 97%   BMI 18.29 kg/m  Vital signs in last 24 hours: Temp:  [97.6 F (36.4 C)] 97.6 F (36.4 C) (01/17 0605) Pulse Rate:  [65-75] 75 (01/17 0615) Resp:  [19-22] 22 (01/17 0615) BP: (145-163)/(79-88) 163/88 (01/17 0615) SpO2:  [97 %-100 %] 97 % (01/17 0615) Weight:  [45.4 kg (100 lb)] 45.4 kg (100 lb) (01/17 [id number])   Physical Exam   Constitutional: Appears well-developed and well-nourished.  Psych: Affect appropriate to situation Eyes: No scleral injection HENT: No OP obstrucion Head: Normocephalic.  Cardiovascular: Normal rate and regular rhythm.  Respiratory: Effort normal and breath sounds normal to anterior ascultation GI: Soft.  No distension. There is no tenderness.  Skin: WDI  Neuro: Mental Status: Patient is awake, alert, She engages with the examiner, and fixates, but does not follow any commands or have any meaningful verbal output. Cranial Nerves: II: She does not blink to threat from either direction but does fixate. Pupils are equal, round, and reactive to light.  III,IV, VI: She does cross midline in both directions, but does not fully gaze either way. V: Facial sensation is symmetric to temperature VII: Facial movement is symmetric.  VIII: hearing is intact to voice X: Uvula elevates symmetrically XI: Shoulder shrug is symmetric. XII: tongue is midline without atrophy or fasciculations.  Motor: Tone is normal. Bulk is normal. 5/5 strength was present in all four extremities.  Sensory: Sensation is symmetric to light touch and temperature in the arms and legs. Cerebellar: She does not perform  I have reviewed labs in epic and the results pertinent to this consultation are: Sodium 108 Creatinine 0.5  I have reviewed the images obtained: CT head-no acute findings CT perfusion/angio-no clear intervenable  lesion  Impression: 83 year old female with mild hyponatremia in late September who now presents with severe hyponatremia. I suspect that this has been progressive rather than sudden, with worsening due to reported large volume of fluid intake.   Recommendations: 1) I initially recommended CTP/CTA which was completed at the time of finalizing this note. Negative for intervenable lesion.  2) treatment of hyponatremia per internal medicine.  3) No further neurodiagnostic studies unless her  symptoms do not improve with correction of hyponatremia. If she does not improve, then would get MRI brain.  3) Neurology will sign off, please call with further questions or concerns.   Roland Rack, MD [facility] [phone]  If 7pm- 7am, please page neurology on call as listed in [place].

## 2016-01-23 NOTE — ED Notes (Signed)
Normal saline infusion stopped per Nephrology order.

## 2016-01-23 NOTE — Consult Note (Signed)
PULMONARY / CRITICAL CARE MEDICINE   Name: Cynthia Watkins MRN: [id number] DOB: [birth year]    ADMISSION DATE:  01/23/2016 CONSULTATION DATE:  01/23/16  REFERRING MD:  Dr. Doyne Keel   CHIEF COMPLAINT:  AMS/Aphasic   HISTORY OF PRESENT ILLNESS:  83 yo relatively  healthy who lives independently and has recent diarrhea. [place] aged to drink extra fluids by her son(Dr. Capurso) due to fluid loss. 1/17 0500 found confused and transported to [facility] ED where CT and neuro eval were completed and na was 106. PCCM asked to admit and 3% saline will be given. We will admit to ICU and neuro and renal are following.   PAST MEDICAL HISTORY :  She  has a past medical history of Acute upper respiratory infections of unspecified site (06/09/2011); Breast cancer ([place]) (12/11/2008); Fatigue (01/2012); Hypercholesterolemia; Hypertension; Mitral valve prolapse; Other abnormal blood chemistry (01/21/2010); Palpitations (08/05/2000); Right bundle branch block; and Senile osteoporosis (09/28/2001).  PAST SURGICAL HISTORY: She  has a past surgical history that includes Tonsillectomy; Tubal ligation [id number]); Laparoscopic lysis of adhesions (07/15/2006); Other surgical history; and Breast lumpectomy (Right, 12/27/2007).  No Known Allergies  No current facility-administered medications on file prior to encounter.    Current Outpatient Prescriptions on File Prior to Encounter  Medication Sig  . Biotin 5000 MCG CAPS Take 1 capsule by mouth daily.   . tamoxifen (NOLVADEX) 20 MG tablet Take 1 tablet (20 mg total) by mouth daily.  . valsartan (DIOVAN) 40 MG tablet Take 40 mg by mouth daily as needed. If BP increases to [id number] (systolic) or [id number] diastolic    FAMILY HISTORY:  Her indicated that her mother is deceased. She indicated that her father is deceased. She indicated that both of her sisters are alive. She indicated that her maternal grandmother is deceased. She indicated that her maternal grandfather is deceased. She indicated that  her paternal grandmother is deceased. She indicated that her paternal grandfather is deceased. She indicated that her daughter is alive. She indicated that two of her three sons are alive. She indicated that her child is deceased.    SOCIAL HISTORY: She  reports that she quit smoking about 61 years ago. She has never used smokeless tobacco. She reports that she drinks alcohol. She reports that she does not use drugs.  REVIEW OF SYSTEMS:   NA   SUBJECTIVE:  WN female with confusion and aphasia with na 106  VITAL SIGNS: BP 130/65 (BP Location: Right Arm)   Pulse 65   Temp 98.5 F (36.9 C) (Oral)   Resp (!) 28   Ht 5\' 2"  (1.575 m)   Wt 45.4 kg (100 lb)   SpO2 98%   BMI 18.29 kg/m   HEMODYNAMICS:    VENTILATOR SETTINGS:    INTAKE / OUTPUT: No intake/output data recorded.  PHYSICAL EXAMINATION: General: [id number]     Neuro:  Follows commands, mild aphasia. PERL 37mm HEENT:  Oral mucosa dry, no jvd/lan Cardiovascular:  HSR RRR Lungs:  CTA Abdomen:  Soft + bs Musculoskeletal:  intact Skin:  Warm and dry  LABS:  BMET  Recent Labs Lab 01/23/16 0558 01/23/16 0625 01/23/16 0815  NA 107* 108* 106*  K 3.4* 3.5 3.5  CL 73* 72* 73*  CO2 19*  --  20*  BUN 9 9 7   CREATININE 0.55 0.50 0.60  GLUCOSE 154* 155* 153*    Electrolytes  Recent Labs Lab 01/23/16 0558 01/23/16 0815  CALCIUM 7.7* 7.6*  MG  --  1.4*  CBC  Recent Labs Lab 01/23/16 0558 01/23/16 0625  WBC 3.4*  --   HGB 13.2 14.3  HCT 35.2* 42.0  PLT 149*  --     Coag's  Recent Labs Lab 01/23/16 0558  APTT 32  INR 1.07    Sepsis Markers  Recent Labs Lab 01/23/16 0625  LATICACIDVEN 1.51    ABG No results for input(s): PHART, PCO2ART, PO2ART in the last 168 hours.  Liver Enzymes  Recent Labs Lab 01/23/16 0558  AST 31  ALT 18  ALKPHOS 39  BILITOT 1.2  ALBUMIN 3.7    Cardiac Enzymes No results for input(s): TROPONINI, PROBNP in the last 168 hours.  Glucose No results  for input(s): GLUCAP in the last 168 hours.  Imaging Ct Angio Head W Or Wo Contrast  Result Date: 01/23/2016 CLINICAL DATA:  83 year old female with global aphasia. Initial encounter. EXAM: CT ANGIOGRAPHY HEAD AND NECK CT HEAD PERFUSION TECHNIQUE: Multiphase CT imaging of the brain was performed following IV bolus contrast injection. Subsequent parametric perfusion maps were calculated using RAPID software. Multidetector CT imaging of the head and neck was performed using the standard protocol during bolus administration of intravenous contrast. Multiplanar CT image reconstructions and MIPs were obtained to evaluate the vascular anatomy. Carotid stenosis measurements (when applicable) are obtained utilizing NASCET criteria, using the distal internal carotid diameter as the denominator. CONTRAST:  100 mL Isovue 370 COMPARISON:  None. FINDINGS: CT HEAD FINDINGS Brain: No midline shift, ventriculomegaly, mass effect, evidence of mass lesion, intracranial hemorrhage or evidence of cortically based acute infarction. Gray-white matter differentiation is within normal limits throughout the brain. No cortical encephalomalacia identified. ASPECTS 10. Vascular: See below. Skull: No acute osseous abnormality identified. Sinuses: Clear. Orbits: Postoperative changes to both globes. No acute orbit or scalp soft tissue finding. CT BRAIN PERFUSION FINDINGS: CBF (<30%) Volume:  0 mL Perfusion (Tmax>6.0s) volume: 8 mL (in the left cerebellar hemisphere, likely artifact). 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 No acute osseous abnormality identified. Upper chest: Negative lung apices ; mild apical scarring and dependent atelectasis. Other neck: Negative thyroid. Larynx, pharynx, parapharyngeal spaces, retropharyngeal space, sublingual space, submandibular glands and parotid glands are within normal limits. No lymphadenopathy.  Aortic arch: 3 vessel arch configuration. Minimal arch atherosclerosis. No great vessel origin stenosis. Right carotid system: Negative except for tortuosity and minimal right carotid bifurcation atherosclerosis. Left carotid system: Negative except for tortuosity in minimal left carotid bifurcation atherosclerosis. Vertebral arteries:No proximal subclavian artery stenosis. Normal right vertebral artery origin. Tortuous right V1 segment which is in proximity to thyrocervical branches but as expected does not appear to give off any branches. Tortuous right V2 segment. Tortuous right V3 segment. No vertebral artery stenosis to the skullbase. Normal left vertebral artery origin. Tortuous left V1 segment. Tortuous left V2 and V3 segments. No left vertebral artery stenosis to the skullbase. CTA HEAD Posterior circulation: Both vertebral artery V4 segments and the basilar are diminutive. Both PICA origins appear patent. Mild fenestration of the vertebrobasilar junction. Diminutive basilar artery without stenosis. Fetal type bilateral PCA origins. SCA origins are patent. The right PCA branches are normal. There is mild irregularity of the left PCA P2 and P3 segments with preserved distal enhancement. Anterior circulation: Both ICA siphons are patent. Both cavernous segments are tortuous. Mild bilateral siphon calcified plaque without stenosis. Ophthalmic and posterior communicating artery origins are normal. Normal carotid termini, MCA and ACA origin. Anterior communicating artery  and bilateral ACA branches are within normal limits. There is a small median artery of the corpus callosum. Right MCA M1 segment, trifurcation, and right MCA branches are within normal limits. Left MCA M1 segment is normal except for an infundibulum of the lenticulostriate origin (series 605, image 21). The left MCA bifurcation is patent. Left MCA branches appear within normal limits. No left MCA branch occlusion is identified. Venous sinuses:  Insufficient venous contrast on these images. Anatomic variants: Fetal type bilateral PCA origins with diminutive distal vertebral arteries and basilar. Review of the MIP images confirms the above findings IMPRESSION: 1.  Normal noncontrast CT appearance of the brain.  ASPECTS 10. 2. Negative for emergent large vessel occlusion. CT Perfusion data analysis is negative for infarct core or convincing penumbra. 3. The above was discussed by telephone with Dr. Roland Rack on 01/23/2016 at 0648 hours. 4. Tortuous cervical carotid and vertebral arteries with minimal atherosclerosis and no stenosis. 5. Tortuous ICA siphons with minimal atherosclerosis and no stenosis. Otherwise negative anterior circulation; small left MCA M1 infundibulum of the left lenticulostriate artery origin (normal variant). 6. Diminutive vertebrobasilar system on the basis of fetal type PCA origins. Mild atherosclerosis and stenosis of the left PCA (P2 and P3 segments). Otherwise negative posterior circulation. Electronically Signed   By: Genevie Ann M.D.   On: 01/23/2016 07:14   Ct Head Wo Contrast  Result Date: 01/23/2016 CLINICAL DATA:  83 year old female with global aphasia. Initial encounter. EXAM: CT ANGIOGRAPHY HEAD AND NECK CT HEAD PERFUSION TECHNIQUE: Multiphase CT imaging of the brain was performed following IV bolus contrast injection. Subsequent parametric perfusion maps were calculated using RAPID software. Multidetector CT imaging of the head and neck was performed using the standard protocol during bolus administration of intravenous contrast. Multiplanar CT image reconstructions and MIPs were obtained to evaluate the vascular anatomy. Carotid stenosis measurements (when applicable) are obtained utilizing NASCET criteria, using the distal internal carotid diameter as the denominator. CONTRAST:  100 mL Isovue 370 COMPARISON:  None. FINDINGS: CT HEAD FINDINGS Brain: No midline shift, ventriculomegaly, mass effect, evidence of  mass lesion, intracranial hemorrhage or evidence of cortically based acute infarction. Gray-white matter differentiation is within normal limits throughout the brain. No cortical encephalomalacia identified. ASPECTS 10. Vascular: See below. Skull: No acute osseous abnormality identified. Sinuses: Clear. Orbits: Postoperative changes to both globes. No acute orbit or scalp soft tissue finding. CT BRAIN PERFUSION FINDINGS: CBF (<30%) Volume:  0 mL Perfusion (Tmax>6.0s) volume: 8 mL (in the left cerebellar hemisphere, likely artifact). 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 No acute osseous abnormality identified. Upper chest: Negative lung apices ; mild apical scarring and dependent atelectasis. Other neck: Negative thyroid. Larynx, pharynx, parapharyngeal spaces, retropharyngeal space, sublingual space, submandibular glands and parotid glands are within normal limits. No lymphadenopathy. Aortic arch: 3 vessel arch configuration. Minimal arch atherosclerosis. No great vessel origin stenosis. Right carotid system: Negative except for tortuosity and minimal right carotid bifurcation atherosclerosis. Left carotid system: Negative except for tortuosity in minimal left carotid bifurcation atherosclerosis. Vertebral arteries:No proximal subclavian artery stenosis. Normal right vertebral artery origin. Tortuous right V1 segment which is in proximity to thyrocervical branches but as expected does not appear to give off any branches. Tortuous right V2 segment. Tortuous right V3 segment. No vertebral artery stenosis to the skullbase. Normal left vertebral artery origin. Tortuous left V1 segment. Tortuous left V2 and V3 segments. No left vertebral artery stenosis to  the skullbase. CTA HEAD Posterior circulation: Both vertebral artery V4 segments and the basilar are diminutive. Both PICA origins appear patent. Mild fenestration of  the vertebrobasilar junction. Diminutive basilar artery without stenosis. Fetal type bilateral PCA origins. SCA origins are patent. The right PCA branches are normal. There is mild irregularity of the left PCA P2 and P3 segments with preserved distal enhancement. Anterior circulation: Both ICA siphons are patent. Both cavernous segments are tortuous. Mild bilateral siphon calcified plaque without stenosis. Ophthalmic and posterior communicating artery origins are normal. Normal carotid termini, MCA and ACA origin. Anterior communicating artery and bilateral ACA branches are within normal limits. There is a small median artery of the corpus callosum. Right MCA M1 segment, trifurcation, and right MCA branches are within normal limits. Left MCA M1 segment is normal except for an infundibulum of the lenticulostriate origin (series 605, image 21). The left MCA bifurcation is patent. Left MCA branches appear within normal limits. No left MCA branch occlusion is identified. Venous sinuses: Insufficient venous contrast on these images. Anatomic variants: Fetal type bilateral PCA origins with diminutive distal vertebral arteries and basilar. Review of the MIP images confirms the above findings IMPRESSION: 1.  Normal noncontrast CT appearance of the brain.  ASPECTS 10. 2. Negative for emergent large vessel occlusion. CT Perfusion data analysis is negative for infarct core or convincing penumbra. 3. The above was discussed by telephone with Dr. Roland Rack on 01/23/2016 at 0648 hours. 4. Tortuous cervical carotid and vertebral arteries with minimal atherosclerosis and no stenosis. 5. Tortuous ICA siphons with minimal atherosclerosis and no stenosis. Otherwise negative anterior circulation; small left MCA M1 infundibulum of the left lenticulostriate artery origin (normal variant). 6. Diminutive vertebrobasilar system on the basis of fetal type PCA origins. Mild atherosclerosis and stenosis of the left PCA (P2 and P3  segments). Otherwise negative posterior circulation. Electronically Signed   By: Genevie Ann M.D.   On: 01/23/2016 07:14   Ct Angio Neck W Or Wo Contrast  Result Date: 01/23/2016 CLINICAL DATA:  83 year old female with global aphasia. Initial encounter. EXAM: CT ANGIOGRAPHY HEAD AND NECK CT HEAD PERFUSION TECHNIQUE: Multiphase CT imaging of the brain was performed following IV bolus contrast injection. Subsequent parametric perfusion maps were calculated using RAPID software. Multidetector CT imaging of the head and neck was performed using the standard protocol during bolus administration of intravenous contrast. Multiplanar CT image reconstructions and MIPs were obtained to evaluate the vascular anatomy. Carotid stenosis measurements (when applicable) are obtained utilizing NASCET criteria, using the distal internal carotid diameter as the denominator. CONTRAST:  100 mL Isovue 370 COMPARISON:  None. FINDINGS: CT HEAD FINDINGS Brain: No midline shift, ventriculomegaly, mass effect, evidence of mass lesion, intracranial hemorrhage or evidence of cortically based acute infarction. Gray-white matter differentiation is within normal limits throughout the brain. No cortical encephalomalacia identified. ASPECTS 10. Vascular: See below. Skull: No acute osseous abnormality identified. Sinuses: Clear. Orbits: Postoperative changes to both globes. No acute orbit or scalp soft tissue finding. CT BRAIN PERFUSION FINDINGS: CBF (<30%) Volume:  0 mL Perfusion (Tmax>6.0s) volume: 8 mL (in the left cerebellar hemisphere, likely artifact). 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 No acute osseous abnormality identified. Upper chest: Negative lung apices ; mild apical scarring and dependent atelectasis. Other neck: Negative thyroid. Larynx, pharynx, parapharyngeal spaces, retropharyngeal space, sublingual space, submandibular glands and  parotid glands are within normal limits. No lymphadenopathy. Aortic arch: 3 vessel arch  configuration. Minimal arch atherosclerosis. No great vessel origin stenosis. Right carotid system: Negative except for tortuosity and minimal right carotid bifurcation atherosclerosis. Left carotid system: Negative except for tortuosity in minimal left carotid bifurcation atherosclerosis. Vertebral arteries:No proximal subclavian artery stenosis. Normal right vertebral artery origin. Tortuous right V1 segment which is in proximity to thyrocervical branches but as expected does not appear to give off any branches. Tortuous right V2 segment. Tortuous right V3 segment. No vertebral artery stenosis to the skullbase. Normal left vertebral artery origin. Tortuous left V1 segment. Tortuous left V2 and V3 segments. No left vertebral artery stenosis to the skullbase. CTA HEAD Posterior circulation: Both vertebral artery V4 segments and the basilar are diminutive. Both PICA origins appear patent. Mild fenestration of the vertebrobasilar junction. Diminutive basilar artery without stenosis. Fetal type bilateral PCA origins. SCA origins are patent. The right PCA branches are normal. There is mild irregularity of the left PCA P2 and P3 segments with preserved distal enhancement. Anterior circulation: Both ICA siphons are patent. Both cavernous segments are tortuous. Mild bilateral siphon calcified plaque without stenosis. Ophthalmic and posterior communicating artery origins are normal. Normal carotid termini, MCA and ACA origin. Anterior communicating artery and bilateral ACA branches are within normal limits. There is a small median artery of the corpus callosum. Right MCA M1 segment, trifurcation, and right MCA branches are within normal limits. Left MCA M1 segment is normal except for an infundibulum of the lenticulostriate origin (series 605, image 21). The left MCA bifurcation is patent. Left MCA branches appear within normal limits.  No left MCA branch occlusion is identified. Venous sinuses: Insufficient venous contrast on these images. Anatomic variants: Fetal type bilateral PCA origins with diminutive distal vertebral arteries and basilar. Review of the MIP images confirms the above findings IMPRESSION: 1.  Normal noncontrast CT appearance of the brain.  ASPECTS 10. 2. Negative for emergent large vessel occlusion. CT Perfusion data analysis is negative for infarct core or convincing penumbra. 3. The above was discussed by telephone with Dr. Roland Rack on 01/23/2016 at 0648 hours. 4. Tortuous cervical carotid and vertebral arteries with minimal atherosclerosis and no stenosis. 5. Tortuous ICA siphons with minimal atherosclerosis and no stenosis. Otherwise negative anterior circulation; small left MCA M1 infundibulum of the left lenticulostriate artery origin (normal variant). 6. Diminutive vertebrobasilar system on the basis of fetal type PCA origins. Mild atherosclerosis and stenosis of the left PCA (P2 and P3 segments). Otherwise negative posterior circulation. Electronically Signed   By: Genevie Ann M.D.   On: 01/23/2016 07:14   Ct Cerebral Perfusion W Contrast  Result Date: 01/23/2016 CLINICAL DATA:  83 year old female with global aphasia. Initial encounter. EXAM: CT ANGIOGRAPHY HEAD AND NECK CT HEAD PERFUSION TECHNIQUE: Multiphase CT imaging of the brain was performed following IV bolus contrast injection. Subsequent parametric perfusion maps were calculated using RAPID software. Multidetector CT imaging of the head and neck was performed using the standard protocol during bolus administration of intravenous contrast. Multiplanar CT image reconstructions and MIPs were obtained to evaluate the vascular anatomy. Carotid stenosis measurements (when applicable) are obtained utilizing NASCET criteria, using the distal internal carotid diameter as the denominator. CONTRAST:  100 mL Isovue 370 COMPARISON:  None. FINDINGS: CT HEAD  FINDINGS Brain: No midline shift, ventriculomegaly, mass effect, evidence of mass lesion, intracranial hemorrhage or evidence of cortically based acute infarction. Gray-white matter differentiation is within normal limits throughout the brain. No cortical encephalomalacia identified. ASPECTS 10. Vascular: See below. Skull: No acute osseous abnormality identified. Sinuses:  Clear. Orbits: Postoperative changes to both globes. No acute orbit or scalp soft tissue finding. CT BRAIN PERFUSION FINDINGS: CBF (<30%) Volume:  0 mL Perfusion (Tmax>6.0s) volume: 8 mL (in the left cerebellar hemisphere, likely artifact). 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 No acute osseous abnormality identified. Upper chest: Negative lung apices ; mild apical scarring and dependent atelectasis. Other neck: Negative thyroid. Larynx, pharynx, parapharyngeal spaces, retropharyngeal space, sublingual space, submandibular glands and parotid glands are within normal limits. No lymphadenopathy. Aortic arch: 3 vessel arch configuration. Minimal arch atherosclerosis. No great vessel origin stenosis. Right carotid system: Negative except for tortuosity and minimal right carotid bifurcation atherosclerosis. Left carotid system: Negative except for tortuosity in minimal left carotid bifurcation atherosclerosis. Vertebral arteries:No proximal subclavian artery stenosis. Normal right vertebral artery origin. Tortuous right V1 segment which is in proximity to thyrocervical branches but as expected does not appear to give off any branches. Tortuous right V2 segment. Tortuous right V3 segment. No vertebral artery stenosis to the skullbase. Normal left vertebral artery origin. Tortuous left V1 segment. Tortuous left V2 and V3 segments. No left vertebral artery stenosis to the skullbase. CTA HEAD Posterior circulation: Both vertebral artery V4 segments and the  basilar are diminutive. Both PICA origins appear patent. Mild fenestration of the vertebrobasilar junction. Diminutive basilar artery without stenosis. Fetal type bilateral PCA origins. SCA origins are patent. The right PCA branches are normal. There is mild irregularity of the left PCA P2 and P3 segments with preserved distal enhancement. Anterior circulation: Both ICA siphons are patent. Both cavernous segments are tortuous. Mild bilateral siphon calcified plaque without stenosis. Ophthalmic and posterior communicating artery origins are normal. Normal carotid termini, MCA and ACA origin. Anterior communicating artery and bilateral ACA branches are within normal limits. There is a small median artery of the corpus callosum. Right MCA M1 segment, trifurcation, and right MCA branches are within normal limits. Left MCA M1 segment is normal except for an infundibulum of the lenticulostriate origin (series 605, image 21). The left MCA bifurcation is patent. Left MCA branches appear within normal limits. No left MCA branch occlusion is identified. Venous sinuses: Insufficient venous contrast on these images. Anatomic variants: Fetal type bilateral PCA origins with diminutive distal vertebral arteries and basilar. Review of the MIP images confirms the above findings IMPRESSION: 1.  Normal noncontrast CT appearance of the brain.  ASPECTS 10. 2. Negative for emergent large vessel occlusion. CT Perfusion data analysis is negative for infarct core or convincing penumbra. 3. The above was discussed by telephone with Dr. Roland Rack on 01/23/2016 at 0648 hours. 4. Tortuous cervical carotid and vertebral arteries with minimal atherosclerosis and no stenosis. 5. Tortuous ICA siphons with minimal atherosclerosis and no stenosis. Otherwise negative anterior circulation; small left MCA M1 infundibulum of the left lenticulostriate artery origin (normal variant). 6. Diminutive vertebrobasilar system on the basis of fetal type  PCA origins. Mild atherosclerosis and stenosis of the left PCA (P2 and P3 segments). Otherwise negative posterior circulation. Electronically Signed   By: Genevie Ann M.D.   On: 01/23/2016 07:14     STUDIES:  CTA Head/Neck 1/17 > Neg acute   CULTURES: None  ANTIBIOTICS: None  SIGNIFICANT EVENTS: 1/17 > Presents to ED AMS/Hyponaterium   LINES/TUBES:   DISCUSSION: 83 year old female, independent living facility, presents ED on 1/17 with asphasia, CT Head Negative. Found to be hyponatremic with sodium at 106.   ASSESSMENT / PLAN:  PULMONARY A: No issues  P:   Maintain saturation >92  CARDIOVASCULAR A:  H/O HTN, RBBB P:  Cardiac Monitoring   RENAL A:   Hyponatremia secondary to access fluid intake  Hypomag  -Osmolality 224 P:   Trend BMP q2H Restrict Fluid intake  3% Saline at 20 ml hr 2 gm Mag   GASTROINTESTINAL A:   Recent diarrhea P:   PPI  NPO Monitor stools  HEMATOLOGIC A:   No issues  P:  Trend CBC   INFECTIOUS A:   No issues  P:   Trend WBC and Fever Curve   ENDOCRINE A:   Hyperglycemia  P:   q4H glucose checks SSI  NEUROLOGIC A:   Acute Metabolic Encephalopathy secondary to hyponatremia  P:   Treat NA as above  Monitor  Neurology following  RASS goal: 0    FAMILY  - Updates: Son updated at bedside 1/17  - Inter-disciplinary family meet or Palliative Care meeting due by:  1/24   Richardson Landry Yarieliz Wasser ACNP Mary[facility] Shape PCCM Pager [phone] till 3 pm If no answer page [phone] 01/23/2016, 10:48 AM

## 2016-01-23 NOTE — Evaluation (Signed)
Clinical/Bedside Swallow Evaluation Patient Details  Name: Cynthia Watkins MRN: [id number] Date of Birth: 29-Oct-1924  Today's Date: 01/23/2016 Time: SLP Start Time (ACUTE ONLY): 1526 SLP Stop Time (ACUTE ONLY): 1540 SLP Time Calculation (min) (ACUTE ONLY): 14 min  Past Medical History:  Past Medical History:  Diagnosis Date  . Acute upper respiratory infections of unspecified site 06/09/2011  . Breast cancer ([place]) 12/11/2008  . Fatigue 01/2012  . Hypercholesterolemia    on lipitor  . Hypertension   . Mitral valve prolapse   . Other abnormal blood chemistry 01/21/2010  . Palpitations 08/05/2000  . Right bundle branch block   . Senile osteoporosis 09/28/2001   Past Surgical History:  Past Surgical History:  Procedure Laterality Date  . BREAST LUMPECTOMY Right 12/27/2007  . LAPAROSCOPIC LYSIS OF ADHESIONS  07/15/2006   [name]  . OTHER SURGICAL HISTORY    . TONSILLECTOMY    . TUBAL LIGATION  [id number]   HPI:  83 year old female, independent living facility, presents ED on 1/17 with asphasia, CT Head Negative. Found to be hyponatremic with sodium at 106.    Assessment / Plan / Recommendation Clinical Impression  Pt's oropharyngeal swallow appears WFL, with no overt signs of aspiration observed. Her son at the bedside believes that her symptoms are quickly improving. Recommend to initiate regular textures and thin liquids. No further SLP f/u indicated for swallowing.    Aspiration Risk  No limitations    Diet Recommendation Regular;Thin liquid   Liquid Administration via: Cup;Straw Medication Administration: Whole meds with liquid Supervision: Patient able to self feed;Intermittent supervision to cue for compensatory strategies Compensations: Slow rate;Small sips/bites Postural Changes: Seated upright at 90 degrees    Other  Recommendations Oral Care Recommendations: Oral care BID   Follow up Recommendations None      Frequency and Duration            Prognosis         Swallow Study   General HPI: 83 year old female, independent living facility, presents ED on 1/17 with asphasia, CT Head Negative. Found to be hyponatremic with sodium at 106.  Type of Study: Bedside Swallow Evaluation Previous Swallow Assessment: none in chart Diet Prior to this Study: NPO Temperature Spikes Noted: No Respiratory Status: Room air History of Recent Intubation: No Behavior/Cognition: Alert;Cooperative;Pleasant mood Oral Cavity Assessment: Within Functional Limits Oral Care Completed by SLP: No Oral Cavity - Dentition: Adequate natural dentition Vision: Functional for self-feeding Self-Feeding Abilities: Able to feed self Patient Positioning: Upright in bed Baseline Vocal Quality: Normal    Oral/Motor/Sensory Function Overall Oral Motor/Sensory Function: Within functional limits   Ice Chips Ice chips: Not tested   Thin Liquid Thin Liquid: Within functional limits Presentation: Cup;Self Fed;Straw    Nectar Thick Nectar Thick Liquid: Not tested   Honey Thick Honey Thick Liquid: Not tested   Puree Puree: Within functional limits Presentation: Self Fed;Spoon   Solid   GO   Solid: Within functional limits Presentation: Self Ennis Forts 01/23/2016,3:56 PM  Germain Osgood, M.A. CCC-SLP [phone]

## 2016-01-23 NOTE — Progress Notes (Signed)
Critical value Sodium 111, paged Dr. Joelyn Oms @ 18:04. No orders received. Next Sodium check scheduled for 19:00. Will continue to monitor.

## 2016-01-23 NOTE — ED Provider Notes (Signed)
[facility] DEPT Provider Note   CSN: [id number] Arrival date & time: 01/23/16  0551     History   Chief Complaint Chief Complaint  Patient presents with  . Altered Mental Status    HPI Cynthia Watkins is a 83 y.o. female.  Patient presents to the emergency department from assisted living for evaluation of mental status changes and difficulty with speech. Patient is accompanied by her son, who is provided her history and information. Patient reportedly has been sick for the last week or so with diarrhea. He spoke with her last night around 7 PM at which time she seemed a little bit confused, but was speaking normally. Patient apparently rang her Marcell[facility] Deist this morning and staff found her unable to speak. EMS reports that she has been following commands without difficulty but unable to verbalize at all. Level V Caveat due to aphasia.      Past Medical History:  Diagnosis Date  . Acute upper respiratory infections of unspecified site 06/09/2011  . Breast cancer ([place]) 12/11/2008  . Fatigue 01/2012  . Hypercholesterolemia    on lipitor  . Hypertension   . Mitral valve prolapse   . Other abnormal blood chemistry 01/21/2010  . Palpitations 08/05/2000  . Right bundle branch block   . Senile osteoporosis 09/28/2001    Patient Active Problem List   Diagnosis Date Noted  . Palpitations 07/02/2015  . Essential hypertension, benign 02/14/2015  . Lightheadedness 09/18/2014  . Right-sided low back pain with sciatica 02/14/2014  . Bilateral bunions 02/14/2014  . Varicose vein of leg 09/26/2013  . Breast cancer of lower-outer quadrant of right female breast ([place]) 11/24/2012  . Edema 11/12/2012  . Senile osteoporosis 08/09/2012  . Hyperglycemia 08/09/2012  . Decreased exercise tolerance 01/29/2012  . Hypertension   . Hypercholesterolemia   . Right bundle branch block     Past Surgical History:  Procedure Laterality Date  . BREAST LUMPECTOMY Right 12/27/2007  . LAPAROSCOPIC LYSIS  OF ADHESIONS  07/15/2006   [name]  . OTHER SURGICAL HISTORY    . TONSILLECTOMY    . TUBAL LIGATION  1963    OB History    No data available       Home Medications    Prior to Admission medications   Medication Sig Start Date End Date Taking? Authorizing Provider  atenolol (TENORMIN) 25 MG tablet Take 1/2 tablet daily ( 12.5 mg ) 07/23/15   Peter M Martinique, MD  Biotin 5000 MCG CAPS Take by mouth daily.    Historical Provider, MD  Calcium Carbonate-Vit D-Min (CALCIUM 1200) 1200-1000 MG-UNIT CHEW Chew by mouth daily.    Historical Provider, MD  tamoxifen (NOLVADEX) 20 MG tablet Take 1 tablet (20 mg total) by mouth daily. 10/01/15   Chauncey Cruel, MD  valsartan (DIOVAN) 40 MG tablet Take 40 mg by mouth as needed.     Historical Provider, MD    Family History Family History  Problem Relation Age of Onset  . Stroke Mother   . Cancer Father     colon  . Alzheimer's disease Sister     Social History Social History  Substance Use Topics  . Smoking status: Former Smoker    Quit date: 08/06/1954  . Smokeless tobacco: Never Used  . Alcohol use Yes     Comment: Occasionally      Allergies   Patient has no known allergies.   Review of Systems Review of Systems  Unable to perform ROS: Patient nonverbal  Physical Exam Updated Vital Signs BP 145/79 (BP Location: Right Arm)   Pulse 65   Temp 97.6 F (36.4 C) (Oral)   Resp 19   Ht 5\' 2"  (1.575 m)   Wt 100 lb (45.4 kg)   SpO2 100%   BMI 18.29 kg/m   Physical Exam  Constitutional: She appears well-developed and well-nourished. She appears distressed.  HENT:  Head: Normocephalic and atraumatic.  Right Ear: Hearing normal.  Left Ear: Hearing normal.  Nose: Nose normal.  Mouth/Throat: Oropharynx is clear and moist and mucous membranes are normal.  Eyes: Conjunctivae and EOM are normal. Pupils are equal, round, and reactive to light.  Neck: Normal range of motion. Neck supple.  Cardiovascular: Regular rhythm,  S1 normal and S2 normal.  Exam reveals no gallop and no friction rub.   No murmur heard. Pulmonary/Chest: Effort normal and breath sounds normal. No respiratory distress. She exhibits no tenderness.  Abdominal: Soft. Normal appearance and bowel sounds are normal. There is no hepatosplenomegaly. There is no tenderness. There is no rebound, no guarding, no tenderness at McBurney's point and negative Murphy's sign. No hernia.  Musculoskeletal: Normal range of motion.  Neurological: She is alert. She has normal strength. No cranial nerve deficit or sensory deficit. Coordination normal. GCS eye subscore is 4. GCS verbal subscore is 3. GCS motor subscore is 6.  Skin: Skin is warm, dry and intact. No rash noted. No cyanosis.  Psychiatric: She has a normal mood and affect. Her speech is normal and behavior is normal. Thought content normal.  Nursing note and vitals reviewed.    ED Treatments / Results  Labs (all labs ordered are listed, but only abnormal results are displayed) Labs Reviewed  I-STAT CHEM 8, ED - Abnormal; Notable for the following:       Result Value   Sodium 108 (*)    Chloride 72 (*)    Glucose, Bld 155 (*)    Calcium, Ion 0.90 (*)    All other components within normal limits  ETHANOL  PROTIME-INR  APTT  CBC  DIFFERENTIAL  COMPREHENSIVE METABOLIC PANEL  RAPID URINE DRUG SCREEN, HOSP PERFORMED  URINALYSIS, ROUTINE W REFLEX MICROSCOPIC  LIPASE, BLOOD  I-STAT TROPOININ, ED  I-STAT CG4 LACTIC ACID, ED    EKG  EKG Interpretation  Date/Time:  Wednesday January 23 2016 06:05:24 EST Ventricular Rate:  66 PR Interval:    QRS Duration: 140 QT Interval:  452 QTC Calculation: 474 R Axis:   106 Text Interpretation:  Sinus rhythm Right bundle branch block No significant change since last tracing Confirmed by Brynn Mulgrew  MD, Calvin Chura [id number]) on 01/23/2016 6:40:17 AM       Radiology No results found.  Procedures Procedures (including critical care time)  Medications  Ordered in ED Medications  ondansetron (ZOFRAN) 4 MG/2ML injection (not administered)  iopamidol (ISOVUE-370) 76 % injection (not administered)     Initial Impression / Assessment and Plan / ED Course  I have reviewed the triage vital signs and the nursing notes.  Pertinent labs & imaging results that were available during my care of the patient were reviewed by me and considered in my medical decision making (see chart for details).  Clinical Course    Patient brought to the emergency department for evaluation of difficulty with speech. Initial examination was concerning for expressive aphasia. She did not have any other focal neurologic findings. Subacute stroke was considered likely. Last known normal time was 10 or 11 hours prior to arrival, however. Code  stroke was not initiated, but Dr. Leonel Ramsay, neurology was consulted at her arrival. He recommended CT perfusion study to determine if neuro interventional radiology was appropriate. During the time that she was in the CAT scan, lab work returned and showed profound hyponatremia with a sodium of 108. CT has not shown evidence of stroke or perfusion abnormality. Dr. Leonel Ramsay does not recommend any further workup, as the level of hyponatremia she has would explain her current neurologic exam. Patient will require hospitalization for correction of her sodium.   Final Clinical Impressions(s) / ED Diagnoses   Final diagnoses:  Hyponatremia    New Prescriptions New Prescriptions   No medications on file     Orpah Greek, MD 01/23/16 [phone]

## 2016-01-23 NOTE — ED Notes (Signed)
Emptied bedpan that was sitting on counter; patient resting, visitor at bedside; no needs at this time

## 2016-01-23 NOTE — ED Notes (Signed)
Gave report to floor, RN reports not bed in room will call back when bed available.

## 2016-01-23 NOTE — ED Notes (Signed)
Critical care at bedside  

## 2016-01-23 NOTE — Progress Notes (Signed)
CRITICAL VALUE ALERT  Critical value received:  Serum Osmolality 227  Date of notification:  01/23/2016  Time of notification:  16:28  Critical value read back:Yes.    Nurse who received alert:  Hendricks Milo RN  MD notified (1st page):  Dr. Madelon Lips  Time of first page:  16:30  MD notified (2nd page):  Time of second page:  Responding MD: Dr. Hollie Salk  Time MD responded: 14:42  Expected result, no orders received.

## 2016-01-23 NOTE — Progress Notes (Signed)
CRITICAL VALUE ALERT  Critical value received:  Sodium 112  Date of notification:  01/23/2016  Time of notification:  4:56 PM  Critical value read back:Yes.    Nurse who received alert:  B. Kathrin Ruddy RN  MD notified (1st page):  Dr. Hollie Salk  Time of first page:  4:57 PM  MD notified (2nd page):  Time of second page:  Responding MD:  Dr. Hollie Salk  Time MD responded:  16:58  Order received to stop 3% saline at this time and continue q2h Sodium labs as previously ordered.

## 2016-01-23 NOTE — ED Notes (Signed)
This RN called and floor states bed now ready.

## 2016-01-23 NOTE — ED Notes (Signed)
CRITICAL VALUE ALERT  Critical value received:  Sodium 107  Date of notification:  01/23/2016  Time of notification:  0733  Nurse who received alert:  Izora Gala, RN   Time MD responded:  [phone]

## 2016-01-23 NOTE — ED Notes (Signed)
Pt denies pain when asked. Pt unable to find words at other times just stating "lets see".

## 2016-01-23 NOTE — Consult Note (Signed)
[facility] Consult Note     Date: 01/23/2016                  Patient Name:  Cynthia Watkins  MRN: [id number]  DOB: 05-18-1924  Age / Sex: 83 y.o., female         PCP: [name], DO                 Service Requesting Consult: ED / Dr Pollina/ Dr Jeffie Pollock                 Reason for Consult: hyponatremia            Chief Complaint: aphasia  HPI: Pt is an 79F with a PMH significant for HTN, mitral valve prolapse, and a h/o breast cancer s/p lumpectomy who is now seen in consultation at the request of Dr. Betsey Holiday and Dr. Jeffie Pollock for evaluation and recommendations surrounding severe hyponatremia.    Pt lives independently at baseline and completes all her ADLs.  This past week, she's had some diarrhea and hasn't been eating very much.  Her family has been encouraging her to drink plenty of fluids, which she did.    She talked to her son on the phone 2 days ago and seemed normal, yesterday seemed not thinking as quickly, and overnight pushed her call bell.  She was found to be aphasic and was brought to the ED due to concerns of acute stroke.  Stroke workup has been negative and neurohospitalist has been consulted.    She was found to have a serum sodium of 108.  Her last known serum sodium was 130 09/24/15.  She is not on a thiazide diuretic.  Her breast cancer was in 2010 and was cured.  No new meds.  Past Medical History:  Diagnosis Date  . Acute upper respiratory infections of unspecified site 06/09/2011  . Breast cancer ([place]) 12/11/2008  . Fatigue 01/2012  . Hypercholesterolemia    on lipitor  . Hypertension   . Mitral valve prolapse   . Other abnormal blood chemistry 01/21/2010  . Palpitations 08/05/2000  . Right bundle branch block   . Senile osteoporosis 09/28/2001    Past Surgical History:  Procedure Laterality Date  . BREAST LUMPECTOMY Right 12/27/2007  . LAPAROSCOPIC LYSIS OF ADHESIONS  07/15/2006   [name]  . OTHER SURGICAL HISTORY    . TONSILLECTOMY    .  TUBAL LIGATION  1963    Family History  Problem Relation Age of Onset  . Stroke Mother   . Cancer Father     colon  . Alzheimer's disease Sister    Social History:  reports that she quit smoking about 61 years ago. She has never used smokeless tobacco. She reports that she drinks alcohol. She reports that she does not use drugs.  Allergies: No Known Allergies   (Not in a hospital admission)  Results for orders placed or performed during the hospital encounter of 01/23/16 (from the past 48 hour(s))  Ethanol     Status: None   Collection Time: 01/23/16  5:58 AM  Result Value Ref Range   Alcohol, Ethyl (B) <5 <5 mg/dL    Comment:        LOWEST DETECTABLE LIMIT FOR SERUM ALCOHOL IS 5 mg/dL FOR MEDICAL PURPOSES ONLY   Protime-INR     Status: None   Collection Time: 01/23/16  5:58 AM  Result Value Ref Range   Prothrombin Time 13.9 11.4 - 15.2  seconds   INR 1.07   APTT     Status: None   Collection Time: 01/23/16  5:58 AM  Result Value Ref Range   aPTT 32 24 - 36 seconds  Comprehensive metabolic panel     Status: Abnormal   Collection Time: 01/23/16  5:58 AM  Result Value Ref Range   Sodium 107 (LL) 135 - 145 mmol/L    Comment: REPEATED TO VERIFY CRITICAL RESULT CALLED TO, READ BACK BY AND VERIFIED WITH: B.MILLER,RN [id number] 01/23/16 CLARK,S    Potassium 3.4 (L) 3.5 - 5.1 mmol/L   Chloride 73 (L) 101 - 111 mmol/L   CO2 19 (L) 22 - 32 mmol/L   Glucose, Bld 154 (H) 65 - 99 mg/dL   BUN 9 6 - 20 mg/dL   Creatinine, Ser 0.55 0.44 - 1.00 mg/dL   Calcium 7.7 (L) 8.9 - 10.3 mg/dL   Total Protein 6.0 (L) 6.5 - 8.1 g/dL   Albumin 3.7 3.5 - 5.0 g/dL   AST 31 15 - 41 U/L   ALT 18 14 - 54 U/L   Alkaline Phosphatase 39 38 - 126 U/L   Total Bilirubin 1.2 0.3 - 1.2 mg/dL   GFR calc non Af Amer >60 >60 mL/min   GFR calc Af Amer >60 >60 mL/min    Comment: (NOTE) The eGFR has been calculated using the CKD EPI equation. This calculation has not been validated in all clinical  situations. eGFR's persistently <60 mL/min signify possible Chronic Kidney Disease.    Anion gap 15 5 - 15  Lipase, blood     Status: None   Collection Time: 01/23/16  5:58 AM  Result Value Ref Range   Lipase 21 11 - 51 U/L  I-stat troponin, ED (not at [facility], [facility])     Status: None   Collection Time: 01/23/16  6:23 AM  Result Value Ref Range   Troponin i, poc 0.03 0.00 - 0.08 ng/mL   Comment 3            Comment: Due to the release kinetics of cTnI, a negative result within the first hours of the onset of symptoms does not rule out myocardial infarction with certainty. If myocardial infarction is still suspected, repeat the test at appropriate intervals.   I-Stat Chem 8, ED  (not at [facility], [facility])     Status: Abnormal   Collection Time: 01/23/16  6:25 AM  Result Value Ref Range   Sodium 108 (LL) 135 - 145 mmol/L   Potassium 3.5 3.5 - 5.1 mmol/L   Chloride 72 (L) 101 - 111 mmol/L   BUN 9 6 - 20 mg/dL   Creatinine, Ser 0.50 0.44 - 1.00 mg/dL   Glucose, Bld 155 (H) 65 - 99 mg/dL   Calcium, Ion 0.90 (L) 1.15 - 1.40 mmol/L   TCO2 21 0 - 100 mmol/L   Hemoglobin 14.3 12.0 - 15.0 g/dL   HCT 42.0 36.0 - 46.0 %   Comment NOTIFIED PHYSICIAN   I-Stat CG4 Lactic Acid, ED     Status: None   Collection Time: 01/23/16  6:25 AM  Result Value Ref Range   Lactic Acid, Venous 1.51 0.5 - 1.9 mmol/L   Ct Angio Head W Or Wo Contrast  Result Date: 01/23/2016 CLINICAL DATA:  83 year old female with global aphasia. Initial encounter. EXAM: CT ANGIOGRAPHY HEAD AND NECK CT HEAD PERFUSION TECHNIQUE: Multiphase CT imaging of the brain was performed following IV bolus contrast injection. Subsequent parametric perfusion maps were calculated using  RAPID software. Multidetector CT imaging of the head and neck was performed using the standard protocol during bolus administration of intravenous contrast. Multiplanar CT image reconstructions and MIPs were obtained to evaluate the vascular anatomy. Carotid stenosis  measurements (when applicable) are obtained utilizing NASCET criteria, using the distal internal carotid diameter as the denominator. CONTRAST:  100 mL Isovue 370 COMPARISON:  None. FINDINGS: CT HEAD FINDINGS Brain: No midline shift, ventriculomegaly, mass effect, evidence of mass lesion, intracranial hemorrhage or evidence of cortically based acute infarction. Gray-white matter differentiation is within normal limits throughout the brain. No cortical encephalomalacia identified. ASPECTS 10. Vascular: See below. Skull: No acute osseous abnormality identified. Sinuses: Clear. Orbits: Postoperative changes to both globes. No acute orbit or scalp soft tissue finding. CT BRAIN PERFUSION FINDINGS: CBF (<30%) Volume:  0 mL Perfusion (Tmax>6.0s) volume: 8 mL (in the left cerebellar hemisphere, likely artifact). 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 No acute osseous abnormality identified. Upper chest: Negative lung apices ; mild apical scarring and dependent atelectasis. Other neck: Negative thyroid. Larynx, pharynx, parapharyngeal spaces, retropharyngeal space, sublingual space, submandibular glands and parotid glands are within normal limits. No lymphadenopathy. Aortic arch: 3 vessel arch configuration. Minimal arch atherosclerosis. No great vessel origin stenosis. Right carotid system: Negative except for tortuosity and minimal right carotid bifurcation atherosclerosis. Left carotid system: Negative except for tortuosity in minimal left carotid bifurcation atherosclerosis. Vertebral arteries:No proximal subclavian artery stenosis. Normal right vertebral artery origin. Tortuous right V1 segment which is in proximity to thyrocervical branches but as expected does not appear to give off any branches. Tortuous right V2 segment. Tortuous right V3 segment. No vertebral artery stenosis to the skullbase. Normal left vertebral  artery origin. Tortuous left V1 segment. Tortuous left V2 and V3 segments. No left vertebral artery stenosis to the skullbase. CTA HEAD Posterior circulation: Both vertebral artery V4 segments and the basilar are diminutive. Both PICA origins appear patent. Mild fenestration of the vertebrobasilar junction. Diminutive basilar artery without stenosis. Fetal type bilateral PCA origins. SCA origins are patent. The right PCA branches are normal. There is mild irregularity of the left PCA P2 and P3 segments with preserved distal enhancement. Anterior circulation: Both ICA siphons are patent. Both cavernous segments are tortuous. Mild bilateral siphon calcified plaque without stenosis. Ophthalmic and posterior communicating artery origins are normal. Normal carotid termini, MCA and ACA origin. Anterior communicating artery and bilateral ACA branches are within normal limits. There is a small median artery of the corpus callosum. Right MCA M1 segment, trifurcation, and right MCA branches are within normal limits. Left MCA M1 segment is normal except for an infundibulum of the lenticulostriate origin (series 605, image 21). The left MCA bifurcation is patent. Left MCA branches appear within normal limits. No left MCA branch occlusion is identified. Venous sinuses: Insufficient venous contrast on these images. Anatomic variants: Fetal type bilateral PCA origins with diminutive distal vertebral arteries and basilar. Review of the MIP images confirms the above findings IMPRESSION: 1.  Normal noncontrast CT appearance of the brain.  ASPECTS 10. 2. Negative for emergent large vessel occlusion. CT Perfusion data analysis is negative for infarct core or convincing penumbra. 3. The above was discussed by telephone with Dr. Roland Rack on 01/23/2016 at 0648 hours. 4. Tortuous cervical carotid and vertebral arteries with minimal atherosclerosis and no stenosis. 5. Tortuous ICA siphons with minimal atherosclerosis and no  stenosis. Otherwise negative anterior circulation; small left MCA M1  infundibulum of the left lenticulostriate artery origin (normal variant). 6. Diminutive vertebrobasilar system on the basis of fetal type PCA origins. Mild atherosclerosis and stenosis of the left PCA (P2 and P3 segments). Otherwise negative posterior circulation. Electronically Signed   By: Genevie Ann M.D.   On: 01/23/2016 07:14   Ct Head Wo Contrast  Result Date: 01/23/2016 CLINICAL DATA:  83 year old female with global aphasia. Initial encounter. EXAM: CT ANGIOGRAPHY HEAD AND NECK CT HEAD PERFUSION TECHNIQUE: Multiphase CT imaging of the brain was performed following IV bolus contrast injection. Subsequent parametric perfusion maps were calculated using RAPID software. Multidetector CT imaging of the head and neck was performed using the standard protocol during bolus administration of intravenous contrast. Multiplanar CT image reconstructions and MIPs were obtained to evaluate the vascular anatomy. Carotid stenosis measurements (when applicable) are obtained utilizing NASCET criteria, using the distal internal carotid diameter as the denominator. CONTRAST:  100 mL Isovue 370 COMPARISON:  None. FINDINGS: CT HEAD FINDINGS Brain: No midline shift, ventriculomegaly, mass effect, evidence of mass lesion, intracranial hemorrhage or evidence of cortically based acute infarction. Gray-white matter differentiation is within normal limits throughout the brain. No cortical encephalomalacia identified. ASPECTS 10. Vascular: See below. Skull: No acute osseous abnormality identified. Sinuses: Clear. Orbits: Postoperative changes to both globes. No acute orbit or scalp soft tissue finding. CT BRAIN PERFUSION FINDINGS: CBF (<30%) Volume:  0 mL Perfusion (Tmax>6.0s) volume: 8 mL (in the left cerebellar hemisphere, likely artifact). Mismatch Volume:  Not applicable mL Infarction Location:Not applicable CTA NECK Skeleton: Ankylosis of the left C2-C3 posterior  elements. No acute osseous abnormality identified. Upper chest: Negative lung apices ; mild apical scarring and dependent atelectasis. Other neck: Negative thyroid. Larynx, pharynx, parapharyngeal spaces, retropharyngeal space, sublingual space, submandibular glands and parotid glands are within normal limits. No lymphadenopathy. Aortic arch: 3 vessel arch configuration. Minimal arch atherosclerosis. No great vessel origin stenosis. Right carotid system: Negative except for tortuosity and minimal right carotid bifurcation atherosclerosis. Left carotid system: Negative except for tortuosity in minimal left carotid bifurcation atherosclerosis. Vertebral arteries:No proximal subclavian artery stenosis. Normal right vertebral artery origin. Tortuous right V1 segment which is in proximity to thyrocervical branches but as expected does not appear to give off any branches. Tortuous right V2 segment. Tortuous right V3 segment. No vertebral artery stenosis to the skullbase. Normal left vertebral artery origin. Tortuous left V1 segment. Tortuous left V2 and V3 segments. No left vertebral artery stenosis to the skullbase. CTA HEAD Posterior circulation: Both vertebral artery V4 segments and the basilar are diminutive. Both PICA origins appear patent. Mild fenestration of the vertebrobasilar junction. Diminutive basilar artery without stenosis. Fetal type bilateral PCA origins. SCA origins are patent. The right PCA branches are normal. There is mild irregularity of the left PCA P2 and P3 segments with preserved distal enhancement. Anterior circulation: Both ICA siphons are patent. Both cavernous segments are tortuous. Mild bilateral siphon calcified plaque without stenosis. Ophthalmic and posterior communicating artery origins are normal. Normal carotid termini, MCA and ACA origin. Anterior communicating artery and bilateral ACA branches are within normal limits. There is a small median artery of the corpus callosum. Right MCA  M1 segment, trifurcation, and right MCA branches are within normal limits. Left MCA M1 segment is normal except for an infundibulum of the lenticulostriate origin (series 605, image 21). The left MCA bifurcation is patent. Left MCA branches appear within normal limits. No left MCA branch occlusion is identified. Venous sinuses: Insufficient venous contrast on these images. Anatomic variants:  Fetal type bilateral PCA origins with diminutive distal vertebral arteries and basilar. Review of the MIP images confirms the above findings IMPRESSION: 1.  Normal noncontrast CT appearance of the brain.  ASPECTS 10. 2. Negative for emergent large vessel occlusion. CT Perfusion data analysis is negative for infarct core or convincing penumbra. 3. The above was discussed by telephone with Dr. Roland Rack on 01/23/2016 at 0648 hours. 4. Tortuous cervical carotid and vertebral arteries with minimal atherosclerosis and no stenosis. 5. Tortuous ICA siphons with minimal atherosclerosis and no stenosis. Otherwise negative anterior circulation; small left MCA M1 infundibulum of the left lenticulostriate artery origin (normal variant). 6. Diminutive vertebrobasilar system on the basis of fetal type PCA origins. Mild atherosclerosis and stenosis of the left PCA (P2 and P3 segments). Otherwise negative posterior circulation. Electronically Signed   By: Genevie Ann M.D.   On: 01/23/2016 07:14   Ct Angio Neck W Or Wo Contrast  Result Date: 01/23/2016 CLINICAL DATA:  83 year old female with global aphasia. Initial encounter. EXAM: CT ANGIOGRAPHY HEAD AND NECK CT HEAD PERFUSION TECHNIQUE: Multiphase CT imaging of the brain was performed following IV bolus contrast injection. Subsequent parametric perfusion maps were calculated using RAPID software. Multidetector CT imaging of the head and neck was performed using the standard protocol during bolus administration of intravenous contrast. Multiplanar CT image reconstructions and MIPs were  obtained to evaluate the vascular anatomy. Carotid stenosis measurements (when applicable) are obtained utilizing NASCET criteria, using the distal internal carotid diameter as the denominator. CONTRAST:  100 mL Isovue 370 COMPARISON:  None. FINDINGS: CT HEAD FINDINGS Brain: No midline shift, ventriculomegaly, mass effect, evidence of mass lesion, intracranial hemorrhage or evidence of cortically based acute infarction. Gray-white matter differentiation is within normal limits throughout the brain. No cortical encephalomalacia identified. ASPECTS 10. Vascular: See below. Skull: No acute osseous abnormality identified. Sinuses: Clear. Orbits: Postoperative changes to both globes. No acute orbit or scalp soft tissue finding. CT BRAIN PERFUSION FINDINGS: CBF (<30%) Volume:  0 mL Perfusion (Tmax>6.0s) volume: 8 mL (in the left cerebellar hemisphere, likely artifact). 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 No acute osseous abnormality identified. Upper chest: Negative lung apices ; mild apical scarring and dependent atelectasis. Other neck: Negative thyroid. Larynx, pharynx, parapharyngeal spaces, retropharyngeal space, sublingual space, submandibular glands and parotid glands are within normal limits. No lymphadenopathy. Aortic arch: 3 vessel arch configuration. Minimal arch atherosclerosis. No great vessel origin stenosis. Right carotid system: Negative except for tortuosity and minimal right carotid bifurcation atherosclerosis. Left carotid system: Negative except for tortuosity in minimal left carotid bifurcation atherosclerosis. Vertebral arteries:No proximal subclavian artery stenosis. Normal right vertebral artery origin. Tortuous right V1 segment which is in proximity to thyrocervical branches but as expected does not appear to give off any branches. Tortuous right V2 segment. Tortuous right V3 segment. No vertebral  artery stenosis to the skullbase. Normal left vertebral artery origin. Tortuous left V1 segment. Tortuous left V2 and V3 segments. No left vertebral artery stenosis to the skullbase. CTA HEAD Posterior circulation: Both vertebral artery V4 segments and the basilar are diminutive. Both PICA origins appear patent. Mild fenestration of the vertebrobasilar junction. Diminutive basilar artery without stenosis. Fetal type bilateral PCA origins. SCA origins are patent. The right PCA branches are normal. There is mild irregularity of the left PCA P2 and P3 segments with preserved distal enhancement. Anterior circulation: Both ICA siphons are patent. Both cavernous segments are tortuous. Mild  bilateral siphon calcified plaque without stenosis. Ophthalmic and posterior communicating artery origins are normal. Normal carotid termini, MCA and ACA origin. Anterior communicating artery and bilateral ACA branches are within normal limits. There is a small median artery of the corpus callosum. Right MCA M1 segment, trifurcation, and right MCA branches are within normal limits. Left MCA M1 segment is normal except for an infundibulum of the lenticulostriate origin (series 605, image 21). The left MCA bifurcation is patent. Left MCA branches appear within normal limits. No left MCA branch occlusion is identified. Venous sinuses: Insufficient venous contrast on these images. Anatomic variants: Fetal type bilateral PCA origins with diminutive distal vertebral arteries and basilar. Review of the MIP images confirms the above findings IMPRESSION: 1.  Normal noncontrast CT appearance of the brain.  ASPECTS 10. 2. Negative for emergent large vessel occlusion. CT Perfusion data analysis is negative for infarct core or convincing penumbra. 3. The above was discussed by telephone with Dr. Roland Rack on 01/23/2016 at 0648 hours. 4. Tortuous cervical carotid and vertebral arteries with minimal atherosclerosis and no stenosis. 5.  Tortuous ICA siphons with minimal atherosclerosis and no stenosis. Otherwise negative anterior circulation; small left MCA M1 infundibulum of the left lenticulostriate artery origin (normal variant). 6. Diminutive vertebrobasilar system on the basis of fetal type PCA origins. Mild atherosclerosis and stenosis of the left PCA (P2 and P3 segments). Otherwise negative posterior circulation. Electronically Signed   By: Genevie Ann M.D.   On: 01/23/2016 07:14   Ct Cerebral Perfusion W Contrast  Result Date: 01/23/2016 CLINICAL DATA:  83 year old female with global aphasia. Initial encounter. EXAM: CT ANGIOGRAPHY HEAD AND NECK CT HEAD PERFUSION TECHNIQUE: Multiphase CT imaging of the brain was performed following IV bolus contrast injection. Subsequent parametric perfusion maps were calculated using RAPID software. Multidetector CT imaging of the head and neck was performed using the standard protocol during bolus administration of intravenous contrast. Multiplanar CT image reconstructions and MIPs were obtained to evaluate the vascular anatomy. Carotid stenosis measurements (when applicable) are obtained utilizing NASCET criteria, using the distal internal carotid diameter as the denominator. CONTRAST:  100 mL Isovue 370 COMPARISON:  None. FINDINGS: CT HEAD FINDINGS Brain: No midline shift, ventriculomegaly, mass effect, evidence of mass lesion, intracranial hemorrhage or evidence of cortically based acute infarction. Gray-white matter differentiation is within normal limits throughout the brain. No cortical encephalomalacia identified. ASPECTS 10. Vascular: See below. Skull: No acute osseous abnormality identified. Sinuses: Clear. Orbits: Postoperative changes to both globes. No acute orbit or scalp soft tissue finding. CT BRAIN PERFUSION FINDINGS: CBF (<30%) Volume:  0 mL Perfusion (Tmax>6.0s) volume: 8 mL (in the left cerebellar hemisphere, likely artifact). Mismatch Volume:  Not applicable mL Infarction Location:Not  applicable CTA NECK Skeleton: Ankylosis of the left C2-C3 posterior elements. No acute osseous abnormality identified. Upper chest: Negative lung apices ; mild apical scarring and dependent atelectasis. Other neck: Negative thyroid. Larynx, pharynx, parapharyngeal spaces, retropharyngeal space, sublingual space, submandibular glands and parotid glands are within normal limits. No lymphadenopathy. Aortic arch: 3 vessel arch configuration. Minimal arch atherosclerosis. No great vessel origin stenosis. Right carotid system: Negative except for tortuosity and minimal right carotid bifurcation atherosclerosis. Left carotid system: Negative except for tortuosity in minimal left carotid bifurcation atherosclerosis. Vertebral arteries:No proximal subclavian artery stenosis. Normal right vertebral artery origin. Tortuous right V1 segment which is in proximity to thyrocervical branches but as expected does not appear to give off any branches. Tortuous right V2 segment. Tortuous right V3 segment. No vertebral artery  stenosis to the skullbase. Normal left vertebral artery origin. Tortuous left V1 segment. Tortuous left V2 and V3 segments. No left vertebral artery stenosis to the skullbase. CTA HEAD Posterior circulation: Both vertebral artery V4 segments and the basilar are diminutive. Both PICA origins appear patent. Mild fenestration of the vertebrobasilar junction. Diminutive basilar artery without stenosis. Fetal type bilateral PCA origins. SCA origins are patent. The right PCA branches are normal. There is mild irregularity of the left PCA P2 and P3 segments with preserved distal enhancement. Anterior circulation: Both ICA siphons are patent. Both cavernous segments are tortuous. Mild bilateral siphon calcified plaque without stenosis. Ophthalmic and posterior communicating artery origins are normal. Normal carotid termini, MCA and ACA origin. Anterior communicating artery and bilateral ACA branches are within normal  limits. There is a small median artery of the corpus callosum. Right MCA M1 segment, trifurcation, and right MCA branches are within normal limits. Left MCA M1 segment is normal except for an infundibulum of the lenticulostriate origin (series 605, image 21). The left MCA bifurcation is patent. Left MCA branches appear within normal limits. No left MCA branch occlusion is identified. Venous sinuses: Insufficient venous contrast on these images. Anatomic variants: Fetal type bilateral PCA origins with diminutive distal vertebral arteries and basilar. Review of the MIP images confirms the above findings IMPRESSION: 1.  Normal noncontrast CT appearance of the brain.  ASPECTS 10. 2. Negative for emergent large vessel occlusion. CT Perfusion data analysis is negative for infarct core or convincing penumbra. 3. The above was discussed by telephone with Dr. Roland Rack on 01/23/2016 at 0648 hours. 4. Tortuous cervical carotid and vertebral arteries with minimal atherosclerosis and no stenosis. 5. Tortuous ICA siphons with minimal atherosclerosis and no stenosis. Otherwise negative anterior circulation; small left MCA M1 infundibulum of the left lenticulostriate artery origin (normal variant). 6. Diminutive vertebrobasilar system on the basis of fetal type PCA origins. Mild atherosclerosis and stenosis of the left PCA (P2 and P3 segments). Otherwise negative posterior circulation. Electronically Signed   By: Genevie Ann M.D.   On: 01/23/2016 07:14    ROS: unobtainable due to mental status changes  Blood pressure 131/96, pulse 64, temperature 97.6 F (36.4 C), temperature source Oral, resp. rate (!) 28, height _0  (1.575 m), weight 45.4 kg (100 lb), SpO2 98 %. Physical Exam  GEN: elderly woman, lying in bed, awake but not able to speak or interact, picking at bedclothes HEENT: EOMI, PERRL, MMM NECK: no carotid bruits PULM: normal WOB, clear bilaterally, coarse bilateral breath sounds which resolve with  repeated breaths CV RRR no m/r/g ABD soft, thin, + small ventral hernia, do not appreciate bladder EXT trace LE edema NEURO awake and alert, cannot speak, cannot consistently follow commands   Assessment/Plan  1.  Hyponatremia: pt presenting with severe hyponatremia and associated neurologic deficits.  Stroke workup negative. Last known serum sodium 130 in 09/24/2015.  In this setting, recommend hypertonic saline to raise serum sodium and to lessen the seizure risk.  I am adding on serum osms, urine osms, and a urine sodium to ascertain hypovolemic hyponatremia vs SIADH.  She has received an unknown quantity of normal saline @ 75 mL/ hr which is now stopped.  For hypertonic saline administration she requires ICU monitoring and q 2 hr serum sodiums.   - recommend ICU status admission - recommend q2 serum sodiums- since she has already received some NS recommend checking a serum sodium upon ICU admission and then deciding rate of hypertonic saline administration (  would probably not do more than 20 mL/ hr) - will follow up urine and serum osms/ sodium studies- these have been collected before fluid administration - recommend no more than 6 mmol/L rate of correction in 24 hours as we should assume this is a chronic problem (aka occurring for more than 24 hours)   Madelon Lips, MD 01/23/2016, 7:57 AM

## 2016-01-23 NOTE — Progress Notes (Signed)
EEG Completed; Results Pending  

## 2016-01-23 NOTE — ED Notes (Signed)
Admitting team aware of critical low sodium of 106.

## 2016-01-23 NOTE — ED Notes (Signed)
Family at bedside. 

## 2016-01-23 NOTE — H&P (Signed)
History and Physical    Cynthia Watkins [id number] DOB: Jul 17, 1924 DOA: 01/23/2016  PCP: [name], DO   Patient coming from: [facility]  Chief Complaint: AMS  HPI: Cynthia Watkins is a 83 y.o. female with medical history significant of hypertension, breast carcinoma, senile osteoporosis and hyperglycemia who presented to the emergency department with altered mental status clear this morning. She is a mother of one of the practicing urologist in [place], Dr. Jeffie Pollock who was at bedside and provided medical history. As it turned out during the last few days patient has been dealing with diarrhea and lies to 2 drink lots of fluids and she was doing good diligently drinking lots of water and low sodium V8 juice. Last she spoke with his son and mentioned that there is something with her head, but was able to carry conversation and had no specific complaints. At this morning patient activated the alarm resident's, but staff reported that within the answer the call patient was unable to say anything due to aphasia ED Course: Upon arrival to the emergency room her vital signs were stable, neurology consult was requested, patient underwent CT of the head and  CT angiogram of the neck and brain. . The studies were negative for intervening of the lesion. Blood work showed profound hyponatremia with sodium level of 108, mild hypokalemia.     Review of Systems: As per HPI otherwise 10 point review of systems negative.   Ambulatory Status: Independent  Past Medical History:  Diagnosis Date  . Acute upper respiratory infections of unspecified site 06/09/2011  . Breast cancer ([place]) 12/11/2008  . Fatigue 01/2012  . Hypercholesterolemia    on lipitor  . Hypertension   . Mitral valve prolapse   . Other abnormal blood chemistry 01/21/2010  . Palpitations 08/05/2000  . Right bundle branch block   . Senile osteoporosis 09/28/2001    Past Surgical History:  Procedure Laterality  Date  . BREAST LUMPECTOMY Right 12/27/2007  . LAPAROSCOPIC LYSIS OF ADHESIONS  07/15/2006   [name]  . OTHER SURGICAL HISTORY    . TONSILLECTOMY    . TUBAL LIGATION  1963    Social History   Social History  . Marital status: Married    Spouse name: N/A  . Number of children: 3  . Years of education: N/A   Occupational History  . Retired Pharmacist, hospital Retired   Social History Main Topics  . Smoking status: Former Smoker    Quit date: 08/06/1954  . Smokeless tobacco: Never Used  . Alcohol use Yes     Comment: Occasionally   . Drug use: No  . Sexual activity: No   Other Topics Concern  . Not on file   Social History Narrative   Lives at [facility] since 05/1998   Widowed   Living will   Former smoker, stopped 1956   Alcohol  Rare   Exercise - walk one hour daily, 3 days of stretching and strengthen          No Known Allergies  Family History  Problem Relation Age of Onset  . Stroke Mother   . Cancer Father     colon  . Alzheimer's disease Sister     Prior to Admission medications   Medication Sig Start Date End Date Taking? Authorizing Provider  atenolol (TENORMIN) 25 MG tablet Take 1/2 tablet daily ( 12.5 mg ) 07/23/15   Peter M Martinique, MD  Biotin 5000 MCG CAPS Take by mouth daily.  Historical Provider, MD  Calcium Carbonate-Vit D-Min (CALCIUM 1200) 1200-1000 MG-UNIT CHEW Chew by mouth daily.    Historical Provider, MD  tamoxifen (NOLVADEX) 20 MG tablet Take 1 tablet (20 mg total) by mouth daily. 10/01/15   Chauncey Cruel, MD  valsartan (DIOVAN) 40 MG tablet Take 40 mg by mouth as needed.     Historical Provider, MD    Physical Exam: Vitals:   01/23/16 [id number] 01/23/16 0615 01/23/16 0730 01/23/16 0801  BP:  163/88 131/96 143/73  Pulse:  75 64 65  Resp:  22 (!) 28 (!) 27  Temp:    97.5 F (36.4 C)  TempSrc:    Oral  SpO2:  97% 98% 98%  Weight: 45.4 kg (100 lb)     Height: 5\' 2"  (1.575 m)        General: Appears calm and comfortable Eyes: PERRLA, EOMI,  normal lids, iris ENT:  grossly normal hearing, lips & tongue, mucous membranes moist and intact Neck: no lymphoadenopathy, masses or thyromegaly Cardiovascular: RRR, no m/r/g. No JVD, carotid bruits. No LE edema.  Respiratory: bilateral no wheezes, rales, rhonchi or cracles. Normal respiratory effort. No accessory muscle use observed Abdomen: soft, non-tender, non-distended, no organomegaly or masses appreciated. BS present in all quadrants Skin: no rash, ulcers or induration seen on limited exam Musculoskeletal: grossly normal tone BUE/BLE, good ROM, no bony abnormality or joint deformities observed Psychiatric: patient only able to answer yes or no to simple questions, alert and oriented self and her son       Neurologic: unable to perform as patient does not follow commands     Labs on Admission: I have personally reviewed following labs and imaging studies  CBC, BMP  GFR: Estimated Creatinine Clearance: 34.2 mL/min (by C-G formula based on SCr of 0.5 mg/dL).   Creatinine Clearance: Estimated Creatinine Clearance: 34.2 mL/min (by C-G formula based on SCr of 0.5 mg/dL).    Radiological Exams on Admission: Ct Angio Head W Or Wo Contrast  Result Date: 01/23/2016 CLINICAL DATA:  83 year old female with global aphasia. Initial encounter. EXAM: CT ANGIOGRAPHY HEAD AND NECK CT HEAD PERFUSION TECHNIQUE: Multiphase CT imaging of the brain was performed following IV bolus contrast injection. Subsequent parametric perfusion maps were calculated using RAPID software. Multidetector CT imaging of the head and neck was performed using the standard protocol during bolus administration of intravenous contrast. Multiplanar CT image reconstructions and MIPs were obtained to evaluate the vascular anatomy. Carotid stenosis measurements (when applicable) are obtained utilizing NASCET criteria, using the distal internal carotid diameter as the denominator. CONTRAST:  100 mL Isovue 370 COMPARISON:  None.  FINDINGS: CT HEAD FINDINGS Brain: No midline shift, ventriculomegaly, mass effect, evidence of mass lesion, intracranial hemorrhage or evidence of cortically based acute infarction. Gray-white matter differentiation is within normal limits throughout the brain. No cortical encephalomalacia identified. ASPECTS 10. Vascular: See below. Skull: No acute osseous abnormality identified. Sinuses: Clear. Orbits: Postoperative changes to both globes. No acute orbit or scalp soft tissue finding. CT BRAIN PERFUSION FINDINGS: CBF (<30%) Volume:  0 mL Perfusion (Tmax>6.0s) volume: 8 mL (in the left cerebellar hemisphere, likely artifact). 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 No acute osseous abnormality identified. Upper chest: Negative lung apices ; mild apical scarring and dependent atelectasis. Other neck: Negative thyroid. Larynx, pharynx, parapharyngeal spaces, retropharyngeal space, sublingual space, submandibular glands and parotid glands are within normal limits. No lymphadenopathy. Aortic arch: 3 vessel  arch configuration. Minimal arch atherosclerosis. No great vessel origin stenosis. Right carotid system: Negative except for tortuosity and minimal right carotid bifurcation atherosclerosis. Left carotid system: Negative except for tortuosity in minimal left carotid bifurcation atherosclerosis. Vertebral arteries:No proximal subclavian artery stenosis. Normal right vertebral artery origin. Tortuous right V1 segment which is in proximity to thyrocervical branches but as expected does not appear to give off any branches. Tortuous right V2 segment. Tortuous right V3 segment. No vertebral artery stenosis to the skullbase. Normal left vertebral artery origin. Tortuous left V1 segment. Tortuous left V2 and V3 segments. No left vertebral artery stenosis to the skullbase. CTA HEAD Posterior circulation: Both vertebral artery V4  segments and the basilar are diminutive. Both PICA origins appear patent. Mild fenestration of the vertebrobasilar junction. Diminutive basilar artery without stenosis. Fetal type bilateral PCA origins. SCA origins are patent. The right PCA branches are normal. There is mild irregularity of the left PCA P2 and P3 segments with preserved distal enhancement. Anterior circulation: Both ICA siphons are patent. Both cavernous segments are tortuous. Mild bilateral siphon calcified plaque without stenosis. Ophthalmic and posterior communicating artery origins are normal. Normal carotid termini, MCA and ACA origin. Anterior communicating artery and bilateral ACA branches are within normal limits. There is a small median artery of the corpus callosum. Right MCA M1 segment, trifurcation, and right MCA branches are within normal limits. Left MCA M1 segment is normal except for an infundibulum of the lenticulostriate origin (series 605, image 21). The left MCA bifurcation is patent. Left MCA branches appear within normal limits. No left MCA branch occlusion is identified. Venous sinuses: Insufficient venous contrast on these images. Anatomic variants: Fetal type bilateral PCA origins with diminutive distal vertebral arteries and basilar. Review of the MIP images confirms the above findings IMPRESSION: 1.  Normal noncontrast CT appearance of the brain.  ASPECTS 10. 2. Negative for emergent large vessel occlusion. CT Perfusion data analysis is negative for infarct core or convincing penumbra. 3. The above was discussed by telephone with Dr. Roland Rack on 01/23/2016 at 0648 hours. 4. Tortuous cervical carotid and vertebral arteries with minimal atherosclerosis and no stenosis. 5. Tortuous ICA siphons with minimal atherosclerosis and no stenosis. Otherwise negative anterior circulation; small left MCA M1 infundibulum of the left lenticulostriate artery origin (normal variant). 6. Diminutive vertebrobasilar system on the  basis of fetal type PCA origins. Mild atherosclerosis and stenosis of the left PCA (P2 and P3 segments). Otherwise negative posterior circulation. Electronically Signed   By: Genevie Ann M.D.   On: 01/23/2016 07:14   Ct Head Wo Contrast  Result Date: 01/23/2016 CLINICAL DATA:  83 year old female with global aphasia. Initial encounter. EXAM: CT ANGIOGRAPHY HEAD AND NECK CT HEAD PERFUSION TECHNIQUE: Multiphase CT imaging of the brain was performed following IV bolus contrast injection. Subsequent parametric perfusion maps were calculated using RAPID software. Multidetector CT imaging of the head and neck was performed using the standard protocol during bolus administration of intravenous contrast. Multiplanar CT image reconstructions and MIPs were obtained to evaluate the vascular anatomy. Carotid stenosis measurements (when applicable) are obtained utilizing NASCET criteria, using the distal internal carotid diameter as the denominator. CONTRAST:  100 mL Isovue 370 COMPARISON:  None. FINDINGS: CT HEAD FINDINGS Brain: No midline shift, ventriculomegaly, mass effect, evidence of mass lesion, intracranial hemorrhage or evidence of cortically based acute infarction. Gray-white matter differentiation is within normal limits throughout the brain. No cortical encephalomalacia identified. ASPECTS 10. Vascular: See below. Skull: No acute osseous abnormality identified. Sinuses:  Clear. Orbits: Postoperative changes to both globes. No acute orbit or scalp soft tissue finding. CT BRAIN PERFUSION FINDINGS: CBF (<30%) Volume:  0 mL Perfusion (Tmax>6.0s) volume: 8 mL (in the left cerebellar hemisphere, likely artifact). 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 No acute osseous abnormality identified. Upper chest: Negative lung apices ; mild apical scarring and dependent atelectasis. Other neck: Negative thyroid. Larynx, pharynx,  parapharyngeal spaces, retropharyngeal space, sublingual space, submandibular glands and parotid glands are within normal limits. No lymphadenopathy. Aortic arch: 3 vessel arch configuration. Minimal arch atherosclerosis. No great vessel origin stenosis. Right carotid system: Negative except for tortuosity and minimal right carotid bifurcation atherosclerosis. Left carotid system: Negative except for tortuosity in minimal left carotid bifurcation atherosclerosis. Vertebral arteries:No proximal subclavian artery stenosis. Normal right vertebral artery origin. Tortuous right V1 segment which is in proximity to thyrocervical branches but as expected does not appear to give off any branches. Tortuous right V2 segment. Tortuous right V3 segment. No vertebral artery stenosis to the skullbase. Normal left vertebral artery origin. Tortuous left V1 segment. Tortuous left V2 and V3 segments. No left vertebral artery stenosis to the skullbase. CTA HEAD Posterior circulation: Both vertebral artery V4 segments and the basilar are diminutive. Both PICA origins appear patent. Mild fenestration of the vertebrobasilar junction. Diminutive basilar artery without stenosis. Fetal type bilateral PCA origins. SCA origins are patent. The right PCA branches are normal. There is mild irregularity of the left PCA P2 and P3 segments with preserved distal enhancement. Anterior circulation: Both ICA siphons are patent. Both cavernous segments are tortuous. Mild bilateral siphon calcified plaque without stenosis. Ophthalmic and posterior communicating artery origins are normal. Normal carotid termini, MCA and ACA origin. Anterior communicating artery and bilateral ACA branches are within normal limits. There is a small median artery of the corpus callosum. Right MCA M1 segment, trifurcation, and right MCA branches are within normal limits. Left MCA M1 segment is normal except for an infundibulum of the lenticulostriate origin (series 605, image  21). The left MCA bifurcation is patent. Left MCA branches appear within normal limits. No left MCA branch occlusion is identified. Venous sinuses: Insufficient venous contrast on these images. Anatomic variants: Fetal type bilateral PCA origins with diminutive distal vertebral arteries and basilar. Review of the MIP images confirms the above findings IMPRESSION: 1.  Normal noncontrast CT appearance of the brain.  ASPECTS 10. 2. Negative for emergent large vessel occlusion. CT Perfusion data analysis is negative for infarct core or convincing penumbra. 3. The above was discussed by telephone with Dr. Roland Rack on 01/23/2016 at 0648 hours. 4. Tortuous cervical carotid and vertebral arteries with minimal atherosclerosis and no stenosis. 5. Tortuous ICA siphons with minimal atherosclerosis and no stenosis. Otherwise negative anterior circulation; small left MCA M1 infundibulum of the left lenticulostriate artery origin (normal variant). 6. Diminutive vertebrobasilar system on the basis of fetal type PCA origins. Mild atherosclerosis and stenosis of the left PCA (P2 and P3 segments). Otherwise negative posterior circulation. Electronically Signed   By: Genevie Ann M.D.   On: 01/23/2016 07:14   Ct Angio Neck W Or Wo Contrast  Result Date: 01/23/2016 CLINICAL DATA:  83 year old female with global aphasia. Initial encounter. EXAM: CT ANGIOGRAPHY HEAD AND NECK CT HEAD PERFUSION TECHNIQUE: Multiphase CT imaging of the brain was performed following IV bolus contrast injection. Subsequent parametric perfusion maps were calculated using RAPID software. Multidetector CT imaging of the head and neck  was performed using the standard protocol during bolus administration of intravenous contrast. Multiplanar CT image reconstructions and MIPs were obtained to evaluate the vascular anatomy. Carotid stenosis measurements (when applicable) are obtained utilizing NASCET criteria, using the distal internal carotid diameter as the  denominator. CONTRAST:  100 mL Isovue 370 COMPARISON:  None. FINDINGS: CT HEAD FINDINGS Brain: No midline shift, ventriculomegaly, mass effect, evidence of mass lesion, intracranial hemorrhage or evidence of cortically based acute infarction. Gray-white matter differentiation is within normal limits throughout the brain. No cortical encephalomalacia identified. ASPECTS 10. Vascular: See below. Skull: No acute osseous abnormality identified. Sinuses: Clear. Orbits: Postoperative changes to both globes. No acute orbit or scalp soft tissue finding. CT BRAIN PERFUSION FINDINGS: CBF (<30%) Volume:  0 mL Perfusion (Tmax>6.0s) volume: 8 mL (in the left cerebellar hemisphere, likely artifact). 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 No acute osseous abnormality identified. Upper chest: Negative lung apices ; mild apical scarring and dependent atelectasis. Other neck: Negative thyroid. Larynx, pharynx, parapharyngeal spaces, retropharyngeal space, sublingual space, submandibular glands and parotid glands are within normal limits. No lymphadenopathy. Aortic arch: 3 vessel arch configuration. Minimal arch atherosclerosis. No great vessel origin stenosis. Right carotid system: Negative except for tortuosity and minimal right carotid bifurcation atherosclerosis. Left carotid system: Negative except for tortuosity in minimal left carotid bifurcation atherosclerosis. Vertebral arteries:No proximal subclavian artery stenosis. Normal right vertebral artery origin. Tortuous right V1 segment which is in proximity to thyrocervical branches but as expected does not appear to give off any branches. Tortuous right V2 segment. Tortuous right V3 segment. No vertebral artery stenosis to the skullbase. Normal left vertebral artery origin. Tortuous left V1 segment. Tortuous left V2 and V3 segments. No left vertebral artery stenosis to the  skullbase. CTA HEAD Posterior circulation: Both vertebral artery V4 segments and the basilar are diminutive. Both PICA origins appear patent. Mild fenestration of the vertebrobasilar junction. Diminutive basilar artery without stenosis. Fetal type bilateral PCA origins. SCA origins are patent. The right PCA branches are normal. There is mild irregularity of the left PCA P2 and P3 segments with preserved distal enhancement. Anterior circulation: Both ICA siphons are patent. Both cavernous segments are tortuous. Mild bilateral siphon calcified plaque without stenosis. Ophthalmic and posterior communicating artery origins are normal. Normal carotid termini, MCA and ACA origin. Anterior communicating artery and bilateral ACA branches are within normal limits. There is a small median artery of the corpus callosum. Right MCA M1 segment, trifurcation, and right MCA branches are within normal limits. Left MCA M1 segment is normal except for an infundibulum of the lenticulostriate origin (series 605, image 21). The left MCA bifurcation is patent. Left MCA branches appear within normal limits. No left MCA branch occlusion is identified. Venous sinuses: Insufficient venous contrast on these images. Anatomic variants: Fetal type bilateral PCA origins with diminutive distal vertebral arteries and basilar. Review of the MIP images confirms the above findings IMPRESSION: 1.  Normal noncontrast CT appearance of the brain.  ASPECTS 10. 2. Negative for emergent large vessel occlusion. CT Perfusion data analysis is negative for infarct core or convincing penumbra. 3. The above was discussed by telephone with Dr. Roland Rack on 01/23/2016 at 0648 hours. 4. Tortuous cervical carotid and vertebral arteries with minimal atherosclerosis and no stenosis. 5. Tortuous ICA siphons with minimal atherosclerosis and no stenosis. Otherwise negative anterior circulation; small left MCA M1 infundibulum of the left lenticulostriate artery  origin (normal variant). 6.  Diminutive vertebrobasilar system on the basis of fetal type PCA origins. Mild atherosclerosis and stenosis of the left PCA (P2 and P3 segments). Otherwise negative posterior circulation. Electronically Signed   By: Genevie Ann M.D.   On: 01/23/2016 07:14   Ct Cerebral Perfusion W Contrast  Result Date: 01/23/2016 CLINICAL DATA:  83 year old female with global aphasia. Initial encounter. EXAM: CT ANGIOGRAPHY HEAD AND NECK CT HEAD PERFUSION TECHNIQUE: Multiphase CT imaging of the brain was performed following IV bolus contrast injection. Subsequent parametric perfusion maps were calculated using RAPID software. Multidetector CT imaging of the head and neck was performed using the standard protocol during bolus administration of intravenous contrast. Multiplanar CT image reconstructions and MIPs were obtained to evaluate the vascular anatomy. Carotid stenosis measurements (when applicable) are obtained utilizing NASCET criteria, using the distal internal carotid diameter as the denominator. CONTRAST:  100 mL Isovue 370 COMPARISON:  None. FINDINGS: CT HEAD FINDINGS Brain: No midline shift, ventriculomegaly, mass effect, evidence of mass lesion, intracranial hemorrhage or evidence of cortically based acute infarction. Gray-white matter differentiation is within normal limits throughout the brain. No cortical encephalomalacia identified. ASPECTS 10. Vascular: See below. Skull: No acute osseous abnormality identified. Sinuses: Clear. Orbits: Postoperative changes to both globes. No acute orbit or scalp soft tissue finding. CT BRAIN PERFUSION FINDINGS: CBF (<30%) Volume:  0 mL Perfusion (Tmax>6.0s) volume: 8 mL (in the left cerebellar hemisphere, likely artifact). 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 No acute osseous abnormality identified. Upper chest: Negative lung apices ; mild apical scarring  and dependent atelectasis. Other neck: Negative thyroid. Larynx, pharynx, parapharyngeal spaces, retropharyngeal space, sublingual space, submandibular glands and parotid glands are within normal limits. No lymphadenopathy. Aortic arch: 3 vessel arch configuration. Minimal arch atherosclerosis. No great vessel origin stenosis. Right carotid system: Negative except for tortuosity and minimal right carotid bifurcation atherosclerosis. Left carotid system: Negative except for tortuosity in minimal left carotid bifurcation atherosclerosis. Vertebral arteries:No proximal subclavian artery stenosis. Normal right vertebral artery origin. Tortuous right V1 segment which is in proximity to thyrocervical branches but as expected does not appear to give off any branches. Tortuous right V2 segment. Tortuous right V3 segment. No vertebral artery stenosis to the skullbase. Normal left vertebral artery origin. Tortuous left V1 segment. Tortuous left V2 and V3 segments. No left vertebral artery stenosis to the skullbase. CTA HEAD Posterior circulation: Both vertebral artery V4 segments and the basilar are diminutive. Both PICA origins appear patent. Mild fenestration of the vertebrobasilar junction. Diminutive basilar artery without stenosis. Fetal type bilateral PCA origins. SCA origins are patent. The right PCA branches are normal. There is mild irregularity of the left PCA P2 and P3 segments with preserved distal enhancement. Anterior circulation: Both ICA siphons are patent. Both cavernous segments are tortuous. Mild bilateral siphon calcified plaque without stenosis. Ophthalmic and posterior communicating artery origins are normal. Normal carotid termini, MCA and ACA origin. Anterior communicating artery and bilateral ACA branches are within normal limits. There is a small median artery of the corpus callosum. Right MCA M1 segment, trifurcation, and right MCA branches are within normal limits. Left MCA M1 segment is normal  except for an infundibulum of the lenticulostriate origin (series 605, image 21). The left MCA bifurcation is patent. Left MCA branches appear within normal limits. No left MCA branch occlusion is identified. Venous sinuses: Insufficient venous contrast on these images. Anatomic variants: Fetal type bilateral PCA origins with diminutive distal vertebral  arteries and basilar. Review of the MIP images confirms the above findings IMPRESSION: 1.  Normal noncontrast CT appearance of the brain.  ASPECTS 10. 2. Negative for emergent large vessel occlusion. CT Perfusion data analysis is negative for infarct core or convincing penumbra. 3. The above was discussed by telephone with Dr. Roland Rack on 01/23/2016 at 0648 hours. 4. Tortuous cervical carotid and vertebral arteries with minimal atherosclerosis and no stenosis. 5. Tortuous ICA siphons with minimal atherosclerosis and no stenosis. Otherwise negative anterior circulation; small left MCA M1 infundibulum of the left lenticulostriate artery origin (normal variant). 6. Diminutive vertebrobasilar system on the basis of fetal type PCA origins. Mild atherosclerosis and stenosis of the left PCA (P2 and P3 segments). Otherwise negative posterior circulation. Electronically Signed   By: Genevie Ann M.D.   On: 01/23/2016 07:14    EKG: Independently reviewed - sinus rhythm, right bundle branch block  Assessment/Plan Principal Problem:   Acute metabolic encephalopathy Active Problems:   Hypertension   Hypercholesterolemia   Breast cancer of lower-outer quadrant of right female breast ([place])   Hyponatremia   Acute metabolic encephalopathy with expressive aphasia on presentation Patient has been seen by neurology, underwent CT brain and CT angio neck that essentially were vegative acute findings Continue neuro checks I proceed with brain MRI  Profound hyponatremia most likely associated with diarrhea and excessive water intake Replace sodium slowly, follow-up  sodium level every 2 hours  Check TSH, magnesium level.  Hypokalemia Replace and recheck  Hypertension Continue home medications-atenolol and at Diovan and adjust doses if needed  Hyperglycemia We'll check hemoglobin A1c If CBGs remain above 140 mg/dL, we will add sliding scale insulin  History of breast cancer Continue tamoxifen  Given patient's will require few days of admission to slowly corerect Na level  DVT prophylaxis: Heparin Code Status: Full Family Communication: none Disposition Plan: SDU Consults called: Neurology by ED physiscian Admission status: inpatient   York Grice, [place] Pager: [phone] [facility]  If 7PM-7AM, please contact night-coverage [email or web address] Password [facility]  01/23/2016, 8:26 AM

## 2016-01-23 NOTE — Progress Notes (Signed)
eLink Physician-Brief Progress Note Patient Name: Cynthia Watkins DOB: [birth year] MRN: [id number]   Date of Service  01/23/2016  HPI/Events of Note  Sodium on admission 107. Patient was previously on 3% hypertonic saline but this was discontinued once sodium level reached 112. Nurse reports patient has had only one glass of water. 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 Currently patient is on fluid restriction. Following plan communicated to the patient's bedside nurse.   eICU Interventions  1. Continuing fluid restriction 2. Trending sodium every 4 hour 3. Goal correction of sodium 0.5/hour 4. Nurse will contact intensivist with results of next sodium      Intervention Category Intermediate Interventions: Electrolyte abnormality - evaluation and management  Tera Partridge 01/23/2016, 10:54 PM

## 2016-01-23 NOTE — ED Notes (Signed)
Critical sodium level 107, Richardson Landry CCM NP aware.

## 2016-01-24 DIAGNOSIS — G9341 Metabolic encephalopathy: Principal | ICD-10-CM

## 2016-01-24 LAB — CBC
HCT: 36.4 % (ref 36.0–46.0)
Hemoglobin: 13.8 g/dL (ref 12.0–15.0)
MCH: 33.6 pg (ref 26.0–34.0)
MCHC: 37.9 g/dL — AB (ref 30.0–36.0)
MCV: 88.6 fL (ref 78.0–100.0)
PLATELETS: 219 10*3/uL (ref 150–400)
RBC: 4.11 MIL/uL (ref 3.87–5.11)
RDW: 12.3 % (ref 11.5–15.5)
WBC: 8.2 10*3/uL (ref 4.0–10.5)

## 2016-01-24 LAB — BASIC METABOLIC PANEL
ANION GAP: 10 (ref 5–15)
Anion gap: 9 (ref 5–15)
BUN: 6 mg/dL (ref 6–20)
BUN: 11 mg/dL (ref 6–20)
CALCIUM: 7.6 mg/dL — AB (ref 8.9–10.3)
CALCIUM: 7.9 mg/dL — AB (ref 8.9–10.3)
CO2: 23 mmol/L (ref 22–32)
CO2: 20 mmol/L — ABNORMAL LOW (ref 22–32)
CREATININE: 0.54 mg/dL (ref 0.44–1.00)
CREATININE: 0.59 mg/dL (ref 0.44–1.00)
Chloride: 80 mmol/L — ABNORMAL LOW (ref 101–111)
Chloride: 80 mmol/L — ABNORMAL LOW (ref 101–111)
GFR calc Af Amer: >60 mL/min (ref >60)
GFR calc Af Amer: >60 mL/min (ref >60)
GLUCOSE: 103 mg/dL — AB (ref 65–99)
GLUCOSE: 138 mg/dL — AB (ref 65–99)
POTASSIUM: 3.1 mmol/L — AB (ref 3.5–5.1)
Potassium: 3.5 mmol/L (ref 3.5–5.1)
Sodium: 112 mmol/L — CL (ref 135–145)
Sodium: 110 mmol/L — CL (ref 135–145)

## 2016-01-24 LAB — SODIUM
SODIUM: 110 mmol/L — AB (ref 135–145)
Sodium: 112 mmol/L — CL (ref 135–145)
Sodium: 112 mmol/L — CL (ref 135–145)
Sodium: 112 mmol/L — CL (ref 135–145)
Sodium: 110 mmol/L — CL (ref 135–145)
Sodium: 110 mmol/L — CL (ref 135–145)

## 2016-01-24 LAB — PHOSPHORUS: PHOSPHORUS: 2 mg/dL — AB (ref 2.5–4.6)

## 2016-01-24 LAB — MAGNESIUM: MAGNESIUM: 2.1 mg/dL (ref 1.7–2.4)

## 2016-01-24 MED ORDER — POTASSIUM CHLORIDE CRYS ER 20 MEQ PO TBCR
20.0000 meq | EXTENDED_RELEASE_TABLET | Freq: Once | ORAL | Status: AC
  Administered 2016-01-24: 20 meq via ORAL
  Filled 2016-01-24: qty 1

## 2016-01-24 MED ORDER — SODIUM CHLORIDE 0.9 % IV SOLN: INTRAVENOUS | Status: DC

## 2016-01-24 MED ORDER — SODIUM CHLORIDE 0.9 % IV SOLN
INTRAVENOUS | Status: DC
  Administered 2016-01-24: 05:00:00 via INTRAVENOUS

## 2016-01-24 MED ORDER — FUROSEMIDE 10 MG/ML IJ SOLN
20.0000 mg | Freq: Three times a day (TID) | INTRAMUSCULAR | Status: DC
  Administered 2016-01-24 – 2016-01-25 (×2): 20 mg via INTRAVENOUS
  Filled 2016-01-24 (×2): qty 2

## 2016-01-24 MED ORDER — SODIUM CHLORIDE 3 % IV SOLN: INTRAVENOUS | Status: DC

## 2016-01-24 MED ORDER — TOLVAPTAN 15 MG PO TABS
15.0000 mg | ORAL_TABLET | ORAL | Status: DC
  Administered 2016-01-24: 15 mg via ORAL
  Filled 2016-01-24: qty 1

## 2016-01-24 MED ORDER — ATENOLOL 25 MG PO TABS
12.5000 mg | ORAL_TABLET | Freq: Every day | ORAL | Status: DC
  Administered 2016-01-25 – 2016-01-26 (×2): 12.5 mg via ORAL
  Filled 2016-01-24 (×2): qty 1

## 2016-01-24 NOTE — Progress Notes (Signed)
eLink Physician-Brief Progress Note Patient Name: Cynthia Watkins DOB: 1924/07/07 MRN: [id number]   Date of Service  01/24/2016  HPI/Events of Note  Na+ = 112. Na+ had gone from 107 to 112 on 3% NaCl over several hours and the 3% infusion was stopped.   eICU Interventions  Will order: 1. 0.9 NaCl to run IV at 50 mL/hour. 2. Continue to trend Q 4 hour Na+.     Intervention Category Major Interventions: Electrolyte abnormality - evaluation and management  Sommer,Steven Eugene 01/24/2016, 4:22 AM

## 2016-01-24 NOTE — Progress Notes (Signed)
Sodium has decreased slightly with latest check from 112- to 110. I will add Lasix to help dilute urine and help her to eliminate more free water   Corlene Sabia A

## 2016-01-24 NOTE — Progress Notes (Signed)
[facility] PCCM  S: feeling better today, still fatigued though, weak. Sodium stable.  O:  Vitals:   01/24/16 0800 01/24/16 0955 01/24/16 1000 01/24/16 1200  BP:  106/60 104/60   Pulse:  71 71   Resp:  18 15   Temp: 98.2 F (36.8 C)   98.4 F (36.9 C)  TempSrc: Oral   Oral  SpO2:  97% 97%   Weight:      Height:       RA  Gen: elderly female, no distress HENT: OP clear, neck supple PULM: CTA B, normal percussion CV: RRR, no mgr, trace edema GI: BS+, soft, nontender Derm: no cyanosis or rash Psyche: normal mood and affect  BMP Latest Ref Rng & Units 01/24/2016 01/24/2016 01/24/2016  Glucose 65 - 99 mg/dL - 103(H) -  BUN 6 - 20 mg/dL - 6 -  Creatinine 0.44 - 1.00 mg/dL - 0.54 -  Sodium 135 - 145 mmol/L 112(LL) 112(LL) 112(LL)  Potassium 3.5 - 5.1 mmol/L - 3.1(L) -  Chloride 101 - 111 mmol/L - 80(L) -  CO2 22 - 32 mmol/L - 23 -  Calcium 8.9 - 10.3 mg/dL - 7.6(L) -   Renal records reviewed: recommend holding IVF and advancing diet, no sodium restriction, replace K  Son (hospitalist) updated at bedside at length by me  Impression: Hyponatremia Acute encephalopathy  Discussion: Mental status better  Plan: Continue monitoring BMET q4h, goal slow rise in Na, no faster than 0.26mEq/hr Appreciate renal recs, will follow  Advance diet Transfer to SDU, [place] service  Roselie Awkward, MD [place] PCCM Pager: [phone] Cell: [phone] After 3pm or if no response, call [phone]

## 2016-01-24 NOTE — Progress Notes (Signed)
CRITICAL VALUE ALERT  Critical value received:  Sodium level 110  Date of notification:  01/24/16  Time of notification:  [id number]  Critical value read back:Yes.    Nurse who received alert:  Red Christians  MD notified (1st page):  Moshe Cipro  Time of first page:  1555  MD notified (2nd page):[place]  Time of second page:1620  Responding MD:  Moshe Cipro   Time MD responded:  [phone]

## 2016-01-24 NOTE — Progress Notes (Signed)
Pt transferred to room 4E 22, Report called to [facility].

## 2016-01-24 NOTE — Progress Notes (Signed)
PULMONARY / CRITICAL CARE MEDICINE   Name: Cynthia Watkins MRN: [id number] DOB: 11-Feb-1924    ADMISSION DATE:  01/23/2016 CONSULTATION DATE:  01/23/16  REFERRING MD:  Dr. Doyne Keel   CHIEF COMPLAINT:  AMS/Aphasic   Brief:   Presented to ED 1/17 after being found confused and aphasic. NA was 106. Son reports that patient had diarrhea this week and he encouraged her to drink lots of fluids. Admitted to ICU with 3% saline.   PMH  Breast CA, HTN, RBBB  SUBJECTIVE:  No events overnight. NA 112.   VITAL SIGNS: BP 104/60   Pulse 71   Temp 98.2 F (36.8 C) (Oral)   Resp 15   Ht 5\' 2"  (1.575 m)   Wt 45.4 kg (100 lb)   SpO2 97%   BMI 18.29 kg/m   HEMODYNAMICS:    VENTILATOR SETTINGS:    INTAKE / OUTPUT: I/O last 3 completed shifts: In: 474.1 [P.O.:240; I.V.:184.1; IV Piggyback:50] Out: 2300 [Urine:2300]  PHYSICAL EXAMINATION: General: Elderly female, lying in bed, no distress     Neuro:  Alert and oriented, no deficits, follows commands  HEENT:  normocephalic  Cardiovascular: RBBB, no MRG, NI S1/S2  Lungs:  CTA Abdomen:  Soft, non-tender, non-distended  Musculoskeletal: No deformities  Skin:  Warm, dry, intact   LABS:  BMET  Recent Labs Lab 01/23/16 1638  01/23/16 1917  01/24/16 0301 01/24/16 0541 01/24/16 0953  NA 112*  < > 112*  < > 112* 112*  112* 112*  K 3.4*  --  3.6  --   --  3.1*  --   CL 80*  --  77*  --   --  80*  --   CO2 18*  --  23  --   --  23  --   BUN 6  --  6  --   --  6  --   CREATININE 0.42*  --  0.51  --   --  0.54  --   GLUCOSE 120*  --  163*  --   --  103*  --   < > = values in this interval not displayed.  Electrolytes  Recent Labs Lab 01/23/16 0815 01/23/16 1025 01/23/16 1638 01/23/16 1917 01/24/16 0301 01/24/16 0541  CALCIUM 7.6* 7.5* 7.4* 7.7*  --  7.6*  MG 1.4*  --   --   --  2.1  --   PHOS  --  2.2*  --   --  2.0*  --     CBC  Recent Labs Lab 01/23/16 0558 01/23/16 0625 01/23/16 0815 01/24/16 0301  WBC  3.4*  --  4.1 8.2  HGB 13.2 14.3 13.1 13.8  HCT 35.2* 42.0 34.2* 36.4  PLT 149*  --  155 219    Coag's  Recent Labs Lab 01/23/16 0558  APTT 32  INR 1.07    Sepsis Markers  Recent Labs Lab 01/23/16 0625  LATICACIDVEN 1.51    ABG No results for input(s): PHART, PCO2ART, PO2ART in the last 168 hours.  Liver Enzymes  Recent Labs Lab 01/23/16 0558  AST 31  ALT 18  ALKPHOS 39  BILITOT 1.2  ALBUMIN 3.7    Cardiac Enzymes No results for input(s): TROPONINI, PROBNP in the last 168 hours.  Glucose No results for input(s): GLUCAP in the last 168 hours.  Imaging No results found.   STUDIES:  CTA Head/Neck 1/17 > Neg acute  CT chest 1/18 >  CULTURES: None  ANTIBIOTICS: None  SIGNIFICANT EVENTS: 1/17 > Presents to ED AMS/Hyponaterium   LINES/TUBES:   DISCUSSION: 83 year old female, independent living facility, presents ED on 1/17 with asphasia, CT Head Negative. Found to be hyponatremic with sodium at 106.   ASSESSMENT / PLAN:  PULMONARY A: No issues  P:   Maintain saturation >92  CARDIOVASCULAR A:  H/O HTN, RBBB P:  Cardiac Monitoring  Continue home atenolol (takes 12.5mg  daily)  D/C irbesartan (family request - speaking to primary cardiologist)   RENAL A:   Hyponatremia secondary to access fluid intake  Hypomag  -Osmolality 224 P:   Trend sodim q4H Replace K+  Restrict Fluid intake 1L daily  Goal correction sodium 0.5/hr  GASTROINTESTINAL A:   Recent diarrhea P:   PPI  Regular Diet   HEMATOLOGIC A:   H/O Breast CA P:  Continue home tamoxifen  Trend CBC  CT Chest for malignancy work-up   INFECTIOUS A:   No issues  P:   Trend WBC and Fever Curve   ENDOCRINE A:   Hyperglycemia  P:   q4H glucose checks SSI  NEUROLOGIC A:   Acute Metabolic Encephalopathy secondary to hyponatremia  P:   Treat NA as above  Monitor  Neurology following  RASS goal: 0    FAMILY  - Updates: Son updated at bedside 1/17  -  Inter-disciplinary family meet or Palliative Care meeting due by:  1/24  Hayden Pedro, AG-ACNP [facility]  Pgr: [phone]  PCCM Pgr: [phone]

## 2016-01-24 NOTE — Procedures (Signed)
EEG REPORT  Description This is a routine inpatient EEG done at the bedside with standard technique.  The patient was listed as being awake and drowsy throughout the recording. No video or activating procedures were done.  The patient was not listed as being on anti-seizure medication.  Interpretation Background activity shows 6-7 Hz theta activity with periods of further slowing.  The background was of normal amplitude and symmetric but very disorganized.  There was scattered noise from muscle/movement and eye-opening artifact.  Activating procedures were not done.  Also, there was frontal intermittent rhythmic delta activity (FIRDA) seen during the recording.  Impression This is an abnormal EEG due to moderate generalized slowing as well as FIRDA.  Clinical Correlation These findings are consistent with a non-specific encephalopathy likely metabolic. There is no evidence for seizure activity at this time.  Dr. Lawana Pai [facility] [phone]  01/24/2016, 11:02 AM

## 2016-01-24 NOTE — Progress Notes (Addendum)
CRITICAL VALUE ALERT  Critical value received:  Sodium 110  Date of notification:  01/24/16   Time of notification:  2213  Critical value read back:Yes.    Nurse who received alert:  Levonne Hubert   MD notified (1st page):  Moshe Cipro, MD (nephrology) and Frederik Pear, NP [facility]) and PCCM  Time of first page:  2215  MD notified (2nd page):  Time of second page:  Responding MD:  Moshe Cipro, MD and Frederik Pear, NP and PCCM  Time MD responded:  2217

## 2016-01-24 NOTE — Care Management Note (Signed)
Case Management Note  Patient Details  Name: Cynthia Watkins MRN: [id number] Date of Birth: Jul 07, 1924  Subjective/Objective:  Pt admitted on 01/23/16 with AMS, hyponatremia.  PTA, pt resided at [facility].  She has supportive sons, at bedside.                    Action/Plan: Will follow for discharge planning as pt progresses.  Would recommend PT/OT consults to determine LOC needed at dc.     Expected Discharge Date:                  Expected Discharge Plan:  [place]  In-House Referral:     Discharge planning Services  CM Consult  Post Acute Care Choice:    Choice offered to:     DME Arranged:    DME Agency:     HH Arranged:    [place] Agency:     Status of Service:  In process, will continue to follow  If discussed at Long Length of Stay Meetings, dates discussed:    Additional Comments:  Ella Bodo, RN 01/24/2016, 4:39 PM

## 2016-01-24 NOTE — Progress Notes (Signed)
[facility] Progress Note    Assessment/ Plan:   1.  Hyponatremia: pt presenting with severe hyponatremia and associated neurologic deficits.  Stroke workup negative. Last known serum sodium 130 in 09/24/2015. Hypertonic saline administered yesterday with correction of serum sodium from 106--> 112 in 24 hours.  Serum osms 227, urine osms 254, and urine sodium 72.  Maximally dilute urine is 50-100 mmol/L; however given pt's age I would expect her maximally dilute urine to be around 150 mmol/ L or so.  Urine osms of 254 indicates an inappropriate presence of ADH.  Moreover, given the clinical picture, I feel that fluid intake without concomitant adequate solute intake was playing a role too.  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  Plan is thusly:    - continue 1L fluid restriction - would not restrict sodium in diet - q 4 serum sodiums - d/c NS--> especially since she is hypokalemic, repleting potassium even gently has the potential to induce a water diuresis so would replete with caution and do no more than 20 mEq to avoid overcorrection of serum sodium - if serum sodium continues to correct, would consider transfer out of ICU - consider a malignancy workup, esp in setting of prior breast cancer and prior low serum sodiums as described above- have talked to son about this, will order non-con CT chest (esp with breast ca) FOBT, will order AM cortisol too.  TSH normal  Subjective:    Serum sodium 106--> 112 in 24 hours.  Able to talk and converse this AM ; does not remember events of yesterday.  Slightly hypokalemic.   Objective:   BP 124/65   Pulse 64   Temp 98.2 F (36.8 C) (Oral)   Resp 15   Ht 5\' 2"  (1.575 m)   Wt 45.4 kg (100 lb)   SpO2 96%   BMI 18.29 kg/m   Intake/Output Summary (Last 24 hours) at 01/24/16 0858 Last data filed at 01/24/16 0600  Gross per 24 hour  Intake           407.83 ml  Output             1800 ml   Net         -1392.17 ml   Weight change:   Physical Exam: Gen: sitting up in chair, NAD, awake and alert HEENT: MMM CVS: RRR no mr/g Resp: CTAB no c/w/r Abd: soft, nontender nondistended, NABS Ext: no LE edema  Imaging: Ct Angio Head W Or Wo Contrast  Result Date: 01/23/2016 CLINICAL DATA:  83 year old female with global aphasia. Initial encounter. EXAM: CT ANGIOGRAPHY HEAD AND NECK CT HEAD PERFUSION TECHNIQUE: Multiphase CT imaging of the brain was performed following IV bolus contrast injection. Subsequent parametric perfusion maps were calculated using RAPID software. Multidetector CT imaging of the head and neck was performed using the standard protocol during bolus administration of intravenous contrast. Multiplanar CT image reconstructions and MIPs were obtained to evaluate the vascular anatomy. Carotid stenosis measurements (when applicable) are obtained utilizing NASCET criteria, using the distal internal carotid diameter as the denominator. CONTRAST:  100 mL Isovue 370 COMPARISON:  None. FINDINGS: CT HEAD FINDINGS Brain: No midline shift, ventriculomegaly, mass effect, evidence of mass lesion, intracranial hemorrhage or evidence of cortically based acute infarction. Gray-white matter differentiation is within normal limits throughout the brain. No cortical encephalomalacia identified. ASPECTS 10. Vascular: See below. Skull: No acute osseous abnormality identified. Sinuses: Clear. Orbits: Postoperative changes to both globes.  No acute orbit or scalp soft tissue finding. CT BRAIN PERFUSION FINDINGS: CBF (<30%) Volume:  0 mL Perfusion (Tmax>6.0s) volume: 8 mL (in the left cerebellar hemisphere, likely artifact). 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 No acute osseous abnormality identified. Upper chest: Negative lung apices ; mild apical scarring and dependent atelectasis. Other neck: Negative  thyroid. Larynx, pharynx, parapharyngeal spaces, retropharyngeal space, sublingual space, submandibular glands and parotid glands are within normal limits. No lymphadenopathy. Aortic arch: 3 vessel arch configuration. Minimal arch atherosclerosis. No great vessel origin stenosis. Right carotid system: Negative except for tortuosity and minimal right carotid bifurcation atherosclerosis. Left carotid system: Negative except for tortuosity in minimal left carotid bifurcation atherosclerosis. Vertebral arteries:No proximal subclavian artery stenosis. Normal right vertebral artery origin. Tortuous right V1 segment which is in proximity to thyrocervical branches but as expected does not appear to give off any branches. Tortuous right V2 segment. Tortuous right V3 segment. No vertebral artery stenosis to the skullbase. Normal left vertebral artery origin. Tortuous left V1 segment. Tortuous left V2 and V3 segments. No left vertebral artery stenosis to the skullbase. CTA HEAD Posterior circulation: Both vertebral artery V4 segments and the basilar are diminutive. Both PICA origins appear patent. Mild fenestration of the vertebrobasilar junction. Diminutive basilar artery without stenosis. Fetal type bilateral PCA origins. SCA origins are patent. The right PCA branches are normal. There is mild irregularity of the left PCA P2 and P3 segments with preserved distal enhancement. Anterior circulation: Both ICA siphons are patent. Both cavernous segments are tortuous. Mild bilateral siphon calcified plaque without stenosis. Ophthalmic and posterior communicating artery origins are normal. Normal carotid termini, MCA and ACA origin. Anterior communicating artery and bilateral ACA branches are within normal limits. There is a small median artery of the corpus callosum. Right MCA M1 segment, trifurcation, and right MCA branches are within normal limits. Left MCA M1 segment is normal except for an infundibulum of the lenticulostriate  origin (series 605, image 21). The left MCA bifurcation is patent. Left MCA branches appear within normal limits. No left MCA branch occlusion is identified. Venous sinuses: Insufficient venous contrast on these images. Anatomic variants: Fetal type bilateral PCA origins with diminutive distal vertebral arteries and basilar. Review of the MIP images confirms the above findings IMPRESSION: 1.  Normal noncontrast CT appearance of the brain.  ASPECTS 10. 2. Negative for emergent large vessel occlusion. CT Perfusion data analysis is negative for infarct core or convincing penumbra. 3. The above was discussed by telephone with Dr. Roland Rack on 01/23/2016 at 0648 hours. 4. Tortuous cervical carotid and vertebral arteries with minimal atherosclerosis and no stenosis. 5. Tortuous ICA siphons with minimal atherosclerosis and no stenosis. Otherwise negative anterior circulation; small left MCA M1 infundibulum of the left lenticulostriate artery origin (normal variant). 6. Diminutive vertebrobasilar system on the basis of fetal type PCA origins. Mild atherosclerosis and stenosis of the left PCA (P2 and P3 segments). Otherwise negative posterior circulation. Electronically Signed   By: Genevie Ann M.D.   On: 01/23/2016 07:14   Ct Head Wo Contrast  Result Date: 01/23/2016 CLINICAL DATA:  83 year old female with global aphasia. Initial encounter. EXAM: CT ANGIOGRAPHY HEAD AND NECK CT HEAD PERFUSION TECHNIQUE: Multiphase CT imaging of the brain was performed following IV bolus contrast injection. Subsequent parametric perfusion maps were calculated using RAPID software. Multidetector CT imaging of the head and neck was performed using the standard protocol during bolus administration of  intravenous contrast. Multiplanar CT image reconstructions and MIPs were obtained to evaluate the vascular anatomy. Carotid stenosis measurements (when applicable) are obtained utilizing NASCET criteria, using the distal internal carotid  diameter as the denominator. CONTRAST:  100 mL Isovue 370 COMPARISON:  None. FINDINGS: CT HEAD FINDINGS Brain: No midline shift, ventriculomegaly, mass effect, evidence of mass lesion, intracranial hemorrhage or evidence of cortically based acute infarction. Gray-white matter differentiation is within normal limits throughout the brain. No cortical encephalomalacia identified. ASPECTS 10. Vascular: See below. Skull: No acute osseous abnormality identified. Sinuses: Clear. Orbits: Postoperative changes to both globes. No acute orbit or scalp soft tissue finding. CT BRAIN PERFUSION FINDINGS: CBF (<30%) Volume:  0 mL Perfusion (Tmax>6.0s) volume: 8 mL (in the left cerebellar hemisphere, likely artifact). 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 No acute osseous abnormality identified. Upper chest: Negative lung apices ; mild apical scarring and dependent atelectasis. Other neck: Negative thyroid. Larynx, pharynx, parapharyngeal spaces, retropharyngeal space, sublingual space, submandibular glands and parotid glands are within normal limits. No lymphadenopathy. Aortic arch: 3 vessel arch configuration. Minimal arch atherosclerosis. No great vessel origin stenosis. Right carotid system: Negative except for tortuosity and minimal right carotid bifurcation atherosclerosis. Left carotid system: Negative except for tortuosity in minimal left carotid bifurcation atherosclerosis. Vertebral arteries:No proximal subclavian artery stenosis. Normal right vertebral artery origin. Tortuous right V1 segment which is in proximity to thyrocervical branches but as expected does not appear to give off any branches. Tortuous right V2 segment. Tortuous right V3 segment. No vertebral artery stenosis to the skullbase. Normal left vertebral artery origin. Tortuous left V1 segment. Tortuous left V2 and V3 segments. No left vertebral artery stenosis  to the skullbase. CTA HEAD Posterior circulation: Both vertebral artery V4 segments and the basilar are diminutive. Both PICA origins appear patent. Mild fenestration of the vertebrobasilar junction. Diminutive basilar artery without stenosis. Fetal type bilateral PCA origins. SCA origins are patent. The right PCA branches are normal. There is mild irregularity of the left PCA P2 and P3 segments with preserved distal enhancement. Anterior circulation: Both ICA siphons are patent. Both cavernous segments are tortuous. Mild bilateral siphon calcified plaque without stenosis. Ophthalmic and posterior communicating artery origins are normal. Normal carotid termini, MCA and ACA origin. Anterior communicating artery and bilateral ACA branches are within normal limits. There is a small median artery of the corpus callosum. Right MCA M1 segment, trifurcation, and right MCA branches are within normal limits. Left MCA M1 segment is normal except for an infundibulum of the lenticulostriate origin (series 605, image 21). The left MCA bifurcation is patent. Left MCA branches appear within normal limits. No left MCA branch occlusion is identified. Venous sinuses: Insufficient venous contrast on these images. Anatomic variants: Fetal type bilateral PCA origins with diminutive distal vertebral arteries and basilar. Review of the MIP images confirms the above findings IMPRESSION: 1.  Normal noncontrast CT appearance of the brain.  ASPECTS 10. 2. Negative for emergent large vessel occlusion. CT Perfusion data analysis is negative for infarct core or convincing penumbra. 3. The above was discussed by telephone with Dr. Roland Rack on 01/23/2016 at 0648 hours. 4. Tortuous cervical carotid and vertebral arteries with minimal atherosclerosis and no stenosis. 5. Tortuous ICA siphons with minimal atherosclerosis and no stenosis. Otherwise negative anterior circulation; small left MCA M1 infundibulum of the left lenticulostriate  artery origin (normal variant). 6. Diminutive vertebrobasilar system on the basis of fetal type PCA  origins. Mild atherosclerosis and stenosis of the left PCA (P2 and P3 segments). Otherwise negative posterior circulation. Electronically Signed   By: Genevie Ann M.D.   On: 01/23/2016 07:14   Ct Angio Neck W Or Wo Contrast  Result Date: 01/23/2016 CLINICAL DATA:  83 year old female with global aphasia. Initial encounter. EXAM: CT ANGIOGRAPHY HEAD AND NECK CT HEAD PERFUSION TECHNIQUE: Multiphase CT imaging of the brain was performed following IV bolus contrast injection. Subsequent parametric perfusion maps were calculated using RAPID software. Multidetector CT imaging of the head and neck was performed using the standard protocol during bolus administration of intravenous contrast. Multiplanar CT image reconstructions and MIPs were obtained to evaluate the vascular anatomy. Carotid stenosis measurements (when applicable) are obtained utilizing NASCET criteria, using the distal internal carotid diameter as the denominator. CONTRAST:  100 mL Isovue 370 COMPARISON:  None. FINDINGS: CT HEAD FINDINGS Brain: No midline shift, ventriculomegaly, mass effect, evidence of mass lesion, intracranial hemorrhage or evidence of cortically based acute infarction. Gray-white matter differentiation is within normal limits throughout the brain. No cortical encephalomalacia identified. ASPECTS 10. Vascular: See below. Skull: No acute osseous abnormality identified. Sinuses: Clear. Orbits: Postoperative changes to both globes. No acute orbit or scalp soft tissue finding. CT BRAIN PERFUSION FINDINGS: CBF (<30%) Volume:  0 mL Perfusion (Tmax>6.0s) volume: 8 mL (in the left cerebellar hemisphere, likely artifact). 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 No acute osseous abnormality identified. Upper chest: Negative lung apices ; mild apical  scarring and dependent atelectasis. Other neck: Negative thyroid. Larynx, pharynx, parapharyngeal spaces, retropharyngeal space, sublingual space, submandibular glands and parotid glands are within normal limits. No lymphadenopathy. Aortic arch: 3 vessel arch configuration. Minimal arch atherosclerosis. No great vessel origin stenosis. Right carotid system: Negative except for tortuosity and minimal right carotid bifurcation atherosclerosis. Left carotid system: Negative except for tortuosity in minimal left carotid bifurcation atherosclerosis. Vertebral arteries:No proximal subclavian artery stenosis. Normal right vertebral artery origin. Tortuous right V1 segment which is in proximity to thyrocervical branches but as expected does not appear to give off any branches. Tortuous right V2 segment. Tortuous right V3 segment. No vertebral artery stenosis to the skullbase. Normal left vertebral artery origin. Tortuous left V1 segment. Tortuous left V2 and V3 segments. No left vertebral artery stenosis to the skullbase. CTA HEAD Posterior circulation: Both vertebral artery V4 segments and the basilar are diminutive. Both PICA origins appear patent. Mild fenestration of the vertebrobasilar junction. Diminutive basilar artery without stenosis. Fetal type bilateral PCA origins. SCA origins are patent. The right PCA branches are normal. There is mild irregularity of the left PCA P2 and P3 segments with preserved distal enhancement. Anterior circulation: Both ICA siphons are patent. Both cavernous segments are tortuous. Mild bilateral siphon calcified plaque without stenosis. Ophthalmic and posterior communicating artery origins are normal. Normal carotid termini, MCA and ACA origin. Anterior communicating artery and bilateral ACA branches are within normal limits. There is a small median artery of the corpus callosum. Right MCA M1 segment, trifurcation, and right MCA branches are within normal limits. Left MCA M1 segment is  normal except for an infundibulum of the lenticulostriate origin (series 605, image 21). The left MCA bifurcation is patent. Left MCA branches appear within normal limits. No left MCA branch occlusion is identified. Venous sinuses: Insufficient venous contrast on these images. Anatomic variants: Fetal type bilateral PCA origins with diminutive distal vertebral arteries and basilar. Review of the MIP images  confirms the above findings IMPRESSION: 1.  Normal noncontrast CT appearance of the brain.  ASPECTS 10. 2. Negative for emergent large vessel occlusion. CT Perfusion data analysis is negative for infarct core or convincing penumbra. 3. The above was discussed by telephone with Dr. Roland Rack on 01/23/2016 at 0648 hours. 4. Tortuous cervical carotid and vertebral arteries with minimal atherosclerosis and no stenosis. 5. Tortuous ICA siphons with minimal atherosclerosis and no stenosis. Otherwise negative anterior circulation; small left MCA M1 infundibulum of the left lenticulostriate artery origin (normal variant). 6. Diminutive vertebrobasilar system on the basis of fetal type PCA origins. Mild atherosclerosis and stenosis of the left PCA (P2 and P3 segments). Otherwise negative posterior circulation. Electronically Signed   By: Genevie Ann M.D.   On: 01/23/2016 07:14   Ct Cerebral Perfusion W Contrast  Result Date: 01/23/2016 CLINICAL DATA:  83 year old female with global aphasia. Initial encounter. EXAM: CT ANGIOGRAPHY HEAD AND NECK CT HEAD PERFUSION TECHNIQUE: Multiphase CT imaging of the brain was performed following IV bolus contrast injection. Subsequent parametric perfusion maps were calculated using RAPID software. Multidetector CT imaging of the head and neck was performed using the standard protocol during bolus administration of intravenous contrast. Multiplanar CT image reconstructions and MIPs were obtained to evaluate the vascular anatomy. Carotid stenosis measurements (when applicable) are  obtained utilizing NASCET criteria, using the distal internal carotid diameter as the denominator. CONTRAST:  100 mL Isovue 370 COMPARISON:  None. FINDINGS: CT HEAD FINDINGS Brain: No midline shift, ventriculomegaly, mass effect, evidence of mass lesion, intracranial hemorrhage or evidence of cortically based acute infarction. Gray-white matter differentiation is within normal limits throughout the brain. No cortical encephalomalacia identified. ASPECTS 10. Vascular: See below. Skull: No acute osseous abnormality identified. Sinuses: Clear. Orbits: Postoperative changes to both globes. No acute orbit or scalp soft tissue finding. CT BRAIN PERFUSION FINDINGS: CBF (<30%) Volume:  0 mL Perfusion (Tmax>6.0s) volume: 8 mL (in the left cerebellar hemisphere, likely artifact). 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 No acute osseous abnormality identified. Upper chest: Negative lung apices ; mild apical scarring and dependent atelectasis. Other neck: Negative thyroid. Larynx, pharynx, parapharyngeal spaces, retropharyngeal space, sublingual space, submandibular glands and parotid glands are within normal limits. No lymphadenopathy. Aortic arch: 3 vessel arch configuration. Minimal arch atherosclerosis. No great vessel origin stenosis. Right carotid system: Negative except for tortuosity and minimal right carotid bifurcation atherosclerosis. Left carotid system: Negative except for tortuosity in minimal left carotid bifurcation atherosclerosis. Vertebral arteries:No proximal subclavian artery stenosis. Normal right vertebral artery origin. Tortuous right V1 segment which is in proximity to thyrocervical branches but as expected does not appear to give off any branches. Tortuous right V2 segment. Tortuous right V3 segment. No vertebral artery stenosis to the skullbase. Normal left vertebral artery origin. Tortuous left V1  segment. Tortuous left V2 and V3 segments. No left vertebral artery stenosis to the skullbase. CTA HEAD Posterior circulation: Both vertebral artery V4 segments and the basilar are diminutive. Both PICA origins appear patent. Mild fenestration of the vertebrobasilar junction. Diminutive basilar artery without stenosis. Fetal type bilateral PCA origins. SCA origins are patent. The right PCA branches are normal. There is mild irregularity of the left PCA P2 and P3 segments with preserved distal enhancement. Anterior circulation: Both ICA siphons are patent. Both cavernous segments are tortuous. Mild bilateral siphon calcified plaque without stenosis. Ophthalmic and posterior communicating artery origins are normal. Normal carotid termini, MCA and  ACA origin. Anterior communicating artery and bilateral ACA branches are within normal limits. There is a small median artery of the corpus callosum. Right MCA M1 segment, trifurcation, and right MCA branches are within normal limits. Left MCA M1 segment is normal except for an infundibulum of the lenticulostriate origin (series 605, image 21). The left MCA bifurcation is patent. Left MCA branches appear within normal limits. No left MCA branch occlusion is identified. Venous sinuses: Insufficient venous contrast on these images. Anatomic variants: Fetal type bilateral PCA origins with diminutive distal vertebral arteries and basilar. Review of the MIP images confirms the above findings IMPRESSION: 1.  Normal noncontrast CT appearance of the brain.  ASPECTS 10. 2. Negative for emergent large vessel occlusion. CT Perfusion data analysis is negative for infarct core or convincing penumbra. 3. The above was discussed by telephone with Dr. Roland Rack on 01/23/2016 at 0648 hours. 4. Tortuous cervical carotid and vertebral arteries with minimal atherosclerosis and no stenosis. 5. Tortuous ICA siphons with minimal atherosclerosis and no stenosis. Otherwise negative anterior  circulation; small left MCA M1 infundibulum of the left lenticulostriate artery origin (normal variant). 6. Diminutive vertebrobasilar system on the basis of fetal type PCA origins. Mild atherosclerosis and stenosis of the left PCA (P2 and P3 segments). Otherwise negative posterior circulation. Electronically Signed   By: Genevie Ann M.D.   On: 01/23/2016 07:14    Labs: BMET  Recent Labs Lab 01/23/16 0558 01/23/16 0625 01/23/16 0815 01/23/16 1025 01/23/16 1638 01/23/16 1648 01/23/16 1917 01/23/16 2128 01/24/16 0301 01/24/16 0541  NA 107* 108* 106* 107* 112* 111* 112* 111* 112* 112*  112*  K 3.4* 3.5 3.5 3.6 3.4*  --  3.6  --   --  3.1*  CL 73* 72* 73* 75* 80*  --  77*  --   --  80*  CO2 19*  --  20* 20* 18*  --  23  --   --  23  GLUCOSE 154* 155* 153* 149* 120*  --  163*  --   --  103*  BUN 9 9 7 6 6   --  6  --   --  6  CREATININE 0.55 0.50 0.60 0.60 0.42*  --  0.51  --   --  0.54  CALCIUM 7.7*  --  7.6* 7.5* 7.4*  --  7.7*  --   --  7.6*  PHOS  --   --   --  2.2*  --   --   --   --  2.0*  --    CBC  Recent Labs Lab 01/23/16 0558 01/23/16 0625 01/23/16 0815 01/24/16 0301  WBC 3.4*  --  4.1 8.2  NEUTROABS 2.6  --   --   --   HGB 13.2 14.3 13.1 13.8  HCT 35.2* 42.0 34.2* 36.4  MCV 88.4  --  87.0 88.6  PLT 149*  --  155 219    Medications:    . atenolol  25 mg Oral Daily  . heparin  5,000 Units Subcutaneous Q8H  . irbesartan  37.5 mg Oral Daily  . LORazepam  2 mg Intravenous Q2H  . tamoxifen  20 mg Oral Daily      Madelon Lips MD 01/24/2016, 8:58 AM

## 2016-01-25 ENCOUNTER — Inpatient Hospital Stay (HOSPITAL_COMMUNITY): Payer: Medicare Other

## 2016-01-25 DIAGNOSIS — G9340 Encephalopathy, unspecified: Secondary | ICD-10-CM

## 2016-01-25 LAB — CBC
HCT: 38.9 % (ref 36.0–46.0)
Hemoglobin: 14.7 g/dL (ref 12.0–15.0)
MCH: 33.5 pg (ref 26.0–34.0)
MCHC: 37.8 g/dL — ABNORMAL HIGH (ref 30.0–36.0)
MCV: 88.6 fL (ref 78.0–100.0)
PLATELETS: 227 10*3/uL (ref 150–400)
RBC: 4.39 MIL/uL (ref 3.87–5.11)
RDW: 12.6 % (ref 11.5–15.5)
WBC: 5.9 10*3/uL (ref 4.0–10.5)

## 2016-01-25 LAB — BASIC METABOLIC PANEL
ANION GAP: 9 (ref 5–15)
BUN: 8 mg/dL (ref 6–20)
CO2: 24 mmol/L (ref 22–32)
Calcium: 7.9 mg/dL — ABNORMAL LOW (ref 8.9–10.3)
Chloride: 84 mmol/L — ABNORMAL LOW (ref 101–111)
Creatinine, Ser: 0.58 mg/dL (ref 0.44–1.00)
GFR calc Af Amer: >60 mL/min (ref >60)
GFR calc non Af Amer: >60 mL/min (ref >60)
GLUCOSE: 112 mg/dL — AB (ref 65–99)
Potassium: 3.5 mmol/L (ref 3.5–5.1)
Sodium: 117 mmol/L — CL (ref 135–145)

## 2016-01-25 LAB — SODIUM
SODIUM: 121 mmol/L — AB (ref 135–145)
SODIUM: 120 mmol/L — AB (ref 135–145)
Sodium: 122 mmol/L — ABNORMAL LOW (ref 135–145)
Sodium: 120 mmol/L — ABNORMAL LOW (ref 135–145)
Sodium: 121 mmol/L — ABNORMAL LOW (ref 135–145)

## 2016-01-25 LAB — HEMOGLOBIN A1C
HEMOGLOBIN A1C: 5.9 % — AB (ref 4.8–5.6)
MEAN PLASMA GLUCOSE: 123 mg/dL

## 2016-01-25 LAB — MAGNESIUM: MAGNESIUM: 2.4 mg/dL (ref 1.7–2.4)

## 2016-01-25 LAB — CORTISOL-AM, BLOOD: CORTISOL - AM: 22.1 ug/dL (ref 6.7–22.6)

## 2016-01-25 LAB — PHOSPHORUS: PHOSPHORUS: 2.1 mg/dL — AB (ref 2.5–4.6)

## 2016-01-25 MED ORDER — DEXTROSE 5 % IV SOLN
INTRAVENOUS | Status: DC
  Administered 2016-01-25: 08:00:00 via INTRAVENOUS

## 2016-01-25 MED ORDER — FUROSEMIDE 10 MG/ML IJ SOLN: 20.0000 mg | Freq: Two times a day (BID) | INTRAMUSCULAR | Status: DC

## 2016-01-25 NOTE — Progress Notes (Signed)
[facility] PCCM  S: feeling better today, still fatigued though, weak. Sodium with some variations..  O:  Vitals:   01/25/16 0411 01/25/16 0500 01/25/16 0700 01/25/16 0807  BP: (!) 95/56 (!) 98/55 117/60 112/63  Pulse: 63 (!) 55 (!) 56 (!) 53  Resp: 15 14 14 16   Temp: 97 F (36.1 C)   98.2 F (36.8 C)  TempSrc: Axillary   Oral  SpO2: 97% 98% 97% 98%  Weight:      Height:        Recent Labs Lab 01/24/16 2112 01/25/16 0131 01/25/16 0543  NA 110* 117* 122*    RA  Gen: elderly female, no distress HENT: OP clear, neck supple PULM: CTA B, normal percussion CV: RRR, no mgr, trace edema GI: BS+, soft, nontender Derm: no cyanosis or rash Psyche: normal mood and affect, much improved  BMP Latest Ref Rng & Units 01/25/2016 01/25/2016 01/24/2016  Glucose 65 - 99 mg/dL - 112(H) -  BUN 6 - 20 mg/dL - 8 -  Creatinine 0.44 - 1.00 mg/dL - 0.58 -  Sodium 135 - 145 mmol/L 122(L) 117(LL) 110(LL)  Potassium 3.5 - 5.1 mmol/L - 3.5 -  Chloride 101 - 111 mmol/L - 84(L) -  CO2 22 - 32 mmol/L - 24 -  Calcium 8.9 - 10.3 mg/dL - 7.9(L) -   Renal records reviewed: recommend holding IVF and advancing diet, no sodium restriction, replace K  Son (hospitalist) updated at bedside at length by me  Impression: Hyponatremia Acute encephalopathy  Discussion: Mental status better. Variations in Na but improved overall/  Plan: Continue monitoring BMET q4h, goal slow rise in Na, no faster than 0.56mEq/hr Appreciate renal recs, will follow  Advance diet  SDU, [place] service. PCCM will sign off. Appreciate Dr. Verlon Au and [facility] team taking over.  Richardson Landry England Greb ACNP Maryanna Shape PCCM Pager [phone] till 3 pm If no answer page [phone] 01/25/2016, 10:31 AM

## 2016-01-25 NOTE — Progress Notes (Signed)
[facility] Progress Note    Assessment/ Plan:   1.  Hyponatremia: pt presenting with severe hyponatremia and associated neurologic deficits.  Stroke workup negative. Last known serum sodium 130 in 09/24/2015.   Serum osms 227, urine osms 254, and urine sodium 72.  Maximally dilute urine is 50-100 mmol/L; however given pt's age I would expect her maximally dilute urine to be around 150 mmol/ L or so.  Urine osms of 254 indicates an inappropriate presence of ADH.  Moreover, given the clinical picture, I feel that fluid intake without concomitant adequate solute intake was playing a role too.  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  Plan is thusly:    - continue 1L fluid restriction - would not restrict sodium in diet - continue q 4 serum sodiums - would not administer normal saline as this will not correct serum sodium. - D5 started to avoid overcorrection Na in the setting of Lasix and tolvaptan administration.  If serum sodium stable/ falling on recheck, will d/c D5.  Will resume Lasix when appropriate - TSH normal - AM cortisol pending - CT chest - FOBT  Subjective:    Na 112--> (NS admin) 110 --> 110--> (lasix) --> 117 (tolvaptan) --> 122.  Now on D5  Sitting up in chair, eating breakfast.    Objective:   BP 134/71 (BP Location: Left Arm)   Pulse (!) 50   Temp 97.2 F (36.2 C) (Oral)   Resp 13   Ht 5\' 2"  (1.575 m)   Wt 45.4 kg (100 lb)   SpO2 100%   BMI 18.29 kg/m   Intake/Output Summary (Last 24 hours) at 01/25/16 1326 Last data filed at 01/25/16 1229  Gross per 24 hour  Intake           575.33 ml  Output             3325 ml  Net         -2749.67 ml   Weight change:   Physical Exam: Gen: sitting up in chair, NAD, awake and alert HEENT: MMM CVS: RRR no mr/g Resp: CTAB no c/w/r Abd: soft, nontender nondistended, NABS Ext: no LE edema  Imaging: Ct Chest Wo Contrast  Result Date: 01/25/2016 CLINICAL  DATA:  83 year old female with history of hepatic encephalopathy and hyponatremia. History of breast cancer status post radiation therapy and chemotherapy. EXAM: CT CHEST WITHOUT CONTRAST TECHNIQUE: Multidetector CT imaging of the chest was performed following the standard protocol without IV contrast. COMPARISON:  No priors. FINDINGS: Cardiovascular: Heart size is normal. There is no significant pericardial fluid, thickening or pericardial calcification. There is aortic atherosclerosis, as well as atherosclerosis of the great vessels of the mediastinum and the coronary arteries, including calcified atherosclerotic plaque in the left main, left anterior descending, left circumflex and right coronary arteries. Calcifications of the aortic valve. Mediastinum/Nodes: No pathologically enlarged mediastinal or hilar lymph nodes. Please note that accurate exclusion of hilar adenopathy is limited on noncontrast CT scans. Densely calcified right hilar lymph node incidentally noted. Esophagus is unremarkable in appearance. No axillary lymphadenopathy. Lungs/Pleura: A few scattered tiny calcified granulomas are noted. Several other 2-3 mm pulmonary nodules are seen scattered throughout the lungs bilaterally, highly nonspecific, but statistically likely to represent benign areas of mucoid impaction within terminal bronchioles. 5 mm subpleural nodule in the medial aspect of the superior segment of the left lower lobe (image 67 of series 7). No other larger more suspicious appearing pulmonary  nodules or masses are noted. Mild scarring in the dependent portions of the lower lobes of the lungs bilaterally. No acute consolidative airspace disease. No pleural effusions. Upper Abdomen: Aortic atherosclerosis. Numerous calcified granulomas throughout the spleen. Musculoskeletal: There are no aggressive appearing lytic or blastic lesions noted in the visualized portions of the skeleton. IMPRESSION: 1. No acute findings in the thorax. 2.  Multiple tiny pulmonary nodules measuring 5 mm or less in size, favored to be benign. However, given the patient's history breast cancer, noncontrast chest CT is recommended in 1 year to ensure the stability of these findings. This recommendation follows the consensus statement: Guidelines for Management of Incidental Pulmonary Nodules Detected on CT Images: From the Fleischner Society 2017; Radiology 2017; 284:228-243. 3. Aortic atherosclerosis, in addition to left main and 3 vessel coronary artery disease. Please note that although the presence of coronary artery calcium documents the presence of coronary artery disease, the severity of this disease and any potential stenosis cannot be assessed on this non-gated CT examination. Assessment for potential risk factor modification, dietary therapy or pharmacologic therapy may be warranted, if clinically indicated. 4. There are calcifications of the aortic valve. Echocardiographic correlation for evaluation of potential valvular dysfunction may be warranted if clinically indicated. 5. Old granulomatous disease, as above. Electronically Signed   By: Vinnie Langton M.D.   On: 01/25/2016 13:21    Labs: BMET  Recent Labs Lab 01/23/16 0815 01/23/16 1025 01/23/16 1638  01/23/16 1917  01/24/16 0301 01/24/16 0541 01/24/16 0953 01/24/16 1442 01/24/16 1535 01/24/16 2112 01/25/16 0131 01/25/16 0543 01/25/16 1052  NA 106* 107* 112*  < > 112*  < > 112* 112*  112* 112* 110* 110*  110* 110* 117* 122* 120*  K 3.5 3.6 3.4*  --  3.6  --   --  3.1*  --   --  3.5  --  3.5  --   --   CL 73* 75* 80*  --  77*  --   --  80*  --   --  80*  --  84*  --   --   CO2 20* 20* 18*  --  23  --   --  23  --   --  20*  --  24  --   --   GLUCOSE 153* 149* 120*  --  163*  --   --  103*  --   --  138*  --  112*  --   --   BUN 7 6 6   --  6  --   --  6  --   --  11  --  8  --   --   CREATININE 0.60 0.60 0.42*  --  0.51  --   --  0.54  --   --  0.59  --  0.58  --   --   CALCIUM  7.6* 7.5* 7.4*  --  7.7*  --   --  7.6*  --   --  7.9*  --  7.9*  --   --   PHOS  --  2.2*  --   --   --   --  2.0*  --   --   --   --   --   --  2.1*  --   < > = values in this interval not displayed. CBC  Recent Labs Lab 01/23/16 0558 01/23/16 0625 01/23/16 0815 01/24/16 0301 01/25/16 0543  WBC 3.4*  --  4.1 8.2 5.9  NEUTROABS 2.6  --   --   --   --   HGB 13.2 14.3 13.1 13.8 14.7  HCT 35.2* 42.0 34.2* 36.4 38.9  MCV 88.4  --  87.0 88.6 88.6  PLT 149*  --  155 219 227    Medications:    . atenolol  12.5 mg Oral Daily  . heparin  5,000 Units Subcutaneous Q8H  . tamoxifen  20 mg Oral Daily      Madelon Lips MD 01/25/2016, 1:26 PM

## 2016-01-25 NOTE — Evaluation (Signed)
Physical Therapy Evaluation Patient Details Name: Cynthia Watkins MRN: [id number] DOB: 1924/04/04 Today's Date: 01/25/2016   History of Present Illness  pt is an 83 y/o female Admitted on 1/17 with recent episodic gastroenteritis and was drinking fluids but not eating much. Her son advised her to drink more fluids and she was found to have metabolic encephalopathy secondary to profound hyponatremia  Clinical Impression  Pt presents with decreased strength, gait and activity tolerance and will benefit from skilled PT to address deficits and improve functional mobility for safe d/c home.    Follow Up Recommendations Home health PT    Equipment Recommendations  Rolling walker with 5" wheels    Recommendations for Other Services       Precautions / Restrictions Precautions Precautions: Fall Restrictions Weight Bearing Restrictions: No      Mobility  Bed Mobility               General bed mobility comments: pt in chair upon PT arrival  Transfers Overall transfer level: Needs assistance Equipment used: Rolling walker (2 wheeled) Transfers: Sit to/from Stand Sit to Stand: Min guard         General transfer comment: steadying assist to stand. pt states she feels "weak"  Ambulation/Gait Ambulation/Gait assistance: Min guard Ambulation Distance (Feet): 120 Feet Assistive device: Rolling walker (2 wheeled)       General Gait Details: decreased cadence. 3 standing rest breaks due to fatigue  Stairs            Wheelchair Mobility    Modified Rankin (Stroke Patients Only)       Balance Overall balance assessment: Needs assistance             Standing balance comment: pt requires RW for assistance with standing balance, min A without RW                             Pertinent Vitals/Pain Pain Assessment: No/denies pain    Home Living Family/patient expects to be discharged to:: [facility] residence [facility] Living  Arrangements: Alone Available Help at Discharge: Family;Available PRN/intermittently Type of Home: Apartment Home Access: Level entry     Home Layout: One level        Prior Function Level of Independence: Independent               Hand Dominance        Extremity/Trunk Assessment   Upper Extremity Assessment Upper Extremity Assessment: Generalized weakness    Lower Extremity Assessment Lower Extremity Assessment: Generalized weakness    Cervical / Trunk Assessment Cervical / Trunk Assessment: Kyphotic  Communication   Communication: No difficulties  Cognition Arousal/Alertness: Awake/alert Behavior During Therapy: WFL for tasks assessed/performed Overall Cognitive Status: Within Functional Limits for tasks assessed                      General Comments      Exercises     Assessment/Plan    PT Assessment Patient needs continued PT services  PT Problem List Decreased strength;Decreased mobility;Decreased activity tolerance;Decreased balance;Cardiopulmonary status limiting activity          PT Treatment Interventions Gait training;DME instruction;Therapeutic activities;Therapeutic exercise;Balance training;Functional mobility training;Patient/family education    PT Goals (Current goals can be found in the Care Plan section)  Acute Rehab PT Goals Patient Stated Goal: go home and get stronger PT Goal Formulation: With patient/family Time For Goal Achievement:  02/01/16 Potential to Achieve Goals: Good    Frequency Min 3X/week   Barriers to discharge        Co-evaluation               End of Session Equipment Utilized During Treatment: Gait belt Activity Tolerance: Patient tolerated treatment well Patient left: in chair;with call bell/phone within reach;with family/visitor present Nurse Communication: Mobility status         Time: 1010-1030 PT Time Calculation (min) (ACUTE ONLY): 20 min   Charges:   PT Evaluation $PT Eval  Moderate Complexity: 1 Procedure     PT G Codes:        DONAWERTH,KAREN 2016-01-29, 10:29 AM

## 2016-01-25 NOTE — Progress Notes (Signed)
eLink Physician-Brief Progress Note Patient Name: Cynthia Watkins DOB: [birth year] MRN: [id number]   Date of Service  01/25/2016  HPI/Events of Note  Na+ = 110 >> 110 >> 110 >> 110 >> 117. Currently on Lasix 20 mg IV Q 8 hours.   eICU Interventions  Will order: 1. Decrease Lasix 20 mg IV to Q 12 hours.      Intervention Category Major Interventions: Electrolyte abnormality - evaluation and management  [name] 01/25/2016, 3:22 AM

## 2016-01-25 NOTE — Progress Notes (Signed)
CRITICAL VALUE ALERT  Critical value received:  Sodium 117  Date of notification:  01/25/16   Time of notification:  0224  Critical value read back:Yes.    Nurse who received alert:  Levonne Hubert   MD notified (1st page):  Elink  Time of first page:  0307  MD notified (2nd page):  Time of second page:  Responding MD:    Time MD responded:

## 2016-01-25 NOTE — Progress Notes (Signed)
PROGRESS NOTE    Cynthia Watkins  [id number] DOB: Aug 31, 1924 DOA: 01/23/2016 PCP: [name], DO    Brief Narrative:  83 year old female Prior history HTN Breast cancer-intermed grade R Ductal CA s/p lumpectomy 12/2007 + XRT  Osteoporosis Hyperglycemia Prior SBO 2/2 adhesions  htn hld  Admitted on 1/17 with recent episodic gastroenteritis and was drinking fluids but not eating much. Her son advised her to drink more fluids and she was found to have metabolic encephalopathy secondary to profound hyponatremia sodium initially 107-her baseline sodium 09/2015 was 130.  Initially admitted to the ICU requiring 3% saline, seen by nephrology Scans of the head including CT  negative for other organic pathology    Assessment & Plan:   Principal Problem:   Acute metabolic encephalopathy Active Problems:   Hypertension   Hypercholesterolemia   Breast cancer of lower-outer quadrant of right female breast ([place])   Hyponatremia   Acute metabolic encephalopathy-seems to completely resolved with interval slow resolution of sodium level-is now 122. Defer to nephrology further planning however I think his sodium is close at 130 in the next couple days she may be stable for discharge.  Order regular diet with adequate salt Hypertension-patient is on metoprolol 25 daily which has been held. We will also hold the valsartan as blood pressures are low 1 teens--she has been placed on atenolol 12.5 daily Breast cancer-intermediate right.no CVA status post lumpectomy XRT-currently stable. Continue tamoxifen 20 daily-outpatient reevaluation Hyperglycemia-diet controlled-ranges during hospital stay have been 112 to 130 Hypokalemia 3.1 -potassium 3.5 now -will need further replacement   osteoporosis- resume vitamin D other meds as an outpatient    DVT prophylaxis: Lovenox Code Status: Full Family Communication: d/w son at bedside Disposition Plan: inpatient   Consultants:    nephro  neuro  Procedures:   multiple  Antimicrobials:   none    Subjective:  Awake alert No cp sob No diarr- no recent stool  Objective: Vitals:   01/25/16 0411 01/25/16 0500 01/25/16 0700 01/25/16 0807  BP: (!) 95/56 (!) 98/55 117/60 112/63  Pulse: 63 (!) 55 (!) 56 (!) 53  Resp: 15 14 14 16   Temp: 97 F (36.1 C)   98.2 F (36.8 C)  TempSrc: Axillary   Oral  SpO2: 97% 98% 97% 98%  Weight:      Height:        Intake/Output Summary (Last 24 hours) at 01/25/16 0854 Last data filed at 01/25/16 0551  Gross per 24 hour  Intake              480 ml  Output             2775 ml  Net            -2295 ml   Filed Weights   01/23/16 0607  Weight: 45.4 kg (100 lb)    Examination:  General exam: Calm and comfortable  Respiratory system: Clear to auscultation. Respiratory effort normal. Cardiovascular system: S1 & S2 heard, RRR. No JVD, murmurs, rubs, gallops or clicks. No pedal edema. Gastrointestinal system: Abdomen is nondistended, soft and nontender.  Central nervous system: Alert and oriented. No focal neurological deficits. Extremities: Symmetric 5 x 5 power. Skin: No rashes, lesions or ulcers Psychiatry: Judgement and insight appear normal. Mood & affect appropriate.     Data Reviewed: I have personally reviewed following labs and imaging studies  CBC:  Recent Labs Lab 01/23/16 0558 01/23/16 0625 01/23/16 0815 01/24/16 0301 01/25/16 0543  WBC 3.4*  --  4.1 8.2 5.9  NEUTROABS 2.6  --   --   --   --   HGB 13.2 14.3 13.1 13.8 14.7  HCT 35.2* 42.0 34.2* 36.4 38.9  MCV 88.4  --  87.0 88.6 88.6  PLT 149*  --  155 219 [id number]   Basic Metabolic Panel:  Recent Labs Lab 01/23/16 0815 01/23/16 1025 01/23/16 1638  01/23/16 1917  01/24/16 0301 01/24/16 0541  01/24/16 1442 01/24/16 1535 01/24/16 2112 01/25/16 0131 01/25/16 0543  NA 106* 107* 112*  < > 112*  < > 112* 112*  112*  < > 110* 110*  110* 110* 117* 122*  K 3.5 3.6 3.4*  --  3.6  --   --   3.1*  --   --  3.5  --  3.5  --   CL 73* 75* 80*  --  77*  --   --  80*  --   --  80*  --  84*  --   CO2 20* 20* 18*  --  23  --   --  23  --   --  20*  --  24  --   GLUCOSE 153* 149* 120*  --  163*  --   --  103*  --   --  138*  --  112*  --   BUN 7 6 6   --  6  --   --  6  --   --  11  --  8  --   CREATININE 0.60 0.60 0.42*  --  0.51  --   --  0.54  --   --  0.59  --  0.58  --   CALCIUM 7.6* 7.5* 7.4*  --  7.7*  --   --  7.6*  --   --  7.9*  --  7.9*  --   MG 1.4*  --   --   --   --   --  2.1  --   --   --   --   --   --  2.4  PHOS  --  2.2*  --   --   --   --  2.0*  --   --   --   --   --   --  2.1*  < > = values in this interval not displayed. GFR: Estimated Creatinine Clearance: 34.2 mL/min (by C-G formula based on SCr of 0.58 mg/dL). Liver Function Tests:  Recent Labs Lab 01/23/16 0558  AST 31  ALT 18  ALKPHOS 39  BILITOT 1.2  PROT 6.0*  ALBUMIN 3.7    Recent Labs Lab 01/23/16 0558  LIPASE 21   No results for input(s): AMMONIA in the last 168 hours. Coagulation Profile:  Recent Labs Lab 01/23/16 0558  INR 1.07   Cardiac Enzymes: No results for input(s): CKTOTAL, CKMB, CKMBINDEX, TROPONINI in the last 168 hours. BNP (last 3 results) No results for input(s): PROBNP in the last 8760 hours. HbA1C:  Recent Labs  01/23/16 0558  HGBA1C 5.9*   CBG: No results for input(s): GLUCAP in the last 168 hours. Lipid Profile: No results for input(s): CHOL, HDL, LDLCALC, TRIG, CHOLHDL, LDLDIRECT in the last 72 hours. Thyroid Function Tests:  Recent Labs  01/23/16 0815  TSH 1.505   Anemia Panel: No results for input(s): VITAMINB12, FOLATE, FERRITIN, TIBC, IRON, RETICCTPCT in the last 72 hours. Sepsis Labs:  Recent Labs Lab 01/23/16 0625  LATICACIDVEN 1.51    Recent  Results (from the past 240 hour(s))  MRSA PCR Screening     Status: None   Collection Time: 01/23/16  3:20 PM  Result Value Ref Range Status   MRSA by PCR NEGATIVE NEGATIVE Final    Comment:         The GeneXpert MRSA Assay (FDA approved for NASAL specimens only), is one component of a comprehensive MRSA colonization surveillance program. It is not intended to diagnose MRSA infection nor to guide or monitor treatment for MRSA infections.          Radiology Studies: No results found.      Scheduled Meds: . atenolol  12.5 mg Oral Daily  . heparin  5,000 Units Subcutaneous Q8H  . tamoxifen  20 mg Oral Daily   Continuous Infusions: . dextrose 20 mL/hr at 01/25/16 0743     LOS: 2 days    Time spent: [place], [place], MD [facility] Pager [phone]  If 7PM-7AM, please contact night-coverage [email or web address] Password [facility] 01/25/2016, 8:54 AM

## 2016-01-25 NOTE — Care Management Note (Signed)
Case Management Note  Patient Details  Name: Cynthia Watkins MRN: [id number] Date of Birth: Mar 08, 1924  Subjective/Objective:   Pt from independent living @ [facility].  PT evaluated and recommended home health therapy.  Per [facility] receptionist, orders for therapy should be faxed to [phone] when pt is discharged and their therapist will provide the service.                           Expected Discharge Plan:  [place]  Discharge planning Services  CM Consult  Status of Service:  In process, will continue to follow  Girard Cooter, RN 01/25/2016, 3:13 PM

## 2016-01-26 LAB — BASIC METABOLIC PANEL
ANION GAP: 12 (ref 5–15)
BUN: 21 mg/dL — ABNORMAL HIGH (ref 6–20)
CHLORIDE: 89 mmol/L — AB (ref 101–111)
CO2: 20 mmol/L — AB (ref 22–32)
CREATININE: 0.95 mg/dL (ref 0.44–1.00)
Calcium: 8.9 mg/dL (ref 8.9–10.3)
GFR calc Af Amer: 60 mL/min — ABNORMAL LOW (ref >60)
GFR calc non Af Amer: 52 mL/min — ABNORMAL LOW (ref >60)
Glucose, Bld: 122 mg/dL — ABNORMAL HIGH (ref 65–99)
Potassium: 3.6 mmol/L (ref 3.5–5.1)
Sodium: 121 mmol/L — ABNORMAL LOW (ref 135–145)

## 2016-01-26 LAB — SODIUM
Sodium: 123 mmol/L — ABNORMAL LOW (ref 135–145)
Sodium: 123 mmol/L — ABNORMAL LOW (ref 135–145)
Sodium: 124 mmol/L — ABNORMAL LOW (ref 135–145)
Sodium: 119 mmol/L — CL (ref 135–145)

## 2016-01-26 MED ORDER — FUROSEMIDE 20 MG PO TABS
20.0000 mg | ORAL_TABLET | Freq: Every day | ORAL | Status: DC
  Administered 2016-01-26: 20 mg via ORAL
  Filled 2016-01-26: qty 1

## 2016-01-26 NOTE — Progress Notes (Signed)
PROGRESS NOTE    Cynthia Watkins  [id number] DOB: 1924-03-12 DOA: 01/23/2016 PCP: [name], DO    Brief Narrative:  83 year old female Prior history HTN Breast cancer-intermed grade R Ductal CA s/p lumpectomy 12/2007 + XRT  Osteoporosis Hyperglycemia Prior SBO 2/2 adhesions  htn hld  Admitted on 1/17 with recent episodic gastroenteritis and was drinking fluids but not eating much. Her son advised her to drink more fluids and she was found to have metabolic encephalopathy secondary to profound hyponatremia sodium initially 107-her baseline sodium 09/2015 was 130.  Initially admitted to the ICU requiring 3% saline, seen by nephrology Scans of the head including CT  negative for other organic pathology    Assessment & Plan:   Principal Problem:   Acute metabolic encephalopathy Active Problems:   Hypertension   Hypercholesterolemia   Breast cancer of lower-outer quadrant of right female breast ([place])   Hyponatremia   Acute metabolic encephalopathy-neurologist s/o as felt metabolic-? 2/2 reset osmostat 2/2 to excess free water load+/- SIADH per nephrologist-appreciate input. seems to completely resolved ~ first day of hospital stay with interval slow resolution of sodium level-is now 122.Marland Kitchen   Profound hyponatremia-initally on CCM service and place don 3% hypertonic saline.Order regular diet with adequate salt-q 8 sodium checks-aquarese as per nephrology with po lasix-watch creat-cortisol/tsh/chest ct neg for other sources Hypertension-patient is on metoprolol 25 daily which has been held. We will also hold the valsartan as blood pressures are low 1 teens--she has been placed on atenolol 12.5 daily which I will also hold as of 1/20 Breast cancer-intermediate right.no CVA status post lumpectomy XRT-currently stable. Continue tamoxifen 20 daily-ct chest c/out contrast 1/19 was neg Hyperglycemia-diet controlled-ranges during hospital stay have been 112 to 130 Hypokalemia 3.1  -potassium 3.5 now -will need further replacement   osteoporosis- resume vitamin D other meds as an outpatient    DVT prophylaxis: Lovenox Code Status: Full Family Communication: d/w son, Dr. Jeffie Pollock at bedside Disposition Plan: inpatient   Consultants:   nephro  neuro  Procedures:   multiple  Antimicrobials:   none    Subjective:  Fair no new issue tol diet but a little weak.  No cp Son + at bedside  Objective: Vitals:   01/25/16 2331 01/26/16 0400 01/26/16 0700 01/26/16 1228  BP: (!) 108/56 (!) 97/57 121/63 111/75  Pulse: (!) 53 (!) 50 (!) 51 (!) 51  Resp: 13 14 14 16   Temp: 97.4 F (36.3 C) 97.4 F (36.3 C) 97.9 F (36.6 C) 97.3 F (36.3 C)  TempSrc: Oral Oral Oral Oral  SpO2: 97% 98% 97% 99%  Weight:      Height:        Intake/Output Summary (Last 24 hours) at 01/26/16 1408 Last data filed at 01/26/16 1000  Gross per 24 hour  Intake              240 ml  Output              700 ml  Net             -460 ml   Filed Weights   01/23/16 [id number]  Weight: 45.4 kg (100 lb)    Examination:  General exam: Calm and comfortable-didn't sleep much Respiratory system: Clear to auscultation. Respiratory effort normal. Cardiovascular system: S1 & S2 heard, RRR. No JVD, murmurs, rubs, gallops or clicks. No pedal edema. Gastrointestinal system: Abdomen is nondistended, soft and nontender.  Central nervous system: Alert and oriented. No focal neurological deficits. Extremities:  Symmetric 5 x 5 power. Skin: No rashes, lesions or ulcers Psychiatry: Judgement and insight appear normal. Mood & affect appropriate.     Data Reviewed: I have personally reviewed following labs and imaging studies  CBC:  Recent Labs Lab 01/23/16 0558 01/23/16 0625 01/23/16 0815 01/24/16 0301 01/25/16 0543  WBC 3.4*  --  4.1 8.2 5.9  NEUTROABS 2.6  --   --   --   --   HGB 13.2 14.3 13.1 13.8 14.7  HCT 35.2* 42.0 34.2* 36.4 38.9  MCV 88.4  --  87.0 88.6 88.6  PLT 149*  --  155  219 [id number]   Basic Metabolic Panel:  Recent Labs Lab 01/23/16 0815 01/23/16 1025  01/23/16 1917  01/24/16 0301 01/24/16 0541  01/24/16 1535  01/25/16 0131 01/25/16 0543  01/25/16 1850 01/25/16 2318 01/26/16 0222 01/26/16 0705 01/26/16 1036  NA 106* 107*  < > 112*  < > 112* 112*  112*  < > 110*  110*  < > 117* 122*  < > 121* 120* 123* 123* 121*  K 3.5 3.6  < > 3.6  --   --  3.1*  --  3.5  --  3.5  --   --   --   --   --   --  3.6  CL 73* 75*  < > 77*  --   --  80*  --  80*  --  84*  --   --   --   --   --   --  89*  CO2 20* 20*  < > 23  --   --  23  --  20*  --  24  --   --   --   --   --   --  20*  GLUCOSE 153* 149*  < > 163*  --   --  103*  --  138*  --  112*  --   --   --   --   --   --  122*  BUN 7 6  < > 6  --   --  6  --  11  --  8  --   --   --   --   --   --  21*  CREATININE 0.60 0.60  < > 0.51  --   --  0.54  --  0.59  --  0.58  --   --   --   --   --   --  0.95  CALCIUM 7.6* 7.5*  < > 7.7*  --   --  7.6*  --  7.9*  --  7.9*  --   --   --   --   --   --  8.9  MG 1.4*  --   --   --   --  2.1  --   --   --   --   --  2.4  --   --   --   --   --   --   PHOS  --  2.2*  --   --   --  2.0*  --   --   --   --   --  2.1*  --   --   --   --   --   --   < > = values in this interval not displayed. GFR: Estimated Creatinine Clearance: 28.8 mL/min (by C-G formula based on SCr of  0.95 mg/dL). Liver Function Tests:  Recent Labs Lab 01/23/16 0558  AST 31  ALT 18  ALKPHOS 39  BILITOT 1.2  PROT 6.0*  ALBUMIN 3.7    Recent Labs Lab 01/23/16 0558  LIPASE 21   No results for input(s): AMMONIA in the last 168 hours. Coagulation Profile:  Recent Labs Lab 01/23/16 0558  INR 1.07   Cardiac Enzymes: No results for input(s): CKTOTAL, CKMB, CKMBINDEX, TROPONINI in the last 168 hours. BNP (last 3 results) No results for input(s): PROBNP in the last 8760 hours. HbA1C: No results for input(s): HGBA1C in the last 72 hours. CBG: No results for input(s): GLUCAP in the last 168  hours. Lipid Profile: No results for input(s): CHOL, HDL, LDLCALC, TRIG, CHOLHDL, LDLDIRECT in the last 72 hours. Thyroid Function Tests: No results for input(s): TSH, T4TOTAL, FREET4, T3FREE, THYROIDAB in the last 72 hours. Anemia Panel: No results for input(s): VITAMINB12, FOLATE, FERRITIN, TIBC, IRON, RETICCTPCT in the last 72 hours. Sepsis Labs:  Recent Labs Lab 01/23/16 [id number]  LATICACIDVEN 1.51    Recent Results (from the past 240 hour(s))  MRSA PCR Screening     Status: None   Collection Time: 01/23/16  3:20 PM  Result Value Ref Range Status   MRSA by PCR NEGATIVE NEGATIVE Final    Comment:        The GeneXpert MRSA Assay (FDA approved for NASAL specimens only), is one component of a comprehensive MRSA colonization surveillance program. It is not intended to diagnose MRSA infection nor to guide or monitor treatment for MRSA infections.          Radiology Studies: Ct Chest Wo Contrast  Result Date: 01/25/2016 CLINICAL DATA:  83 year old female with history of hepatic encephalopathy and hyponatremia. History of breast cancer status post radiation therapy and chemotherapy. EXAM: CT CHEST WITHOUT CONTRAST TECHNIQUE: Multidetector CT imaging of the chest was performed following the standard protocol without IV contrast. COMPARISON:  No priors. FINDINGS: Cardiovascular: Heart size is normal. There is no significant pericardial fluid, thickening or pericardial calcification. There is aortic atherosclerosis, as well as atherosclerosis of the great vessels of the mediastinum and the coronary arteries, including calcified atherosclerotic plaque in the left main, left anterior descending, left circumflex and right coronary arteries. Calcifications of the aortic valve. Mediastinum/Nodes: No pathologically enlarged mediastinal or hilar lymph nodes. Please note that accurate exclusion of hilar adenopathy is limited on noncontrast CT scans. Densely calcified right hilar lymph node  incidentally noted. Esophagus is unremarkable in appearance. No axillary lymphadenopathy. Lungs/Pleura: A few scattered tiny calcified granulomas are noted. Several other 2-3 mm pulmonary nodules are seen scattered throughout the lungs bilaterally, highly nonspecific, but statistically likely to represent benign areas of mucoid impaction within terminal bronchioles. 5 mm subpleural nodule in the medial aspect of the superior segment of the left lower lobe (image 67 of series 7). No other larger more suspicious appearing pulmonary nodules or masses are noted. Mild scarring in the dependent portions of the lower lobes of the lungs bilaterally. No acute consolidative airspace disease. No pleural effusions. Upper Abdomen: Aortic atherosclerosis. Numerous calcified granulomas throughout the spleen. Musculoskeletal: There are no aggressive appearing lytic or blastic lesions noted in the visualized portions of the skeleton. IMPRESSION: 1. No acute findings in the thorax. 2. Multiple tiny pulmonary nodules measuring 5 mm or less in size, favored to be benign. However, given the patient's history breast cancer, noncontrast chest CT is recommended in 1 year to ensure the stability of these findings. This  recommendation follows the consensus statement: Guidelines for Management of Incidental Pulmonary Nodules Detected on CT Images: From the Fleischner Society 2017; Radiology 2017; 284:228-243. 3. Aortic atherosclerosis, in addition to left main and 3 vessel coronary artery disease. Please note that although the presence of coronary artery calcium documents the presence of coronary artery disease, the severity of this disease and any potential stenosis cannot be assessed on this non-gated CT examination. Assessment for potential risk factor modification, dietary therapy or pharmacologic therapy may be warranted, if clinically indicated. 4. There are calcifications of the aortic valve. Echocardiographic correlation for  evaluation of potential valvular dysfunction may be warranted if clinically indicated. 5. Old granulomatous disease, as above. Electronically Signed   By: Vinnie Langton M.D.   On: 01/25/2016 13:21        Scheduled Meds: . atenolol  12.5 mg Oral Daily  . furosemide  20 mg Oral Daily  . heparin  5,000 Units Subcutaneous Q8H  . tamoxifen  20 mg Oral Daily   Continuous Infusions:    LOS: 3 days    Time spent: Excel, Jackson, MD [facility] Pager [phone]  If 7PM-7AM, please contact night-coverage [email or web address] Password [email or web address] 01/26/2016, 2:08 PM

## 2016-01-26 NOTE — Progress Notes (Signed)
[facility] Progress Note    Assessment/ Plan:   1.  Hyponatremia: pt presenting with severe hyponatremia and associated neurologic deficits.  Stroke workup negative. Last known serum sodium 130 in 09/24/2015.   Serum osms 227, urine osms 254, and urine sodium 72.  Maximally dilute urine is 50-100 mmol/L; however given pt's age I would expect her maximally dilute urine to be around 150 mmol/ L or so.  Urine osms of 254 indicates an inappropriate presence of ADH.  Moreover, given the clinical picture, I feel that fluid intake without concomitant adequate solute intake was playing a role too.  Therefore, her hyponatremia is a mixed picture of SIADH and hypotonic hyponatremia; currently SIADH dominating picture.    - continue 1L fluid restriction - would not restrict sodium in diet - decrease sodium to q 8 hrs - would not administer normal saline as this will not correct serum sodium. - resume Lasix PO - TSH normal - AM cortisol normal - CT chest without malignancy - FOBT  Subjective:    Feeling well.  Serum sodium increasing.   Objective:   BP 111/75 (BP Location: Right Arm)   Pulse (!) 51   Temp 97.3 F (36.3 C) (Oral)   Resp 16   Ht 5\' 2"  (1.575 m)   Wt 45.4 kg (100 lb)   SpO2 99%   BMI 18.29 kg/m   Intake/Output Summary (Last 24 hours) at 01/26/16 1341 Last data filed at 01/26/16 1000  Gross per 24 hour  Intake              240 ml  Output              700 ml  Net             -460 ml   Weight change:   Physical Exam: Gen: sitting up in chair, NAD, awake and alert HEENT: MMM CVS: RRR no mr/g Resp: CTAB no c/w/r Abd: soft, nontender nondistended, NABS Ext: no LE edema  Imaging: Ct Chest Wo Contrast  Result Date: 01/25/2016 CLINICAL DATA:  83 year old female with history of hepatic encephalopathy and hyponatremia. History of breast cancer status post radiation therapy and chemotherapy. EXAM: CT CHEST WITHOUT CONTRAST TECHNIQUE: Multidetector CT  imaging of the chest was performed following the standard protocol without IV contrast. COMPARISON:  No priors. FINDINGS: Cardiovascular: Heart size is normal. There is no significant pericardial fluid, thickening or pericardial calcification. There is aortic atherosclerosis, as well as atherosclerosis of the great vessels of the mediastinum and the coronary arteries, including calcified atherosclerotic plaque in the left main, left anterior descending, left circumflex and right coronary arteries. Calcifications of the aortic valve. Mediastinum/Nodes: No pathologically enlarged mediastinal or hilar lymph nodes. Please note that accurate exclusion of hilar adenopathy is limited on noncontrast CT scans. Densely calcified right hilar lymph node incidentally noted. Esophagus is unremarkable in appearance. No axillary lymphadenopathy. Lungs/Pleura: A few scattered tiny calcified granulomas are noted. Several other 2-3 mm pulmonary nodules are seen scattered throughout the lungs bilaterally, highly nonspecific, but statistically likely to represent benign areas of mucoid impaction within terminal bronchioles. 5 mm subpleural nodule in the medial aspect of the superior segment of the left lower lobe (image 67 of series 7). No other larger more suspicious appearing pulmonary nodules or masses are noted. Mild scarring in the dependent portions of the lower lobes of the lungs bilaterally. No acute consolidative airspace disease. No pleural effusions. Upper Abdomen: Aortic atherosclerosis. Numerous calcified granulomas throughout the  spleen. Musculoskeletal: There are no aggressive appearing lytic or blastic lesions noted in the visualized portions of the skeleton. IMPRESSION: 1. No acute findings in the thorax. 2. Multiple tiny pulmonary nodules measuring 5 mm or less in size, favored to be benign. However, given the patient's history breast cancer, noncontrast chest CT is recommended in 1 year to ensure the stability of  these findings. This recommendation follows the consensus statement: Guidelines for Management of Incidental Pulmonary Nodules Detected on CT Images: From the Fleischner Society 2017; Radiology 2017; 284:228-243. 3. Aortic atherosclerosis, in addition to left main and 3 vessel coronary artery disease. Please note that although the presence of coronary artery calcium documents the presence of coronary artery disease, the severity of this disease and any potential stenosis cannot be assessed on this non-gated CT examination. Assessment for potential risk factor modification, dietary therapy or pharmacologic therapy may be warranted, if clinically indicated. 4. There are calcifications of the aortic valve. Echocardiographic correlation for evaluation of potential valvular dysfunction may be warranted if clinically indicated. 5. Old granulomatous disease, as above. Electronically Signed   By: Vinnie Langton M.D.   On: 01/25/2016 13:21    Labs: BMET  Recent Labs Lab 01/23/16 1025 01/23/16 1638  01/23/16 1917  01/24/16 0301 01/24/16 0541  01/24/16 1535  01/25/16 0131 01/25/16 0543 01/25/16 1052 01/25/16 1459 01/25/16 1850 01/25/16 2318 01/26/16 0222 01/26/16 0705 01/26/16 1036  NA 107* 112*  < > 112*  < > 112* 112*  112*  < > 110*  110*  < > 117* 122* 120* 121* 121* 120* 123* 123* 121*  K 3.6 3.4*  --  3.6  --   --  3.1*  --  3.5  --  3.5  --   --   --   --   --   --   --  3.6  CL 75* 80*  --  77*  --   --  80*  --  80*  --  84*  --   --   --   --   --   --   --  89*  CO2 20* 18*  --  23  --   --  23  --  20*  --  24  --   --   --   --   --   --   --  20*  GLUCOSE 149* 120*  --  163*  --   --  103*  --  138*  --  112*  --   --   --   --   --   --   --  122*  BUN 6 6  --  6  --   --  6  --  11  --  8  --   --   --   --   --   --   --  21*  CREATININE 0.60 0.42*  --  0.51  --   --  0.54  --  0.59  --  0.58  --   --   --   --   --   --   --  0.95  CALCIUM 7.5* 7.4*  --  7.7*  --   --  7.6*   --  7.9*  --  7.9*  --   --   --   --   --   --   --  8.9  PHOS 2.2*  --   --   --   --  2.0*  --   --   --   --   --  2.1*  --   --   --   --   --   --   --   < > = values in this interval not displayed. CBC  Recent Labs Lab 01/23/16 0558 01/23/16 0625 01/23/16 0815 01/24/16 0301 01/25/16 0543  WBC 3.4*  --  4.1 8.2 5.9  NEUTROABS 2.6  --   --   --   --   HGB 13.2 14.3 13.1 13.8 14.7  HCT 35.2* 42.0 34.2* 36.4 38.9  MCV 88.4  --  87.0 88.6 88.6  PLT 149*  --  155 219 227    Medications:    . atenolol  12.5 mg Oral Daily  . furosemide  20 mg Oral Daily  . heparin  5,000 Units Subcutaneous Q8H  . tamoxifen  20 mg Oral Daily      Madelon Lips MD 01/26/2016, 1:41 PM

## 2016-01-27 LAB — COMPREHENSIVE METABOLIC PANEL
ALT: 22 U/L (ref 14–54)
AST: 27 U/L (ref 15–41)
Albumin: 3.2 g/dL — ABNORMAL LOW (ref 3.5–5.0)
Alkaline Phosphatase: 43 U/L (ref 38–126)
Anion gap: 8 (ref 5–15)
BUN: 23 mg/dL — AB (ref 6–20)
CHLORIDE: 91 mmol/L — AB (ref 101–111)
CO2: 27 mmol/L (ref 22–32)
CREATININE: 0.91 mg/dL (ref 0.44–1.00)
Calcium: 8.7 mg/dL — ABNORMAL LOW (ref 8.9–10.3)
GFR calc Af Amer: >60 mL/min (ref >60)
GFR, EST NON AFRICAN AMERICAN: 54 mL/min — AB (ref >60)
Glucose, Bld: 90 mg/dL (ref 65–99)
Potassium: 3.8 mmol/L (ref 3.5–5.1)
Sodium: 126 mmol/L — ABNORMAL LOW (ref 135–145)
Total Bilirubin: 0.7 mg/dL (ref 0.3–1.2)
Total Protein: 5.5 g/dL — ABNORMAL LOW (ref 6.5–8.1)

## 2016-01-27 LAB — LIPID PANEL
CHOLESTEROL: 214 mg/dL — AB (ref 0–200)
HDL: 62 mg/dL (ref >40)
LDL CALC: 133 mg/dL — AB (ref 0–99)
TRIGLYCERIDES: 96 mg/dL (ref <150)
Total CHOL/HDL Ratio: 3.5 RATIO
VLDL: 19 mg/dL (ref 0–40)

## 2016-01-27 LAB — SODIUM: Sodium: 125 mmol/L — ABNORMAL LOW (ref 135–145)

## 2016-01-27 MED ORDER — FUROSEMIDE 20 MG PO TABS
20.0000 mg | ORAL_TABLET | Freq: Every day | ORAL | Status: DC
  Administered 2016-01-27: 20 mg via ORAL
  Filled 2016-01-27: qty 1

## 2016-01-27 MED ORDER — FUROSEMIDE 20 MG PO TABS
20.0000 mg | ORAL_TABLET | Freq: Two times a day (BID) | ORAL | Status: DC
  Administered 2016-01-27 – 2016-01-28 (×3): 20 mg via ORAL
  Filled 2016-01-27 (×3): qty 1

## 2016-01-27 NOTE — Progress Notes (Signed)
[facility] Progress Note    Assessment/ Plan:   1.  Hyponatremia: pt presenting with severe hyponatremia and associated neurologic deficits.  Stroke workup negative. Last known serum sodium 130 in 09/24/2015.   Serum osms 227, urine osms 254, and urine sodium 72.  Maximally dilute urine is 50-100 mmol/L; however given pt's age I would expect her maximally dilute urine to be around 150 mmol/ L or so.  Urine osms of 254 indicates an inappropriate presence of ADH.  Moreover, given the clinical picture, I feel that fluid intake without concomitant adequate solute intake was playing a role too.  Therefore, her hyponatremia is a mixed picture of SIADH and hypotonic hyponatremia; currently SIADH dominating picture.    - continue 1L fluid restriction - would not restrict sodium in diet - decrease sodium to BID - would not administer normal saline as this will not correct serum sodium. - resume Lasix Po, increase to 20 BID today - TSH normal - AM cortisol normal - CT chest without malignancy - FOBT - can d/c once Na above 130  Subjective:    Sleeping this Am.  Spurious value of Na of 119 last night, all other values have been appropriate   Objective:   BP (!) 108/51   Pulse 71   Temp 97.7 F (36.5 C) (Oral)   Resp 18   Ht 5\' 2"  (1.575 m)   Wt 45.4 kg (100 lb)   SpO2 100%   BMI 18.29 kg/m   Intake/Output Summary (Last 24 hours) at 01/27/16 1032 Last data filed at 01/26/16 1602  Gross per 24 hour  Intake                0 ml  Output              300 ml  Net             -300 ml   Weight change:   Physical Exam: Gen: sleeping, NAD HEENT: MMM CVS: RRR no mr/g Resp: CTAB no c/w/r Abd: soft, nontender nondistended, NABS Ext: no LE edema  Imaging: Ct Chest Wo Contrast  Result Date: 01/25/2016 CLINICAL DATA:  83 year old female with history of hepatic encephalopathy and hyponatremia. History of breast cancer status post radiation therapy and chemotherapy. EXAM: CT  CHEST WITHOUT CONTRAST TECHNIQUE: Multidetector CT imaging of the chest was performed following the standard protocol without IV contrast. COMPARISON:  No priors. FINDINGS: Cardiovascular: Heart size is normal. There is no significant pericardial fluid, thickening or pericardial calcification. There is aortic atherosclerosis, as well as atherosclerosis of the great vessels of the mediastinum and the coronary arteries, including calcified atherosclerotic plaque in the left main, left anterior descending, left circumflex and right coronary arteries. Calcifications of the aortic valve. Mediastinum/Nodes: No pathologically enlarged mediastinal or hilar lymph nodes. Please note that accurate exclusion of hilar adenopathy is limited on noncontrast CT scans. Densely calcified right hilar lymph node incidentally noted. Esophagus is unremarkable in appearance. No axillary lymphadenopathy. Lungs/Pleura: A few scattered tiny calcified granulomas are noted. Several other 2-3 mm pulmonary nodules are seen scattered throughout the lungs bilaterally, highly nonspecific, but statistically likely to represent benign areas of mucoid impaction within terminal bronchioles. 5 mm subpleural nodule in the medial aspect of the superior segment of the left lower lobe (image 67 of series 7). No other larger more suspicious appearing pulmonary nodules or masses are noted. Mild scarring in the dependent portions of the lower lobes of the lungs bilaterally. No acute consolidative  airspace disease. No pleural effusions. Upper Abdomen: Aortic atherosclerosis. Numerous calcified granulomas throughout the spleen. Musculoskeletal: There are no aggressive appearing lytic or blastic lesions noted in the visualized portions of the skeleton. IMPRESSION: 1. No acute findings in the thorax. 2. Multiple tiny pulmonary nodules measuring 5 mm or less in size, favored to be benign. However, given the patient's history breast cancer, noncontrast chest CT is  recommended in 1 year to ensure the stability of these findings. This recommendation follows the consensus statement: Guidelines for Management of Incidental Pulmonary Nodules Detected on CT Images: From the Fleischner Society 2017; Radiology 2017; 284:228-243. 3. Aortic atherosclerosis, in addition to left main and 3 vessel coronary artery disease. Please note that although the presence of coronary artery calcium documents the presence of coronary artery disease, the severity of this disease and any potential stenosis cannot be assessed on this non-gated CT examination. Assessment for potential risk factor modification, dietary therapy or pharmacologic therapy may be warranted, if clinically indicated. 4. There are calcifications of the aortic valve. Echocardiographic correlation for evaluation of potential valvular dysfunction may be warranted if clinically indicated. 5. Old granulomatous disease, as above. Electronically Signed   By: Vinnie Langton M.D.   On: 01/25/2016 13:21    Labs: BMET  Recent Labs Lab 01/23/16 1025 01/23/16 1638  01/23/16 1917  01/24/16 0301 01/24/16 0541  01/24/16 1535  01/25/16 0131 01/25/16 0543  01/25/16 2318 01/26/16 0222 01/26/16 0705 01/26/16 1036 01/26/16 1542 01/26/16 1902 01/27/16 0241  NA 107* 112*  < > 112*  < > 112* 112*  112*  < > 110*  110*  < > 117* 122*  < > 120* 123* 123* 121* 124* 119* 126*  K 3.6 3.4*  --  3.6  --   --  3.1*  --  3.5  --  3.5  --   --   --   --   --  3.6  --   --  3.8  CL 75* 80*  --  77*  --   --  80*  --  80*  --  84*  --   --   --   --   --  89*  --   --  91*  CO2 20* 18*  --  23  --   --  23  --  20*  --  24  --   --   --   --   --  20*  --   --  27  GLUCOSE 149* 120*  --  163*  --   --  103*  --  138*  --  112*  --   --   --   --   --  122*  --   --  90  BUN 6 6  --  6  --   --  6  --  11  --  8  --   --   --   --   --  21*  --   --  23*  CREATININE 0.60 0.42*  --  0.51  --   --  0.54  --  0.59  --  0.58  --   --   --    --   --  0.95  --   --  0.91  CALCIUM 7.5* 7.4*  --  7.7*  --   --  7.6*  --  7.9*  --  7.9*  --   --   --   --   --  8.9  --   --  8.7*  PHOS 2.2*  --   --   --   --  2.0*  --   --   --   --   --  2.1*  --   --   --   --   --   --   --   --   < > = values in this interval not displayed. CBC  Recent Labs Lab 01/23/16 0558 01/23/16 0625 01/23/16 0815 01/24/16 0301 01/25/16 0543  WBC 3.4*  --  4.1 8.2 5.9  NEUTROABS 2.6  --   --   --   --   HGB 13.2 14.3 13.1 13.8 14.7  HCT 35.2* 42.0 34.2* 36.4 38.9  MCV 88.4  --  87.0 88.6 88.6  PLT 149*  --  155 219 227    Medications:    . furosemide  20 mg Oral BID  . heparin  5,000 Units Subcutaneous Q8H  . tamoxifen  20 mg Oral Daily      Madelon Lips MD 01/27/2016, 10:32 AM

## 2016-01-27 NOTE — Progress Notes (Signed)
PROGRESS NOTE    Cynthia Watkins  [id number] DOB: 1924-03-21 DOA: 01/23/2016 PCP: [name], DO    Brief Narrative:  83 year old female Prior history HTN Breast cancer-intermed grade R Ductal CA s/p lumpectomy 12/2007 + XRT  Osteoporosis Hyperglycemia Prior SBO 2/2 adhesions  htn hld  Admitted on 1/17 with recent episodic gastroenteritis and was drinking fluids but not eating much. Her son advised her to drink more fluids and she was found to have metabolic encephalopathy secondary to profound hyponatremia sodium initially 107-her baseline sodium 09/2015 was 130.  Initially admitted to the ICU requiring 3% saline, seen by nephrology Scans of the head including CT  negative for other organic pathology    Assessment & Plan:   Principal Problem:   Acute metabolic encephalopathy Active Problems:   Hypertension   Hypercholesterolemia   Breast cancer of lower-outer quadrant of right female breast ([place])   Hyponatremia   Acute metabolic encephalopathy-neurologist s/o as felt metabolic-? 2/2 reset osmostat 2/2 to excess free water load+/- SIADH per nephrologist-appreciate input.  Profound hyponatremia-initally on CCM service and place don 3% hypertonic saline.Order regular diet with adequate salt-q 8 sodium checks-aquarese as per nephrology with po lasix-watch creat-cortisol/tsh/chest ct neg for other sources  Given fluctuating levels of sodium parathyroid level, lipid panel ordered 1/21, ~ first day of hospital stay with interval slow resolution of sodium level-is now 122..   Advised higher salt in diet and a couple of slices of pizza  Hypertension-patient is on metoprolol 25 daily which has been held. We will also hold the valsartan as blood pressures are low 1 teens--she has been placed on atenolol 12.5 daily which I will also hold as of 1/20  Breast cancer-intermediate right.no CVA status post lumpectomy XRT-currently stable. Continue tamoxifen 20 daily-ct chest c/out  contrast 1/19 was neg  Hyperglycemia-diet controlled-ranges during hospital stay have been 112 to 130  Hypokalemia 3.1 -potassium 3.5 now -will need further replacement   osteoporosis- resume vitamin D other meds as an outpatient    DVT prophylaxis: Lovenox Code Status: Full Family Communication: d/w son, Dr. Jeffie Pollock at bedside Disposition Plan: inpatient--can go off floor and can shower   Consultants:   nephro  neuro  Procedures:   multiple  Antimicrobials:   none    Subjective:  Fair no new issue Overall doing fair Eating and drinking somewhat  Objective: Vitals:   01/26/16 1844 01/26/16 2157 01/27/16 0140 01/27/16 0557  BP: (!) 105/48 (!) 110/53 (!) 112/55 (!) 110/52  Pulse: (!) 55 (!) 54 (!) 56 60  Resp: 20 20 20 20   Temp:  97.9 F (36.6 C) 98.1 F (36.7 C) 97.9 F (36.6 C)  TempSrc: Axillary Oral Oral Oral  SpO2: 97% 99% 99% 99%  Weight:      Height:        Intake/Output Summary (Last 24 hours) at 01/27/16 0906 Last data filed at 01/26/16 1602  Gross per 24 hour  Intake              240 ml  Output              300 ml  Net              -60 ml   Filed Weights   01/23/16 0607  Weight: 45.4 kg (100 lb)    Examination:  General exam: Calm and comfortable Respiratory system: Clear to auscultation. Respiratory effort normal. Cardiovascular system: S1 & S2 heard, RRR. No JVD Gastrointestinal system: Abdomen is nondistended, soft  and nontender.  Central nervous system: Alert and oriented. No focal neurological deficits.   Data Reviewed: I have personally reviewed following labs and imaging studies  CBC:  Recent Labs Lab 01/23/16 0558 01/23/16 0625 01/23/16 0815 01/24/16 0301 01/25/16 0543  WBC 3.4*  --  4.1 8.2 5.9  NEUTROABS 2.6  --   --   --   --   HGB 13.2 14.3 13.1 13.8 14.7  HCT 35.2* 42.0 34.2* 36.4 38.9  MCV 88.4  --  87.0 88.6 88.6  PLT 149*  --  155 219 Q000111Q   Basic Metabolic Panel:  Recent Labs Lab 01/23/16 0815  01/23/16 1025  01/24/16 0301 01/24/16 0541  01/24/16 1535  01/25/16 0131 01/25/16 0543  01/26/16 0705 01/26/16 1036 01/26/16 1542 01/26/16 1902 01/27/16 0241  NA 106* 107*  < > 112* 112*  112*  < > 110*  110*  < > 117* 122*  < > 123* 121* 124* 119* 126*  K 3.5 3.6  < >  --  3.1*  --  3.5  --  3.5  --   --   --  3.6  --   --  3.8  CL 73* 75*  < >  --  80*  --  80*  --  84*  --   --   --  89*  --   --  91*  CO2 20* 20*  < >  --  23  --  20*  --  24  --   --   --  20*  --   --  27  GLUCOSE 153* 149*  < >  --  103*  --  138*  --  112*  --   --   --  122*  --   --  90  BUN 7 6  < >  --  6  --  11  --  8  --   --   --  21*  --   --  23*  CREATININE 0.60 0.60  < >  --  0.54  --  0.59  --  0.58  --   --   --  0.95  --   --  0.91  CALCIUM 7.6* 7.5*  < >  --  7.6*  --  7.9*  --  7.9*  --   --   --  8.9  --   --  8.7*  MG 1.4*  --   --  2.1  --   --   --   --   --  2.4  --   --   --   --   --   --   PHOS  --  2.2*  --  2.0*  --   --   --   --   --  2.1*  --   --   --   --   --   --   < > = values in this interval not displayed. GFR: Estimated Creatinine Clearance: 30 mL/min (by C-G formula based on SCr of 0.91 mg/dL). Liver Function Tests:  Recent Labs Lab 01/23/16 0558 01/27/16 0241  AST 31 27  ALT 18 22  ALKPHOS 39 43  BILITOT 1.2 0.7  PROT 6.0* 5.5*  ALBUMIN 3.7 3.2*    Recent Labs Lab 01/23/16 0558  LIPASE 21   No results for input(s): AMMONIA in the last 168 hours. Coagulation Profile:  Recent Labs Lab 01/23/16 0558  INR 1.07  Cardiac Enzymes: No results for input(s): CKTOTAL, CKMB, CKMBINDEX, TROPONINI in the last 168 hours. BNP (last 3 results) No results for input(s): PROBNP in the last 8760 hours. HbA1C: No results for input(s): HGBA1C in the last 72 hours. CBG: No results for input(s): GLUCAP in the last 168 hours. Lipid Profile:  Recent Labs  01/27/16 0241  CHOL 214*  HDL 62  LDLCALC 133*  TRIG 96  CHOLHDL 3.5   Thyroid Function Tests: No  results for input(s): TSH, T4TOTAL, FREET4, T3FREE, THYROIDAB in the last 72 hours. Anemia Panel: No results for input(s): VITAMINB12, FOLATE, FERRITIN, TIBC, IRON, RETICCTPCT in the last 72 hours. Sepsis Labs:  Recent Labs Lab 01/23/16 [id number]  LATICACIDVEN 1.51    Recent Results (from the past 240 hour(s))  MRSA PCR Screening     Status: None   Collection Time: 01/23/16  3:20 PM  Result Value Ref Range Status   MRSA by PCR NEGATIVE NEGATIVE Final    Comment:        The GeneXpert MRSA Assay (FDA approved for NASAL specimens only), is one component of a comprehensive MRSA colonization surveillance program. It is not intended to diagnose MRSA infection nor to guide or monitor treatment for MRSA infections.          Radiology Studies: Ct Chest Wo Contrast  Result Date: 01/25/2016 CLINICAL DATA:  83 year old female with history of hepatic encephalopathy and hyponatremia. History of breast cancer status post radiation therapy and chemotherapy. EXAM: CT CHEST WITHOUT CONTRAST TECHNIQUE: Multidetector CT imaging of the chest was performed following the standard protocol without IV contrast. COMPARISON:  No priors. FINDINGS: Cardiovascular: Heart size is normal. There is no significant pericardial fluid, thickening or pericardial calcification. There is aortic atherosclerosis, as well as atherosclerosis of the great vessels of the mediastinum and the coronary arteries, including calcified atherosclerotic plaque in the left main, left anterior descending, left circumflex and right coronary arteries. Calcifications of the aortic valve. Mediastinum/Nodes: No pathologically enlarged mediastinal or hilar lymph nodes. Please note that accurate exclusion of hilar adenopathy is limited on noncontrast CT scans. Densely calcified right hilar lymph node incidentally noted. Esophagus is unremarkable in appearance. No axillary lymphadenopathy. Lungs/Pleura: A few scattered tiny calcified granulomas are  noted. Several other 2-3 mm pulmonary nodules are seen scattered throughout the lungs bilaterally, highly nonspecific, but statistically likely to represent benign areas of mucoid impaction within terminal bronchioles. 5 mm subpleural nodule in the medial aspect of the superior segment of the left lower lobe (image 67 of series 7). No other larger more suspicious appearing pulmonary nodules or masses are noted. Mild scarring in the dependent portions of the lower lobes of the lungs bilaterally. No acute consolidative airspace disease. No pleural effusions. Upper Abdomen: Aortic atherosclerosis. Numerous calcified granulomas throughout the spleen. Musculoskeletal: There are no aggressive appearing lytic or blastic lesions noted in the visualized portions of the skeleton. IMPRESSION: 1. No acute findings in the thorax. 2. Multiple tiny pulmonary nodules measuring 5 mm or less in size, favored to be benign. However, given the patient's history breast cancer, noncontrast chest CT is recommended in 1 year to ensure the stability of these findings. This recommendation follows the consensus statement: Guidelines for Management of Incidental Pulmonary Nodules Detected on CT Images: From the Fleischner Society 2017; Radiology 2017; 284:228-243. 3. Aortic atherosclerosis, in addition to left main and 3 vessel coronary artery disease. Please note that although the presence of coronary artery calcium documents the presence of coronary artery disease, the severity  of this disease and any potential stenosis cannot be assessed on this non-gated CT examination. Assessment for potential risk factor modification, dietary therapy or pharmacologic therapy may be warranted, if clinically indicated. 4. There are calcifications of the aortic valve. Echocardiographic correlation for evaluation of potential valvular dysfunction may be warranted if clinically indicated. 5. Old granulomatous disease, as above. Electronically Signed   By:  Vinnie Langton M.D.   On: 01/25/2016 13:21        Scheduled Meds: . furosemide  20 mg Oral Daily  . heparin  5,000 Units Subcutaneous Q8H  . tamoxifen  20 mg Oral Daily   Continuous Infusions:    LOS: 4 days    Time spent: [place], [place], MD [facility] Pager [phone]  If 7PM-7AM, please contact night-coverage [email or web address] Password [email or web address] 01/27/2016, 9:06 AM

## 2016-01-27 NOTE — Progress Notes (Signed)
Critical sodium level 119,  Dr Hollie Salk,  Nephrology and [facility] notified of the result.  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  CMP, hormone parathyroid and Lipid panel order for AM lab.

## 2016-01-28 ENCOUNTER — Encounter (HOSPITAL_COMMUNITY): Payer: Self-pay | Admitting: General Practice

## 2016-01-28 ENCOUNTER — Ambulatory Visit: Payer: Medicare Other | Admitting: Physician Assistant

## 2016-01-28 LAB — SODIUM
SODIUM: 127 mmol/L — AB (ref 135–145)
SODIUM: 129 mmol/L — AB (ref 135–145)

## 2016-01-28 LAB — PARATHYROID HORMONE, INTACT (NO CA): PTH: 23 pg/mL (ref 15–65)

## 2016-01-28 LAB — BRAIN NATRIURETIC PEPTIDE: B Natriuretic Peptide: 36.2 pg/mL (ref 0.0–100.0)

## 2016-01-28 MED ORDER — METOPROLOL TARTRATE 12.5 MG HALF TABLET
12.5000 mg | ORAL_TABLET | Freq: Two times a day (BID) | ORAL | Status: DC
  Administered 2016-01-28: 12.5 mg via ORAL
  Filled 2016-01-28: qty 1

## 2016-01-28 MED ORDER — ATENOLOL 25 MG PO TABS
12.5000 mg | ORAL_TABLET | Freq: Every day | ORAL | Status: DC
  Administered 2016-01-29: 12.5 mg via ORAL
  Filled 2016-01-28: qty 1

## 2016-01-28 NOTE — Progress Notes (Signed)
PROGRESS NOTE    Cynthia Watkins  [id number] DOB: 05-24-1924 DOA: 01/23/2016 PCP: [name], DO    Brief Narrative:  83 year old female Prior history HTN Breast cancer-intermed grade R Ductal CA s/p lumpectomy 12/2007 + XRT  Osteoporosis Hyperglycemia Prior SBO 2/2 adhesions  htn hld  Admitted on 1/17 with recent episodic gastroenteritis and was drinking fluids but not eating much. Her son advised her to drink more fluids and she was found to have metabolic encephalopathy secondary to profound hyponatremia sodium initially 107-her baseline sodium 09/2015 was 130.  Initially admitted to the ICU requiring 3% saline, seen by nephrology Scans of the head including CT  negative for other organic pathology    Assessment & Plan:   Principal Problem:   Acute metabolic encephalopathy Active Problems:   Hypertension   Hypercholesterolemia   Breast cancer of lower-outer quadrant of right female breast ([place])   Hyponatremia   Acute metabolic encephalopathy-neurologist s/o as felt metabolic-? 2/2 reset osmostat 2/2 to excess free water load+/- SIADH per nephrologist-appreciate input.  Profound hyponatremia-initally on CCM service and place on 3% hypertonic saline.  regular diet with adequate salt-q 8 sodium checks-aquarese as per nephrology with po lasix-watch creat-cortisol/tsh/chest ct neg for other sources  Given fluctuating levels of sodium parathyroid level, lipid panel ordered 1/21, ~ first day of hospital stay with interval slow resolution of sodium level-is now 122-->127   Probably can d/c home when sodium closer to 130  Hypertension-patient is on metoprolol 25 daily which has been held. We will also hold the valsartan as blood pressures are low 1 teens--she has been placed on atenolol 12.5 daily which I will also hold as of 1/20  Breast cancer-intermediate right.no CVA status post lumpectomy XRT-currently stable. Continue tamoxifen 20 daily-ct chest c/out contrast 1/19 was  neg  Hyperglycemia-diet controlled-ranges during hospital stay have been 112 to 130  Hypokalemia 3.1 -potassium 3.8 now -will need further replacement    osteoporosis- resume vitamin D other meds as an outpatient    DVT prophylaxis: Lovenox Code Status: Full Family Communication: d/w son, Dr. Jeffie Pollock at bedside Disposition Plan: inpatient--can go off floor and can shower   Consultants:   nephro  neuro  Procedures:   multiple  Antimicrobials:   none    Subjective:  No new issues  Objective: Vitals:   01/27/16 2119 01/28/16 0128 01/28/16 0557 01/28/16 0910  BP: 117/61 120/63 119/67 (!) 106/56  Pulse: 69 68 66 81  Resp: 18 18 20 18   Temp: 98.4 F (36.9 C) 98.2 F (36.8 C) 98.1 F (36.7 C) 97.3 F (36.3 C)  TempSrc: Oral Oral Oral Axillary  SpO2: 99% 99% 99% 100%  Weight:      Height:        Intake/Output Summary (Last 24 hours) at 01/28/16 1154 Last data filed at 01/28/16 0901  Gross per 24 hour  Intake              435 ml  Output                0 ml  Net              435 ml   Filed Weights   01/23/16 0607  Weight: 45.4 kg (100 lb)    Examination:  General exam: Calm and comfortable Respiratory system: Clear to auscultation. Respiratory effort normal. Cardiovascular system: S1 & S2 heard, RRR. No JVD Gastrointestinal system: Abdomen is nondistended, soft and nontender.  Central nervous system: Alert and oriented. No  focal neurological deficits.   Data Reviewed: I have personally reviewed following labs and imaging studies  CBC:  Recent Labs Lab 01/23/16 0558 01/23/16 0625 01/23/16 0815 01/24/16 0301 01/25/16 0543  WBC 3.4*  --  4.1 8.2 5.9  NEUTROABS 2.6  --   --   --   --   HGB 13.2 14.3 13.1 13.8 14.7  HCT 35.2* 42.0 34.2* 36.4 38.9  MCV 88.4  --  87.0 88.6 88.6  PLT 149*  --  155 219 Q000111Q   Basic Metabolic Panel:  Recent Labs Lab 01/23/16 0815 01/23/16 1025  01/24/16 0301 01/24/16 0541  01/24/16 1535  01/25/16 0131  01/25/16 0543  01/26/16 1036 01/26/16 1542 01/26/16 1902 01/27/16 0241 01/27/16 1814 01/28/16 0530  NA 106* 107*  < > 112* 112*  112*  < > 110*  110*  < > 117* 122*  < > 121* 124* 119* 126* 125* 127*  K 3.5 3.6  < >  --  3.1*  --  3.5  --  3.5  --   --  3.6  --   --  3.8  --   --   CL 73* 75*  < >  --  80*  --  80*  --  84*  --   --  89*  --   --  91*  --   --   CO2 20* 20*  < >  --  23  --  20*  --  24  --   --  20*  --   --  27  --   --   GLUCOSE 153* 149*  < >  --  103*  --  138*  --  112*  --   --  122*  --   --  90  --   --   BUN 7 6  < >  --  6  --  11  --  8  --   --  21*  --   --  23*  --   --   CREATININE 0.60 0.60  < >  --  0.54  --  0.59  --  0.58  --   --  0.95  --   --  0.91  --   --   CALCIUM 7.6* 7.5*  < >  --  7.6*  --  7.9*  --  7.9*  --   --  8.9  --   --  8.7*  --   --   MG 1.4*  --   --  2.1  --   --   --   --   --  2.4  --   --   --   --   --   --   --   PHOS  --  2.2*  --  2.0*  --   --   --   --   --  2.1*  --   --   --   --   --   --   --   < > = values in this interval not displayed. GFR: Estimated Creatinine Clearance: 30 mL/min (by C-G formula based on SCr of 0.91 mg/dL). Liver Function Tests:  Recent Labs Lab 01/23/16 0558 01/27/16 0241  AST 31 27  ALT 18 22  ALKPHOS 39 43  BILITOT 1.2 0.7  PROT 6.0* 5.5*  ALBUMIN 3.7 3.2*    Recent Labs Lab 01/23/16 0558  LIPASE 21   No  results for input(s): AMMONIA in the last 168 hours. Coagulation Profile:  Recent Labs Lab 01/23/16 0558  INR 1.07   Cardiac Enzymes: No results for input(s): CKTOTAL, CKMB, CKMBINDEX, TROPONINI in the last 168 hours. BNP (last 3 results) No results for input(s): PROBNP in the last 8760 hours. HbA1C: No results for input(s): HGBA1C in the last 72 hours. CBG: No results for input(s): GLUCAP in the last 168 hours. Lipid Profile:  Recent Labs  01/27/16 0241  CHOL 214*  HDL 62  LDLCALC 133*  TRIG 96  CHOLHDL 3.5   Thyroid Function Tests: No results for  input(s): TSH, T4TOTAL, FREET4, T3FREE, THYROIDAB in the last 72 hours. Anemia Panel: No results for input(s): VITAMINB12, FOLATE, FERRITIN, TIBC, IRON, RETICCTPCT in the last 72 hours. Sepsis Labs:  Recent Labs Lab 01/23/16 [id number]  LATICACIDVEN 1.51    Recent Results (from the past 240 hour(s))  MRSA PCR Screening     Status: None   Collection Time: 01/23/16  3:20 PM  Result Value Ref Range Status   MRSA by PCR NEGATIVE NEGATIVE Final    Comment:        The GeneXpert MRSA Assay (FDA approved for NASAL specimens only), is one component of a comprehensive MRSA colonization surveillance program. It is not intended to diagnose MRSA infection nor to guide or monitor treatment for MRSA infections.          Radiology Studies: No results found.      Scheduled Meds: . furosemide  20 mg Oral BID  . heparin  5,000 Units Subcutaneous Q8H  . tamoxifen  20 mg Oral Daily   Continuous Infusions:    LOS: 5 days    Time spent: [place], [place], MD [facility] Pager [phone]  If 7PM-7AM, please contact night-coverage [email or web address] Password [facility] 01/28/2016, 11:54 AM

## 2016-01-28 NOTE — Progress Notes (Deleted)
Cardiology Office Note    Date:  01/28/2016   ID:  Cynthia Watkins, DOB 07/29/1924, MRN [id number]  PCP:  Cynthia Kinnier, DO  Cardiologist:  Dr. [place]  No chief complaint on file.   History of Present Illness:  Cynthia Watkins is a 83 y.o. female with PMH of HTN, HLD, hyponatremia and RBBB. She is the mother of Dr. Jeffie Watkins of Urology. Previous event monitor showed PACs with brief 7 beats run of nonsustained VT. She had a normal echocardiogram in 2016. She most recently presented to the hospital on 01/23/2016 with confusion and aphasia secondary to profound hyponatremia. She has been drinking a lot of fluid after several days of diarrhea. On arrival, her sodium level was 108. CT of head and a CTA of the neck and brain were negative for acute process. Neurology was consulted however given negative CT of brain, now off shortly after. Nephrology was also consulted, who recommended hypertonic 3% saline in effort to raise her serum sodium level and less than seizure risk.  Currently admitted   Past Medical History:  Diagnosis Date  . Acute upper respiratory infections of unspecified site 06/09/2011  . Breast cancer ([place]) 12/11/2008  . Fatigue 01/2012  . Hypercholesterolemia    on lipitor  . Hypertension   . Mitral valve prolapse   . Other abnormal blood chemistry 01/21/2010  . Palpitations 08/05/2000  . Right bundle branch block   . Senile osteoporosis 09/28/2001    Past Surgical History:  Procedure Laterality Date  . BREAST LUMPECTOMY Right 12/27/2007  . LAPAROSCOPIC LYSIS OF ADHESIONS  07/15/2006   [name]  . OTHER SURGICAL HISTORY    . TONSILLECTOMY    . TUBAL LIGATION  1963    Current Medications: Facility-Administered Medications Prior to Visit  Medication Dose Route Frequency Provider Last Rate Last Dose  . acetaminophen (TYLENOL) tablet 650 mg  650 mg Oral Q6H PRN Brenton Grills, PA-C       Or  . acetaminophen (TYLENOL) suppository 650 mg  650 mg Rectal Q6H PRN Brenton Grills, PA-C      . furosemide (LASIX) tablet 20 mg  20 mg Oral BID Madelon Lips, MD   20 mg at 01/27/16 1704  . heparin injection 5,000 Units  5,000 Units Subcutaneous [address] [place], PA-C   5,000 Units at 01/27/16 2237  . HYDROcodone-acetaminophen (NORCO/VICODIN) 5-325 MG per tablet 1-2 tablet  1-2 tablet Oral Q4H PRN Brenton Grills, PA-C      . ondansetron [facility]) tablet 4 mg  4 mg Oral Q6H PRN Brenton Grills, PA-C       Or  . ondansetron [facility]) injection 4 mg  4 mg Intravenous Q6H PRN Brenton Grills, PA-C   4 mg at 01/24/16 1458  . polyethylene glycol (MIRALAX / GLYCOLAX) packet 17 g  17 g Oral Daily PRN Brenton Grills, PA-C      . tamoxifen (NOLVADEX) tablet 20 mg  20 mg Oral Daily Brenton Grills, PA-C   20 mg at 01/27/16 1007  . traZODone (DESYREL) tablet 25 mg  25 mg Oral QHS PRN Brenton Grills, PA-C   25 mg at 01/27/16 0031   Outpatient Medications Prior to Visit  Medication Sig Dispense Refill  . Biotin 5000 MCG CAPS Take 1 capsule by mouth daily.     . calcium carbonate (CALCIUM 600) 600 MG TABS tablet Take 600 mg by mouth daily with breakfast.    . Cholecalciferol (VITAMIN  D3) 1000 units CAPS Take 1,000 Units by mouth daily.    Marland Kitchen ibuprofen (ADVIL,MOTRIN) 200 MG tablet Take 200 mg by mouth every 6 (six) hours as needed for moderate pain.    . metoprolol tartrate (LOPRESSOR) 25 MG tablet Take 25 mg by mouth daily.    . tamoxifen (NOLVADEX) 20 MG tablet Take 1 tablet (20 mg total) by mouth daily. 90 tablet PRN  . valsartan (DIOVAN) 40 MG tablet Take 40 mg by mouth daily as needed. If BP increases to Q000111Q (systolic) or [id number] diastolic       Allergies:   Patient has no known allergies.   Social History   Social History  . Marital status: Married    Spouse name: N/A  . Number of children: 3  . Years of education: N/A   Occupational History  . Retired Pharmacist, hospital Retired   Social History Main Topics  . Smoking status: Former Smoker    Quit  date: 08/06/1954  . Smokeless tobacco: Never Used  . Alcohol use Yes     Comment: Occasionally   . Drug use: No  . Sexual activity: No   Other Topics Concern  . Not on file   Social History Narrative   Lives at [place] since 05/1998   Widowed   Living will   Former smoker, stopped 1956   Alcohol  Rare   Exercise - walk one hour daily, 3 days of stretching and strengthen           Family History:  The patient's ***family history includes Alzheimer's disease in her sister; Cancer in her father; Stroke in her mother.   ROS:   Please see the history of present illness.    ROS All other systems reviewed and are negative.   PHYSICAL EXAM:   VS:  There were no vitals taken for this visit.   GEN: Well nourished, well developed, in no acute distress  HEENT: normal  Neck: no JVD, carotid bruits, or masses Cardiac: ***RRR; no murmurs, rubs, or gallops,no edema  Respiratory:  clear to auscultation bilaterally, normal work of breathing GI: soft, nontender, nondistended, + BS MS: no deformity or atrophy  Skin: warm and dry, no rash Neuro:  Alert and Oriented x 3, Strength and sensation are intact Psych: euthymic mood, full affect  Wt Readings from Last 3 Encounters:  01/23/16 100 lb (45.4 kg)  10/31/15 106 lb (48.1 kg)  10/01/15 105 lb (47.6 kg)      Studies/Labs Reviewed:   EKG:  EKG is*** ordered today.  The ekg ordered today demonstrates ***  Recent Labs: 01/23/2016: TSH 1.505 01/25/2016: Hemoglobin 14.7; Magnesium 2.4; Platelets 227 01/27/2016: ALT 22; BUN 23; Creatinine, Ser 0.91; Potassium 3.8; Sodium 125   Lipid Panel    Component Value Date/Time   CHOL 214 (H) 01/27/2016 0241   TRIG 96 01/27/2016 0241   HDL 62 01/27/2016 0241   CHOLHDL 3.5 01/27/2016 0241   VLDL 19 01/27/2016 0241   LDLCALC 133 (H) 01/27/2016 0241    Additional studies/ records that were reviewed today include:  ***    ASSESSMENT:    No diagnosis found.   PLAN:  In order of  problems listed above:  1. ***    Medication Adjustments/Labs and Tests Ordered: Current medicines are reviewed at length with the patient today.  Concerns regarding medicines are outlined above.  Medication changes, Labs and Tests ordered today are listed in the Patient Instructions below. There are no Patient Instructions on file for  this visit.   Hilbert Corrigan, [place]  01/28/2016 6:19 AM    [facility], [place], [place]  [address] Phone: [phone]; Fax: [phone]

## 2016-01-28 NOTE — Progress Notes (Signed)
Physical Therapy Treatment Patient Details Name: Cynthia Watkins MRN: [id number] DOB: 1924-07-08 Today's Date: 01/28/2016    History of Present Illness pt is an 83 y/o female Admitted on 1/17 with recent episodic gastroenteritis and was drinking fluids but not eating much. Her son advised her to drink more fluids and she was found to have metabolic encephalopathy secondary to profound hyponatremia    PT Comments    Pt performed increased gait and progressed to standing exercise at sink.  Pt reports fatigue but able to progress to gait without assistive device.  Pt also stood at sink to brush and floss teeth with no LOB or hand services.     Follow Up Recommendations  Home health PT     Equipment Recommendations  Rolling walker with 5" wheels    Recommendations for Other Services       Precautions / Restrictions Precautions Precautions: Fall Restrictions Weight Bearing Restrictions: No    Mobility  Bed Mobility               General bed mobility comments: pt in chair upon PT arrival  Transfers Overall transfer level: Needs assistance Equipment used: None Transfers: Sit to/from Stand Sit to Stand: Supervision         General transfer comment: Cues for hand placement to and from seated surface.    Ambulation/Gait Ambulation/Gait assistance: Supervision;Min guard Ambulation Distance (Feet): 200 Feet Assistive device: None Gait Pattern/deviations: Step-through pattern;Trunk flexed;Decreased stride length   Gait velocity interpretation: Below normal speed for age/gender General Gait Details: Cues for reciprocal armswing and forward gaze.     Stairs            Wheelchair Mobility    Modified Rankin (Stroke Patients Only)       Balance Overall balance assessment: Needs assistance   Sitting balance-Leahy Scale: Normal       Standing balance-Leahy Scale: Good Standing balance comment: gait without RW and intermittent use of counter during  standing exercise.                      Cognition Arousal/Alertness: Awake/alert Behavior During Therapy: WFL for tasks assessed/performed Overall Cognitive Status: Within Functional Limits for tasks assessed                      Exercises General Exercises - Lower Extremity Quad Sets: AROM;Both;10 reps;Supine Long Arc Quad: AROM;Both;10 reps;Seated Straight Leg Raises: AROM;Both;10 reps;Supine Hip Flexion/Marching: AROM;Both;10 reps;Standing Heel Raises: AROM;Both;10 reps;Standing Mini-Sqauts: AROM;Both;10 reps;Standing    General Comments        Pertinent Vitals/Pain Pain Assessment: No/denies pain    Home Living Family/patient expects to be discharged to:: Other (Comment) (Rehab ) Living Arrangements: Alone                  Prior Function            PT Goals (current goals can now be found in the care plan section) Acute Rehab PT Goals Patient Stated Goal: go home and get stronger Potential to Achieve Goals: Good Progress towards PT goals: Progressing toward goals    Frequency    Min 3X/week      PT Plan Current plan remains appropriate    Co-evaluation             End of Session Equipment Utilized During Treatment: Gait belt Activity Tolerance: Patient tolerated treatment well Patient left: in chair;with call bell/phone within reach;with family/visitor present  Time: [id number] PT Time Calculation (min) (ACUTE ONLY): 23 min  Charges:  $Gait Training: 8-22 mins $Therapeutic Exercise: 8-22 mins                    G Codes:      Cristela Blue 14-Feb-2016, 12:00 PM Governor Rooks, PTA pager [phone]

## 2016-01-28 NOTE — Care Management Important Message (Signed)
Important Message  Patient Details  Name: Cynthia Watkins MRN: [id number] Date of Birth: 03-23-1924   Medicare Important Message Given:  Yes    [name] 01/28/2016, 11:07 AM

## 2016-01-28 NOTE — Progress Notes (Signed)
[facility] Progress Note    Assessment/ Plan:   1.  Hyponatremia: pt presenting with severe hyponatremia (nadir 106) and associated neurologic deficits.  Rec'd 3% NaCl on 1/17 (106->112), dose of Samsca on 1/18, unk amt NS. Stroke workup negative. Last known serum sodium  PTA 130 in 09/24/2015.   Initial serum osms 227, urine osms 254, and urine sodium 72.  Maximally dilute urine is 50-100 mmol/L; however given pt's advanced age would expect maximally dilute urine to be around 150 mmol/ L or so.  Urine osms of 254 indicates inappropriate presence of ADH.  Given the clinical picture, was felt that fluid intake without concomitant adequate solute intake was also playing a role.  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    - continue 1L fluid restriction - would not restrict sodium in diet - continue sodium determinations BID - would not administer normal saline as this will not correct serum sodium. - continue Lasix Po, increased to 20 BID on 1/21 - TSH normal - AM cortisol normal - CT chest without malignancy - FOBT - can d/c once Na above 130  Subjective:    Sodium continues to slowly improve, up to 127 this AM Lasix increased yesterday to 20 mg BID    Objective:   BP 119/67 (BP Location: Left Arm)   Pulse 66   Temp 98.1 F (36.7 C) (Oral)   Resp 20   Ht 5\' 2"  (1.575 m)   Wt 45.4 kg (100 lb)   SpO2 99%   BMI 18.29 kg/m   Intake/Output Summary (Last 24 hours) at 01/28/16 0705 Last data filed at 01/27/16 2000  Gross per 24 hour  Intake              615 ml  Output                0 ml  Net              615 ml   Weight change:   Physical Exam: Gen: NAD HEENT: MMM CVS: RRR no mr/g Resp: CTAB no c/w/r Abd: soft, nontender nondistended, NABS No LE edema  Imaging: No results found.  Labs: BMET  Recent Labs Lab 01/23/16 1025 01/23/16 1638  01/23/16 1917  01/24/16 0301 01/24/16 0541   01/24/16 1535  01/25/16 0131 01/25/16 0543  01/26/16 0705 01/26/16 1036 01/26/16 1542 01/26/16 1902 01/27/16 0241 01/27/16 1814 01/28/16 0530  NA 107* 112*  < > 112*  < > 112* 112*  112*  < > 110*  110*  < > 117* 122*  < > 123* 121* 124* 119* 126* 125* 127*  K 3.6 3.4*  --  3.6  --   --  3.1*  --  3.5  --  3.5  --   --   --  3.6  --   --  3.8  --   --   CL 75* 80*  --  77*  --   --  80*  --  80*  --  84*  --   --   --  89*  --   --  91*  --   --   CO2 20* 18*  --  23  --   --  23  --  20*  --  24  --   --   --  20*  --   --  27  --   --   GLUCOSE 149*  120*  --  163*  --   --  103*  --  138*  --  112*  --   --   --  122*  --   --  90  --   --   BUN 6 6  --  6  --   --  6  --  11  --  8  --   --   --  21*  --   --  23*  --   --   CREATININE 0.60 0.42*  --  0.51  --   --  0.54  --  0.59  --  0.58  --   --   --  0.95  --   --  0.91  --   --   CALCIUM 7.5* 7.4*  --  7.7*  --   --  7.6*  --  7.9*  --  7.9*  --   --   --  8.9  --   --  8.7*  --   --   PHOS 2.2*  --   --   --   --  2.0*  --   --   --   --   --  2.1*  --   --   --   --   --   --   --   --   < > = values in this interval not displayed.   Recent Labs Lab 01/23/16 0558 01/23/16 0625 01/23/16 0815 01/24/16 0301 01/25/16 0543  WBC 3.4*  --  4.1 8.2 5.9  NEUTROABS 2.6  --   --   --   --   HGB 13.2 14.3 13.1 13.8 14.7  HCT 35.2* 42.0 34.2* 36.4 38.9  MCV 88.4  --  87.0 88.6 88.6  PLT 149*  --  155 219 227    Medications:    . furosemide  20 mg Oral BID  . heparin  5,000 Units Subcutaneous Q8H  . tamoxifen  20 mg Oral Daily   Jamal Maes, MD [facility] [phone] Pager 01/28/2016, 7:20 AM

## 2016-01-29 LAB — BASIC METABOLIC PANEL
ANION GAP: 9 (ref 5–15)
BUN: 25 mg/dL — AB (ref 6–20)
CALCIUM: 8.7 mg/dL — AB (ref 8.9–10.3)
CO2: 29 mmol/L (ref 22–32)
CREATININE: 0.88 mg/dL (ref 0.44–1.00)
Chloride: 91 mmol/L — ABNORMAL LOW (ref 101–111)
GFR calc Af Amer: >60 mL/min (ref >60)
GFR calc non Af Amer: 57 mL/min — ABNORMAL LOW (ref >60)
GLUCOSE: 93 mg/dL (ref 65–99)
Potassium: 3.4 mmol/L — ABNORMAL LOW (ref 3.5–5.1)
Sodium: 129 mmol/L — ABNORMAL LOW (ref 135–145)

## 2016-01-29 MED ORDER — POTASSIUM CHLORIDE CRYS ER 20 MEQ PO TBCR: 20.0000 meq | EXTENDED_RELEASE_TABLET | Freq: Every day | ORAL | 0 refills | Status: DC

## 2016-01-29 MED ORDER — ATENOLOL 25 MG PO TABS: 12.5000 mg | ORAL_TABLET | Freq: Every day | ORAL | 0 refills | Status: DC

## 2016-01-29 MED ORDER — FUROSEMIDE 20 MG PO TABS: 20.0000 mg | ORAL_TABLET | Freq: Every day | ORAL | 0 refills | Status: DC

## 2016-01-29 MED ORDER — FUROSEMIDE 20 MG PO TABS
20.0000 mg | ORAL_TABLET | Freq: Every day | ORAL | Status: DC
  Administered 2016-01-29: 20 mg via ORAL
  Filled 2016-01-29: qty 1

## 2016-01-29 MED ORDER — POLYETHYLENE GLYCOL 3350 17 G PO PACK: 17.0000 g | PACK | Freq: Every day | ORAL | 0 refills | Status: DC | PRN

## 2016-01-29 NOTE — Discharge Summary (Signed)
Physician Discharge Summary  Cynthia Watkins [id number] DOB: [birth year] DOA: 01/23/2016  PCP: [name], DO  Admit date: 01/23/2016 Discharge date: 01/29/2016  Time spent: 35 minutes  Recommendations for Outpatient Follow-up:  1. Please monitor orthostatics carefully and patient will need education regarding slow rising and positional changes as she does need the atenolol heart rate control 2. Recommend Chem-7 in about 3 days and then discontinuation of Lasix-limited amount prescribed on discharge 3. Recommend liberalization of diet to regular with regular amount of salt 4. Suggest discussion about blood sugars as an outpatient 5. Hold off of ARB for now  Discharge Diagnoses:  Principal Problem:   Acute metabolic encephalopathy Active Problems:   Hypertension   Hypercholesterolemia   Breast cancer of lower-outer quadrant of right female breast ([place])   Hyponatremia   Discharge Condition: improved  Diet recommendation: reg  Filed Weights   01/23/16 0607  Weight: 45.4 kg (100 lb)    History of present illness:  83 year old female Prior history HTN Breast cancer-intermed grade R Ductal CA s/p lumpectomy 12/2007 + XRT  Osteoporosis Hyperglycemia Prior SBO 2/2 adhesions  htn hld  Admitted on 1/17 with recent episodic gastroenteritis and was drinking fluids but not eating much. Her son advised her to drink more fluids and she was found to have metabolic encephalopathy secondary to profound hyponatremia sodium initially 107-her baseline sodium 09/2015 was 130.  Initially admitted to the ICU requiring 3% saline, seen by nephrology Scans of the head including CT  negative for other organic pathology  Hospital Course:   Acute metabolic encephalopathy-neurologist s/o as felt metabolic-? 2/2 reset osmostat 2/2 to excess free water load+/- SIADH per nephrologist-appreciate input.  Profound hyponatremia-initally on CCM service and place on 3% hypertonic saline.  regular diet  with adequate salt-q 8 sodium checks-aquarese as per nephrology with po lasix changed on discharge to once daily 20 mg 7 tablets given on-watch creat-cortisol/tsh/chest ct neg for other sources  Given fluctuating levels of sodium  parathyroid level done was normal, lipid panel ordered 1/21, ~ first day of hospital stay with interval slow resolution of sodium level-is now 122-->127 -->129  Hypertension-patient is on metoprolol 25 daily which has been held. We will also hold the valsartan as blood pressures are low 1 teens--she has been placed on atenolol 12.5 daily for episodic sinus tach and we are reimplemented to this that she also has had some orthostasis and will need to be monitored weekly at facility  Breast cancer-intermediate right.no CVA status post lumpectomy XRT-currently stable. Continue tamoxifen 20 daily-ct chest c/out contrast 1/19 was neg  Hyperglycemia-diet controlled-ranges during hospital stay have been 112 to 130  Hypokalemia 3.1 -potassium 3.8 now -will need further replacement    osteoporosis- resume vitamin D other meds as an outpatient   Consultations:  Nephrology  Discharge Exam: Vitals:   01/29/16 0618 01/29/16 0900  BP: 137/67   Pulse: 75 94  Resp: 16   Temp: 97.8 F (36.6 C)     General: Pleasant oriented no apparent distress Cardiovascular: S1-S2 no murmur rub or gallop Respiratory: Clinically clear no added sound  Discharge Instructions   Discharge Instructions    Diet - low sodium heart healthy    Complete by:  As directed    Discharge instructions    Complete by:  As directed    Please have blood pressure checked every couple of days and see what the trends are.   I will be prescribing lasix 20 mg for a short period of  time-this can probably be discontinued in about 4-5 days or when the sodium is about 132--I would defer to your regular Md regarding this Would advise getting your sodium and kidney function checked in about 3 days Please  adhere to a 1500 cc limited fluid intake for the time being Eat a regular diet-do not limit salt in the diet. I do not think you will need anything more than therapy seeing you at home as well as a rolling walker and we will get these ordered for you   Increase activity slowly    Complete by:  As directed    Increase activity slowly    Complete by:  As directed      Current Discharge Medication List    START taking these medications   Details  atenolol (TENORMIN) 25 MG tablet Take 0.5 tablets (12.5 mg total) by mouth daily. Qty: 30 tablet, Refills: 0    furosemide (LASIX) 20 MG tablet Take 1 tablet (20 mg total) by mouth daily. Qty: 7 tablet, Refills: 0    polyethylene glycol (MIRALAX / GLYCOLAX) packet Take 17 g by mouth daily as needed for mild constipation. Qty: 14 each, Refills: 0    potassium chloride SA (K-DUR,KLOR-CON) 20 MEQ tablet Take 1 tablet (20 mEq total) by mouth daily. Qty: 7 tablet, Refills: 0      CONTINUE these medications which have NOT CHANGED   Details  Biotin 5000 MCG CAPS Take 1 capsule by mouth daily.     calcium carbonate (CALCIUM 600) 600 MG TABS tablet Take 600 mg by mouth daily with breakfast.    Cholecalciferol (VITAMIN D3) 1000 units CAPS Take 1,000 Units by mouth daily.    tamoxifen (NOLVADEX) 20 MG tablet Take 1 tablet (20 mg total) by mouth daily. Qty: 90 tablet, Refills: PRN   Associated Diagnoses: Malignant neoplasm of lower-outer quadrant of female breast, right      STOP taking these medications     ibuprofen (ADVIL,MOTRIN) 200 MG tablet      metoprolol tartrate (LOPRESSOR) 25 MG tablet      valsartan (DIOVAN) 40 MG tablet        No Known Allergies    The results of significant diagnostics from this hospitalization (including imaging, microbiology, ancillary and laboratory) are listed below for reference.    Significant Diagnostic Studies: Ct Angio Head W Or Wo Contrast  Result Date: 01/23/2016 CLINICAL DATA:  83 year old  female with global aphasia. Initial encounter. EXAM: CT ANGIOGRAPHY HEAD AND NECK CT HEAD PERFUSION TECHNIQUE: Multiphase CT imaging of the brain was performed following IV bolus contrast injection. Subsequent parametric perfusion maps were calculated using RAPID software. Multidetector CT imaging of the head and neck was performed using the standard protocol during bolus administration of intravenous contrast. Multiplanar CT image reconstructions and MIPs were obtained to evaluate the vascular anatomy. Carotid stenosis measurements (when applicable) are obtained utilizing NASCET criteria, using the distal internal carotid diameter as the denominator. CONTRAST:  100 mL Isovue 370 COMPARISON:  None. FINDINGS: CT HEAD FINDINGS Brain: No midline shift, ventriculomegaly, mass effect, evidence of mass lesion, intracranial hemorrhage or evidence of cortically based acute infarction. Gray-white matter differentiation is within normal limits throughout the brain. No cortical encephalomalacia identified. ASPECTS 10. Vascular: See below. Skull: No acute osseous abnormality identified. Sinuses: Clear. Orbits: Postoperative changes to both globes. No acute orbit or scalp soft tissue finding. CT BRAIN PERFUSION FINDINGS: CBF (<30%) Volume:  0 mL Perfusion (Tmax>6.0s) volume: 8 mL (in the left cerebellar hemisphere,  likely artifact). 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 No acute osseous abnormality identified. Upper chest: Negative lung apices ; mild apical scarring and dependent atelectasis. Other neck: Negative thyroid. Larynx, pharynx, parapharyngeal spaces, retropharyngeal space, sublingual space, submandibular glands and parotid glands are within normal limits. No lymphadenopathy. Aortic arch: 3 vessel arch configuration. Minimal arch atherosclerosis. No great vessel origin stenosis. Right carotid system: Negative except for  tortuosity and minimal right carotid bifurcation atherosclerosis. Left carotid system: Negative except for tortuosity in minimal left carotid bifurcation atherosclerosis. Vertebral arteries:No proximal subclavian artery stenosis. Normal right vertebral artery origin. Tortuous right V1 segment which is in proximity to thyrocervical branches but as expected does not appear to give off any branches. Tortuous right V2 segment. Tortuous right V3 segment. No vertebral artery stenosis to the skullbase. Normal left vertebral artery origin. Tortuous left V1 segment. Tortuous left V2 and V3 segments. No left vertebral artery stenosis to the skullbase. CTA HEAD Posterior circulation: Both vertebral artery V4 segments and the basilar are diminutive. Both PICA origins appear patent. Mild fenestration of the vertebrobasilar junction. Diminutive basilar artery without stenosis. Fetal type bilateral PCA origins. SCA origins are patent. The right PCA branches are normal. There is mild irregularity of the left PCA P2 and P3 segments with preserved distal enhancement. Anterior circulation: Both ICA siphons are patent. Both cavernous segments are tortuous. Mild bilateral siphon calcified plaque without stenosis. Ophthalmic and posterior communicating artery origins are normal. Normal carotid termini, MCA and ACA origin. Anterior communicating artery and bilateral ACA branches are within normal limits. There is a small median artery of the corpus callosum. Right MCA M1 segment, trifurcation, and right MCA branches are within normal limits. Left MCA M1 segment is normal except for an infundibulum of the lenticulostriate origin (series 605, image 21). The left MCA bifurcation is patent. Left MCA branches appear within normal limits. No left MCA branch occlusion is identified. Venous sinuses: Insufficient venous contrast on these images. Anatomic variants: Fetal type bilateral PCA origins with diminutive distal vertebral arteries and  basilar. Review of the MIP images confirms the above findings IMPRESSION: 1.  Normal noncontrast CT appearance of the brain.  ASPECTS 10. 2. Negative for emergent large vessel occlusion. CT Perfusion data analysis is negative for infarct core or convincing penumbra. 3. The above was discussed by telephone with Dr. Roland Rack on 01/23/2016 at 0648 hours. 4. Tortuous cervical carotid and vertebral arteries with minimal atherosclerosis and no stenosis. 5. Tortuous ICA siphons with minimal atherosclerosis and no stenosis. Otherwise negative anterior circulation; small left MCA M1 infundibulum of the left lenticulostriate artery origin (normal variant). 6. Diminutive vertebrobasilar system on the basis of fetal type PCA origins. Mild atherosclerosis and stenosis of the left PCA (P2 and P3 segments). Otherwise negative posterior circulation. Electronically Signed   By: Genevie Ann M.D.   On: 01/23/2016 07:14   Ct Head Wo Contrast  Result Date: 01/23/2016 CLINICAL DATA:  83 year old female with global aphasia. Initial encounter. EXAM: CT ANGIOGRAPHY HEAD AND NECK CT HEAD PERFUSION TECHNIQUE: Multiphase CT imaging of the brain was performed following IV bolus contrast injection. Subsequent parametric perfusion maps were calculated using RAPID software. Multidetector CT imaging of the head and neck was performed using the standard protocol during bolus administration of intravenous contrast. Multiplanar CT image reconstructions and MIPs were obtained to evaluate the vascular anatomy. Carotid stenosis measurements (when applicable) are obtained utilizing NASCET criteria, using the distal  internal carotid diameter as the denominator. CONTRAST:  100 mL Isovue 370 COMPARISON:  None. FINDINGS: CT HEAD FINDINGS Brain: No midline shift, ventriculomegaly, mass effect, evidence of mass lesion, intracranial hemorrhage or evidence of cortically based acute infarction. Gray-white matter differentiation is within normal limits  throughout the brain. No cortical encephalomalacia identified. ASPECTS 10. Vascular: See below. Skull: No acute osseous abnormality identified. Sinuses: Clear. Orbits: Postoperative changes to both globes. No acute orbit or scalp soft tissue finding. CT BRAIN PERFUSION FINDINGS: CBF (<30%) Volume:  0 mL Perfusion (Tmax>6.0s) volume: 8 mL (in the left cerebellar hemisphere, likely artifact). 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 No acute osseous abnormality identified. Upper chest: Negative lung apices ; mild apical scarring and dependent atelectasis. Other neck: Negative thyroid. Larynx, pharynx, parapharyngeal spaces, retropharyngeal space, sublingual space, submandibular glands and parotid glands are within normal limits. No lymphadenopathy. Aortic arch: 3 vessel arch configuration. Minimal arch atherosclerosis. No great vessel origin stenosis. Right carotid system: Negative except for tortuosity and minimal right carotid bifurcation atherosclerosis. Left carotid system: Negative except for tortuosity in minimal left carotid bifurcation atherosclerosis. Vertebral arteries:No proximal subclavian artery stenosis. Normal right vertebral artery origin. Tortuous right V1 segment which is in proximity to thyrocervical branches but as expected does not appear to give off any branches. Tortuous right V2 segment. Tortuous right V3 segment. No vertebral artery stenosis to the skullbase. Normal left vertebral artery origin. Tortuous left V1 segment. Tortuous left V2 and V3 segments. No left vertebral artery stenosis to the skullbase. CTA HEAD Posterior circulation: Both vertebral artery V4 segments and the basilar are diminutive. Both PICA origins appear patent. Mild fenestration of the vertebrobasilar junction. Diminutive basilar artery without stenosis. Fetal type bilateral PCA origins. SCA origins are patent. The right PCA  branches are normal. There is mild irregularity of the left PCA P2 and P3 segments with preserved distal enhancement. Anterior circulation: Both ICA siphons are patent. Both cavernous segments are tortuous. Mild bilateral siphon calcified plaque without stenosis. Ophthalmic and posterior communicating artery origins are normal. Normal carotid termini, MCA and ACA origin. Anterior communicating artery and bilateral ACA branches are within normal limits. There is a small median artery of the corpus callosum. Right MCA M1 segment, trifurcation, and right MCA branches are within normal limits. Left MCA M1 segment is normal except for an infundibulum of the lenticulostriate origin (series 605, image 21). The left MCA bifurcation is patent. Left MCA branches appear within normal limits. No left MCA branch occlusion is identified. Venous sinuses: Insufficient venous contrast on these images. Anatomic variants: Fetal type bilateral PCA origins with diminutive distal vertebral arteries and basilar. Review of the MIP images confirms the above findings IMPRESSION: 1.  Normal noncontrast CT appearance of the brain.  ASPECTS 10. 2. Negative for emergent large vessel occlusion. CT Perfusion data analysis is negative for infarct core or convincing penumbra. 3. The above was discussed by telephone with Dr. Roland Rack on 01/23/2016 at 0648 hours. 4. Tortuous cervical carotid and vertebral arteries with minimal atherosclerosis and no stenosis. 5. Tortuous ICA siphons with minimal atherosclerosis and no stenosis. Otherwise negative anterior circulation; small left MCA M1 infundibulum of the left lenticulostriate artery origin (normal variant). 6. Diminutive vertebrobasilar system on the basis of fetal type PCA origins. Mild atherosclerosis and stenosis of the left PCA (P2 and P3 segments). Otherwise negative posterior circulation. Electronically Signed   By: Genevie Ann M.D.   On:  01/23/2016 07:14   Ct Angio Neck W Or Wo  Contrast  Result Date: 01/23/2016 CLINICAL DATA:  83 year old female with global aphasia. Initial encounter. EXAM: CT ANGIOGRAPHY HEAD AND NECK CT HEAD PERFUSION TECHNIQUE: Multiphase CT imaging of the brain was performed following IV bolus contrast injection. Subsequent parametric perfusion maps were calculated using RAPID software. Multidetector CT imaging of the head and neck was performed using the standard protocol during bolus administration of intravenous contrast. Multiplanar CT image reconstructions and MIPs were obtained to evaluate the vascular anatomy. Carotid stenosis measurements (when applicable) are obtained utilizing NASCET criteria, using the distal internal carotid diameter as the denominator. CONTRAST:  100 mL Isovue 370 COMPARISON:  None. FINDINGS: CT HEAD FINDINGS Brain: No midline shift, ventriculomegaly, mass effect, evidence of mass lesion, intracranial hemorrhage or evidence of cortically based acute infarction. Gray-white matter differentiation is within normal limits throughout the brain. No cortical encephalomalacia identified. ASPECTS 10. Vascular: See below. Skull: No acute osseous abnormality identified. Sinuses: Clear. Orbits: Postoperative changes to both globes. No acute orbit or scalp soft tissue finding. CT BRAIN PERFUSION FINDINGS: CBF (<30%) Volume:  0 mL Perfusion (Tmax>6.0s) volume: 8 mL (in the left cerebellar hemisphere, likely artifact). 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 No acute osseous abnormality identified. Upper chest: Negative lung apices ; mild apical scarring and dependent atelectasis. Other neck: Negative thyroid. Larynx, pharynx, parapharyngeal spaces, retropharyngeal space, sublingual space, submandibular glands and parotid glands are within normal limits. No lymphadenopathy. Aortic arch: 3 vessel arch configuration. Minimal arch atherosclerosis. No great  vessel origin stenosis. Right carotid system: Negative except for tortuosity and minimal right carotid bifurcation atherosclerosis. Left carotid system: Negative except for tortuosity in minimal left carotid bifurcation atherosclerosis. Vertebral arteries:No proximal subclavian artery stenosis. Normal right vertebral artery origin. Tortuous right V1 segment which is in proximity to thyrocervical branches but as expected does not appear to give off any branches. Tortuous right V2 segment. Tortuous right V3 segment. No vertebral artery stenosis to the skullbase. Normal left vertebral artery origin. Tortuous left V1 segment. Tortuous left V2 and V3 segments. No left vertebral artery stenosis to the skullbase. CTA HEAD Posterior circulation: Both vertebral artery V4 segments and the basilar are diminutive. Both PICA origins appear patent. Mild fenestration of the vertebrobasilar junction. Diminutive basilar artery without stenosis. Fetal type bilateral PCA origins. SCA origins are patent. The right PCA branches are normal. There is mild irregularity of the left PCA P2 and P3 segments with preserved distal enhancement. Anterior circulation: Both ICA siphons are patent. Both cavernous segments are tortuous. Mild bilateral siphon calcified plaque without stenosis. Ophthalmic and posterior communicating artery origins are normal. Normal carotid termini, MCA and ACA origin. Anterior communicating artery and bilateral ACA branches are within normal limits. There is a small median artery of the corpus callosum. Right MCA M1 segment, trifurcation, and right MCA branches are within normal limits. Left MCA M1 segment is normal except for an infundibulum of the lenticulostriate origin (series 605, image 21). The left MCA bifurcation is patent. Left MCA branches appear within normal limits. No left MCA branch occlusion is identified. Venous sinuses: Insufficient venous contrast on these images. Anatomic variants: Fetal type  bilateral PCA origins with diminutive distal vertebral arteries and basilar. Review of the MIP images confirms the above findings IMPRESSION: 1.  Normal noncontrast CT appearance of the brain.  ASPECTS 10. 2. Negative for emergent large vessel occlusion. CT Perfusion data analysis  is negative for infarct core or convincing penumbra. 3. The above was discussed by telephone with Dr. Roland Rack on 01/23/2016 at 0648 hours. 4. Tortuous cervical carotid and vertebral arteries with minimal atherosclerosis and no stenosis. 5. Tortuous ICA siphons with minimal atherosclerosis and no stenosis. Otherwise negative anterior circulation; small left MCA M1 infundibulum of the left lenticulostriate artery origin (normal variant). 6. Diminutive vertebrobasilar system on the basis of fetal type PCA origins. Mild atherosclerosis and stenosis of the left PCA (P2 and P3 segments). Otherwise negative posterior circulation. Electronically Signed   By: Genevie Ann M.D.   On: 01/23/2016 07:14   Ct Chest Wo Contrast  Result Date: 01/25/2016 CLINICAL DATA:  83 year old female with history of hepatic encephalopathy and hyponatremia. History of breast cancer status post radiation therapy and chemotherapy. EXAM: CT CHEST WITHOUT CONTRAST TECHNIQUE: Multidetector CT imaging of the chest was performed following the standard protocol without IV contrast. COMPARISON:  No priors. FINDINGS: Cardiovascular: Heart size is normal. There is no significant pericardial fluid, thickening or pericardial calcification. There is aortic atherosclerosis, as well as atherosclerosis of the great vessels of the mediastinum and the coronary arteries, including calcified atherosclerotic plaque in the left main, left anterior descending, left circumflex and right coronary arteries. Calcifications of the aortic valve. Mediastinum/Nodes: No pathologically enlarged mediastinal or hilar lymph nodes. Please note that accurate exclusion of hilar adenopathy is  limited on noncontrast CT scans. Densely calcified right hilar lymph node incidentally noted. Esophagus is unremarkable in appearance. No axillary lymphadenopathy. Lungs/Pleura: A few scattered tiny calcified granulomas are noted. Several other 2-3 mm pulmonary nodules are seen scattered throughout the lungs bilaterally, highly nonspecific, but statistically likely to represent benign areas of mucoid impaction within terminal bronchioles. 5 mm subpleural nodule in the medial aspect of the superior segment of the left lower lobe (image 67 of series 7). No other larger more suspicious appearing pulmonary nodules or masses are noted. Mild scarring in the dependent portions of the lower lobes of the lungs bilaterally. No acute consolidative airspace disease. No pleural effusions. Upper Abdomen: Aortic atherosclerosis. Numerous calcified granulomas throughout the spleen. Musculoskeletal: There are no aggressive appearing lytic or blastic lesions noted in the visualized portions of the skeleton. IMPRESSION: 1. No acute findings in the thorax. 2. Multiple tiny pulmonary nodules measuring 5 mm or less in size, favored to be benign. However, given the patient's history breast cancer, noncontrast chest CT is recommended in 1 year to ensure the stability of these findings. This recommendation follows the consensus statement: Guidelines for Management of Incidental Pulmonary Nodules Detected on CT Images: From the Fleischner Society 2017; Radiology 2017; 284:228-243. 3. Aortic atherosclerosis, in addition to left main and 3 vessel coronary artery disease. Please note that although the presence of coronary artery calcium documents the presence of coronary artery disease, the severity of this disease and any potential stenosis cannot be assessed on this non-gated CT examination. Assessment for potential risk factor modification, dietary therapy or pharmacologic therapy may be warranted, if clinically indicated. 4. There are  calcifications of the aortic valve. Echocardiographic correlation for evaluation of potential valvular dysfunction may be warranted if clinically indicated. 5. Old granulomatous disease, as above. Electronically Signed   By: Vinnie Langton M.D.   On: 01/25/2016 13:21   Ct Cerebral Perfusion W Contrast  Result Date: 01/23/2016 CLINICAL DATA:  83 year old female with global aphasia. Initial encounter. EXAM: CT ANGIOGRAPHY HEAD AND NECK CT HEAD PERFUSION TECHNIQUE: Multiphase CT imaging of the brain was performed following  IV bolus contrast injection. Subsequent parametric perfusion maps were calculated using RAPID software. Multidetector CT imaging of the head and neck was performed using the standard protocol during bolus administration of intravenous contrast. Multiplanar CT image reconstructions and MIPs were obtained to evaluate the vascular anatomy. Carotid stenosis measurements (when applicable) are obtained utilizing NASCET criteria, using the distal internal carotid diameter as the denominator. CONTRAST:  100 mL Isovue 370 COMPARISON:  None. FINDINGS: CT HEAD FINDINGS Brain: No midline shift, ventriculomegaly, mass effect, evidence of mass lesion, intracranial hemorrhage or evidence of cortically based acute infarction. Gray-white matter differentiation is within normal limits throughout the brain. No cortical encephalomalacia identified. ASPECTS 10. Vascular: See below. Skull: No acute osseous abnormality identified. Sinuses: Clear. Orbits: Postoperative changes to both globes. No acute orbit or scalp soft tissue finding. CT BRAIN PERFUSION FINDINGS: CBF (<30%) Volume:  0 mL Perfusion (Tmax>6.0s) volume: 8 mL (in the left cerebellar hemisphere, likely artifact). 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 No acute osseous abnormality identified. Upper chest: Negative lung apices ; mild apical scarring and  dependent atelectasis. Other neck: Negative thyroid. Larynx, pharynx, parapharyngeal spaces, retropharyngeal space, sublingual space, submandibular glands and parotid glands are within normal limits. No lymphadenopathy. Aortic arch: 3 vessel arch configuration. Minimal arch atherosclerosis. No great vessel origin stenosis. Right carotid system: Negative except for tortuosity and minimal right carotid bifurcation atherosclerosis. Left carotid system: Negative except for tortuosity in minimal left carotid bifurcation atherosclerosis. Vertebral arteries:No proximal subclavian artery stenosis. Normal right vertebral artery origin. Tortuous right V1 segment which is in proximity to thyrocervical branches but as expected does not appear to give off any branches. Tortuous right V2 segment. Tortuous right V3 segment. No vertebral artery stenosis to the skullbase. Normal left vertebral artery origin. Tortuous left V1 segment. Tortuous left V2 and V3 segments. No left vertebral artery stenosis to the skullbase. CTA HEAD Posterior circulation: Both vertebral artery V4 segments and the basilar are diminutive. Both PICA origins appear patent. Mild fenestration of the vertebrobasilar junction. Diminutive basilar artery without stenosis. Fetal type bilateral PCA origins. SCA origins are patent. The right PCA branches are normal. There is mild irregularity of the left PCA P2 and P3 segments with preserved distal enhancement. Anterior circulation: Both ICA siphons are patent. Both cavernous segments are tortuous. Mild bilateral siphon calcified plaque without stenosis. Ophthalmic and posterior communicating artery origins are normal. Normal carotid termini, MCA and ACA origin. Anterior communicating artery and bilateral ACA branches are within normal limits. There is a small median artery of the corpus callosum. Right MCA M1 segment, trifurcation, and right MCA branches are within normal limits. Left MCA M1 segment is normal except  for an infundibulum of the lenticulostriate origin (series 605, image 21). The left MCA bifurcation is patent. Left MCA branches appear within normal limits. No left MCA branch occlusion is identified. Venous sinuses: Insufficient venous contrast on these images. Anatomic variants: Fetal type bilateral PCA origins with diminutive distal vertebral arteries and basilar. Review of the MIP images confirms the above findings IMPRESSION: 1.  Normal noncontrast CT appearance of the brain.  ASPECTS 10. 2. Negative for emergent large vessel occlusion. CT Perfusion data analysis is negative for infarct core or convincing penumbra. 3. The above was discussed by telephone with Dr. Roland Rack on 01/23/2016 at 0648 hours. 4. Tortuous cervical carotid and vertebral arteries with minimal atherosclerosis and no stenosis. 5. Tortuous ICA siphons with minimal atherosclerosis and  no stenosis. Otherwise negative anterior circulation; small left MCA M1 infundibulum of the left lenticulostriate artery origin (normal variant). 6. Diminutive vertebrobasilar system on the basis of fetal type PCA origins. Mild atherosclerosis and stenosis of the left PCA (P2 and P3 segments). Otherwise negative posterior circulation. Electronically Signed   By: Genevie Ann M.D.   On: 01/23/2016 07:14    Microbiology: Recent Results (from the past 240 hour(s))  MRSA PCR Screening     Status: None   Collection Time: 01/23/16  3:20 PM  Result Value Ref Range Status   MRSA by PCR NEGATIVE NEGATIVE Final    Comment:        The GeneXpert MRSA Assay (FDA approved for NASAL specimens only), is one component of a comprehensive MRSA colonization surveillance program. It is not intended to diagnose MRSA infection nor to guide or monitor treatment for MRSA infections.      Labs: Basic Metabolic Panel:  Recent Labs Lab 01/23/16 0815 01/23/16 1025  01/24/16 0301  01/24/16 1535  01/25/16 0131 01/25/16 0543  01/26/16 1036  01/27/16 0241  01/27/16 1814 01/28/16 0530 01/28/16 1806 01/29/16 0605  NA 106* 107*  < > 112*  < > 110*  110*  < > 117* 122*  < > 121*  < > 126* 125* 127* 129* 129*  K 3.5 3.6  < >  --   < > 3.5  --  3.5  --   --  3.6  --  3.8  --   --   --  3.4*  CL 73* 75*  < >  --   < > 80*  --  84*  --   --  89*  --  91*  --   --   --  91*  CO2 20* 20*  < >  --   < > 20*  --  24  --   --  20*  --  27  --   --   --  29  GLUCOSE 153* 149*  < >  --   < > 138*  --  112*  --   --  122*  --  90  --   --   --  93  BUN 7 6  < >  --   < > 11  --  8  --   --  21*  --  23*  --   --   --  25*  CREATININE 0.60 0.60  < >  --   < > 0.59  --  0.58  --   --  0.95  --  0.91  --   --   --  0.88  CALCIUM 7.6* 7.5*  < >  --   < > 7.9*  --  7.9*  --   --  8.9  --  8.7*  --   --   --  8.7*  MG 1.4*  --   --  2.1  --   --   --   --  2.4  --   --   --   --   --   --   --   --   PHOS  --  2.2*  --  2.0*  --   --   --   --  2.1*  --   --   --   --   --   --   --   --   < > = values in this  interval not displayed. Liver Function Tests:  Recent Labs Lab 01/23/16 0558 01/27/16 0241  AST 31 27  ALT 18 22  ALKPHOS 39 43  BILITOT 1.2 0.7  PROT 6.0* 5.5*  ALBUMIN 3.7 3.2*    Recent Labs Lab 01/23/16 0558  LIPASE 21   No results for input(s): AMMONIA in the last 168 hours. CBC:  Recent Labs Lab 01/23/16 0558 01/23/16 0625 01/23/16 0815 01/24/16 0301 01/25/16 0543  WBC 3.4*  --  4.1 8.2 5.9  NEUTROABS 2.6  --   --   --   --   HGB 13.2 14.3 13.1 13.8 14.7  HCT 35.2* 42.0 34.2* 36.4 38.9  MCV 88.4  --  87.0 88.6 88.6  PLT 149*  --  155 219 227   Cardiac Enzymes: No results for input(s): CKTOTAL, CKMB, CKMBINDEX, TROPONINI in the last 168 hours. BNP: BNP (last 3 results)  Recent Labs  01/28/16 0530  BNP 36.2    ProBNP (last 3 results) No results for input(s): PROBNP in the last 8760 hours.  CBG: No results for input(s): GLUCAP in the last 168 hours.     SignedNita Sells MD   [facility] 01/29/2016, 9:26 AM

## 2016-01-29 NOTE — Care Management Note (Signed)
Case Management Note  Patient Details  Name: KENADEE GATES MRN: [id number] Date of Birth: Dec 29, 1924  Subjective/Objective:                    Action/Plan: CM met with Ms Husband to arrange [facility] services at [facility]. Pt states she has arranged with [facility] to go to the rehab department at discharge. CM informed CSW who will f/u about admitting to rehab at  [facility]. Pt with orders for rolling walker. Pt has refused the walker at this time.   Expected Discharge Date:  01/29/16               Expected Discharge Plan:  [place]  In-House Referral:  Clinical Social Work  Discharge planning Services  CM Consult  Post Acute Care Choice:    Choice offered to:     DME Arranged:    DME Agency:     HH Arranged:    [place] Agency:     Status of Service:  Completed, signed off  If discussed at [employer], dates discussed:    Additional Comments:  Pollie Friar, RN 01/29/2016, 11:24 AM

## 2016-01-29 NOTE — Progress Notes (Signed)
Patient is discharged from room 5M22 at this time. Alert and in stable condition. IV site d/c'd and instructions read to patient with understanding verbalized. Report also given to nurse Manuela Schwartz at [facility] with all questions answered. Left unit via wheelchair with all belongings at side. Son transported.

## 2016-01-29 NOTE — Clinical Social Work Note (Addendum)
  Clinical Social Worker facilitated patient discharge including contacting patient family and facility to confirm patient discharge plans.  Clinical information faxed to facility and family agreeable with plan.  Patient's son will be transporting patient to [facility] .  RN to call [phone] for report prior to discharge.  Clinical Social Worker will sign off for now as social work intervention is no longer needed. Please consult [place] again if new need arises.  [address], [place]

## 2016-01-30 ENCOUNTER — Telehealth: Payer: Self-pay

## 2016-01-30 NOTE — Telephone Encounter (Signed)
Possible re-admission to facility. This is a patient you were seeing at [facility], however, she might have been admitted to the SNF. [facility] F/U is needed if patient was re-admitted to facility upon discharge. Hospital discharge from [facility] on  01/29/16.

## 2016-02-01 ENCOUNTER — Non-Acute Institutional Stay (SKILLED_NURSING_FACILITY): Payer: Medicare Other | Admitting: Adult Health

## 2016-02-01 ENCOUNTER — Encounter: Payer: Self-pay | Admitting: Adult Health

## 2016-02-01 DIAGNOSIS — G9341 Metabolic encephalopathy: Secondary | ICD-10-CM | POA: Diagnosis not present

## 2016-02-01 DIAGNOSIS — I951 Orthostatic hypotension: Secondary | ICD-10-CM | POA: Diagnosis not present

## 2016-02-01 DIAGNOSIS — E871 Hypo-osmolality and hyponatremia: Secondary | ICD-10-CM | POA: Diagnosis not present

## 2016-02-01 DIAGNOSIS — R918 Other nonspecific abnormal finding of lung field: Secondary | ICD-10-CM | POA: Diagnosis not present

## 2016-02-01 DIAGNOSIS — Z170 Estrogen receptor positive status [ER+]: Secondary | ICD-10-CM

## 2016-02-01 DIAGNOSIS — C50511 Malignant neoplasm of lower-outer quadrant of right female breast: Secondary | ICD-10-CM

## 2016-02-01 DIAGNOSIS — R002 Palpitations: Secondary | ICD-10-CM

## 2016-02-01 DIAGNOSIS — I10 Essential (primary) hypertension: Secondary | ICD-10-CM

## 2016-02-01 LAB — BASIC METABOLIC PANEL
BUN: 31 mg/dL — AB (ref 4–21)
Creatinine: 0.7 mg/dL (ref 0.5–1.1)
GLUCOSE: 98 mg/dL
Potassium: 4.5 mmol/L (ref 3.4–5.3)
SODIUM: 134 mmol/L — AB (ref 137–147)

## 2016-02-01 NOTE — Progress Notes (Signed)
Location:  Occupational psychologist of Service:  SNF (31)  Provider:  Cindi Carbon, ANP [place] [phone]   PCP: [name], DO Patient Care Team: Gayland Curry, DO as PCP - General (Geriatric Medicine) Prentiss Bells, MD as Consulting Physician (Ophthalmology) Peter M Martinique, MD as Consulting Physician (Cardiology) Allyn Kenner, MD as Consulting Physician (Dermatology) Michael Boston, MD as Consulting Physician (General Surgery) [facility] Amada Kingfisher, MD (Inactive) as Consulting Physician (Hematology and Oncology) Chauncey Cruel, MD as Consulting Physician (Oncology) [name], MD as Consulting Physician (Ophthalmology)  Extended Emergency Contact Information Primary Emergency Contact: Gertha Calkin of [place] Phone: [phone] Work Phone: [phone] Mobile Phone: [phone] Relation: Son Secondary Emergency Contact: Joesphine Bare of [place] Phone: [phone] Mobile Phone: [phone] Relation: Son  Code Status: DNR Goals of care:  Advanced Directive information Advanced Directives 01/28/2016  Does Patient Have a Medical Advance Directive? No  Type of Advance Directive -  Does patient want to make changes to medical advance directive? -  Copy of [place] in Chart? -  Would patient like information on creating a medical advance directive? No - Patient declined     No Known Allergies  Chief Complaint  Patient presents with  . Discharge Note    HPI:  83 y.o. female  Residing at [facility] rehab after a hospitalization (99991111) acute metabolic encephalopathy secondary to hyponatremia.  She had a gastroenteritis prior to admission and was found on arrival to have a NA of 107.  Her baseline is 130.  She was admitted to the ICU and given 3% saline. Na on discharge was 129. CT of the head was negative for acute pathology. Mentation  cleared after treatment of Na.  Nephrology felt metabolic-? 2/2 reset osmostat 2/2 to excess free water load+/- SIADH.  She was discharged on Lasix 20 mg qd.  PTH, Cortisol, TSH WNL.   She was admitted to rehab for additional monitoring and therapy due to weakness. She lives in [place] by herself but has support from family.  Therapy reports that she is ambulating independently.  Staff report that she is eating and drinking well. She is requesting d/c in the am when her daughter arrives to help her transition back home.  Orthostatics were ordered this am and showed the following 114/61 58 lying 90/57 65 sitting 85/56 67 standing She was not symptomatic.  She has a hx of palpitations and hospital records indicate that the atenolol is necessary for rate control. 12 lead EKG showed SR MMSE was 28/30 on arrival to rehab, due to error in serial 7's. She reports that there are no issues urinating or having BMs. BMP today shows NA of 134, BUN 31/Cr 0.66 Glucose was elevated during her hospitalization but was 98 fasting on BMP 02/01/2016 Noted hx of breast CA s/p lumpectomy, on tamoxifen CT of the chest during her hospital stay showed multiple tiny <5 mm pulmonary nodules. Given her hx of breast ca, f/u CT of the chest was recommended   Past Medical History:  Diagnosis Date  . Acute upper respiratory infections of unspecified site 06/09/2011  . Breast cancer ([place]) 12/11/2008  . Fatigue 01/2012  . Hypercholesterolemia    on lipitor  . Hypertension   . Hyponatremia 01/2016  . Mitral valve prolapse   . Other abnormal blood chemistry 01/21/2010  . Palpitations 08/05/2000  . Right bundle branch block   . Senile osteoporosis 09/28/2001  Past Surgical History:  Procedure Laterality Date  . BREAST LUMPECTOMY Right 12/27/2007  . LAPAROSCOPIC LYSIS OF ADHESIONS  07/15/2006   [name]  . OTHER SURGICAL HISTORY    . TONSILLECTOMY    . [place]      reports that she quit smoking about 61 years  ago. She has never used smokeless tobacco. She reports that she drinks alcohol. She reports that she does not use drugs. Social History   Social History  . Marital status: Married    Spouse name: N/A  . Number of children: 3  . Years of education: N/A   Occupational History  . Retired Pharmacist, hospital Retired   Social History Main Topics  . Smoking status: Former Smoker    Quit date: 08/06/1954  . Smokeless tobacco: Never Used  . Alcohol use Yes     Comment: Occasionally   . Drug use: No  . Sexual activity: No   Other Topics Concern  . Not on file   Social History Narrative   Lives at [facility] since 05/1998   Widowed   Living will   Former smoker, stopped 1956   Alcohol  Rare   Exercise - walk one hour daily, 3 days of stretching and strengthen         Functional Status Survey:    No Known Allergies  Pertinent  Health Maintenance Due  Topic Date Due  . MAMMOGRAM  12/18/2015  . INFLUENZA VACCINE  Completed  . DEXA SCAN  Completed  . PNA vac Low Risk Adult  Completed    Medications: Allergies as of 02/01/2016   No Known Allergies     Medication List       Accurate as of 02/01/16  3:41 PM. Always use your most recent med list.          atenolol 25 MG tablet Commonly known as:  TENORMIN Take 0.5 tablets (12.5 mg total) by mouth daily.   Biotin 5000 MCG Caps Take 1 capsule by mouth daily.   CALCIUM 600 600 MG Tabs tablet Generic drug:  calcium carbonate Take 600 mg by mouth daily with breakfast.   furosemide 20 MG tablet Commonly known as:  LASIX Take 1 tablet (20 mg total) by mouth daily.   polyethylene glycol packet Commonly known as:  MIRALAX / GLYCOLAX Take 17 g by mouth daily as needed for mild constipation.   potassium chloride SA 20 MEQ tablet Commonly known as:  K-DUR,KLOR-CON Take 1 tablet (20 mEq total) by mouth daily.   tamoxifen 20 MG tablet Commonly known as:  NOLVADEX Take 1 tablet (20 mg total) by mouth daily.   Vitamin D3 1000  units Caps Take 1,000 Units by mouth daily.       Review of Systems  Constitutional: Negative for activity change, appetite change, chills, diaphoresis, fatigue, fever and unexpected weight change.  HENT: Negative for congestion.   Respiratory: Negative for cough, shortness of breath and wheezing.   Cardiovascular: Positive for palpitations (h/o, denies currently). Negative for chest pain and leg swelling.  Gastrointestinal: Negative for abdominal distention, abdominal pain, constipation and diarrhea.  Genitourinary: Negative for difficulty urinating and dysuria.  Musculoskeletal: Positive for arthralgias. Negative for back pain, gait problem, joint swelling and myalgias.  Neurological: Negative for dizziness, tremors, seizures, syncope, facial asymmetry, speech difficulty, weakness, light-headedness, numbness and headaches.  Psychiatric/Behavioral: Negative for agitation, behavioral problems and confusion.    Vitals:   02/01/16 1539  BP: 114/61  Pulse: (!) 53  Resp: 20  Temp: 97.8 F (36.6 C)  SpO2: 97%  Weight: 104 lb (47.2 kg)   Body mass index is 19.02 kg/m. Physical Exam  Constitutional: She is oriented to person, place, and time. No distress.  HENT:  Head: Normocephalic and atraumatic.  Neck: No JVD present.  Cardiovascular: Normal rate and regular rhythm.   No murmur heard. No edema  Pulmonary/Chest: Effort normal and breath sounds normal. No respiratory distress. She has no wheezes.  Abdominal: Soft. Bowel sounds are normal. She exhibits no distension.  Neurological: She is alert and oriented to person, place, and time.  Skin: Skin is warm and dry. She is not diaphoretic.  Psychiatric: She has a normal mood and affect.  Nursing note and vitals reviewed.   Labs reviewed: Basic Metabolic Panel:  Recent Labs  01/23/16 0815 01/23/16 1025  01/24/16 0301  01/25/16 0543  01/26/16 1036  01/27/16 0241  01/28/16 1806 01/29/16 0605 02/01/16  NA 106* 107*  < >  112*  < > 122*  < > 121*  < > 126*  < > 129* 129* 134*  K 3.5 3.6  < >  --   < >  --   --  3.6  --  3.8  --   --  3.4* 4.5  CL 73* 75*  < >  --   < >  --   --  89*  --  91*  --   --  91*  --   CO2 20* 20*  < >  --   < >  --   --  20*  --  27  --   --  29  --   GLUCOSE 153* 149*  < >  --   < >  --   --  122*  --  90  --   --  93  --   BUN 7 6  < >  --   < >  --   --  21*  --  23*  --   --  25* 31*  CREATININE 0.60 0.60  < >  --   < >  --   --  0.95  --  0.91  --   --  0.88 0.7  CALCIUM 7.6* 7.5*  < >  --   < >  --   --  8.9  --  8.7*  --   --  8.7*  --   MG 1.4*  --   --  2.1  --  2.4  --   --   --   --   --   --   --   --   PHOS  --  2.2*  --  2.0*  --  2.1*  --   --   --   --   --   --   --   --   < > = values in this interval not displayed. Liver Function Tests:  Recent Labs  09/24/15 1514 01/23/16 0558 01/27/16 0241  AST 24 31 27   ALT 17 18 22   ALKPHOS 48 39 43  BILITOT 0.37 1.2 0.7  PROT 6.2* 6.0* 5.5*  ALBUMIN 3.5 3.7 3.2*    Recent Labs  01/23/16 0558  LIPASE 21   No results for input(s): AMMONIA in the last 8760 hours. CBC:  Recent Labs  09/24/15 1514  01/23/16 0558  01/23/16 0815 01/24/16 0301 01/25/16 0543  WBC 6.5  < > 3.4*  --  4.1 8.2 5.9  NEUTROABS 3.9  --  2.6  --   --   --   --   HGB 12.7  --  13.2  < > 13.1 13.8 14.7  HCT 38.0  --  35.2*  < > 34.2* 36.4 38.9  MCV 99.5  < > 88.4  --  87.0 88.6 88.6  PLT 232  --  149*  --  155 219 227  < > = values in this interval not displayed. Cardiac Enzymes: No results for input(s): CKTOTAL, CKMB, CKMBINDEX, TROPONINI in the last 8760 hours. BNP: Invalid input(s): POCBNP CBG: No results for input(s): GLUCAP in the last 8760 hours.  Procedures and Imaging Studies During Stay: Ct Angio Head W Or Wo Contrast  Result Date: 01/23/2016 CLINICAL DATA:  83 year old female with global aphasia. Initial encounter. EXAM: CT ANGIOGRAPHY HEAD AND NECK CT HEAD PERFUSION TECHNIQUE: Multiphase CT imaging of the brain was  performed following IV bolus contrast injection. Subsequent parametric perfusion maps were calculated using RAPID software. Multidetector CT imaging of the head and neck was performed using the standard protocol during bolus administration of intravenous contrast. Multiplanar CT image reconstructions and MIPs were obtained to evaluate the vascular anatomy. Carotid stenosis measurements (when applicable) are obtained utilizing NASCET criteria, using the distal internal carotid diameter as the denominator. CONTRAST:  100 mL Isovue 370 COMPARISON:  None. FINDINGS: CT HEAD FINDINGS Brain: No midline shift, ventriculomegaly, mass effect, evidence of mass lesion, intracranial hemorrhage or evidence of cortically based acute infarction. Gray-white matter differentiation is within normal limits throughout the brain. No cortical encephalomalacia identified. ASPECTS 10. Vascular: See below. Skull: No acute osseous abnormality identified. Sinuses: Clear. Orbits: Postoperative changes to both globes. No acute orbit or scalp soft tissue finding. CT BRAIN PERFUSION FINDINGS: CBF (<30%) Volume:  0 mL Perfusion (Tmax>6.0s) volume: 8 mL (in the left cerebellar hemisphere, likely artifact). 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 No acute osseous abnormality identified. Upper chest: Negative lung apices ; mild apical scarring and dependent atelectasis. Other neck: Negative thyroid. Larynx, pharynx, parapharyngeal spaces, retropharyngeal space, sublingual space, submandibular glands and parotid glands are within normal limits. No lymphadenopathy. Aortic arch: 3 vessel arch configuration. Minimal arch atherosclerosis. No great vessel origin stenosis. Right carotid system: Negative except for tortuosity and minimal right carotid bifurcation atherosclerosis. Left carotid system: Negative except for tortuosity in minimal left carotid  bifurcation atherosclerosis. Vertebral arteries:No proximal subclavian artery stenosis. Normal right vertebral artery origin. Tortuous right V1 segment which is in proximity to thyrocervical branches but as expected does not appear to give off any branches. Tortuous right V2 segment. Tortuous right V3 segment. No vertebral artery stenosis to the skullbase. Normal left vertebral artery origin. Tortuous left V1 segment. Tortuous left V2 and V3 segments. No left vertebral artery stenosis to the skullbase. CTA HEAD Posterior circulation: Both vertebral artery V4 segments and the basilar are diminutive. Both PICA origins appear patent. Mild fenestration of the vertebrobasilar junction. Diminutive basilar artery without stenosis. Fetal type bilateral PCA origins. SCA origins are patent. The right PCA branches are normal. There is mild irregularity of the left PCA P2 and P3 segments with preserved distal enhancement. Anterior circulation: Both ICA siphons are patent. Both cavernous segments are tortuous. Mild bilateral siphon calcified plaque without stenosis. Ophthalmic and posterior communicating artery origins are normal. Normal carotid termini, MCA and ACA origin. Anterior communicating artery and bilateral ACA branches are within normal limits.  There is a small median artery of the corpus callosum. Right MCA M1 segment, trifurcation, and right MCA branches are within normal limits. Left MCA M1 segment is normal except for an infundibulum of the lenticulostriate origin (series 605, image 21). The left MCA bifurcation is patent. Left MCA branches appear within normal limits. No left MCA branch occlusion is identified. Venous sinuses: Insufficient venous contrast on these images. Anatomic variants: Fetal type bilateral PCA origins with diminutive distal vertebral arteries and basilar. Review of the MIP images confirms the above findings IMPRESSION: 1.  Normal noncontrast CT appearance of the brain.  ASPECTS 10. 2.  Negative for emergent large vessel occlusion. CT Perfusion data analysis is negative for infarct core or convincing penumbra. 3. The above was discussed by telephone with Dr. Roland Rack on 01/23/2016 at 0648 hours. 4. Tortuous cervical carotid and vertebral arteries with minimal atherosclerosis and no stenosis. 5. Tortuous ICA siphons with minimal atherosclerosis and no stenosis. Otherwise negative anterior circulation; small left MCA M1 infundibulum of the left lenticulostriate artery origin (normal variant). 6. Diminutive vertebrobasilar system on the basis of fetal type PCA origins. Mild atherosclerosis and stenosis of the left PCA (P2 and P3 segments). Otherwise negative posterior circulation. Electronically Signed   By: Genevie Ann M.D.   On: 01/23/2016 07:14   Ct Head Wo Contrast  Result Date: 01/23/2016 CLINICAL DATA:  83 year old female with global aphasia. Initial encounter. EXAM: CT ANGIOGRAPHY HEAD AND NECK CT HEAD PERFUSION TECHNIQUE: Multiphase CT imaging of the brain was performed following IV bolus contrast injection. Subsequent parametric perfusion maps were calculated using RAPID software. Multidetector CT imaging of the head and neck was performed using the standard protocol during bolus administration of intravenous contrast. Multiplanar CT image reconstructions and MIPs were obtained to evaluate the vascular anatomy. Carotid stenosis measurements (when applicable) are obtained utilizing NASCET criteria, using the distal internal carotid diameter as the denominator. CONTRAST:  100 mL Isovue 370 COMPARISON:  None. FINDINGS: CT HEAD FINDINGS Brain: No midline shift, ventriculomegaly, mass effect, evidence of mass lesion, intracranial hemorrhage or evidence of cortically based acute infarction. Gray-white matter differentiation is within normal limits throughout the brain. No cortical encephalomalacia identified. ASPECTS 10. Vascular: See below. Skull: No acute osseous abnormality identified.  Sinuses: Clear. Orbits: Postoperative changes to both globes. No acute orbit or scalp soft tissue finding. CT BRAIN PERFUSION FINDINGS: CBF (<30%) Volume:  0 mL Perfusion (Tmax>6.0s) volume: 8 mL (in the left cerebellar hemisphere, likely artifact). 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 No acute osseous abnormality identified. Upper chest: Negative lung apices ; mild apical scarring and dependent atelectasis. Other neck: Negative thyroid. Larynx, pharynx, parapharyngeal spaces, retropharyngeal space, sublingual space, submandibular glands and parotid glands are within normal limits. No lymphadenopathy. Aortic arch: 3 vessel arch configuration. Minimal arch atherosclerosis. No great vessel origin stenosis. Right carotid system: Negative except for tortuosity and minimal right carotid bifurcation atherosclerosis. Left carotid system: Negative except for tortuosity in minimal left carotid bifurcation atherosclerosis. Vertebral arteries:No proximal subclavian artery stenosis. Normal right vertebral artery origin. Tortuous right V1 segment which is in proximity to thyrocervical branches but as expected does not appear to give off any branches. Tortuous right V2 segment. Tortuous right V3 segment. No vertebral artery stenosis to the skullbase. Normal left vertebral artery origin. Tortuous left V1 segment. Tortuous left V2 and V3 segments. No left vertebral artery stenosis to the skullbase. CTA HEAD Posterior circulation: Both vertebral  artery V4 segments and the basilar are diminutive. Both PICA origins appear patent. Mild fenestration of the vertebrobasilar junction. Diminutive basilar artery without stenosis. Fetal type bilateral PCA origins. SCA origins are patent. The right PCA branches are normal. There is mild irregularity of the left PCA P2 and P3 segments with preserved distal enhancement. Anterior circulation: Both  ICA siphons are patent. Both cavernous segments are tortuous. Mild bilateral siphon calcified plaque without stenosis. Ophthalmic and posterior communicating artery origins are normal. Normal carotid termini, MCA and ACA origin. Anterior communicating artery and bilateral ACA branches are within normal limits. There is a small median artery of the corpus callosum. Right MCA M1 segment, trifurcation, and right MCA branches are within normal limits. Left MCA M1 segment is normal except for an infundibulum of the lenticulostriate origin (series 605, image 21). The left MCA bifurcation is patent. Left MCA branches appear within normal limits. No left MCA branch occlusion is identified. Venous sinuses: Insufficient venous contrast on these images. Anatomic variants: Fetal type bilateral PCA origins with diminutive distal vertebral arteries and basilar. Review of the MIP images confirms the above findings IMPRESSION: 1.  Normal noncontrast CT appearance of the brain.  ASPECTS 10. 2. Negative for emergent large vessel occlusion. CT Perfusion data analysis is negative for infarct core or convincing penumbra. 3. The above was discussed by telephone with Dr. Roland Rack on 01/23/2016 at 0648 hours. 4. Tortuous cervical carotid and vertebral arteries with minimal atherosclerosis and no stenosis. 5. Tortuous ICA siphons with minimal atherosclerosis and no stenosis. Otherwise negative anterior circulation; small left MCA M1 infundibulum of the left lenticulostriate artery origin (normal variant). 6. Diminutive vertebrobasilar system on the basis of fetal type PCA origins. Mild atherosclerosis and stenosis of the left PCA (P2 and P3 segments). Otherwise negative posterior circulation. Electronically Signed   By: Genevie Ann M.D.   On: 01/23/2016 07:14   Ct Angio Neck W Or Wo Contrast  Result Date: 01/23/2016 CLINICAL DATA:  83 year old female with global aphasia. Initial encounter. EXAM: CT ANGIOGRAPHY HEAD AND NECK CT  HEAD PERFUSION TECHNIQUE: Multiphase CT imaging of the brain was performed following IV bolus contrast injection. Subsequent parametric perfusion maps were calculated using RAPID software. Multidetector CT imaging of the head and neck was performed using the standard protocol during bolus administration of intravenous contrast. Multiplanar CT image reconstructions and MIPs were obtained to evaluate the vascular anatomy. Carotid stenosis measurements (when applicable) are obtained utilizing NASCET criteria, using the distal internal carotid diameter as the denominator. CONTRAST:  100 mL Isovue 370 COMPARISON:  None. FINDINGS: CT HEAD FINDINGS Brain: No midline shift, ventriculomegaly, mass effect, evidence of mass lesion, intracranial hemorrhage or evidence of cortically based acute infarction. Gray-white matter differentiation is within normal limits throughout the brain. No cortical encephalomalacia identified. ASPECTS 10. Vascular: See below. Skull: No acute osseous abnormality identified. Sinuses: Clear. Orbits: Postoperative changes to both globes. No acute orbit or scalp soft tissue finding. CT BRAIN PERFUSION FINDINGS: CBF (<30%) Volume:  0 mL Perfusion (Tmax>6.0s) volume: 8 mL (in the left cerebellar hemisphere, likely artifact). 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 No acute osseous abnormality identified. Upper chest: Negative lung apices ; mild apical scarring and dependent atelectasis. Other neck: Negative thyroid. Larynx, pharynx, parapharyngeal spaces, retropharyngeal space, sublingual space, submandibular glands and parotid glands are within normal limits. No lymphadenopathy. Aortic arch: 3 vessel arch configuration. Minimal arch atherosclerosis. No great vessel origin stenosis.  Right carotid system: Negative except for tortuosity and minimal right carotid bifurcation atherosclerosis. Left carotid  system: Negative except for tortuosity in minimal left carotid bifurcation atherosclerosis. Vertebral arteries:No proximal subclavian artery stenosis. Normal right vertebral artery origin. Tortuous right V1 segment which is in proximity to thyrocervical branches but as expected does not appear to give off any branches. Tortuous right V2 segment. Tortuous right V3 segment. No vertebral artery stenosis to the skullbase. Normal left vertebral artery origin. Tortuous left V1 segment. Tortuous left V2 and V3 segments. No left vertebral artery stenosis to the skullbase. CTA HEAD Posterior circulation: Both vertebral artery V4 segments and the basilar are diminutive. Both PICA origins appear patent. Mild fenestration of the vertebrobasilar junction. Diminutive basilar artery without stenosis. Fetal type bilateral PCA origins. SCA origins are patent. The right PCA branches are normal. There is mild irregularity of the left PCA P2 and P3 segments with preserved distal enhancement. Anterior circulation: Both ICA siphons are patent. Both cavernous segments are tortuous. Mild bilateral siphon calcified plaque without stenosis. Ophthalmic and posterior communicating artery origins are normal. Normal carotid termini, MCA and ACA origin. Anterior communicating artery and bilateral ACA branches are within normal limits. There is a small median artery of the corpus callosum. Right MCA M1 segment, trifurcation, and right MCA branches are within normal limits. Left MCA M1 segment is normal except for an infundibulum of the lenticulostriate origin (series 605, image 21). The left MCA bifurcation is patent. Left MCA branches appear within normal limits. No left MCA branch occlusion is identified. Venous sinuses: Insufficient venous contrast on these images. Anatomic variants: Fetal type bilateral PCA origins with diminutive distal vertebral arteries and basilar. Review of the MIP images confirms the above findings IMPRESSION: 1.  Normal  noncontrast CT appearance of the brain.  ASPECTS 10. 2. Negative for emergent large vessel occlusion. CT Perfusion data analysis is negative for infarct core or convincing penumbra. 3. The above was discussed by telephone with Dr. Roland Rack on 01/23/2016 at 0648 hours. 4. Tortuous cervical carotid and vertebral arteries with minimal atherosclerosis and no stenosis. 5. Tortuous ICA siphons with minimal atherosclerosis and no stenosis. Otherwise negative anterior circulation; small left MCA M1 infundibulum of the left lenticulostriate artery origin (normal variant). 6. Diminutive vertebrobasilar system on the basis of fetal type PCA origins. Mild atherosclerosis and stenosis of the left PCA (P2 and P3 segments). Otherwise negative posterior circulation. Electronically Signed   By: Genevie Ann M.D.   On: 01/23/2016 07:14   Ct Chest Wo Contrast  Result Date: 01/25/2016 CLINICAL DATA:  83 year old female with history of hepatic encephalopathy and hyponatremia. History of breast cancer status post radiation therapy and chemotherapy. EXAM: CT CHEST WITHOUT CONTRAST TECHNIQUE: Multidetector CT imaging of the chest was performed following the standard protocol without IV contrast. COMPARISON:  No priors. FINDINGS: Cardiovascular: Heart size is normal. There is no significant pericardial fluid, thickening or pericardial calcification. There is aortic atherosclerosis, as well as atherosclerosis of the great vessels of the mediastinum and the coronary arteries, including calcified atherosclerotic plaque in the left main, left anterior descending, left circumflex and right coronary arteries. Calcifications of the aortic valve. Mediastinum/Nodes: No pathologically enlarged mediastinal or hilar lymph nodes. Please note that accurate exclusion of hilar adenopathy is limited on noncontrast CT scans. Densely calcified right hilar lymph node incidentally noted. Esophagus is unremarkable in appearance. No axillary  lymphadenopathy. Lungs/Pleura: A few scattered tiny calcified granulomas are noted. Several other 2-3 mm pulmonary nodules are seen scattered throughout  the lungs bilaterally, highly nonspecific, but statistically likely to represent benign areas of mucoid impaction within terminal bronchioles. 5 mm subpleural nodule in the medial aspect of the superior segment of the left lower lobe (image 67 of series 7). No other larger more suspicious appearing pulmonary nodules or masses are noted. Mild scarring in the dependent portions of the lower lobes of the lungs bilaterally. No acute consolidative airspace disease. No pleural effusions. Upper Abdomen: Aortic atherosclerosis. Numerous calcified granulomas throughout the spleen. Musculoskeletal: There are no aggressive appearing lytic or blastic lesions noted in the visualized portions of the skeleton. IMPRESSION: 1. No acute findings in the thorax. 2. Multiple tiny pulmonary nodules measuring 5 mm or less in size, favored to be benign. However, given the patient's history breast cancer, noncontrast chest CT is recommended in 1 year to ensure the stability of these findings. This recommendation follows the consensus statement: Guidelines for Management of Incidental Pulmonary Nodules Detected on CT Images: From the Fleischner Society 2017; Radiology 2017; 284:228-243. 3. Aortic atherosclerosis, in addition to left main and 3 vessel coronary artery disease. Please note that although the presence of coronary artery calcium documents the presence of coronary artery disease, the severity of this disease and any potential stenosis cannot be assessed on this non-gated CT examination. Assessment for potential risk factor modification, dietary therapy or pharmacologic therapy may be warranted, if clinically indicated. 4. There are calcifications of the aortic valve. Echocardiographic correlation for evaluation of potential valvular dysfunction may be warranted if clinically  indicated. 5. Old granulomatous disease, as above. Electronically Signed   By: Vinnie Langton M.D.   On: 01/25/2016 13:21   Ct Cerebral Perfusion W Contrast  Result Date: 01/23/2016 CLINICAL DATA:  83 year old female with global aphasia. Initial encounter. EXAM: CT ANGIOGRAPHY HEAD AND NECK CT HEAD PERFUSION TECHNIQUE: Multiphase CT imaging of the brain was performed following IV bolus contrast injection. Subsequent parametric perfusion maps were calculated using RAPID software. Multidetector CT imaging of the head and neck was performed using the standard protocol during bolus administration of intravenous contrast. Multiplanar CT image reconstructions and MIPs were obtained to evaluate the vascular anatomy. Carotid stenosis measurements (when applicable) are obtained utilizing NASCET criteria, using the distal internal carotid diameter as the denominator. CONTRAST:  100 mL Isovue 370 COMPARISON:  None. FINDINGS: CT HEAD FINDINGS Brain: No midline shift, ventriculomegaly, mass effect, evidence of mass lesion, intracranial hemorrhage or evidence of cortically based acute infarction. Gray-white matter differentiation is within normal limits throughout the brain. No cortical encephalomalacia identified. ASPECTS 10. Vascular: See below. Skull: No acute osseous abnormality identified. Sinuses: Clear. Orbits: Postoperative changes to both globes. No acute orbit or scalp soft tissue finding. CT BRAIN PERFUSION FINDINGS: CBF (<30%) Volume:  0 mL Perfusion (Tmax>6.0s) volume: 8 mL (in the left cerebellar hemisphere, likely artifact). 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 No acute osseous abnormality identified. Upper chest: Negative lung apices ; mild apical scarring and dependent atelectasis. Other neck: Negative thyroid. Larynx, pharynx, parapharyngeal spaces, retropharyngeal space, sublingual space, submandibular  glands and parotid glands are within normal limits. No lymphadenopathy. Aortic arch: 3 vessel arch configuration. Minimal arch atherosclerosis. No great vessel origin stenosis. Right carotid system: Negative except for tortuosity and minimal right carotid bifurcation atherosclerosis. Left carotid system: Negative except for tortuosity in minimal left carotid bifurcation atherosclerosis. Vertebral arteries:No proximal subclavian artery stenosis. Normal right vertebral artery origin. Tortuous right V1 segment which is in  proximity to thyrocervical branches but as expected does not appear to give off any branches. Tortuous right V2 segment. Tortuous right V3 segment. No vertebral artery stenosis to the skullbase. Normal left vertebral artery origin. Tortuous left V1 segment. Tortuous left V2 and V3 segments. No left vertebral artery stenosis to the skullbase. CTA HEAD Posterior circulation: Both vertebral artery V4 segments and the basilar are diminutive. Both PICA origins appear patent. Mild fenestration of the vertebrobasilar junction. Diminutive basilar artery without stenosis. Fetal type bilateral PCA origins. SCA origins are patent. The right PCA branches are normal. There is mild irregularity of the left PCA P2 and P3 segments with preserved distal enhancement. Anterior circulation: Both ICA siphons are patent. Both cavernous segments are tortuous. Mild bilateral siphon calcified plaque without stenosis. Ophthalmic and posterior communicating artery origins are normal. Normal carotid termini, MCA and ACA origin. Anterior communicating artery and bilateral ACA branches are within normal limits. There is a small median artery of the corpus callosum. Right MCA M1 segment, trifurcation, and right MCA branches are within normal limits. Left MCA M1 segment is normal except for an infundibulum of the lenticulostriate origin (series 605, image 21). The left MCA bifurcation is patent. Left MCA branches appear within  normal limits. No left MCA branch occlusion is identified. Venous sinuses: Insufficient venous contrast on these images. Anatomic variants: Fetal type bilateral PCA origins with diminutive distal vertebral arteries and basilar. Review of the MIP images confirms the above findings IMPRESSION: 1.  Normal noncontrast CT appearance of the brain.  ASPECTS 10. 2. Negative for emergent large vessel occlusion. CT Perfusion data analysis is negative for infarct core or convincing penumbra. 3. The above was discussed by telephone with Dr. Roland Rack on 01/23/2016 at 0648 hours. 4. Tortuous cervical carotid and vertebral arteries with minimal atherosclerosis and no stenosis. 5. Tortuous ICA siphons with minimal atherosclerosis and no stenosis. Otherwise negative anterior circulation; small left MCA M1 infundibulum of the left lenticulostriate artery origin (normal variant). 6. Diminutive vertebrobasilar system on the basis of fetal type PCA origins. Mild atherosclerosis and stenosis of the left PCA (P2 and P3 segments). Otherwise negative posterior circulation. Electronically Signed   By: Genevie Ann M.D.   On: 01/23/2016 07:14    Assessment/Plan:    1. Acute metabolic encephalopathy Resolved with resolution of low NA  2. Hyponatremia Improved Continue lasix 20 mg qd out pt, f/u with Dr Mariea Clonts in 1-2 weeks for BMP check and eval for continued use of lasix  3. Orthostasis Noted today but not symptomatic Instructed to rise and change positions slowly  4. Essential hypertension, benign Controlled Continue atneolol  5. Palpitations Denies issues  Continue atenolol 12.5 qd  6. Malignant neoplasm of lower-outer quadrant of right breast of female, estrogen receptor positive ([place]) Continue Tamoxifen  7. Multiple pulmonary nodules Noted on CT, very small Will defer to PCP as to whether additional CT scans are warranted   Resident will discharge in the am with support from her daughter.  She has improved  significantly but will need close monitoring out pt.  Patient is being discharged with the following home health services:  none  Patient is being discharged with the following durable medical equipment:  none  Patient has been advised to f/u with their PCP in 1-2 weeks to for a transitions of care visit.  Social services at their facility was responsible for arranging this appointment.  Pt was provided with adequate prescriptions of noncontrolled medications to reach the scheduled appointment .  For controlled substances, a limited supply was provided as appropriate for the individual patient.  If the pt normally receives these medications from a pain clinic or has a contract with another physician, these medications should be received from that clinic or physician only).    Future labs/tests needed:  BMP

## 2016-02-04 ENCOUNTER — Emergency Department (HOSPITAL_COMMUNITY): Payer: Medicare Other

## 2016-02-04 ENCOUNTER — Encounter (HOSPITAL_COMMUNITY): Payer: Self-pay

## 2016-02-04 ENCOUNTER — Telehealth: Payer: Self-pay | Admitting: *Deleted

## 2016-02-04 ENCOUNTER — Other Ambulatory Visit: Payer: Self-pay

## 2016-02-04 ENCOUNTER — Emergency Department (HOSPITAL_COMMUNITY)
Admission: EM | Admit: 2016-02-04 | Discharge: 2016-02-04 | Disposition: A | Discharge status: Home / Self Care | Payer: Medicare Other | Attending: Emergency Medicine | Admitting: Emergency Medicine

## 2016-02-04 DIAGNOSIS — I10 Essential (primary) hypertension: Secondary | ICD-10-CM | POA: Diagnosis not present

## 2016-02-04 DIAGNOSIS — R0602 Shortness of breath: Secondary | ICD-10-CM | POA: Diagnosis not present

## 2016-02-04 DIAGNOSIS — Z79899 Other long term (current) drug therapy: Secondary | ICD-10-CM | POA: Diagnosis not present

## 2016-02-04 DIAGNOSIS — R0600 Dyspnea, unspecified: Secondary | ICD-10-CM

## 2016-02-04 DIAGNOSIS — Z853 Personal history of malignant neoplasm of breast: Secondary | ICD-10-CM | POA: Diagnosis not present

## 2016-02-04 DIAGNOSIS — R55 Syncope and collapse: Secondary | ICD-10-CM | POA: Diagnosis not present

## 2016-02-04 DIAGNOSIS — Z87891 Personal history of nicotine dependence: Secondary | ICD-10-CM | POA: Diagnosis not present

## 2016-02-04 DIAGNOSIS — R002 Palpitations: Secondary | ICD-10-CM

## 2016-02-04 DIAGNOSIS — R0902 Hypoxemia: Secondary | ICD-10-CM | POA: Diagnosis not present

## 2016-02-04 DIAGNOSIS — I4510 Unspecified right bundle-branch block: Secondary | ICD-10-CM

## 2016-02-04 LAB — CBC WITH DIFFERENTIAL/PLATELET
Basophils Absolute: 0 10*3/uL (ref 0.0–0.1)
Basophils Relative: 0 %
EOS ABS: 0 10*3/uL (ref 0.0–0.7)
Eosinophils Relative: 0 %
HCT: 33.4 % — ABNORMAL LOW (ref 36.0–46.0)
HEMOGLOBIN: 11.5 g/dL — AB (ref 12.0–15.0)
Lymphocytes Relative: 28 %
Lymphs Abs: 2.1 10*3/uL (ref 0.7–4.0)
MCH: 33 pg (ref 26.0–34.0)
MCHC: 34.4 g/dL (ref 30.0–36.0)
MCV: 96 fL (ref 78.0–100.0)
MONOS PCT: 9 %
Monocytes Absolute: 0.7 10*3/uL (ref 0.1–1.0)
NEUTROS PCT: 63 %
Neutro Abs: 4.8 10*3/uL (ref 1.7–7.7)
Platelets: 303 10*3/uL (ref 150–400)
RBC: 3.48 MIL/uL — ABNORMAL LOW (ref 3.87–5.11)
RDW: 13.7 % (ref 11.5–15.5)
WBC: 7.7 10*3/uL (ref 4.0–10.5)

## 2016-02-04 LAB — URINALYSIS, ROUTINE W REFLEX MICROSCOPIC
Bilirubin Urine: NEGATIVE
Glucose, UA: NEGATIVE mg/dL
KETONES UR: NEGATIVE mg/dL
LEUKOCYTES UA: NEGATIVE
Nitrite: NEGATIVE
PROTEIN: NEGATIVE mg/dL
Specific Gravity, Urine: 1.004 — ABNORMAL LOW (ref 1.005–1.030)
pH: 8 (ref 5.0–8.0)

## 2016-02-04 LAB — BASIC METABOLIC PANEL
Anion gap: 5 (ref 5–15)
BUN: 20 mg/dL (ref 6–20)
CHLORIDE: 102 mmol/L (ref 101–111)
CO2: 26 mmol/L (ref 22–32)
CREATININE: 0.73 mg/dL (ref 0.44–1.00)
Calcium: 8.4 mg/dL — ABNORMAL LOW (ref 8.9–10.3)
GFR calc Af Amer: >60 mL/min (ref >60)
GFR calc non Af Amer: >60 mL/min (ref >60)
GLUCOSE: 94 mg/dL (ref 65–99)
Potassium: 4.9 mmol/L (ref 3.5–5.1)
SODIUM: 133 mmol/L — AB (ref 135–145)

## 2016-02-04 LAB — BRAIN NATRIURETIC PEPTIDE: B NATRIURETIC PEPTIDE 5: 150.2 pg/mL — AB (ref 0.0–100.0)

## 2016-02-04 LAB — TROPONIN I: Troponin I: <0.03 ng/mL (ref <0.03)

## 2016-02-04 MED ORDER — MELATONIN 3 MG PO CAPS: 1.0000 | ORAL_CAPSULE | Freq: Every evening | ORAL | 0 refills | Status: DC | PRN

## 2016-02-04 NOTE — ED Provider Notes (Signed)
[facility] DEPT Provider Note   CSN: [id number] Arrival date & time: 02/04/16  1158     History   Chief Complaint Chief Complaint  Patient presents with  . Shortness of Breath    HPI Cynthia Watkins is a 83 y.o. female.  83 year old female with a history of breast cancer, hyperlipidemia, hyponatremia and urinary tract infections presents to the emergency department today secondary to near syncope. Patient states and her family helps supply the history that she was having some significant emotions and she started taking fast deep breaths for a couple minutes and she started to feel lightheaded and she did not pass out so she laid down and has continued so EMS was called and brought her here for evaluation. On my evaluation the patient feels like she is in normal condition. She has no chest pain or shortness of breath. She is not anxious. She does not have any other complaints at this time. No other associated symptoms. No other modifying factors.  No history of the same. Has not tried any thing for the symptoms.      Past Medical History:  Diagnosis Date  . Acute upper respiratory infections of unspecified site 06/09/2011  . Breast cancer ([place]) 12/11/2008  . Fatigue 01/2012  . Hypercholesterolemia    on lipitor  . Hypertension   . Hyponatremia 01/2016  . Mitral valve prolapse   . Other abnormal blood chemistry 01/21/2010  . Palpitations 08/05/2000  . Right bundle branch block   . Senile osteoporosis 09/28/2001    Patient Active Problem List   Diagnosis Date Noted  . Orthostasis 02/01/2016  . Hyponatremia 01/23/2016  . Acute metabolic encephalopathy [id number]  . Palpitations 07/02/2015  . Essential hypertension, benign 02/14/2015  . Lightheadedness 09/18/2014  . Right-sided low back pain with sciatica 02/14/2014  . Bilateral bunions 02/14/2014  . Varicose vein of leg 09/26/2013  . Breast cancer of lower-outer quadrant of right female breast ([place]) 11/24/2012  . Edema  11/12/2012  . Senile osteoporosis 08/09/2012  . Hyperglycemia 08/09/2012  . Decreased exercise tolerance 01/29/2012  . Hypertension   . Hypercholesterolemia   . Right bundle branch block     Past Surgical History:  Procedure Laterality Date  . BREAST LUMPECTOMY Right 12/27/2007  . LAPAROSCOPIC LYSIS OF ADHESIONS  07/15/2006   [name]  . OTHER SURGICAL HISTORY    . TONSILLECTOMY    . TUBAL LIGATION  1963    OB History    No data available       Home Medications    Prior to Admission medications   Medication Sig Start Date End Date Taking? Authorizing Provider  atenolol (TENORMIN) 25 MG tablet Take 0.5 tablets (12.5 mg total) by mouth daily. 01/29/16   Nita Sells, MD  Biotin 5000 MCG CAPS Take 1 capsule by mouth daily.     Historical Provider, MD  calcium carbonate (CALCIUM 600) 600 MG TABS tablet Take 600 mg by mouth daily with breakfast.    Historical Provider, MD  Cholecalciferol (VITAMIN D3) 1000 units CAPS Take 1,000 Units by mouth daily.    Historical Provider, MD  furosemide (LASIX) 20 MG tablet Take 1 tablet (20 mg total) by mouth daily. 01/29/16   Nita Sells, MD  Melatonin 3 MG CAPS Take 1 capsule (3 mg total) by mouth at bedtime as needed (sleep). 02/04/16   Merrily Pew, MD  polyethylene glycol (MIRALAX / GLYCOLAX) packet Take 17 g by mouth daily as needed for mild constipation. 01/29/16  Nita Sells, MD  potassium chloride SA (K-DUR,KLOR-CON) 20 MEQ tablet Take 1 tablet (20 mEq total) by mouth daily. 01/29/16   Nita Sells, MD  tamoxifen (NOLVADEX) 20 MG tablet Take 1 tablet (20 mg total) by mouth daily. 10/01/15   Chauncey Cruel, MD    Family History Family History  Problem Relation Age of Onset  . Stroke Mother   . Cancer Father     colon  . Alzheimer's disease Sister     Social History Social History  Substance Use Topics  . Smoking status: Former Smoker    Quit date: 08/06/1954  . Smokeless tobacco: Never Used  .  Alcohol use Yes     Comment: Occasionally      Allergies   Patient has no known allergies.   Review of Systems Review of Systems  All other systems reviewed and are negative.    Physical Exam Updated Vital Signs BP 133/75   Pulse 73   Temp 97.9 F (36.6 C) (Oral)   Resp 18   SpO2 100%   Physical Exam  Constitutional: She is oriented to person, place, and time. She appears well-developed and well-nourished.  HENT:  Head: Normocephalic and atraumatic.  Eyes: Conjunctivae and EOM are normal.  Neck: Normal range of motion.  Cardiovascular: Normal rate and regular rhythm.   Pulmonary/Chest: Effort normal and breath sounds normal. No stridor. No respiratory distress.  Abdominal: Soft. She exhibits no distension.  Musculoskeletal: Normal range of motion. She exhibits no edema or deformity.  Neurological: She is alert and oriented to person, place, and time. No cranial nerve deficit. Coordination normal.  No altered mental status, able to give full seemingly accurate history.  Face is symmetric, EOM's intact, pupils equal and reactive, vision intact, tongue and uvula midline without deviation Upper and Lower extremity motor 5/5, intact pain perception in distal extremities, 2+ reflexes in biceps, patella and achilles tendons. Finger to nose normal, heel to shin normal. Walks without assistance or evident ataxia.   Skin: Skin is warm and dry.  Nursing note and vitals reviewed.    ED Treatments / Results  Labs (all labs ordered are listed, but only abnormal results are displayed) Labs Reviewed  CBC WITH DIFFERENTIAL/PLATELET - Abnormal; Notable for the following:       Result Value   RBC 3.48 (*)    Hemoglobin 11.5 (*)    HCT 33.4 (*)    All other components within normal limits  BASIC METABOLIC PANEL - Abnormal; Notable for the following:    Sodium 133 (*)    Calcium 8.4 (*)    All other components within normal limits  BRAIN NATRIURETIC PEPTIDE - Abnormal; Notable for  the following:    B Natriuretic Peptide 150.2 (*)    All other components within normal limits  URINALYSIS, ROUTINE W REFLEX MICROSCOPIC - Abnormal; Notable for the following:    Color, Urine STRAW (*)    Specific Gravity, Urine 1.004 (*)    Hgb urine dipstick SMALL (*)    Bacteria, UA RARE (*)    Squamous Epithelial / LPF 0-5 (*)    All other components within normal limits  TROPONIN I    EKG  EKG Interpretation  Date/Time:  Monday February 04 2016 12:02:19 EST Ventricular Rate:  57 PR Interval:    QRS Duration: 124 QT Interval:  419 QTC Calculation: 408 R Axis:   -34 Text Interpretation:  Sinus rhythm Right bundle branch block No significant change since last tracing Confirmed  by [facility] MD, Corene Cornea [phone]) on 02/04/2016 12:08:29 PM       Radiology Dg Chest 2 View  Result Date: 02/04/2016 CLINICAL DATA:  Shortness of breath. History of CHF, hypertension, breast malignancy, remote history of smoking. Previous granulomatous infection. EXAM: CHEST  2 VIEW COMPARISON:  Chest x-ray of January 16, 2012 and CT scan of the chest of January 25 2016. FINDINGS: The lungs are mildly hyperinflated. There is no focal infiltrate. There is no significant pleural effusion. The heart and pulmonary vascularity are normal. The mediastinum is normal in width. There is calcification in the wall of the aortic arch. The bony thorax exhibits no acute abnormality. IMPRESSION: COPD.  No acute pneumonia nor CHF. Thoracic aortic atherosclerosis. Electronically Signed   By: David  Martinique M.D.   On: 02/04/2016 13:15    Procedures Procedures (including critical care time)  Medications Ordered in ED Medications - No data to display   Initial Impression / Assessment and Plan / ED Course  I have reviewed the triage vital signs and the nursing notes.  Pertinent labs & imaging results that were available during my care of the patient were reviewed by me and considered in my medical decision making (see chart for  details).   I suspect the patient had a transient near syncopal episode secondary to hyperventilation. Secondary to her age and her comorbidities I did do a cardiac workup. Also monitor for 4 hours in the emergency department without any evidence of arrhythmias. I also sent a message to her cardiologist and asked for a follow-up appointment and possible echocardiogram as she does have a slightly elevated BNP. Other than that I feel like she is stable for discharge. Discussed the situation with her family and they agree and will work on getting her cardiology follow-up as well. Otherwise will follow with primary doctor for some sleep issues she is having.  Final Clinical Impressions(s) / ED Diagnoses   Final diagnoses:  Dyspnea, unspecified type  Near syncope    New Prescriptions Discharge Medication List as of 02/04/2016  4:08 PM    START taking these medications   Details  Melatonin 3 MG CAPS Take 1 capsule (3 mg total) by mouth at bedtime as needed (sleep)., Starting Mon 02/04/2016, Print         Merrily Pew, MD 02/04/16 [phone]

## 2016-02-04 NOTE — ED Triage Notes (Signed)
Per EMS - pt from [facility]. Pt reported shortness of breath. Initially hyperventilating, SpO2 93% when [facility] arrived. Denies shortness of breath at this time. Newly dx w/ CHF and started on lasix. NAD, Resp e/u at this time. Per family, pt slower to respond to questions than normal x 2 weeks. No other complaints. Recently admitted.

## 2016-02-04 NOTE — Telephone Encounter (Signed)
Dr. Irine Seal (son) is calling stating that he wants to speak with Dr. Mariea Clonts regarding his mother's medical Condition and possible medication needs. Had to take her back to the hospital today with alittle bit of a panic attack. BNP was alittle elevated. Patient has not slept since leaving the hospital last week and thinks she has some anxiety. He would like to speak with you regarding this. Patient is going, he thinks, into [facility]. Please call Dr. Jeffie Pollock [phone]

## 2016-02-04 NOTE — ED Notes (Signed)
Pt transported to xray 

## 2016-02-05 ENCOUNTER — Encounter: Payer: Self-pay | Admitting: Internal Medicine

## 2016-02-05 ENCOUNTER — Telehealth: Payer: Self-pay | Admitting: Cardiology

## 2016-02-05 ENCOUNTER — Non-Acute Institutional Stay (INDEPENDENT_AMBULATORY_CARE_PROVIDER_SITE_OTHER): Payer: Medicare Other | Admitting: Internal Medicine

## 2016-02-05 DIAGNOSIS — I951 Orthostatic hypotension: Secondary | ICD-10-CM | POA: Diagnosis not present

## 2016-02-05 DIAGNOSIS — R918 Other nonspecific abnormal finding of lung field: Secondary | ICD-10-CM

## 2016-02-05 DIAGNOSIS — R5383 Other fatigue: Secondary | ICD-10-CM

## 2016-02-05 DIAGNOSIS — E871 Hypo-osmolality and hyponatremia: Secondary | ICD-10-CM

## 2016-02-05 DIAGNOSIS — R4701 Aphasia: Secondary | ICD-10-CM | POA: Diagnosis not present

## 2016-02-05 DIAGNOSIS — F4322 Adjustment disorder with anxiety: Secondary | ICD-10-CM | POA: Diagnosis not present

## 2016-02-05 NOTE — Progress Notes (Signed)
Cynthia Watkins Date of Birth: 10-29-1924   History of Present Illness: [name] is seen for post hospital follow up. She has a history of hypertension, hyperlipidemia, and a right bundle branch block. She was seen in June 2016 with complaints of palpitations and feeling faint.  Event monitor showed no arrhythmia. Echo noted below. All antihypertensive therapy was discontinued. She was seen again last year with more palpitations. Event monitor showed frequent PACs and one short run of NSVT 7 beats. She was managed with low dose beta blocker.  In January she was admitted to the hospital after a Gi illness with acute encephalopathy and sodium level down to 107. She was treated with hypertonic saline and lasix. Sodium improved to 129. She was DC on atenolol 12.5 mg daily and lasix 20 mg daily. She returned to the ED Jan 29 with symptoms of SOB and lightheadedness. Sodium improved to 133. BNP 150. CXR showed no acute change. It was felt that she had a panic attack and was hyperventilating. She was orthostatic and her lasix was discontinued. Repeat BMET scheduled for 02/07/16.   On follow up today she states she is doing well. Seen with her daughter today. No edema, dyspnea. Not sleeping well. No chest pain or palpitations.  Current Outpatient Prescriptions on File Prior to Visit  Medication Sig Dispense Refill  . atenolol (TENORMIN) 25 MG tablet Take 0.5 tablets (12.5 mg total) by mouth daily. 30 tablet 0  . Biotin 5000 MCG CAPS Take 1 capsule by mouth daily.     . calcium carbonate (CALCIUM 600) 600 MG TABS tablet Take 600 mg by mouth daily with breakfast.    . Cholecalciferol (VITAMIN D3) 1000 units CAPS Take 1,000 Units by mouth daily.    . Melatonin 3 MG CAPS Take 1 capsule (3 mg total) by mouth at bedtime as needed (sleep). 30 capsule 0  . polyethylene glycol (MIRALAX / GLYCOLAX) packet Take 17 g by mouth daily as needed for mild constipation. 14 each 0  . tamoxifen (NOLVADEX) 20 MG tablet Take  1 tablet (20 mg total) by mouth daily. 90 tablet PRN   No current facility-administered medications on file prior to visit.     No Known Allergies  Past Medical History:  Diagnosis Date  . Acute upper respiratory infections of unspecified site 06/09/2011  . Breast cancer ([place]) 12/11/2008  . Fatigue 01/2012  . Hypercholesterolemia    on lipitor  . Hypertension   . Hyponatremia 01/2016  . Mitral valve prolapse   . Other abnormal blood chemistry 01/21/2010  . Palpitations 08/05/2000  . Right bundle branch block   . Senile osteoporosis 09/28/2001    Past Surgical History:  Procedure Laterality Date  . BREAST LUMPECTOMY Right 12/27/2007  . LAPAROSCOPIC LYSIS OF ADHESIONS  07/15/2006   [name]  . OTHER SURGICAL HISTORY    . TONSILLECTOMY    . TUBAL LIGATION  1963    History  Smoking Status  . Former Smoker  . Quit date: 08/06/1954  Smokeless Tobacco  . Never Used    History  Alcohol Use  . Yes    Comment: Occasionally     Family History  Problem Relation Age of Onset  . Stroke Mother   . Cancer Father     colon  . Alzheimer's disease Sister     Review of Systems: As the history of present illness.  All other systems were reviewed and are negative.  Physical Exam: BP 104/62   Pulse  88   Ht 5\' 1"  (1.549 m)   Wt 98 lb (44.5 kg)   BMI 18.52 kg/m  She is a pleasant, elderly white female in no acute distress.The patient is alert and oriented x 3.    The skin is warm and dry.   The HEENT exam is normal. The carotids are 2+ without bruits.  There is no thyromegaly.  There is no JVD.  The lungs are clear.    The heart exam reveals a regular rate with a normal S1 and S2.  There are no murmurs, gallops, or rubs.  The PMI is not displaced.   Abdominal exam reveals good bowel sounds.There are no masses.  Exam of the legs reveal trace edema.  There are superficial varicosities.  The distal pulses are intact.  Cranial nerves II - XII are intact.  Motor and sensory functions are  intact.  The gait is normal.  LABORATORY DATA: Lab Results  Component Value Date   WBC 7.7 02/04/2016   HGB 11.5 (L) 02/04/2016   HCT 33.4 (L) 02/04/2016   PLT 303 02/04/2016   GLUCOSE 94 02/04/2016   CHOL 214 (H) 01/27/2016   TRIG 96 01/27/2016   HDL 62 01/27/2016   LDLCALC 133 (H) 01/27/2016   ALT 22 01/27/2016   AST 27 01/27/2016   NA 133 (L) 02/04/2016   K 4.9 02/04/2016   CL 102 02/04/2016   CREATININE 0.73 02/04/2016   BUN 20 02/04/2016   CO2 26 02/04/2016   TSH 1.505 01/23/2016   INR 1.07 01/23/2016   HGBA1C 5.9 (H) 01/23/2016   Echo 07/13/14:Study Conclusions  - Left ventricle: The cavity size was normal. Wall thickness was normal. Systolic function was normal. The estimated ejection fraction was in the range of 55% to 60%. Wall motion was normal; there were no regional wall motion abnormalities. Left ventricular diastolic function parameters were normal. - Aortic valve: There was mild regurgitation. - Mitral valve: There was mild to moderate regurgitation directed centrally. - Left atrium: The atrium was mildly dilated. - Atrial septum: No defect or patent foramen ovale was identified. - Tricuspid valve: Systolic bowing without prolapse. There was mild-moderate regurgitation directed centrally.  Assessment / Plan: 1. Dyspnea. No clear evidence of CHF today but she did have some edema recently and hyponatremia. Will check Echo. Agree with stopping lasix due to orthostasis. Continue low dose atenolol.   2. Hyponatremia. Probably related to GI illness and excessive po water intake. Now corrected.  3. Right bundle branch block, chronic.  4. History of HTN- now well controlled on minimal therapy  5. History of palpitations. Controlled on low dose atenolol.

## 2016-02-05 NOTE — Progress Notes (Signed)
Patient ID: Cynthia Watkins, female   DOB: 1924/09/05, 83 y.o.   MRN: [id number]  Provider:  Rexene Edison. Mariea Clonts, D.O., C.M.D. Location:  [place] Room Number: [name] of Service:  SNF (31)  PCP: [name], DO Patient Care Team: Gayland Curry, DO as PCP - General (Geriatric Medicine) Prentiss Bells, MD as Consulting Physician (Ophthalmology) Peter M Martinique, MD as Consulting Physician (Cardiology) Allyn Kenner, MD as Consulting Physician (Dermatology) Michael Boston, MD as Consulting Physician (General Surgery) [facility] Amada Kingfisher, MD (Inactive) as Consulting Physician (Hematology and Oncology) Chauncey Cruel, MD as Consulting Physician (Oncology) [name], MD as Consulting Physician (Ophthalmology)  Extended Emergency Contact Information Primary Emergency Contact: Gertha Calkin of [place] Phone: [phone] Work Phone: [phone] Mobile Phone: [phone] Relation: Son Secondary Emergency Contact: Joesphine Bare of [place] Phone: [phone] Mobile Phone: [phone] Relation: Son  Code Status: DNR Goals of Care: Advanced Directive information Advanced Directives 02/05/2016  Does Patient Have a Medical Advance Directive? Yes  Type of Advance Directive [place]  Does patient want to make changes to medical advance directive? No - Patient declined  Copy of [place] in Chart? Yes  Would patient like information on creating a medical advance directive? -   Chief Complaint  Patient presents with  . Transitions Of Care    01/23/2016 - 01/29/2016 (6 days)  . rehab admission    HPI: Patient is a 83 y.o. female with h/o breast cancer, hyperlipidemia, baseline hyponatremia in the lower 130s seen today for admission to [place] rehab (actually for the second time)/transitions of care.  She was admitted from 1/17-1/23 to the  hospital for aphasia, confusion, weakness and was found to have severe hyponatremia.  She had been suffering from a gastroenteritis in the previous days and had been drinking larger than usual volumes of pure water for hydration. During the hospitalization, she was orthostatic at times and holding atenolol resulted in tachycardia (used for rate control also).  She arrived late on 1/23 and was then seen by NP 1/26.  She was doing much better, had been walking the halls, and the orthostatics upon arrival had been normal.  Her orthostatics that day were positive for hypotension, but labs had improved with normalization of her Na to her baseline and stable creatinine with the lasix.  She was sent home with the lasix therapy at that point with plans to see me in f/u with a bmp before.    See more details if needed in NP note from 1/26.    On 1/29, she was sent back out to the hospital with shortness of breath, hyperventilating, POX 93% when fire staff arrived.  She felt dizzy and had nearly passed out (laid down instead).  Na in ED normal for her at 133.  H/H 11.5/33.4.  BNP only 150.  Urine unremarkable.  Normal troponin and unremarkable EKG.  She was also given melatonin to help her sleep at night.  Thought was that her symptoms were due to hyperventilation.  Cardiology f/u was suggested.  Echocardiogram is apparently going to be scheduled.   Has appt 02/06/16.     When seen today, she was doing very well outside of feeling more fatigued than her norm and anxious about her recent increased medical concerns.  Her daughter was present and we reviewed the events and plans.  I also contacted Dr. Irine Seal, her son, to update  him.    Past Medical History:  Diagnosis Date  . Acute upper respiratory infections of unspecified site 06/09/2011  . Breast cancer ([place]) 12/11/2008  . Fatigue 01/2012  . Hypercholesterolemia    on lipitor  . Hypertension   . Hyponatremia 01/2016  . Mitral valve prolapse   . Other abnormal  blood chemistry 01/21/2010  . Palpitations 08/05/2000  . Right bundle branch block   . Senile osteoporosis 09/28/2001   Past Surgical History:  Procedure Laterality Date  . BREAST LUMPECTOMY Right 12/27/2007  . LAPAROSCOPIC LYSIS OF ADHESIONS  07/15/2006   [name]  . OTHER SURGICAL HISTORY    . TONSILLECTOMY    . [place]    reports that she quit smoking about 61 years ago. She has never used smokeless tobacco. She reports that she drinks alcohol. She reports that she does not use drugs. Social History   Social History  . Marital status: Married    Spouse name: N/A  . Number of children: 3  . Years of education: N/A   Occupational History  . Retired Pharmacist, hospital Retired   Social History Main Topics  . Smoking status: Former Smoker    Quit date: 08/06/1954  . Smokeless tobacco: Never Used  . Alcohol use Yes     Comment: Occasionally   . Drug use: No  . Sexual activity: No   Other Topics Concern  . Not on file   Social History Narrative   Lives at [facility] since 05/1998   Widowed   Living will   Former smoker, stopped 1956   Alcohol  Rare   Exercise - walk one hour daily, 3 days of stretching and strengthen          Functional Status Survey:    Family History  Problem Relation Age of Onset  . Stroke Mother   . Cancer Father     colon  . Alzheimer's disease Sister     Health Maintenance  Topic Date Due  . Samul Dada  [birth year]/1945  . MAMMOGRAM  12/18/2015  . INFLUENZA VACCINE  Completed  . DEXA SCAN  Completed  . ZOSTAVAX  Completed  . PNA vac Low Risk Adult  Completed    No Known Allergies  Allergies as of 02/05/2016   No Known Allergies     Medication List       Accurate as of 02/05/16 10:14 AM. Always use your most recent med list.          atenolol 25 MG tablet Commonly known as:  TENORMIN Take 0.5 tablets (12.5 mg total) by mouth daily.   Biotin 5000 MCG Caps Take 1 capsule by mouth daily.   CALCIUM 600 600 MG Tabs  tablet Generic drug:  calcium carbonate Take 600 mg by mouth daily with breakfast.   furosemide 20 MG tablet Commonly known as:  LASIX Take 1 tablet (20 mg total) by mouth daily.   Melatonin 3 MG Caps Take 1 capsule (3 mg total) by mouth at bedtime as needed (sleep).   polyethylene glycol packet Commonly known as:  MIRALAX / GLYCOLAX Take 17 g by mouth daily as needed for mild constipation.   potassium chloride SA 20 MEQ tablet Commonly known as:  K-DUR,KLOR-CON Take 1 tablet (20 mEq total) by mouth daily.   tamoxifen 20 MG tablet Commonly known as:  NOLVADEX Take 1 tablet (20 mg total) by mouth daily.   Vitamin D3 1000 units Caps Take 1,000 Units by mouth daily.  Review of Systems  Constitutional: Positive for fatigue. Negative for activity change, appetite change, chills and fever.  HENT: Negative for congestion.   Eyes: Negative for visual disturbance.  Respiratory: Negative for chest tightness, shortness of breath and wheezing.   Cardiovascular: Negative for chest pain, palpitations and leg swelling.  Gastrointestinal: Negative for abdominal pain, blood in stool, constipation, diarrhea, nausea and vomiting.  Genitourinary: Negative for dysuria.  Musculoskeletal: Negative for arthralgias, back pain and gait problem.  Skin: Negative for color change.  Neurological: Positive for weakness. Negative for speech difficulty.       Resolved  Psychiatric/Behavioral: Negative for agitation, confusion and suicidal ideas.    Vitals:   02/05/16 1005  BP: 123/67  Pulse: 70  Resp: 20  Temp: 97.5 F (36.4 C)  TempSrc: Oral  SpO2: 97%   There is no height or weight on file to calculate BMI. Physical Exam  Constitutional: She is oriented to person, place, and time. She appears well-developed and well-nourished. No distress.  Thin white female  HENT:  Head: Normocephalic and atraumatic.  Right Ear: External ear normal.  Left Ear: External ear normal.  Eyes:  Conjunctivae and EOM are normal. Pupils are equal, round, and reactive to light.  glasses  Neck: Neck supple. No JVD present.  Cardiovascular: Normal rate, regular rhythm, normal heart sounds and intact distal pulses.   Pulmonary/Chest: Effort normal and breath sounds normal. No respiratory distress. She has no wheezes. She has no rales.  Abdominal: Soft. Bowel sounds are normal. She exhibits no distension and no mass. There is no tenderness. There is no rebound and no guarding.  Musculoskeletal: Normal range of motion.  Able to ambulate w/o assistive device  Lymphadenopathy:    She has no cervical adenopathy.  Neurological: She is alert and oriented to person, place, and time. No cranial nerve deficit.  Skin: Skin is warm and dry.  Psychiatric: She has a normal mood and affect. Her behavior is normal. Judgment and thought content normal.  Slightly anxious appearing    Labs reviewed: Basic Metabolic Panel:  Recent Labs  01/23/16 0815 01/23/16 1025  01/24/16 0301  01/25/16 0543  01/27/16 0241  01/29/16 0605 02/01/16 02/04/16 1210  NA 106* 107*  < > 112*  < > 122*  < > 126*  < > 129* 134* 133*  K 3.5 3.6  < >  --   < >  --   < > 3.8  --  3.4* 4.5 4.9  CL 73* 75*  < >  --   < >  --   < > 91*  --  91*  --  102  CO2 20* 20*  < >  --   < >  --   < > 27  --  29  --  26  GLUCOSE 153* 149*  < >  --   < >  --   < > 90  --  93  --  94  BUN 7 6  < >  --   < >  --   < > 23*  --  25* 31* 20  CREATININE 0.60 0.60  < >  --   < >  --   < > 0.91  --  0.88 0.7 0.73  CALCIUM 7.6* 7.5*  < >  --   < >  --   < > 8.7*  --  8.7*  --  8.4*  MG 1.4*  --   --  2.1  --  2.4  --   --   --   --   --   --   PHOS  --  2.2*  --  2.0*  --  2.1*  --   --   --   --   --   --   < > = values in this interval not displayed. Liver Function Tests:  Recent Labs  09/24/15 1514 01/23/16 0558 01/27/16 0241  AST 24 31 27   ALT 17 18 22   ALKPHOS 48 39 43  BILITOT 0.37 1.2 0.7  PROT 6.2* 6.0* 5.5*  ALBUMIN 3.5 3.7  3.2*    Recent Labs  01/23/16 0558  LIPASE 21   No results for input(s): AMMONIA in the last 8760 hours. CBC:  Recent Labs  09/24/15 1514 01/23/16 0558  01/24/16 0301 01/25/16 0543 02/04/16 1210  WBC 6.5 3.4*  < > 8.2 5.9 7.7  NEUTROABS 3.9 2.6  --   --   --  4.8  HGB 12.7 13.2  < > 13.8 14.7 11.5*  HCT 38.0 35.2*  < > 36.4 38.9 33.4*  MCV 99.5 88.4  < > 88.6 88.6 96.0  PLT 232 149*  < > 219 227 303  < > = values in this interval not displayed. Cardiac Enzymes:  Recent Labs  02/04/16 1210  TROPONINI <0.03   BNP: Invalid input(s): POCBNP Lab Results  Component Value Date   HGBA1C 5.9 (H) 01/23/2016   Lab Results  Component Value Date   TSH 1.505 01/23/2016   No results found for: VITAMINB12 No results found for: FOLATE No results found for: IRON, TIBC, FERRITIN  Imaging and Procedures obtained prior to SNF admission: Dg Chest 2 View  Result Date: 02/04/2016 CLINICAL DATA:  Shortness of breath. History of CHF, hypertension, breast malignancy, remote history of smoking. Previous granulomatous infection. EXAM: CHEST  2 VIEW COMPARISON:  Chest x-ray of January 16, 2012 and CT scan of the chest of January 25 2016. FINDINGS: The lungs are mildly hyperinflated. There is no focal infiltrate. There is no significant pleural effusion. The heart and pulmonary vascularity are normal. The mediastinum is normal in width. There is calcification in the wall of the aortic arch. The bony thorax exhibits no acute abnormality. IMPRESSION: COPD.  No acute pneumonia nor CHF. Thoracic aortic atherosclerosis. Electronically Signed   By: David  Martinique M.D.   On: 02/04/2016 13:15    Assessment/Plan 1. Aphasia -resolved with correction of sodium to baseline  2. Hyponatremia -back to baseline -cont close monitoring -D/c lasix and potassium due to worsening orthostatic hypotension since arrival here and ongoing diuresis plus more fatigue Maintain 1500cc fluid restriction Daily weights  for one week BMP 02/07/16 Has echo to determine if she has any CHF (BNP was unremarkable)  3. Orthostatic hypotension Orthostatic vitals today (positive) and again 1/31 off lasix  4. Multiple pulmonary nodules For outpatient f/u  5. Other fatigue Due to recent viral illness followed by severe hyponatremia and adjusting to needing some assistance to remain healthy; also may be made worse by the lasix if she's getting dry  6. Adjustment disorder with anxious mood Referral to [facility] counselor Butch Penny) due to adjustment concerns  Also encouraged her son and daughter in law to stay in town for the next few weeks to help pt with fluid restriction, temporarily [place] respite stay move and support her at this time  Family/ staff Communication: discussed with pt's son and daughter as well as the rehab nurse   Labs/tests ordered:  BMP

## 2016-02-05 NOTE — Telephone Encounter (Signed)
Called patient and left voicemail to call back and schedule her echocardiogram.  Left my name and number.

## 2016-02-05 NOTE — Telephone Encounter (Signed)
I spoke with him this am and met with his sister in pt's room.  Concerns and questions were addressed.

## 2016-02-06 ENCOUNTER — Telehealth: Payer: Self-pay | Admitting: Cardiology

## 2016-02-06 ENCOUNTER — Encounter: Payer: Self-pay | Admitting: Cardiology

## 2016-02-06 ENCOUNTER — Ambulatory Visit (INDEPENDENT_AMBULATORY_CARE_PROVIDER_SITE_OTHER): Payer: Medicare Other | Admitting: Cardiology

## 2016-02-06 VITALS — BP 104/62 | HR 88 | Ht 61.0 in | Wt 98.0 lb

## 2016-02-06 DIAGNOSIS — R42 Dizziness and giddiness: Secondary | ICD-10-CM

## 2016-02-06 DIAGNOSIS — R0602 Shortness of breath: Secondary | ICD-10-CM

## 2016-02-06 DIAGNOSIS — I491 Atrial premature depolarization: Secondary | ICD-10-CM

## 2016-02-06 DIAGNOSIS — I10 Essential (primary) hypertension: Secondary | ICD-10-CM | POA: Diagnosis not present

## 2016-02-06 DIAGNOSIS — I4510 Unspecified right bundle-branch block: Secondary | ICD-10-CM

## 2016-02-06 DIAGNOSIS — E871 Hypo-osmolality and hyponatremia: Secondary | ICD-10-CM

## 2016-02-06 NOTE — Telephone Encounter (Signed)
Called and left a voicemail to call me back and schedule echo.

## 2016-02-07 DIAGNOSIS — E871 Hypo-osmolality and hyponatremia: Secondary | ICD-10-CM | POA: Diagnosis not present

## 2016-02-07 LAB — BASIC METABOLIC PANEL
BUN: 23 mg/dL — AB (ref 4–21)
Creatinine: 0.8 mg/dL (ref 0.5–1.1)
Glucose: 136 mg/dL
Potassium: 4.5 mmol/L (ref 3.4–5.3)
Sodium: 136 mmol/L — AB (ref 137–147)

## 2016-02-08 ENCOUNTER — Ambulatory Visit: Payer: Medicare Other | Admitting: Internal Medicine

## 2016-02-11 ENCOUNTER — Telehealth: Payer: Self-pay | Admitting: *Deleted

## 2016-02-11 NOTE — Telephone Encounter (Signed)
Dr. Mariea Clonts completed [place] from [facility] [phone] Fax: [phone] for [name] and Ifra Urive due to patient's illness. Copy made and sent for scanning. Given to Geri to send forms out.

## 2016-02-20 ENCOUNTER — Non-Acute Institutional Stay: Payer: Medicare Other | Admitting: Internal Medicine

## 2016-02-20 ENCOUNTER — Encounter: Payer: Self-pay | Admitting: Internal Medicine

## 2016-02-20 VITALS — BP 110/62 | HR 67 | Temp 98.0°F | Wt 100.0 lb

## 2016-02-20 DIAGNOSIS — I951 Orthostatic hypotension: Secondary | ICD-10-CM | POA: Diagnosis not present

## 2016-02-20 DIAGNOSIS — F4322 Adjustment disorder with anxiety: Secondary | ICD-10-CM

## 2016-02-20 DIAGNOSIS — E871 Hypo-osmolality and hyponatremia: Secondary | ICD-10-CM | POA: Diagnosis not present

## 2016-02-20 NOTE — Progress Notes (Signed)
Location:  [facility] of Service:  Clinic (12)  Provider: Kem Parcher L. Mariea Clonts, D.O., C.M.D.  Code Status: DNR Goals of Care:  Advanced Directives 02/20/2016  Does Patient Have a Medical Advance Directive? Yes  Type of Advance Directive Out of facility DNR (pink MOST or yellow form);Healthcare Power of Attorney  Does patient want to make changes to medical advance directive? -  Copy of [place] in Chart? -  Would patient like information on creating a medical advance directive? -  Pre-existing out of facility DNR order (yellow form or pink MOST form) Yellow form placed in chart (order not valid for inpatient use)     Chief Complaint  Patient presents with  . Medical Management of Chronic Issues    68mth follow-up, discharge from rehab    HPI: Patient is a 83 y.o. female seen today for medical management of chronic diseases.   Patient is doing well after having hospitalization for hyponatremia. Is feeling better, and is hoping to leave AL and go back to independent living. Is eating well, gaining weight (down to 96 lbs., up to 102 lbs.). Aphasia has subsided with correction of Na.   Had ED visit 1/29, was attributed to panic attack. Has not had anymore episodes of such. Does report fear of the same hyponatremia episode happening again. Wants to have a plan in place to keep a check on Na. She thinks the episode of hyponatremia was due to flu, diarrhea, and was drinking lots of water.   There are plans in the works for her to go back to her independent home. No falls, as of recent. Feels she is ready to go home. Has hired a caretaker to come 2 hours/day for the first week to help her adjust. Her son is local and is involved in her care (daughter in [place] and son in [place]).   She had a her Na level drawn at 5 am this morning. Still pending.  Past Medical History:  Diagnosis Date  . Acute upper respiratory infections of  unspecified site 06/09/2011  . Breast cancer ([place]) 12/11/2008  . Fatigue 01/2012  . Hypercholesterolemia    on lipitor  . Hypertension   . Hyponatremia 01/2016  . Mitral valve prolapse   . Other abnormal blood chemistry 01/21/2010  . Palpitations 08/05/2000  . Right bundle branch block   . Senile osteoporosis 09/28/2001    Past Surgical History:  Procedure Laterality Date  . BREAST LUMPECTOMY Right 12/27/2007  . LAPAROSCOPIC LYSIS OF ADHESIONS  07/15/2006   [name]  . OTHER SURGICAL HISTORY    . TONSILLECTOMY    . TUBAL LIGATION  1963    No Known Allergies  Allergies as of 02/20/2016   No Known Allergies     Medication List       Accurate as of 02/20/16  2:12 PM. Always use your most recent med list.          atenolol 25 MG tablet Commonly known as:  TENORMIN Take 0.5 tablets (12.5 mg total) by mouth daily.   Biotin 5000 MCG Caps Take 1 capsule by mouth daily.   CALCIUM 600 600 MG Tabs tablet Generic drug:  calcium carbonate Take 600 mg by mouth daily with breakfast.   diclofenac sodium 1 % Gel Commonly known as:  VOLTAREN Apply 2 g topically 4 (four) times daily. To right great toe   Melatonin 3 MG Caps Take 1 capsule (3 mg total) by  mouth at bedtime as needed (sleep).   polyethylene glycol packet Commonly known as:  MIRALAX / GLYCOLAX Take 17 g by mouth daily as needed for mild constipation.   tamoxifen 20 MG tablet Commonly known as:  NOLVADEX Take 1 tablet (20 mg total) by mouth daily.   Vitamin D3 1000 units Caps Take 1,000 Units by mouth daily.       Review of Systems:  Review of Systems  Constitutional: Negative for activity change and appetite change.       Gained weight back already  HENT: Negative for congestion, facial swelling and sinus pressure.   Eyes: Negative for visual disturbance.  Respiratory: Negative for apnea, chest tightness and shortness of breath.   Cardiovascular: Negative for chest pain, palpitations and leg swelling.    Gastrointestinal: Negative for abdominal distention.  Genitourinary: Negative for dysuria, frequency and urgency.  Musculoskeletal: Negative for arthralgias and gait problem.  Skin: Negative for color change and pallor.  Neurological: Negative for dizziness and speech difficulty.  Hematological: Negative for adenopathy.  Psychiatric/Behavioral: Negative for agitation and behavioral problems.    Health Maintenance  Topic Date Due  . TETANUS/TDAP  05/16/1943  . MAMMOGRAM  12/18/2015  . INFLUENZA VACCINE  Completed  . DEXA SCAN  Completed  . ZOSTAVAX  Completed  . PNA vac Low Risk Adult  Completed    Physical Exam: Vitals:   02/20/16 1407  BP: 110/62  Pulse: 67  Temp: 98 F (36.7 C)  TempSrc: Oral  SpO2: 98%  Weight: 100 lb (45.4 kg)   Body mass index is 18.89 kg/m. Physical Exam  Constitutional: She is oriented to person, place, and time. She appears well-developed. No distress.  Thin white female  Cardiovascular: Normal rate, regular rhythm, normal heart sounds and intact distal pulses.   Pulmonary/Chest: Effort normal and breath sounds normal. No respiratory distress.  Abdominal: Bowel sounds are normal.  Musculoskeletal: Normal range of motion. She exhibits no tenderness.  Stooped posture, walks w/o assistive device  Neurological: She is alert and oriented to person, place, and time.  Skin: Skin is warm and dry.  Psychiatric: She has a normal mood and affect.    Labs reviewed: Basic Metabolic Panel:  Recent Labs  09/05/15 1456  01/23/16 0815 01/23/16 1025  01/24/16 0301  01/25/16 0543  01/27/16 0241  01/29/16 0605 02/01/16 02/04/16 1210 02/07/16 0300  NA  --   < > 106* 107*  < > 112*  < > 122*  < > 126*  < > 129* 134* 133* 136*  K  --   < > 3.5 3.6  < >  --   < >  --   < > 3.8  --  3.4* 4.5 4.9 4.5  CL  --   < > 73* 75*  < >  --   < >  --   < > 91*  --  91*  --  102  --   CO2  --   < > 20* 20*  < >  --   < >  --   < > 27  --  29  --  26  --   GLUCOSE   --   < > 153* 149*  < >  --   < >  --   < > 90  --  93  --  94  --   BUN  --   < > 7 6  < >  --   < >  --   < >  23*  --  25* 31* 20 23*  CREATININE  --   < > 0.60 0.60  < >  --   < >  --   < > 0.91  --  0.88 0.7 0.73 0.8  CALCIUM  --   < > 7.6* 7.5*  < >  --   < >  --   < > 8.7*  --  8.7*  --  8.4*  --   MG  --   --  1.4*  --   --  2.1  --  2.4  --   --   --   --   --   --   --   PHOS  --   --   --  2.2*  --  2.0*  --  2.1*  --   --   --   --   --   --   --   TSH 1.99  --  1.505  --   --   --   --   --   --   --   --   --   --   --   --   < > = values in this interval not displayed. Liver Function Tests:  Recent Labs  09/24/15 1514 01/23/16 0558 01/27/16 0241  AST 24 31 27   ALT 17 18 22   ALKPHOS 48 39 43  BILITOT 0.37 1.2 0.7  PROT 6.2* 6.0* 5.5*  ALBUMIN 3.5 3.7 3.2*    Recent Labs  01/23/16 0558  LIPASE 21   No results for input(s): AMMONIA in the last 8760 hours. CBC:  Recent Labs  09/24/15 1514 01/23/16 0558  01/24/16 0301 01/25/16 0543 02/04/16 1210  WBC 6.5 3.4*  < > 8.2 5.9 7.7  NEUTROABS 3.9 2.6  --   --   --  4.8  HGB 12.7 13.2  < > 13.8 14.7 11.5*  HCT 38.0 35.2*  < > 36.4 38.9 33.4*  MCV 99.5 88.4  < > 88.6 88.6 96.0  PLT 232 149*  < > 219 227 303  < > = values in this interval not displayed. Lipid Panel:  Recent Labs  08/09/15 0800 01/27/16 0241  CHOL 185 214*  HDL 76* 62  LDLCALC 94 133*  TRIG 74 96  CHOLHDL  --  3.5   Lab Results  Component Value Date   HGBA1C 5.9 (H) 01/23/2016    Procedures since last visit: Ct Angio Head W Or Wo Contrast  Result Date: 01/23/2016 CLINICAL DATA:  83 year old female with global aphasia. Initial encounter. EXAM: CT ANGIOGRAPHY HEAD AND NECK CT HEAD PERFUSION TECHNIQUE: Multiphase CT imaging of the brain was performed following IV bolus contrast injection. Subsequent parametric perfusion maps were calculated using RAPID software. Multidetector CT imaging of the head and neck was performed using the standard  protocol during bolus administration of intravenous contrast. Multiplanar CT image reconstructions and MIPs were obtained to evaluate the vascular anatomy. Carotid stenosis measurements (when applicable) are obtained utilizing NASCET criteria, using the distal internal carotid diameter as the denominator. CONTRAST:  100 mL Isovue 370 COMPARISON:  None. FINDINGS: CT HEAD FINDINGS Brain: No midline shift, ventriculomegaly, mass effect, evidence of mass lesion, intracranial hemorrhage or evidence of cortically based acute infarction. Gray-white matter differentiation is within normal limits throughout the brain. No cortical encephalomalacia identified. ASPECTS 10. Vascular: See below. Skull: No acute osseous abnormality identified. Sinuses: Clear. Orbits: Postoperative changes to both globes. No acute orbit or scalp soft  tissue finding. CT BRAIN PERFUSION FINDINGS: CBF (<30%) Volume:  0 mL Perfusion (Tmax>6.0s) volume: 8 mL (in the left cerebellar hemisphere, likely artifact). 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 No acute osseous abnormality identified. Upper chest: Negative lung apices ; mild apical scarring and dependent atelectasis. Other neck: Negative thyroid. Larynx, pharynx, parapharyngeal spaces, retropharyngeal space, sublingual space, submandibular glands and parotid glands are within normal limits. No lymphadenopathy. Aortic arch: 3 vessel arch configuration. Minimal arch atherosclerosis. No great vessel origin stenosis. Right carotid system: Negative except for tortuosity and minimal right carotid bifurcation atherosclerosis. Left carotid system: Negative except for tortuosity in minimal left carotid bifurcation atherosclerosis. Vertebral arteries:No proximal subclavian artery stenosis. Normal right vertebral artery origin. Tortuous right V1 segment which is in proximity to thyrocervical branches but as  expected does not appear to give off any branches. Tortuous right V2 segment. Tortuous right V3 segment. No vertebral artery stenosis to the skullbase. Normal left vertebral artery origin. Tortuous left V1 segment. Tortuous left V2 and V3 segments. No left vertebral artery stenosis to the skullbase. CTA HEAD Posterior circulation: Both vertebral artery V4 segments and the basilar are diminutive. Both PICA origins appear patent. Mild fenestration of the vertebrobasilar junction. Diminutive basilar artery without stenosis. Fetal type bilateral PCA origins. SCA origins are patent. The right PCA branches are normal. There is mild irregularity of the left PCA P2 and P3 segments with preserved distal enhancement. Anterior circulation: Both ICA siphons are patent. Both cavernous segments are tortuous. Mild bilateral siphon calcified plaque without stenosis. Ophthalmic and posterior communicating artery origins are normal. Normal carotid termini, MCA and ACA origin. Anterior communicating artery and bilateral ACA branches are within normal limits. There is a small median artery of the corpus callosum. Right MCA M1 segment, trifurcation, and right MCA branches are within normal limits. Left MCA M1 segment is normal except for an infundibulum of the lenticulostriate origin (series 605, image 21). The left MCA bifurcation is patent. Left MCA branches appear within normal limits. No left MCA branch occlusion is identified. Venous sinuses: Insufficient venous contrast on these images. Anatomic variants: Fetal type bilateral PCA origins with diminutive distal vertebral arteries and basilar. Review of the MIP images confirms the above findings IMPRESSION: 1.  Normal noncontrast CT appearance of the brain.  ASPECTS 10. 2. Negative for emergent large vessel occlusion. CT Perfusion data analysis is negative for infarct core or convincing penumbra. 3. The above was discussed by telephone with Dr. Roland Rack on 01/23/2016 at  0648 hours. 4. Tortuous cervical carotid and vertebral arteries with minimal atherosclerosis and no stenosis. 5. Tortuous ICA siphons with minimal atherosclerosis and no stenosis. Otherwise negative anterior circulation; small left MCA M1 infundibulum of the left lenticulostriate artery origin (normal variant). 6. Diminutive vertebrobasilar system on the basis of fetal type PCA origins. Mild atherosclerosis and stenosis of the left PCA (P2 and P3 segments). Otherwise negative posterior circulation. Electronically Signed   By: Genevie Ann M.D.   On: 01/23/2016 07:14   Dg Chest 2 View  Result Date: 02/04/2016 CLINICAL DATA:  Shortness of breath. History of CHF, hypertension, breast malignancy, remote history of smoking. Previous granulomatous infection. EXAM: CHEST  2 VIEW COMPARISON:  Chest x-ray of January 16, 2012 and CT scan of the chest of January 25 2016. FINDINGS: The lungs are mildly hyperinflated. There is no focal infiltrate. There is no significant pleural effusion. The heart and pulmonary vascularity are normal.  The mediastinum is normal in width. There is calcification in the wall of the aortic arch. The bony thorax exhibits no acute abnormality. IMPRESSION: COPD.  No acute pneumonia nor CHF. Thoracic aortic atherosclerosis. Electronically Signed   By: David  Martinique M.D.   On: 02/04/2016 13:15   Ct Head Wo Contrast  Result Date: 01/23/2016 CLINICAL DATA:  83 year old female with global aphasia. Initial encounter. EXAM: CT ANGIOGRAPHY HEAD AND NECK CT HEAD PERFUSION TECHNIQUE: Multiphase CT imaging of the brain was performed following IV bolus contrast injection. Subsequent parametric perfusion maps were calculated using RAPID software. Multidetector CT imaging of the head and neck was performed using the standard protocol during bolus administration of intravenous contrast. Multiplanar CT image reconstructions and MIPs were obtained to evaluate the vascular anatomy. Carotid stenosis measurements (when  applicable) are obtained utilizing NASCET criteria, using the distal internal carotid diameter as the denominator. CONTRAST:  100 mL Isovue 370 COMPARISON:  None. FINDINGS: CT HEAD FINDINGS Brain: No midline shift, ventriculomegaly, mass effect, evidence of mass lesion, intracranial hemorrhage or evidence of cortically based acute infarction. Gray-white matter differentiation is within normal limits throughout the brain. No cortical encephalomalacia identified. ASPECTS 10. Vascular: See below. Skull: No acute osseous abnormality identified. Sinuses: Clear. Orbits: Postoperative changes to both globes. No acute orbit or scalp soft tissue finding. CT BRAIN PERFUSION FINDINGS: CBF (<30%) Volume:  0 mL Perfusion (Tmax>6.0s) volume: 8 mL (in the left cerebellar hemisphere, likely artifact). 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 No acute osseous abnormality identified. Upper chest: Negative lung apices ; mild apical scarring and dependent atelectasis. Other neck: Negative thyroid. Larynx, pharynx, parapharyngeal spaces, retropharyngeal space, sublingual space, submandibular glands and parotid glands are within normal limits. No lymphadenopathy. Aortic arch: 3 vessel arch configuration. Minimal arch atherosclerosis. No great vessel origin stenosis. Right carotid system: Negative except for tortuosity and minimal right carotid bifurcation atherosclerosis. Left carotid system: Negative except for tortuosity in minimal left carotid bifurcation atherosclerosis. Vertebral arteries:No proximal subclavian artery stenosis. Normal right vertebral artery origin. Tortuous right V1 segment which is in proximity to thyrocervical branches but as expected does not appear to give off any branches. Tortuous right V2 segment. Tortuous right V3 segment. No vertebral artery stenosis to the skullbase. Normal left vertebral artery origin.  Tortuous left V1 segment. Tortuous left V2 and V3 segments. No left vertebral artery stenosis to the skullbase. CTA HEAD Posterior circulation: Both vertebral artery V4 segments and the basilar are diminutive. Both PICA origins appear patent. Mild fenestration of the vertebrobasilar junction. Diminutive basilar artery without stenosis. Fetal type bilateral PCA origins. SCA origins are patent. The right PCA branches are normal. There is mild irregularity of the left PCA P2 and P3 segments with preserved distal enhancement. Anterior circulation: Both ICA siphons are patent. Both cavernous segments are tortuous. Mild bilateral siphon calcified plaque without stenosis. Ophthalmic and posterior communicating artery origins are normal. Normal carotid termini, MCA and ACA origin. Anterior communicating artery and bilateral ACA branches are within normal limits. There is a small median artery of the corpus callosum. Right MCA M1 segment, trifurcation, and right MCA branches are within normal limits. Left MCA M1 segment is normal except for an infundibulum of the lenticulostriate origin (series 605, image 21). The left MCA bifurcation is patent. Left MCA branches appear within normal limits. No left MCA branch occlusion is identified. Venous sinuses: Insufficient venous contrast on these images. Anatomic variants: Fetal type bilateral  PCA origins with diminutive distal vertebral arteries and basilar. Review of the MIP images confirms the above findings IMPRESSION: 1.  Normal noncontrast CT appearance of the brain.  ASPECTS 10. 2. Negative for emergent large vessel occlusion. CT Perfusion data analysis is negative for infarct core or convincing penumbra. 3. The above was discussed by telephone with Dr. Roland Rack on 01/23/2016 at 0648 hours. 4. Tortuous cervical carotid and vertebral arteries with minimal atherosclerosis and no stenosis. 5. Tortuous ICA siphons with minimal atherosclerosis and no stenosis. Otherwise  negative anterior circulation; small left MCA M1 infundibulum of the left lenticulostriate artery origin (normal variant). 6. Diminutive vertebrobasilar system on the basis of fetal type PCA origins. Mild atherosclerosis and stenosis of the left PCA (P2 and P3 segments). Otherwise negative posterior circulation. Electronically Signed   By: Genevie Ann M.D.   On: 01/23/2016 07:14   Ct Angio Neck W Or Wo Contrast  Result Date: 01/23/2016 CLINICAL DATA:  83 year old female with global aphasia. Initial encounter. EXAM: CT ANGIOGRAPHY HEAD AND NECK CT HEAD PERFUSION TECHNIQUE: Multiphase CT imaging of the brain was performed following IV bolus contrast injection. Subsequent parametric perfusion maps were calculated using RAPID software. Multidetector CT imaging of the head and neck was performed using the standard protocol during bolus administration of intravenous contrast. Multiplanar CT image reconstructions and MIPs were obtained to evaluate the vascular anatomy. Carotid stenosis measurements (when applicable) are obtained utilizing NASCET criteria, using the distal internal carotid diameter as the denominator. CONTRAST:  100 mL Isovue 370 COMPARISON:  None. FINDINGS: CT HEAD FINDINGS Brain: No midline shift, ventriculomegaly, mass effect, evidence of mass lesion, intracranial hemorrhage or evidence of cortically based acute infarction. Gray-white matter differentiation is within normal limits throughout the brain. No cortical encephalomalacia identified. ASPECTS 10. Vascular: See below. Skull: No acute osseous abnormality identified. Sinuses: Clear. Orbits: Postoperative changes to both globes. No acute orbit or scalp soft tissue finding. CT BRAIN PERFUSION FINDINGS: CBF (<30%) Volume:  0 mL Perfusion (Tmax>6.0s) volume: 8 mL (in the left cerebellar hemisphere, likely artifact). 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  No acute osseous abnormality identified. Upper chest: Negative lung apices ; mild apical scarring and dependent atelectasis. Other neck: Negative thyroid. Larynx, pharynx, parapharyngeal spaces, retropharyngeal space, sublingual space, submandibular glands and parotid glands are within normal limits. No lymphadenopathy. Aortic arch: 3 vessel arch configuration. Minimal arch atherosclerosis. No great vessel origin stenosis. Right carotid system: Negative except for tortuosity and minimal right carotid bifurcation atherosclerosis. Left carotid system: Negative except for tortuosity in minimal left carotid bifurcation atherosclerosis. Vertebral arteries:No proximal subclavian artery stenosis. Normal right vertebral artery origin. Tortuous right V1 segment which is in proximity to thyrocervical branches but as expected does not appear to give off any branches. Tortuous right V2 segment. Tortuous right V3 segment. No vertebral artery stenosis to the skullbase. Normal left vertebral artery origin. Tortuous left V1 segment. Tortuous left V2 and V3 segments. No left vertebral artery stenosis to the skullbase. CTA HEAD Posterior circulation: Both vertebral artery V4 segments and the basilar are diminutive. Both PICA origins appear patent. Mild fenestration of the vertebrobasilar junction. Diminutive basilar artery without stenosis. Fetal type bilateral PCA origins. SCA origins are patent. The right PCA branches are normal. There is mild irregularity of the left PCA P2 and P3 segments with preserved distal enhancement. Anterior circulation: Both ICA siphons are patent. Both cavernous segments are tortuous. Mild bilateral siphon calcified  plaque without stenosis. Ophthalmic and posterior communicating artery origins are normal. Normal carotid termini, MCA and ACA origin. Anterior communicating artery and bilateral ACA branches are within normal limits. There is a small median artery of the corpus callosum. Right MCA M1  segment, trifurcation, and right MCA branches are within normal limits. Left MCA M1 segment is normal except for an infundibulum of the lenticulostriate origin (series 605, image 21). The left MCA bifurcation is patent. Left MCA branches appear within normal limits. No left MCA branch occlusion is identified. Venous sinuses: Insufficient venous contrast on these images. Anatomic variants: Fetal type bilateral PCA origins with diminutive distal vertebral arteries and basilar. Review of the MIP images confirms the above findings IMPRESSION: 1.  Normal noncontrast CT appearance of the brain.  ASPECTS 10. 2. Negative for emergent large vessel occlusion. CT Perfusion data analysis is negative for infarct core or convincing penumbra. 3. The above was discussed by telephone with Dr. Roland Rack on 01/23/2016 at 0648 hours. 4. Tortuous cervical carotid and vertebral arteries with minimal atherosclerosis and no stenosis. 5. Tortuous ICA siphons with minimal atherosclerosis and no stenosis. Otherwise negative anterior circulation; small left MCA M1 infundibulum of the left lenticulostriate artery origin (normal variant). 6. Diminutive vertebrobasilar system on the basis of fetal type PCA origins. Mild atherosclerosis and stenosis of the left PCA (P2 and P3 segments). Otherwise negative posterior circulation. Electronically Signed   By: Genevie Ann M.D.   On: 01/23/2016 07:14   Ct Chest Wo Contrast  Result Date: 01/25/2016 CLINICAL DATA:  83 year old female with history of hepatic encephalopathy and hyponatremia. History of breast cancer status post radiation therapy and chemotherapy. EXAM: CT CHEST WITHOUT CONTRAST TECHNIQUE: Multidetector CT imaging of the chest was performed following the standard protocol without IV contrast. COMPARISON:  No priors. FINDINGS: Cardiovascular: Heart size is normal. There is no significant pericardial fluid, thickening or pericardial calcification. There is aortic atherosclerosis, as  well as atherosclerosis of the great vessels of the mediastinum and the coronary arteries, including calcified atherosclerotic plaque in the left main, left anterior descending, left circumflex and right coronary arteries. Calcifications of the aortic valve. Mediastinum/Nodes: No pathologically enlarged mediastinal or hilar lymph nodes. Please note that accurate exclusion of hilar adenopathy is limited on noncontrast CT scans. Densely calcified right hilar lymph node incidentally noted. Esophagus is unremarkable in appearance. No axillary lymphadenopathy. Lungs/Pleura: A few scattered tiny calcified granulomas are noted. Several other 2-3 mm pulmonary nodules are seen scattered throughout the lungs bilaterally, highly nonspecific, but statistically likely to represent benign areas of mucoid impaction within terminal bronchioles. 5 mm subpleural nodule in the medial aspect of the superior segment of the left lower lobe (image 67 of series 7). No other larger more suspicious appearing pulmonary nodules or masses are noted. Mild scarring in the dependent portions of the lower lobes of the lungs bilaterally. No acute consolidative airspace disease. No pleural effusions. Upper Abdomen: Aortic atherosclerosis. Numerous calcified granulomas throughout the spleen. Musculoskeletal: There are no aggressive appearing lytic or blastic lesions noted in the visualized portions of the skeleton. IMPRESSION: 1. No acute findings in the thorax. 2. Multiple tiny pulmonary nodules measuring 5 mm or less in size, favored to be benign. However, given the patient's history breast cancer, noncontrast chest CT is recommended in 1 year to ensure the stability of these findings. This recommendation follows the consensus statement: Guidelines for Management of Incidental Pulmonary Nodules Detected on CT Images: From the Fleischner Society 2017; Radiology 2017; 284:228-243. 3. Aortic  atherosclerosis, in addition to left main and 3 vessel  coronary artery disease. Please note that although the presence of coronary artery calcium documents the presence of coronary artery disease, the severity of this disease and any potential stenosis cannot be assessed on this non-gated CT examination. Assessment for potential risk factor modification, dietary therapy or pharmacologic therapy may be warranted, if clinically indicated. 4. There are calcifications of the aortic valve. Echocardiographic correlation for evaluation of potential valvular dysfunction may be warranted if clinically indicated. 5. Old granulomatous disease, as above. Electronically Signed   By: Vinnie Langton M.D.   On: 01/25/2016 13:21   Ct Cerebral Perfusion W Contrast  Result Date: 01/23/2016 CLINICAL DATA:  83 year old female with global aphasia. Initial encounter. EXAM: CT ANGIOGRAPHY HEAD AND NECK CT HEAD PERFUSION TECHNIQUE: Multiphase CT imaging of the brain was performed following IV bolus contrast injection. Subsequent parametric perfusion maps were calculated using RAPID software. Multidetector CT imaging of the head and neck was performed using the standard protocol during bolus administration of intravenous contrast. Multiplanar CT image reconstructions and MIPs were obtained to evaluate the vascular anatomy. Carotid stenosis measurements (when applicable) are obtained utilizing NASCET criteria, using the distal internal carotid diameter as the denominator. CONTRAST:  100 mL Isovue 370 COMPARISON:  None. FINDINGS: CT HEAD FINDINGS Brain: No midline shift, ventriculomegaly, mass effect, evidence of mass lesion, intracranial hemorrhage or evidence of cortically based acute infarction. Gray-white matter differentiation is within normal limits throughout the brain. No cortical encephalomalacia identified. ASPECTS 10. Vascular: See below. Skull: No acute osseous abnormality identified. Sinuses: Clear. Orbits: Postoperative changes to both globes. No acute orbit or scalp soft  tissue finding. CT BRAIN PERFUSION FINDINGS: CBF (<30%) Volume:  0 mL Perfusion (Tmax>6.0s) volume: 8 mL (in the left cerebellar hemisphere, likely artifact). 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 No acute osseous abnormality identified. Upper chest: Negative lung apices ; mild apical scarring and dependent atelectasis. Other neck: Negative thyroid. Larynx, pharynx, parapharyngeal spaces, retropharyngeal space, sublingual space, submandibular glands and parotid glands are within normal limits. No lymphadenopathy. Aortic arch: 3 vessel arch configuration. Minimal arch atherosclerosis. No great vessel origin stenosis. Right carotid system: Negative except for tortuosity and minimal right carotid bifurcation atherosclerosis. Left carotid system: Negative except for tortuosity in minimal left carotid bifurcation atherosclerosis. Vertebral arteries:No proximal subclavian artery stenosis. Normal right vertebral artery origin. Tortuous right V1 segment which is in proximity to thyrocervical branches but as expected does not appear to give off any branches. Tortuous right V2 segment. Tortuous right V3 segment. No vertebral artery stenosis to the skullbase. Normal left vertebral artery origin. Tortuous left V1 segment. Tortuous left V2 and V3 segments. No left vertebral artery stenosis to the skullbase. CTA HEAD Posterior circulation: Both vertebral artery V4 segments and the basilar are diminutive. Both PICA origins appear patent. Mild fenestration of the vertebrobasilar junction. Diminutive basilar artery without stenosis. Fetal type bilateral PCA origins. SCA origins are patent. The right PCA branches are normal. There is mild irregularity of the left PCA P2 and P3 segments with preserved distal enhancement. Anterior circulation: Both ICA siphons are patent. Both cavernous segments are tortuous. Mild bilateral siphon  calcified plaque without stenosis. Ophthalmic and posterior communicating artery origins are normal. Normal carotid termini, MCA and ACA origin. Anterior communicating artery and bilateral ACA branches are within normal limits. There is a small median artery of the corpus callosum. Right MCA M1 segment, trifurcation,  and right MCA branches are within normal limits. Left MCA M1 segment is normal except for an infundibulum of the lenticulostriate origin (series 605, image 21). The left MCA bifurcation is patent. Left MCA branches appear within normal limits. No left MCA branch occlusion is identified. Venous sinuses: Insufficient venous contrast on these images. Anatomic variants: Fetal type bilateral PCA origins with diminutive distal vertebral arteries and basilar. Review of the MIP images confirms the above findings IMPRESSION: 1.  Normal noncontrast CT appearance of the brain.  ASPECTS 10. 2. Negative for emergent large vessel occlusion. CT Perfusion data analysis is negative for infarct core or convincing penumbra. 3. The above was discussed by telephone with Dr. Roland Rack on 01/23/2016 at 0648 hours. 4. Tortuous cervical carotid and vertebral arteries with minimal atherosclerosis and no stenosis. 5. Tortuous ICA siphons with minimal atherosclerosis and no stenosis. Otherwise negative anterior circulation; small left MCA M1 infundibulum of the left lenticulostriate artery origin (normal variant). 6. Diminutive vertebrobasilar system on the basis of fetal type PCA origins. Mild atherosclerosis and stenosis of the left PCA (P2 and P3 segments). Otherwise negative posterior circulation. Electronically Signed   By: Genevie Ann M.D.   On: 01/23/2016 07:14    Assessment/Plan 1. Hyponatremia -Na recheck today returned just after pt left clinic and was 137 today -cont 1500 cc fluid restriction -recheck Na in 1 month here in clinic -pt clear for [place] soon as caregivers arranged to come  in  2. Adjustment disorder with anxious mood -seems she is back to her normal self outside of worrying about the Na levels  3. Orthostatic hypotension -dizziness resolved after lasix stopped  -cont to monitor  Labs/tests ordered:   Orders Placed This Encounter  Procedures  . Basic metabolic panel    This external order was created through the Results Console.   Next appt:  05/21/2016 for med mgt f/u

## 2016-02-21 ENCOUNTER — Ambulatory Visit (HOSPITAL_COMMUNITY): Payer: Medicare Other | Attending: Cardiology

## 2016-02-21 DIAGNOSIS — I082 Rheumatic disorders of both aortic and tricuspid valves: Secondary | ICD-10-CM | POA: Insufficient documentation

## 2016-02-21 DIAGNOSIS — I4510 Unspecified right bundle-branch block: Secondary | ICD-10-CM | POA: Insufficient documentation

## 2016-02-21 DIAGNOSIS — I10 Essential (primary) hypertension: Secondary | ICD-10-CM | POA: Diagnosis not present

## 2016-02-21 DIAGNOSIS — R0602 Shortness of breath: Secondary | ICD-10-CM

## 2016-02-21 DIAGNOSIS — R002 Palpitations: Secondary | ICD-10-CM | POA: Insufficient documentation

## 2016-02-28 DIAGNOSIS — E871 Hypo-osmolality and hyponatremia: Secondary | ICD-10-CM | POA: Diagnosis not present

## 2016-02-28 DIAGNOSIS — R609 Edema, unspecified: Secondary | ICD-10-CM | POA: Diagnosis not present

## 2016-02-28 DIAGNOSIS — E222 Syndrome of inappropriate secretion of antidiuretic hormone: Secondary | ICD-10-CM | POA: Diagnosis not present

## 2016-02-28 DIAGNOSIS — I129 Hypertensive chronic kidney disease with stage 1 through stage 4 chronic kidney disease, or unspecified chronic kidney disease: Secondary | ICD-10-CM | POA: Diagnosis not present

## 2016-03-06 DIAGNOSIS — Z853 Personal history of malignant neoplasm of breast: Secondary | ICD-10-CM | POA: Diagnosis not present

## 2016-03-06 DIAGNOSIS — Z1231 Encounter for screening mammogram for malignant neoplasm of breast: Secondary | ICD-10-CM | POA: Diagnosis not present

## 2016-03-06 LAB — HM MAMMOGRAPHY

## 2016-03-18 DIAGNOSIS — E871 Hypo-osmolality and hyponatremia: Secondary | ICD-10-CM | POA: Diagnosis not present

## 2016-03-21 ENCOUNTER — Telehealth: Payer: Self-pay | Admitting: *Deleted

## 2016-03-21 NOTE — Telephone Encounter (Signed)
Spoke with patient and advised results, pt understood and didn't have any questions.

## 2016-03-21 NOTE — Telephone Encounter (Signed)
Patient called and stated that she had blood work done on Tuesday at [facility] and she is calling wanting her Sodium Result. She stated that when the sodium level dropped before it got her into a lot of trouble and she doesn't want it to happen again so she wants to keep up with it. Please Advise.

## 2016-03-27 ENCOUNTER — Telehealth: Payer: Self-pay

## 2016-03-27 NOTE — Telephone Encounter (Signed)
Cynthia Watkins with [facility]- screening for peripheral arterial disease was completed. Patient's results:  1.05 Normal -Left 0.47 Abnormal- Right Patient without pain, swelling or redness  Copy of report will be sent to our office within 3-4 days

## 2016-03-27 NOTE — Telephone Encounter (Signed)
Ok.  I will review when received.  These "screenings" have not been historically reliable.

## 2016-04-09 DIAGNOSIS — Z961 Presence of intraocular lens: Secondary | ICD-10-CM | POA: Diagnosis not present

## 2016-04-09 DIAGNOSIS — H52203 Unspecified astigmatism, bilateral: Secondary | ICD-10-CM | POA: Diagnosis not present

## 2016-04-21 ENCOUNTER — Telehealth: Payer: Self-pay | Admitting: *Deleted

## 2016-04-21 NOTE — Telephone Encounter (Signed)
Patient called requesting Labwork for her Sodium to be tested. Stated that since it has been low in the past she would like to keep check on it and has request labs to be drawn. Please Advise.

## 2016-04-21 NOTE — Telephone Encounter (Signed)
Next visit with me is 05/21/16.  She can have a bmp at [facility] the week before that appt so I have the results in time (if it's not already scheduled).

## 2016-04-25 NOTE — Telephone Encounter (Signed)
Pt's lab appt is may 8th at [facility]

## 2016-04-25 NOTE — Telephone Encounter (Signed)
Patient called to inquire about message left on 04/21/16.  I spoke with patient, patient aware of Dr.Reed's response and message sent to Dr.Reed's assistance to set up lab appointment 1 week prior. (patient will need a call to know when and what time labs will be)

## 2016-05-13 DIAGNOSIS — E871 Hypo-osmolality and hyponatremia: Secondary | ICD-10-CM | POA: Diagnosis not present

## 2016-05-13 DIAGNOSIS — D649 Anemia, unspecified: Secondary | ICD-10-CM | POA: Diagnosis not present

## 2016-05-13 LAB — BASIC METABOLIC PANEL
BUN: 15 mg/dL (ref 4–21)
Creatinine: 0.8 mg/dL (ref 0.5–1.1)
Glucose: 100 mg/dL
Potassium: 4.2 mmol/L (ref 3.4–5.3)
Sodium: 137 mmol/L (ref 137–147)

## 2016-05-14 ENCOUNTER — Encounter: Payer: Self-pay | Admitting: Internal Medicine

## 2016-05-21 ENCOUNTER — Encounter: Payer: Self-pay | Admitting: Internal Medicine

## 2016-05-21 ENCOUNTER — Non-Acute Institutional Stay: Payer: Medicare Other | Admitting: Internal Medicine

## 2016-05-21 VITALS — BP 120/72 | HR 61 | Temp 98.5°F | Ht 61.0 in | Wt 99.6 lb

## 2016-05-21 DIAGNOSIS — Z23 Encounter for immunization: Secondary | ICD-10-CM | POA: Diagnosis not present

## 2016-05-21 DIAGNOSIS — M810 Age-related osteoporosis without current pathological fracture: Secondary | ICD-10-CM

## 2016-05-21 DIAGNOSIS — E871 Hypo-osmolality and hyponatremia: Secondary | ICD-10-CM

## 2016-05-21 DIAGNOSIS — C50511 Malignant neoplasm of lower-outer quadrant of right female breast: Secondary | ICD-10-CM | POA: Diagnosis not present

## 2016-05-21 DIAGNOSIS — I10 Essential (primary) hypertension: Secondary | ICD-10-CM | POA: Diagnosis not present

## 2016-05-21 DIAGNOSIS — Z170 Estrogen receptor positive status [ER+]: Secondary | ICD-10-CM

## 2016-05-21 MED ORDER — TETANUS-DIPHTH-ACELL PERTUSSIS 5-2.5-18.5 LF-MCG/0.5 IM SUSP: 0.5000 mL | Freq: Once | INTRAMUSCULAR | 0 refills | Status: AC

## 2016-05-21 NOTE — Progress Notes (Signed)
Location:  [facility] clinic Provider:  Tenesia Escudero L. Mariea Clonts, D.O., C.M.D.  Code Status: DNR Goals of Care:  Advanced Directives 05/21/2016  Does Patient Have a Medical Advance Directive? Yes  Type of Advance Directive [place]  Does patient want to make changes to medical advance directive? -  Copy of [place] in Chart? Yes  Would patient like information on creating a medical advance directive? -  Pre-existing out of facility DNR order (yellow form or pink MOST form) -    Chief Complaint  Patient presents with  . Medical Management of Chronic Issues    3 month medication management    HPI: Patient is a 83 y.o. female seen today for medical management of chronic diseases.    Hyponatremia:  Na wnl at last check--137.  Discussed just not limiting salt, but not necessarily liberally using salt--she does have a touch more edema in her feet than usual today.    Does not sleep very well.  Gets up to urinate 2-3 times and then cannot go back to sleep.  Does not drink anything after dinner.  No difficulty with incontinence.  Just lies there if she cannot go back to sleep.  Might try taking the melatonin after she gets up to urinate to see if she can get back to sleep.   Still eating blueberries and walnuts in yogurt.  Walking for an hour 7 days a week.  Walks outside unless it's very hot so she can get some hills into her exercise.    Senile osteoporosis:  On calcium therapy.  Did not want osteoporosis medications despite her tamoxifen use.    Discussed need for tdap.  She agrees and Rx given to get done at her pharmacy.  Past Medical History:  Diagnosis Date  . Acute upper respiratory infections of unspecified site 06/09/2011  . Breast cancer ([place]) 12/11/2008  . Fatigue 01/2012  . Hypercholesterolemia    on lipitor  . Hypertension   . Hyponatremia 01/2016  . Mitral valve prolapse   . Other abnormal blood chemistry 01/21/2010  . Palpitations 08/05/2000  .  Right bundle branch block   . Senile osteoporosis 09/28/2001    Past Surgical History:  Procedure Laterality Date  . BREAST LUMPECTOMY Right 12/27/2007  . LAPAROSCOPIC LYSIS OF ADHESIONS  07/15/2006   [name]  . OTHER SURGICAL HISTORY    . TONSILLECTOMY    . TUBAL LIGATION  1963    No Known Allergies  Allergies as of 05/21/2016   No Known Allergies     Medication List       Accurate as of 05/21/16  3:49 PM. Always use your most recent med list.          atenolol 25 MG tablet Commonly known as:  TENORMIN Take 0.5 tablets (12.5 mg total) by mouth daily.   Biotin 5000 MCG Caps Take 1 capsule by mouth daily.   CALCIUM 600 600 MG Tabs tablet Generic drug:  calcium carbonate Take 600 mg by mouth daily with breakfast.   diclofenac sodium 1 % Gel Commonly known as:  VOLTAREN Apply 2 g topically 4 (four) times daily. To right great toe   Melatonin 3 MG Caps Take 1 capsule (3 mg total) by mouth at bedtime as needed (sleep).   polyethylene glycol packet Commonly known as:  MIRALAX / GLYCOLAX Take 17 g by mouth daily as needed for mild constipation.   tamoxifen 20 MG tablet Commonly known as:  NOLVADEX  Take 1 tablet (20 mg total) by mouth daily.   Vitamin D3 1000 units Caps Take 1,000 Units by mouth daily.       Review of Systems:  Review of Systems  Constitutional: Negative for chills and fever.  HENT: Negative for congestion.   Eyes: Negative for blurred vision.       Glasses  Respiratory: Negative for cough and shortness of breath.   Cardiovascular: Negative for chest pain, palpitations and leg swelling.  Gastrointestinal: Negative for abdominal pain, blood in stool, constipation and melena.  Genitourinary: Positive for frequency. Negative for dysuria.       Nocturia--drinks a lot of water  Musculoskeletal: Negative for falls.  Skin: Negative for rash.  Neurological: Negative for dizziness and loss of consciousness.  Endo/Heme/Allergies: Does not  bruise/bleed easily.  Psychiatric/Behavioral: Negative for depression and memory loss. The patient is nervous/anxious and has insomnia.     Health Maintenance  Topic Date Due  . TETANUS/TDAP  05/16/1943  . INFLUENZA VACCINE  08/06/2016  . MAMMOGRAM  03/06/2017  . DEXA SCAN  Completed  . PNA vac Low Risk Adult  Completed    Physical Exam: Vitals:   05/21/16 1515  BP: 120/72  Pulse: 61  Temp: 98.5 F (36.9 C)  TempSrc: Oral  SpO2: 96%  Weight: 99 lb 9.6 oz (45.2 kg)  Height: 5\' 1"  (1.549 m)   Body mass index is 18.82 kg/m. Physical Exam  Constitutional: She is oriented to person, place, and time. She appears well-developed and well-nourished. No distress.  Eyes:  glasses  Cardiovascular: Normal rate, regular rhythm, normal heart sounds and intact distal pulses.   Pulmonary/Chest: Effort normal and breath sounds normal. No respiratory distress.  Abdominal: Soft. Bowel sounds are normal.  Musculoskeletal: Normal range of motion.  Neurological: She is alert and oriented to person, place, and time.  Skin: Skin is warm and dry. Capillary refill takes less than 2 seconds.  Psychiatric: She has a normal mood and affect.    Labs reviewed: Basic Metabolic Panel:  Recent Labs  09/05/15 1456  01/23/16 0815 01/23/16 1025  01/24/16 0301  01/25/16 0543  01/27/16 0241  01/29/16 0605  02/04/16 1210 02/07/16 0300 05/13/16 0300  NA  --   < > 106* 107*  < > 112*  < > 122*  < > 126*  < > 129*  < > 133* 136* 137  K  --   < > 3.5 3.6  < >  --   < >  --   < > 3.8  --  3.4*  < > 4.9 4.5 4.2  CL  --   < > 73* 75*  < >  --   < >  --   < > 91*  --  91*  --  102  --   --   CO2  --   < > 20* 20*  < >  --   < >  --   < > 27  --  29  --  26  --   --   GLUCOSE  --   < > 153* 149*  < >  --   < >  --   < > 90  --  93  --  94  --   --   BUN  --   < > 7 6  < >  --   < >  --   < > 23*  --  25*  < > 20  23* 15  CREATININE  --   < > 0.60 0.60  < >  --   < >  --   < > 0.91  --  0.88  < > 0.73 0.8  0.8  CALCIUM  --   < > 7.6* 7.5*  < >  --   < >  --   < > 8.7*  --  8.7*  --  8.4*  --   --   MG  --   --  1.4*  --   --  2.1  --  2.4  --   --   --   --   --   --   --   --   PHOS  --   --   --  2.2*  --  2.0*  --  2.1*  --   --   --   --   --   --   --   --   TSH 1.99  --  1.505  --   --   --   --   --   --   --   --   --   --   --   --   --   < > = values in this interval not displayed. Liver Function Tests:  Recent Labs  09/24/15 1514 01/23/16 0558 01/27/16 0241  AST 24 31 27   ALT 17 18 22   ALKPHOS 48 39 43  BILITOT 0.37 1.2 0.7  PROT 6.2* 6.0* 5.5*  ALBUMIN 3.5 3.7 3.2*    Recent Labs  01/23/16 0558  LIPASE 21   No results for input(s): AMMONIA in the last 8760 hours. CBC:  Recent Labs  09/24/15 1514 01/23/16 0558  01/24/16 0301 01/25/16 0543 02/04/16 1210  WBC 6.5 3.4*  < > 8.2 5.9 7.7  NEUTROABS 3.9 2.6  --   --   --  4.8  HGB 12.7 13.2  < > 13.8 14.7 11.5*  HCT 38.0 35.2*  < > 36.4 38.9 33.4*  MCV 99.5 88.4  < > 88.6 88.6 96.0  PLT 232 149*  < > 219 227 303  < > = values in this interval not displayed. Lipid Panel:  Recent Labs  08/09/15 0800 01/27/16 0241  CHOL 185 214*  HDL 76* 62  LDLCALC 94 133*  TRIG 74 96  CHOLHDL  --  3.5   Lab Results  Component Value Date   HGBA1C 5.9 (H) 01/23/2016   Assessment/Plan 1. Essential hypertension, benign -bp well controlled, cont same regimen and monitor  2. Senile osteoporosis -continues to walk regularly, cont ca with D and additional D3, refuses other tx despite tamoxifen use which increases her risk of this declining even more  3. Malignant neoplasm of lower-outer quadrant of right breast of female, estrogen receptor positive ([place]) -continues on tamoxifen therapy and tolerates well  4. Hyponatremia -cont to monitor sodium, she likes to drink water and has to be careful not to overdo, also is to eat a more liberal diet in terms of sodium   5. Need for Tdap vaccination -Rx given to get booster at  pharmacy   Labs/tests ordered:  Tdap, labs before next visit Next appt:  3 mos med mgt, labs before  Ritvik Mczeal L. Manases Etchison, D.O. [facility] [address], [place] [address] Cell Phone (Mon-Fri 8am-5pm):  [phone] On Call:  [phone] & follow prompts after 5pm & weekends Office Phone:  [phone]  Office Fax:  [phone]

## 2016-06-09 ENCOUNTER — Other Ambulatory Visit: Payer: Self-pay | Admitting: Cardiology

## 2016-06-16 DIAGNOSIS — I129 Hypertensive chronic kidney disease with stage 1 through stage 4 chronic kidney disease, or unspecified chronic kidney disease: Secondary | ICD-10-CM | POA: Diagnosis not present

## 2016-06-16 DIAGNOSIS — E222 Syndrome of inappropriate secretion of antidiuretic hormone: Secondary | ICD-10-CM | POA: Diagnosis not present

## 2016-07-20 ENCOUNTER — Telehealth: Payer: Self-pay

## 2016-07-20 NOTE — Telephone Encounter (Signed)
Called and left a message with new appts due to dr gm out of office  Cynthia Watkins

## 2016-08-06 ENCOUNTER — Non-Acute Institutional Stay: Payer: Medicare Other

## 2016-08-06 VITALS — BP 122/70 | HR 67 | Temp 98.4°F | Ht 61.0 in | Wt 99.0 lb

## 2016-08-06 DIAGNOSIS — Z0000 Encounter for general adult medical examination without abnormal findings: Secondary | ICD-10-CM

## 2016-08-06 MED ORDER — ZOSTER VAC RECOMB ADJUVANTED 50 MCG/0.5ML IM SUSR: 0.5000 mL | Freq: Once | INTRAMUSCULAR | 1 refills | Status: AC

## 2016-08-06 NOTE — Progress Notes (Signed)
Subjective:   Cynthia Watkins is a 83 y.o. female who presents for Medicare Annual (Subsequent) preventive examination at [facility]-08/15/15       Objective:     Vitals: BP 122/70 (BP Location: Right Arm, Patient Position: Sitting)   Pulse 67   Temp 98.4 F (36.9 C) (Oral)   Ht 5\' 1"  (1.549 m)   Wt 99 lb (44.9 kg)   SpO2 96%   BMI 18.71 kg/m   Body mass index is 18.71 kg/m.   Tobacco History  Smoking Status  . Former Smoker  . Packs/day: 0.25  . Years: 10.00  . Quit date: 08/06/1954  Smokeless Tobacco  . Never Used     Counseling given: Not Answered   Past Medical History:  Diagnosis Date  . Acute upper respiratory infections of unspecified site 06/09/2011  . Breast cancer ([place]) 12/11/2008  . Fatigue 01/2012  . Hypercholesterolemia    on lipitor  . Hypertension   . Hyponatremia 01/2016  . Mitral valve prolapse   . Other abnormal blood chemistry 01/21/2010  . Palpitations 08/05/2000  . Right bundle branch block   . Senile osteoporosis 09/28/2001   Past Surgical History:  Procedure Laterality Date  . BREAST LUMPECTOMY Right 12/27/2007  . LAPAROSCOPIC LYSIS OF ADHESIONS  07/15/2006   [name]  . OTHER SURGICAL HISTORY    . TONSILLECTOMY    . TUBAL LIGATION  1963   Family History  Problem Relation Age of Onset  . Stroke Mother   . Cancer Father        colon  . Alzheimer's disease Sister    History  Sexual Activity  . Sexual activity: No    Outpatient Encounter Prescriptions as of 08/06/2016  Medication Sig  . atenolol (TENORMIN) 25 MG tablet TAKE (1/2) TABLET DAILY.  Marland Kitchen Biotin 5000 MCG CAPS Take 1 capsule by mouth daily.   . calcium carbonate (CALCIUM 600) 600 MG TABS tablet Take 600 mg by mouth daily with breakfast.  . Cholecalciferol (VITAMIN D3) 1000 units CAPS Take 1,000 Units by mouth daily.  . Melatonin 3 MG CAPS Take 1 capsule (3 mg total) by mouth at bedtime as needed (sleep).  . tamoxifen (NOLVADEX) 20 MG  tablet Take 1 tablet (20 mg total) by mouth daily.  Marland Kitchen Zoster Vac Recomb Adjuvanted [facility]) injection Inject 0.5 mLs into the muscle once.  . [DISCONTINUED] diclofenac sodium (VOLTAREN) 1 % GEL Apply 2 g topically 4 (four) times daily. To right great toe  . [DISCONTINUED] polyethylene glycol (MIRALAX / GLYCOLAX) packet Take 17 g by mouth daily as needed for mild constipation.   No facility-administered encounter medications on file as of 08/06/2016.     Activities of Daily Living In your present state of health, do you have any difficulty performing the following activities: 08/06/2016 01/28/2016  Hearing? Y [name]? N N  Comment - -  Difficulty concentrating or making decisions? N N  Walking or climbing stairs? N Y  Dressing or bathing? N N  Doing errands, shopping? [name]  Preparing Food and eating ? N -  Using the Toilet? N -  In the past six months, have you accidently leaked urine? N -  Comment - -  Do you have problems with loss of bowel control? N -  Managing your Medications? N -  Managing your Finances? N -  Housekeeping or managing your Housekeeping? N -  Some recent data might  be hidden    Patient Care Team: Gayland Curry, DO as PCP - General (Geriatric Medicine) Prentiss Bells, MD as Consulting Physician (Ophthalmology) [place], Peter M, MD as Consulting Physician (Cardiology) Allyn Kenner, MD as Consulting Physician (Dermatology) Michael Boston, MD as Consulting Physician (General Surgery) Community, Well Loreli Slot, MD (Inactive) as Consulting Physician (Hematology and Oncology) Magrinat, Virgie Dad, MD as Consulting Physician (Oncology) [name], MD as Consulting Physician (Ophthalmology)    Assessment:     Exercise Activities and Dietary recommendations Current Exercise Habits: Structured exercise class, Type of exercise: yoga;strength training/weights;walking, Time (Minutes): 60, Frequency (Times/Week): >7, Weekly Exercise  (Minutes/Week): 0, Intensity: Mild  Goals    . increase water          Patient will increase water and herbal tea intake      Fall Risk Fall Risk  08/06/2016 10/31/2015 08/15/2015 02/14/2015 02/14/2014  Falls in the past year? No No No No No   Depression Screen PHQ 2/9 Scores 08/06/2016 10/31/2015 08/15/2015 02/14/2014  PHQ - 2 Score 0 0 0 0     Cognitive Function MMSE - Mini Mental State Exam 08/06/2016 08/15/2015 02/14/2014  Orientation to time 5 4 5   Orientation to Place 5 5 5   Registration 3 3 3   Attention/ Calculation 5 5 5   Recall 3 3 3   Language- name 2 objects 2 2 2   Language- repeat 1 1 1   Language- follow 3 step command 3 3 3   Language- read & follow direction 1 1 1   Write a sentence 1 1 1   Copy design 1 1 1   Total score 30 29 30         Immunization History  Administered Date(s) Administered  . Influenza Split 10/23/2011  . Influenza, High Dose Seasonal PF 09/20/2015  . Influenza,inj,Quad PF,36+ Mos 10/28/2013  . Influenza-Unspecified 10/06/2012, 10/26/2014  . Pneumococcal Conjugate-13 02/14/2014  . Pneumococcal Polysaccharide-23 01/06/1997  . Zoster 01/07/1999   Screening Tests Health Maintenance  Topic Date Due  . TETANUS/TDAP  05/16/1943  . INFLUENZA VACCINE  08/06/2016  . MAMMOGRAM  03/06/2017  . DEXA SCAN  Completed  . PNA vac Low Risk Adult  Completed      Plan:    I have personally reviewed and addressed the Medicare Annual Wellness questionnaire and have noted the following in the patient's chart:  A. Medical and social history B. Use of alcohol, tobacco or illicit drugs  C. Current medications and supplements D. Functional ability and status E.  Nutritional status F.  Physical activity G. Advance directives H. List of other physicians I.  Hospitalizations, surgeries, and ER visits in previous 12 months J.  [name] to include hearing, vision, cognitive, depression L. Referrals and appointments - none  In addition, I have reviewed and  discussed with patient certain preventive protocols, quality metrics, and best practice recommendations. A written personalized care plan for preventive services as well as general preventive health recommendations were provided to patient.  See attached scanned questionnaire for additional information.   Signed,   Rich Reining, RN Nurse Health Advisor   Quick Notes   Health Maintenance: Shingles prescription sent to pharmacy     Abnormal Screen: MMSE 30/30. Passed clock drawing     Patient Concerns: Need to discuss Tamoxifen medication with patient     Nurse Concerns: none

## 2016-08-06 NOTE — Patient Instructions (Signed)
Cynthia Watkins , Thank you for taking time to come for your Medicare Wellness Visit. I appreciate your ongoing commitment to your health goals. Please review the following plan we discussed and let me know if I can assist you in the future.   Screening recommendations/referrals: Colonoscopy excluded, pt over age 81 Mammogram excluded, pt over age 46 Bone Density up to date Recommended yearly ophthalmology/optometry visit for glaucoma screening and checkup Recommended yearly dental visit for hygiene and checkup  Vaccinations: Influenza vaccine due 2018 fall season Pneumococcal vaccine up to date Tdap vaccine due, declined. Let us know if you change your mind Shingles vaccine- I have sent prescription to your pharmacy for the new Shingles vaccine  Advanced directives: In Chart  Conditions/risks identified: None  Next appointment: Labs drawn 08/26/16 @8am     Dr. Mariea Clonts 08/27/16 @3pm    Preventive Care 65 Years and Older, Female Preventive care refers to lifestyle choices and visits with your health care provider that can promote health and wellness. What does preventive care include?  A yearly physical exam. This is also called an annual well check.  Dental exams once or twice a year.  Routine eye exams. Ask your health care provider how often you should have your eyes checked.  Personal lifestyle choices, including:  Daily care of your teeth and gums.  Regular physical activity.  Eating a healthy diet.  Avoiding tobacco and drug use.  Limiting alcohol use.  Practicing safe sex.  Taking low-dose aspirin every day.  Taking vitamin and mineral supplements as recommended by your health care provider. What happens during an annual well check? The services and screenings done by your health care provider during your annual well check will depend on your age, overall health, lifestyle risk factors, and family history of disease. Counseling  Your health care provider may ask you  questions about your:  Alcohol use.  Tobacco use.  Drug use.  Emotional well-being.  Home and relationship well-being.  Sexual activity.  Eating habits.  History of falls.  Memory and ability to understand (cognition).  Work and work Statistician.  Reproductive health. Screening  You may have the following tests or measurements:  Height, weight, and BMI.  Blood pressure.  Lipid and cholesterol levels. These may be checked every 5 years, or more frequently if you are over 1 years old.  Skin check.  Lung cancer screening. You may have this screening every year starting at age 105 if you have a 30-pack-year history of smoking and currently smoke or have quit within the past 15 years.  Fecal occult blood test (FOBT) of the stool. You may have this test every year starting at age 65.  Flexible sigmoidoscopy or colonoscopy. You may have a sigmoidoscopy every 5 years or a colonoscopy every 10 years starting at age 15.  Hepatitis C blood test.  Hepatitis B blood test.  Sexually transmitted disease (STD) testing.  Diabetes screening. This is done by checking your blood sugar (glucose) after you have not eaten for a while (fasting). You may have this done every 1-3 years.  Bone density scan. This is done to screen for osteoporosis. You may have this done starting at age 34.  Mammogram. This may be done every 1-2 years. Talk to your health care provider about how often you should have regular mammograms. Talk with your health care provider about your test results, treatment options, and if necessary, the need for more tests. Vaccines  Your health care provider may recommend certain vaccines, such  as:  Influenza vaccine. This is recommended every year.  Tetanus, diphtheria, and acellular pertussis (Tdap, Td) vaccine. You may need a Td booster every 10 years.  Zoster vaccine. You may need this after age 11.  Pneumococcal 13-valent conjugate (PCV13) vaccine. One dose is  recommended after age 36.  Pneumococcal polysaccharide (PPSV23) vaccine. One dose is recommended after age 64. Talk to your health care provider about which screenings and vaccines you need and how often you need them. This information is not intended to replace advice given to you by your health care provider. Make sure you discuss any questions you have with your health care provider. Document Released: 01/19/2015 Document Revised: 09/12/2015 Document Reviewed: 10/24/2014 Elsevier Interactive Patient Education  2017 [place] Prevention in the Home Falls can cause injuries. They can happen to people of all ages. There are many things you can do to make your home safe and to help prevent falls. What can I do on the outside of my home?  Regularly fix the edges of walkways and driveways and fix any cracks.  Remove anything that might make you trip as you walk through a door, such as a raised step or threshold.  Trim any bushes or trees on the path to your home.  Use bright outdoor lighting.  Clear any walking paths of anything that might make someone trip, such as rocks or tools.  Regularly check to see if handrails are loose or broken. Make sure that both sides of any steps have handrails.  Any raised decks and porches should have guardrails on the edges.  Have any leaves, snow, or ice cleared regularly.  Use sand or salt on walking paths during winter.  Clean up any spills in your garage right away. This includes oil or grease spills. What can I do in the bathroom?  Use night lights.  Install grab bars by the toilet and in the tub and shower. Do not use towel bars as grab bars.  Use non-skid mats or decals in the tub or shower.  If you need to sit down in the shower, use a plastic, non-slip stool.  Keep the floor dry. Clean up any water that spills on the floor as soon as it happens.  Remove soap buildup in the tub or shower regularly.  Attach bath mats  securely with double-sided non-slip rug tape.  Do not have throw rugs and other things on the floor that can make you trip. What can I do in the bedroom?  Use night lights.  Make sure that you have a light by your bed that is easy to reach.  Do not use any sheets or blankets that are too big for your bed. They should not hang down onto the floor.  Have a firm chair that has side arms. You can use this for support while you get dressed.  Do not have throw rugs and other things on the floor that can make you trip. What can I do in the kitchen?  Clean up any spills right away.  Avoid walking on wet floors.  Keep items that you use a lot in easy-to-reach places.  If you need to reach something above you, use a strong step stool that has a grab bar.  Keep electrical cords out of the way.  Do not use floor polish or wax that makes floors slippery. If you must use wax, use non-skid floor wax.  Do not have throw rugs and other things on the  floor that can make you trip. What can I do with my stairs?  Do not leave any items on the stairs.  Make sure that there are handrails on both sides of the stairs and use them. Fix handrails that are broken or loose. Make sure that handrails are as long as the stairways.  Check any carpeting to make sure that it is firmly attached to the stairs. Fix any carpet that is loose or worn.  Avoid having throw rugs at the top or bottom of the stairs. If you do have throw rugs, attach them to the floor with carpet tape.  Make sure that you have a light switch at the top of the stairs and the bottom of the stairs. If you do not have them, ask someone to add them for you. What else can I do to help prevent falls?  Wear shoes that:  Do not have high heels.  Have rubber bottoms.  Are comfortable and fit you well.  Are closed at the toe. Do not wear sandals.  If you use a stepladder:  Make sure that it is fully opened. Do not climb a closed  stepladder.  Make sure that both sides of the stepladder are locked into place.  Ask someone to hold it for you, if possible.  Clearly mark and make sure that you can see:  Any grab bars or handrails.  First and last steps.  Where the edge of each step is.  Use tools that help you move around (mobility aids) if they are needed. These include:  Canes.  Walkers.  Scooters.  Crutches.  Turn on the lights when you go into a dark area. Replace any light bulbs as soon as they burn out.  Set up your furniture so you have a clear path. Avoid moving your furniture around.  If any of your floors are uneven, fix them.  If there are any pets around you, be aware of where they are.  Review your medicines with your doctor. Some medicines can make you feel dizzy. This can increase your chance of falling. Ask your doctor what other things that you can do to help prevent falls. This information is not intended to replace advice given to you by your health care provider. Make sure you discuss any questions you have with your health care provider. Document Released: 10/19/2008 Document Revised: 05/31/2015 Document Reviewed: 01/27/2014 Elsevier Interactive Patient Education  2017 [employer].

## 2016-08-26 ENCOUNTER — Encounter: Payer: Self-pay | Admitting: Internal Medicine

## 2016-08-26 DIAGNOSIS — E871 Hypo-osmolality and hyponatremia: Secondary | ICD-10-CM | POA: Diagnosis not present

## 2016-08-26 DIAGNOSIS — D649 Anemia, unspecified: Secondary | ICD-10-CM | POA: Diagnosis not present

## 2016-08-27 ENCOUNTER — Non-Acute Institutional Stay: Payer: Medicare Other | Admitting: Internal Medicine

## 2016-08-27 ENCOUNTER — Encounter: Payer: Self-pay | Admitting: Internal Medicine

## 2016-08-27 VITALS — BP 120/60 | HR 55 | Temp 98.8°F | Wt 101.0 lb

## 2016-08-27 DIAGNOSIS — E871 Hypo-osmolality and hyponatremia: Secondary | ICD-10-CM | POA: Diagnosis not present

## 2016-08-27 DIAGNOSIS — R001 Bradycardia, unspecified: Secondary | ICD-10-CM | POA: Insufficient documentation

## 2016-08-27 DIAGNOSIS — I10 Essential (primary) hypertension: Secondary | ICD-10-CM

## 2016-08-27 NOTE — Progress Notes (Signed)
Location:  Occupational psychologist of Service:  Clinic (12)  Provider: Eschol Auxier L. Mariea Clonts, D.O., C.M.D.  Code Status: DNR Goals of Care:  Advanced Directives 08/27/2016  Does Patient Have a Medical Advance Directive? Yes  Type of Advance Directive [place]  Does patient want to make changes to medical advance directive? -  Copy of [place] in Chart? Yes  Would patient like information on creating a medical advance directive? -  Pre-existing out of facility DNR order (yellow form or pink MOST form) -     Chief Complaint  Patient presents with  . Medical Management of Chronic Issues    37mth follow-up    HPI: Patient is a 83 y.o. female seen today for medical management of chronic diseases.    Says her heart rate is low with the atenolol and she does not have pep when walking, but does not want to come off b/c her heart raced.  We discussed that we could replace it but there would be a transition period.    Na normal now at 136.  She has been eating some salty things.  Ankles stay a little swollen but not a lot.    Past Medical History:  Diagnosis Date  . Acute upper respiratory infections of unspecified site 06/09/2011  . Breast cancer ([place]) 12/11/2008  . Fatigue 01/2012  . Hypercholesterolemia    on lipitor  . Hypertension   . Hyponatremia 01/2016  . Mitral valve prolapse   . Other abnormal blood chemistry 01/21/2010  . Palpitations 08/05/2000  . Right bundle branch block   . Senile osteoporosis 09/28/2001    Past Surgical History:  Procedure Laterality Date  . BREAST LUMPECTOMY Right 12/27/2007  . LAPAROSCOPIC LYSIS OF ADHESIONS  07/15/2006   [name]  . OTHER SURGICAL HISTORY    . TONSILLECTOMY    . TUBAL LIGATION  1963    No Known Allergies  Allergies as of 08/27/2016   No Known Allergies     Medication List       Accurate as of 08/27/16  3:38 PM. Always use your most recent med list.            atenolol 25 MG tablet Commonly known as:  TENORMIN TAKE (1/2) TABLET DAILY.   Biotin 5000 MCG Caps Take 1 capsule by mouth daily.   CALCIUM 600 600 MG Tabs tablet Generic drug:  calcium carbonate Take 600 mg by mouth daily with breakfast.   Melatonin 3 MG Caps Take 1 capsule (3 mg total) by mouth at bedtime as needed (sleep).   tamoxifen 20 MG tablet Commonly known as:  NOLVADEX Take 1 tablet (20 mg total) by mouth daily.   Vitamin D3 1000 units Caps Take 1,000 Units by mouth daily.       Review of Systems:  Review of Systems  Constitutional: Positive for malaise/fatigue. Negative for chills and fever.  Eyes: Negative for blurred vision.       Glasses  Respiratory: Negative for cough and shortness of breath.   Cardiovascular: Positive for leg swelling. Negative for chest pain, palpitations, orthopnea, claudication and PND.  Gastrointestinal: Negative for abdominal pain.  Genitourinary: Negative for dysuria.  Musculoskeletal: Negative for falls.  Neurological: Negative for dizziness, loss of consciousness and weakness.  Endo/Heme/Allergies: Does not bruise/bleed easily.  Psychiatric/Behavioral: Negative for depression and memory loss. The patient is nervous/anxious.     Health Maintenance  Topic Date Due  . TETANUS/TDAP  05/16/1943  . INFLUENZA VACCINE  08/06/2016  . MAMMOGRAM  03/06/2017  . DEXA SCAN  Completed  . PNA vac Low Risk Adult  Completed    Physical Exam: Vitals:   08/27/16 1525  BP: 120/60  Pulse: (!) 55  Temp: 98.8 F (37.1 C)  TempSrc: Oral  SpO2: 97%  Weight: 101 lb (45.8 kg)   Body mass index is 19.08 kg/m. Physical Exam  Constitutional: She is oriented to person, place, and time. She appears well-developed. No distress.  Cardiovascular: Normal rate, regular rhythm, normal heart sounds and intact distal pulses.   Pulmonary/Chest: Effort normal and breath sounds normal. No respiratory distress.  Abdominal: Bowel sounds are normal.   Musculoskeletal: Normal range of motion.  Neurological: She is alert and oriented to person, place, and time.  Skin: Skin is warm and dry.  Psychiatric: She has a normal mood and affect.  Seems more worried since her rehab stay and hospitalization     Labs reviewed: Basic Metabolic Panel:  Recent Labs  09/05/15 1456  01/23/16 0815 01/23/16 1025  01/24/16 0301  01/25/16 0543  01/27/16 0241  01/29/16 0605  02/04/16 1210 02/07/16 0300 05/13/16  NA  --   < > 106* 107*  < > 112*  < > 122*  < > 126*  < > 129*  < > 133* 136* 137  K  --   < > 3.5 3.6  < >  --   < >  --   < > 3.8  --  3.4*  < > 4.9 4.5 4.2  CL  --   < > 73* 75*  < >  --   < >  --   < > 91*  --  91*  --  102  --   --   CO2  --   < > 20* 20*  < >  --   < >  --   < > 27  --  29  --  26  --   --   GLUCOSE  --   < > 153* 149*  < >  --   < >  --   < > 90  --  93  --  94  --   --   BUN  --   < > 7 6  < >  --   < >  --   < > 23*  --  25*  < > 20 23* 15  CREATININE  --   < > 0.60 0.60  < >  --   < >  --   < > 0.91  --  0.88  < > 0.73 0.8 0.8  CALCIUM  --   < > 7.6* 7.5*  < >  --   < >  --   < > 8.7*  --  8.7*  --  8.4*  --   --   MG  --   --  1.4*  --   --  2.1  --  2.4  --   --   --   --   --   --   --   --   PHOS  --   --   --  2.2*  --  2.0*  --  2.1*  --   --   --   --   --   --   --   --   TSH 1.99  --  1.505  --   --   --   --   --   --   --   --   --   --   --   --   --   < > =  values in this interval not displayed. Liver Function Tests:  Recent Labs  09/24/15 1514 01/23/16 0558 01/27/16 0241  AST 24 31 27   ALT 17 18 22   ALKPHOS 48 39 43  BILITOT 0.37 1.2 0.7  PROT 6.2* 6.0* 5.5*  ALBUMIN 3.5 3.7 3.2*    Recent Labs  01/23/16 0558  LIPASE 21   No results for input(s): AMMONIA in the last 8760 hours. CBC:  Recent Labs  09/24/15 1514 01/23/16 0558  01/24/16 0301 01/25/16 0543 02/04/16 1210  WBC 6.5 3.4*  < > 8.2 5.9 7.7  NEUTROABS 3.9 2.6  --   --   --  4.8  HGB 12.7 13.2  < > 13.8 14.7 11.5*   HCT 38.0 35.2*  < > 36.4 38.9 33.4*  MCV 99.5 88.4  < > 88.6 88.6 96.0  PLT 232 149*  < > 219 227 303  < > = values in this interval not displayed. Lipid Panel:  Recent Labs  01/27/16 0241  CHOL 214*  HDL 62  LDLCALC 133*  TRIG 96  CHOLHDL 3.5   Lab Results  Component Value Date   HGBA1C 5.9 (H) 01/23/2016    Assessment/Plan 1. Hyponatremia -improved at 136 -cont current higher sodium intake and monitor--mild edema in feet, but no signs of chf  2. Essential hypertension, benign -bp at goal with current regimen, cont same and monitor  3. Bradycardia -due to atenolol, says her exercise tolerance has decreased--seems due to increased frailty with age more than anything else, did not want medication changed (has been on the atenolol long term so unlikely to be cause)  Labs/tests ordered:  Bmp before Next appt:  3 mos f/u sodium  Jancarlos Thrun L. Areana Kosanke, D.O. [facility] [address], [place] [address] Cell Phone (Mon-Fri 8am-5pm):  [phone] On Call:  [phone] & follow prompts after 5pm & weekends Office Phone:  [phone] Office Fax:  [phone]

## 2016-09-09 DIAGNOSIS — R413 Other amnesia: Secondary | ICD-10-CM | POA: Diagnosis not present

## 2016-09-09 DIAGNOSIS — R5383 Other fatigue: Secondary | ICD-10-CM | POA: Diagnosis not present

## 2016-09-09 DIAGNOSIS — D649 Anemia, unspecified: Secondary | ICD-10-CM | POA: Diagnosis not present

## 2016-09-09 DIAGNOSIS — D519 Vitamin B12 deficiency anemia, unspecified: Secondary | ICD-10-CM | POA: Diagnosis not present

## 2016-09-09 DIAGNOSIS — E559 Vitamin D deficiency, unspecified: Secondary | ICD-10-CM | POA: Diagnosis not present

## 2016-09-09 DIAGNOSIS — E039 Hypothyroidism, unspecified: Secondary | ICD-10-CM | POA: Diagnosis not present

## 2016-09-09 LAB — TSH: TSH: 2.1 (ref 0.41–5.90)

## 2016-09-09 LAB — VITAMIN B12: Vitamin B-12: 450

## 2016-09-09 LAB — VITAMIN D 25 HYDROXY (VIT D DEFICIENCY, FRACTURES): Vit D, 25-Hydroxy: 34.49

## 2016-09-10 ENCOUNTER — Other Ambulatory Visit: Payer: Self-pay | Admitting: Internal Medicine

## 2016-09-10 DIAGNOSIS — E559 Vitamin D deficiency, unspecified: Secondary | ICD-10-CM

## 2016-09-10 MED ORDER — VITAMIN D (ERGOCALCIFEROL) 1.25 MG (50000 UNIT) PO CAPS: 50000.0000 [IU] | ORAL_CAPSULE | ORAL | 0 refills | Status: DC

## 2016-09-10 NOTE — Progress Notes (Signed)
Vitamin D2 ordered.  Vitamin D3 updated.

## 2016-09-11 ENCOUNTER — Encounter: Payer: Self-pay | Admitting: Internal Medicine

## 2016-09-15 ENCOUNTER — Other Ambulatory Visit: Payer: Self-pay

## 2016-09-15 DIAGNOSIS — Z170 Estrogen receptor positive status [ER+]: Principal | ICD-10-CM

## 2016-09-15 DIAGNOSIS — C50511 Malignant neoplasm of lower-outer quadrant of right female breast: Secondary | ICD-10-CM

## 2016-09-16 ENCOUNTER — Other Ambulatory Visit (HOSPITAL_BASED_OUTPATIENT_CLINIC_OR_DEPARTMENT_OTHER): Payer: Medicare Other

## 2016-09-16 DIAGNOSIS — C50511 Malignant neoplasm of lower-outer quadrant of right female breast: Secondary | ICD-10-CM

## 2016-09-16 DIAGNOSIS — Z170 Estrogen receptor positive status [ER+]: Principal | ICD-10-CM

## 2016-09-16 LAB — COMPREHENSIVE METABOLIC PANEL
ALBUMIN: 3.7 g/dL (ref 3.5–5.0)
ALK PHOS: 45 U/L (ref 40–150)
ALT: 11 U/L (ref 0–55)
AST: 18 U/L (ref 5–34)
Anion Gap: 8 mEq/L (ref 3–11)
BUN: 21.5 mg/dL (ref 7.0–26.0)
CO2: 29 mEq/L (ref 22–29)
Calcium: 9.5 mg/dL (ref 8.4–10.4)
Chloride: 96 mEq/L — ABNORMAL LOW (ref 98–109)
Creatinine: 0.8 mg/dL (ref 0.6–1.1)
EGFR: 61 mL/min/{1.73_m2} — ABNORMAL LOW (ref >90)
GLUCOSE: 87 mg/dL (ref 70–140)
POTASSIUM: 4.8 meq/L (ref 3.5–5.1)
SODIUM: 132 meq/L — AB (ref 136–145)
TOTAL PROTEIN: 6.5 g/dL (ref 6.4–8.3)
Total Bilirubin: 0.3 mg/dL (ref 0.20–1.20)

## 2016-09-16 LAB — CBC WITH DIFFERENTIAL/PLATELET
BASO%: 0.3 % (ref 0.0–2.0)
Basophils Absolute: 0 10*3/uL (ref 0.0–0.1)
EOS%: 0.6 % (ref 0.0–7.0)
Eosinophils Absolute: 0 10*3/uL (ref 0.0–0.5)
HCT: 37.6 % (ref 34.8–46.6)
HEMOGLOBIN: 12.8 g/dL (ref 11.6–15.9)
LYMPH%: 34.5 % (ref 14.0–49.7)
MCH: 34 pg (ref 25.1–34.0)
MCHC: 34 g/dL (ref 31.5–36.0)
MCV: 99.7 fL (ref 79.5–101.0)
MONO#: 0.5 10*3/uL (ref 0.1–0.9)
MONO%: 6.4 % (ref 0.0–14.0)
NEUT%: 58.2 % (ref 38.4–76.8)
NEUTROS ABS: 4.2 10*3/uL (ref 1.5–6.5)
Platelets: 227 10*3/uL (ref 145–400)
RBC: 3.77 10*6/uL (ref 3.70–5.45)
RDW: 14 % (ref 11.2–14.5)
WBC: 7.2 10*3/uL (ref 3.9–10.3)
lymph#: 2.5 10*3/uL (ref 0.9–3.3)

## 2016-09-17 LAB — DRAW EXTRA CLOT TUBE

## 2016-09-22 ENCOUNTER — Other Ambulatory Visit: Payer: Medicare Other

## 2016-09-23 ENCOUNTER — Ambulatory Visit (HOSPITAL_BASED_OUTPATIENT_CLINIC_OR_DEPARTMENT_OTHER): Payer: Medicare Other

## 2016-09-23 ENCOUNTER — Ambulatory Visit (HOSPITAL_BASED_OUTPATIENT_CLINIC_OR_DEPARTMENT_OTHER): Payer: Medicare Other | Admitting: Oncology

## 2016-09-23 ENCOUNTER — Telehealth: Payer: Self-pay | Admitting: Oncology

## 2016-09-23 VITALS — BP 150/57 | HR 61 | Temp 97.9°F | Resp 17 | Ht 61.0 in | Wt 100.9 lb

## 2016-09-23 DIAGNOSIS — M810 Age-related osteoporosis without current pathological fracture: Secondary | ICD-10-CM

## 2016-09-23 DIAGNOSIS — Z170 Estrogen receptor positive status [ER+]: Secondary | ICD-10-CM

## 2016-09-23 DIAGNOSIS — C50511 Malignant neoplasm of lower-outer quadrant of right female breast: Secondary | ICD-10-CM | POA: Diagnosis not present

## 2016-09-23 DIAGNOSIS — Z79811 Long term (current) use of aromatase inhibitors: Secondary | ICD-10-CM

## 2016-09-23 MED ORDER — SODIUM CHLORIDE 0.9 % IV SOLN
3.0000 mg | Freq: Once | INTRAVENOUS | Status: AC
  Administered 2016-09-23: 3 mg via INTRAVENOUS
  Filled 2016-09-23: qty 3.75

## 2016-09-23 MED ORDER — SODIUM CHLORIDE 0.9 % IV SOLN
Freq: Once | INTRAVENOUS | Status: AC
  Administered 2016-09-23: 11:00:00 via INTRAVENOUS

## 2016-09-23 NOTE — Telephone Encounter (Signed)
Gave patient avs and calendar per 9/18 los

## 2016-09-23 NOTE — Patient Instructions (Signed)
[facility] Discharge Instructions for Patients Receiving Chemotherapy  Today you received the following chemotherapy agents Zometa.  To help prevent nausea and vomiting after your treatment, we encourage you to take your nausea medication as directed.  If you develop nausea and vomiting that is not controlled by your nausea medication, call the clinic.   BELOW ARE SYMPTOMS THAT SHOULD BE REPORTED IMMEDIATELY:  *FEVER GREATER THAN 100.5 F  *CHILLS WITH OR WITHOUT FEVER  NAUSEA AND VOMITING THAT IS NOT CONTROLLED WITH YOUR NAUSEA MEDICATION  *UNUSUAL SHORTNESS OF BREATH  *UNUSUAL BRUISING OR BLEEDING  TENDERNESS IN MOUTH AND THROAT WITH OR WITHOUT PRESENCE OF ULCERS  *URINARY PROBLEMS  *BOWEL PROBLEMS  UNUSUAL RASH Items with * indicate a potential emergency and should be followed up as soon as possible.  Feel free to call the clinic you have any questions or concerns. The clinic phone number is [phone].  Please show the CHEMO ALERT CARD at check-in to the Emergency Department and triage nurse.    

## 2016-09-23 NOTE — Progress Notes (Addendum)
ID: Cynthia Watkins OB: 11/20/1924  MR#: [id number]  RSW#:[id number]  PCP: Cynthia Curry, DO GYN:   SU:  OTHER MD: [name],  [name]  CHIEF COMPLAINT: Estrogen receptor positive breast cancer  CURRENT TREATMENT: Tamoxifen   HISTORY OF PRESENT ILLNESS:  From doctor Livesay's intake note 02/23/2012:  "History is of T2NX intermediate grade invasive ductal carcinoma of right breast diagnosed with mammogram 11-04-2007 and biopsy 11-11-2007, ER + 98%, PR + 32 %, HER 2 was 3+ and proliferative index 14%. Preoperative MRI showed solitary mass. She had lumpectomy 12-27-2007 with 1.5 cm grade 3 invasive ductal carcinoma, no axillary nodes evaluated. She had RT to breast and axilla by Dr [name]. Patient was not inclined to pursue adjuvant chemotherapy or herceptin at diagnosis. She was on Femara from 03/2008 thru 11/2009 when this was changed to tamoxifen due to osteoporosis. She has tolerated the tamoxifen without difficulty. She has not had hysterectomy. Last mammograms were at [facility] 11-21-2011 with scattered fibroglandular densities and no mammographic findings of concern. She had unremarkable CXR with ED evaluation for infectious cough in Jan 2014."  The patient's subsequent history is as detailed below  INTERVAL HISTORY: Cynthia Watkins returns today for followup of her estrogen receptor positive breast cancer. She continues on tamoxifen with no noted hot flashes, but mild increased vaginal discharge. Pt reports that her sodium collapsed in January and she contributes it to her prior episode of diarrhea to which she was informed by the RN to drink lots of fluids.    REVIEW OF SYSTEMS: Cynthia Watkins reports that she ambulates 1 hour a day and ensures that she goes up steps. She notes decreased energy levels and insomnia. She denies unusual headaches, visual changes, nausea, vomiting, or dizziness. There has been no unusual cough, phlegm production, or pleurisy. This been no change in bowel or bladder  habits. She denies unexplained fatigue or unexplained weight loss, bleeding, rash, or fever. A detailed review of systems was otherwise negative.    PAST MEDICAL HISTORY: Past Medical History:  Diagnosis Date  . Acute upper respiratory infections of unspecified site 06/09/2011  . Breast cancer ([place]) 12/11/2008  . Fatigue 01/2012  . Hypercholesterolemia    on lipitor  . Hypertension   . Hyponatremia 01/2016  . Memory loss   . Mitral valve prolapse   . Other abnormal blood chemistry 01/21/2010  . Palpitations 08/05/2000  . Right bundle branch block   . Senile osteoporosis 09/28/2001    PAST SURGICAL HISTORY: Past Surgical History:  Procedure Laterality Date  . BREAST LUMPECTOMY Right 12/27/2007  . LAPAROSCOPIC LYSIS OF ADHESIONS  07/15/2006   [name]  . OTHER SURGICAL HISTORY    . TONSILLECTOMY    . TUBAL LIGATION  1963    FAMILY HISTORY Family History  Problem Relation Age of Onset  . Stroke Mother   . Cancer Father        colon  . Alzheimer's disease Sister    the patient's father had a history of colon cancer. 2 of his sisters had breast cancer, but not diagnosed before age 39. The patient's mother lived to be 71. The patient's sister was diagnosed with breast cancer in her 50s.  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 Her husband, [name] was a pediatric surgeon and died earlier this year. Her son [name] of course is a urologist locally. The patient also  has children in [place] and [place]. Her fourth child unfortunately died in a drowning accident. She has 7 grandchildren and 2 stepgrandchildren. She attends the [employer]; her son [name] is her healthcare power of attorney   HEALTH MAINTENANCE: Social History  Substance Use Topics  . Smoking status: Former Smoker    Packs/day: 0.25    Years: 10.00    Quit date:  08/06/1954  . Smokeless tobacco: Never Used  . Alcohol use Yes     Comment: Occasionally      Colonoscopy:  PAP:  Bone density: November 2013/[facility]/T - 3.3  Lipid panel:  No Known Allergies  Current Outpatient Prescriptions  Medication Sig Dispense Refill  . atenolol (TENORMIN) 25 MG tablet TAKE (1/2) TABLET DAILY. (Patient taking differently: TAKE (1/2) TABLET DAILY.  (patient taking prn for high BP)) 45 tablet 1  . Biotin 5000 MCG CAPS Take 1 capsule by mouth daily.     . calcium carbonate (CALCIUM 600) 600 MG TABS tablet Take 600 mg by mouth daily with breakfast.    . Cholecalciferol (VITAMIN D3) 1000 units CAPS Take 2,000 Units by mouth daily.    . Melatonin 3 MG CAPS Take 1 capsule (3 mg total) by mouth at bedtime as needed (sleep). 30 capsule 0  . tamoxifen (NOLVADEX) 20 MG tablet Take 1 tablet (20 mg total) by mouth daily. 90 tablet PRN  . Vitamin D, Ergocalciferol, (DRISDOL) 50000 units CAPS capsule Take 1 capsule (50,000 Units total) by mouth every 7 (seven) days. 8 capsule 0   No current facility-administered medications for this visit.     OBJECTIVE: Elderly white woman Who appears well  Vitals:   09/23/16 0940  BP: (!) 150/57  Pulse: 61  Resp: 17  Temp: 97.9 F (36.6 C)  SpO2: 100%     Body mass index is 19.06 kg/m.    ECOG FS:0 - Asymptomatic   Sclerae unicteric, EOMs intact Oropharynx clear and moist No cervical or supraclavicular adenopathy Lungs no rales or rhonchi Heart regular rate and rhythm Abd soft, nontender, positive bowel sounds MSK scoliosis but no focal spinal tenderness, no upper extremity lymphedema Neuro: nonfocal, well oriented, appropriate affect Breasts:  The right breast is status post lumpectomy and radiation with no evidence of local recurrence. The left breast is benign. Both axillae are benign.  LAB RESULTS:  CMP     Component Value Date/Time   NA 132 (L) 09/16/2016 1435   K 4.8 09/16/2016 1435   CL 102 02/04/2016 1210   CL  98 02/17/2012 1500   CO2 29 09/16/2016 1435   GLUCOSE 87 09/16/2016 1435   GLUCOSE 134 (H) 02/17/2012 1500   BUN 21.5 09/16/2016 1435   CREATININE 0.8 09/16/2016 1435   CALCIUM 9.5 09/16/2016 1435   PROT 6.5 09/16/2016 1435   ALBUMIN 3.7 09/16/2016 1435   AST 18 09/16/2016 1435   ALT 11 09/16/2016 1435   ALKPHOS 45 09/16/2016 1435   BILITOT 0.30 09/16/2016 1435   GFRNONAA >60 02/04/2016 1210   GFRAA >60 02/04/2016 1210    I No results found for: SPEP  Lab Results  Component Value Date   WBC 7.2 09/16/2016   NEUTROABS 4.2 09/16/2016   HGB 12.8 09/16/2016   HCT 37.6 09/16/2016   MCV 99.7 09/16/2016   PLT 227 09/16/2016      Chemistry      Component Value Date/Time   NA 132 (L) 09/16/2016 1435   K 4.8  09/16/2016 1435   CL 102 02/04/2016 1210   CL 98 02/17/2012 1500   CO2 29 09/16/2016 1435   BUN 21.5 09/16/2016 1435   CREATININE 0.8 09/16/2016 1435   GLU 100 05/13/2016      Component Value Date/Time   CALCIUM 9.5 09/16/2016 1435   ALKPHOS 45 09/16/2016 1435   AST 18 09/16/2016 1435   ALT 11 09/16/2016 1435   BILITOT 0.30 09/16/2016 1435       Lab Results  Component Value Date   LABCA2 33 02/17/2012    No components found for: [id number]  No results for input(s): INR in the last 168 hours.  Urinalysis    Component Value Date/Time   COLORURINE STRAW (A) 02/04/2016 1229   APPEARANCEUR CLEAR 02/04/2016 1229   LABSPEC 1.004 (L) 02/04/2016 1229   PHURINE 8.0 02/04/2016 1229   GLUCOSEU NEGATIVE 02/04/2016 1229   HGBUR SMALL (A) 02/04/2016 1229   BILIRUBINUR NEGATIVE 02/04/2016 [place] 02/04/2016 1229   PROTEINUR NEGATIVE 02/04/2016 1229   UROBILINOGEN 0.2 12/23/2007 0945   NITRITE NEGATIVE 02/04/2016 1229   LEUKOCYTESUR NEGATIVE 02/04/2016 1229    STUDIES: CT scan of the chest injury 19 2018 showed multiple small lung nodules, with a recommendation for repeat CT of the chest January 2019.  ASSESSMENT: 83 y.o. [facility] resident  status post right breast lower outer quadrant lumpectomy 12/27/2007 for a pT1c pNX, stage IA invasive ductal carcinoma, grade 3, estrogen receptor 98% positive, progesterone receptor 32% positive, with an MIB-1 of 14% and HercepTest positive at 3+  (1) declined adjuvant chemotherapy/trastuzumab  (2) completed adjuvant radiation to the right breast and axilla February 2010  (3) antiestrogen therapy: Anastrozole March 2010 through November 2011, at which point she switched to tamoxifen  (4) osteoporosis;  DEXA scan at [facility] 11/21/2011 showed a T score of -3.3  (a) zolendronate given 09/22/2013,   (b) repeat bone density scan at [facility] January 2017 showed a T score of -3.5  (c) zolendronate repeated 10/01/2015  PLAN: Valarie is now just about 9 years out from definitive surgery for her breast cancer with no evidence of disease recurrence. This is very favorable.  She continues on tamoxifen, with excellent tolerance. Originally we were thinking of doing tamoxifen for a total of 10 years, which would take [place] to March 2020. We will consider finishing follow-up next year when she will be 10 years out from her original surgery  She did have a CT of the chest January of this year obtained for other reasons and incidentally showing multiple small lung lesions. In my opinion this really does not require further follow-up. This was discussed today  She will receive zolendronate again today. I do not have a copy of this year's mammogram or the most recent bone density but this is being requested.  She will see me one last time a year from now. She knows to call for any problems that may develop before her next visit here.  Winter Jocelyn, Virgie Dad, MD  09/23/16 10:06 AM Medical Oncology and Hematology [facility] [address] [place], [place] [address] Tel. [phone]    Fax. [phone]   This document serves as a record of services personally performed by Lurline Del, MD. It was  created on her behalf by Steva Colder, a trained medical scribe. The creation of this record is based on the scribe's personal observations and the provider's statements to them. This document has been checked and approved by the attending provider.  ADDENDUM: Su had mammography at [facility] 03/06/2016 showing a breast density of category B. There was no evidence of malignancy.  Her most recent bone density was 12/18/2014. It showed a T score of -3.5, consistent with osteoporosis.

## 2016-09-29 ENCOUNTER — Ambulatory Visit: Payer: Medicare Other

## 2016-09-29 ENCOUNTER — Ambulatory Visit: Payer: Medicare Other | Admitting: Oncology

## 2016-10-03 ENCOUNTER — Other Ambulatory Visit: Payer: Self-pay

## 2016-10-03 MED ORDER — ZOSTER VAC RECOMB ADJUVANTED 50 MCG/0.5ML IM SUSR: 0.5000 mL | Freq: Once | INTRAMUSCULAR | 1 refills | Status: AC

## 2016-10-03 NOTE — Telephone Encounter (Signed)
RX mailed to patient per her request

## 2016-11-13 ENCOUNTER — Other Ambulatory Visit: Payer: Self-pay | Admitting: Oncology

## 2016-11-13 DIAGNOSIS — C50511 Malignant neoplasm of lower-outer quadrant of right female breast: Secondary | ICD-10-CM

## 2016-12-05 ENCOUNTER — Telehealth: Payer: Self-pay

## 2016-12-05 NOTE — Telephone Encounter (Signed)
Cynthia Watkins   Watkins called to request referral to dermatology for raised areas on face and to request recheck on sodium. Watkins states she has a history of low sodium.  Last sodium check was 09/16/16 (132),low  Please advise on request

## 2016-12-05 NOTE — Telephone Encounter (Signed)
Sodium 132 was good for her.  She is very worried about this now since she had to be hospitalized for it at one point and in rehab.  We can set up for her to have a BMP next Tuesday at [facility]--fax a copy of this to Bernadette/Kim in clinic as the order.   Ok with derm referral.  Please find out if she wants to be seen at [facility] or to go outside for the referral?  If she's going at [facility], I believe she can call or visit the health care front desk and Janett Billow there can arrange the appt for her.

## 2016-12-05 NOTE — Telephone Encounter (Signed)
Spoke with patient, patient ok to get labs next Tuesday.   BMP, diagnosis low sodium E87.1  Phone encounter to be faxed to Clinic Nurse to contact patient with time of labs for 12/09/16 (next Tuesday)  Patient is ok to see the dermatologist at [facility], patients states she tried to set this up on her own and was told she had to have a referral from Dr.Reed. Patient aware Dr.Reed will be at [facility] on Tuesday and if they need something in writing from her she can provide it at that time.

## 2016-12-08 NOTE — Telephone Encounter (Signed)
Spoke with Cynthia Watkins and order done

## 2016-12-08 NOTE — Telephone Encounter (Signed)
Dee, can you make sure all of this is taken care of for [facility]?

## 2016-12-09 DIAGNOSIS — D649 Anemia, unspecified: Secondary | ICD-10-CM | POA: Diagnosis not present

## 2016-12-09 DIAGNOSIS — E871 Hypo-osmolality and hyponatremia: Secondary | ICD-10-CM | POA: Diagnosis not present

## 2016-12-09 LAB — BASIC METABOLIC PANEL
BUN: 18 (ref 4–21)
CREATININE: 0.6 (ref 0.5–1.1)
GLUCOSE: 99
POTASSIUM: 4.4 (ref 3.4–5.3)
Sodium: 138 (ref 137–147)

## 2016-12-10 ENCOUNTER — Telehealth: Payer: Self-pay | Admitting: *Deleted

## 2016-12-10 ENCOUNTER — Encounter: Payer: Self-pay | Admitting: *Deleted

## 2016-12-10 NOTE — Telephone Encounter (Signed)
Called pt to advise that Sodium was normal (138), no answer on home number and cell phone VM not set up, will try again later

## 2016-12-10 NOTE — Telephone Encounter (Signed)
Kim nurse at [facility] talked to pt and advised lab results

## 2016-12-22 ENCOUNTER — Telehealth: Payer: Self-pay

## 2016-12-22 NOTE — Telephone Encounter (Signed)
Let's put her on the schedule for Wednesday.

## 2016-12-22 NOTE — Telephone Encounter (Signed)
Spoke with bernadette and advised we added pt to our schedule on Wednesday. Could not reach patient

## 2016-12-22 NOTE — Telephone Encounter (Signed)
Patient called complaining of cold/upper respiratory/sinus symptoms/flu-like symptoms  1. Fever? No 2. Chills? A little, not shaky chills though  3. Myalgias?  No 4. How long have you had your symptoms? Onset 5 pm yesterday, patient feels off and unsteady (only way she could explain)  5. Are you coughing up mucus and if yes any discoloration? No 6. What have you done for your symptoms thus far? Patient took 2 tylenol yesterday  7. Have you been around anyone sick? No  Patient was evaluated by [facility] Nurse), vitals normal and was instructed to call Dr.Reed. I offered to schedule patient an appointment, patient refused and stated she dose not feel like coming out     Patient aware I will forward response provider and call you with further instructions

## 2016-12-24 ENCOUNTER — Non-Acute Institutional Stay: Payer: Medicare Other | Admitting: Internal Medicine

## 2016-12-24 ENCOUNTER — Encounter: Payer: Self-pay | Admitting: Internal Medicine

## 2016-12-24 VITALS — BP 120/60 | HR 59 | Temp 98.5°F | Wt 103.0 lb

## 2016-12-24 DIAGNOSIS — R143 Flatulence: Secondary | ICD-10-CM

## 2016-12-24 DIAGNOSIS — R6889 Other general symptoms and signs: Secondary | ICD-10-CM

## 2016-12-24 DIAGNOSIS — I10 Essential (primary) hypertension: Secondary | ICD-10-CM

## 2016-12-24 DIAGNOSIS — E871 Hypo-osmolality and hyponatremia: Secondary | ICD-10-CM | POA: Diagnosis not present

## 2016-12-24 MED ORDER — SACCHAROMYCES BOULARDII 250 MG PO CAPS: 250.0000 mg | ORAL_CAPSULE | Freq: Two times a day (BID) | ORAL | 3 refills | Status: DC

## 2016-12-24 NOTE — Progress Notes (Signed)
Location:  Occupational psychologist of Service:  Clinic (12)  Provider: Venisha Boehning L. Mariea Clonts, D.O., C.M.D.  Code Status: DNR Goals of Care:  Advanced Directives 12/24/2016  Does Patient Have a Medical Advance Directive? Yes  Type of Advance Directive [place]  Does patient want to make changes to medical advance directive? No - Patient declined  Copy of [place] in Chart? Yes  Would patient like information on creating a medical advance directive? -  Pre-existing out of facility DNR order (yellow form or pink MOST form) -     Chief Complaint  Patient presents with  . Acute Visit    flu like symptoms    HPI: Patient is a 83 y.o. female seen today for an acute visit for flu-like symptoms she had Monday.  She really did not want an appt.   She had shivery chills, but no fever for 24 hrs--felt like the flu.  Mliss Sax checked her.  No cough.  Now feels perfectly fine.  Looks the same.  No issues.  She apparently stopped her vitamin D on her own.  Counseled on purpose and recommended restarting.   She is taking only 1/4 atenolol pill daily.  She was dizzy and weak with more.  Says cardiology is aware of this dosage.  Having a lot of intestinal gas.  Takes anti-gas tablets.  Does not think she eats gassy foods though I know she puts walnuts on her yogurt.    Past Medical History:  Diagnosis Date  . Acute upper respiratory infections of unspecified site 06/09/2011  . Breast cancer ([place]) 12/11/2008  . Fatigue 01/2012  . Hypercholesterolemia    on lipitor  . Hypertension   . Hyponatremia 01/2016  . Memory loss   . Mitral valve prolapse   . Other abnormal blood chemistry 01/21/2010  . Palpitations 08/05/2000  . Right bundle branch block   . Senile osteoporosis 09/28/2001    Past Surgical History:  Procedure Laterality Date  . BREAST LUMPECTOMY Right 12/27/2007  . LAPAROSCOPIC LYSIS OF ADHESIONS  07/15/2006   [name]  . OTHER  SURGICAL HISTORY    . TONSILLECTOMY    . TUBAL LIGATION  1963    No Known Allergies  Outpatient Encounter Medications as of 12/24/2016  Medication Sig  . atenolol (TENORMIN) 25 MG tablet TAKE (1/2) TABLET DAILY.  Marland Kitchen Biotin 5000 MCG CAPS Take 1 capsule by mouth daily.   . calcium carbonate (CALCIUM 600) 600 MG TABS tablet Take 600 mg by mouth daily with breakfast.  . Cholecalciferol (VITAMIN D3) 1000 units CAPS Take 2,000 Units by mouth daily.  . Melatonin 3 MG CAPS Take 1 capsule (3 mg total) by mouth at bedtime as needed (sleep).  . tamoxifen (NOLVADEX) 20 MG tablet TAKE 1 TABLET ONCE DAILY.  Marland Kitchen Vitamin D, Ergocalciferol, (DRISDOL) 50000 units CAPS capsule Take 1 capsule (50,000 Units total) by mouth every 7 (seven) days.   No facility-administered encounter medications on file as of 12/24/2016.     Review of Systems:  Review of Systems  Constitutional: Negative for chills, fever and malaise/fatigue.  HENT: Negative for congestion.   Eyes: Negative for blurred vision.       Glasses  Respiratory: Negative for cough and shortness of breath.   Cardiovascular: Negative for chest pain.  Gastrointestinal: Negative for abdominal pain, blood in stool, constipation, diarrhea, heartburn, melena, nausea and vomiting.       Flatus  Genitourinary: Negative for dysuria.  Musculoskeletal: Negative for falls.  Skin: Negative for itching and rash.  Neurological: Negative for dizziness, loss of consciousness and weakness.  Endo/Heme/Allergies: Does not bruise/bleed easily.  Psychiatric/Behavioral: Positive for memory loss. The patient is nervous/anxious.        Mild memory loss, seems to fluctuate with sodium which was just normal    Health Maintenance  Topic Date Due  . TETANUS/TDAP  05/16/1943  . INFLUENZA VACCINE  08/06/2016  . MAMMOGRAM  03/06/2017  . DEXA SCAN  Completed  . PNA vac Low Risk Adult  Completed    Physical Exam: Vitals:   12/24/16 1504  BP: 120/60  Pulse: (!) 59    Temp: 98.5 F (36.9 C)  TempSrc: Oral  SpO2: 96%  Weight: 103 lb (46.7 kg)   Body mass index is 19.46 kg/m. Physical Exam  Constitutional: She is oriented to person, place, and time. She appears well-developed and well-nourished. No distress.  Thin white female  Cardiovascular: Normal rate, regular rhythm, normal heart sounds and intact distal pulses.  Pulmonary/Chest: Effort normal and breath sounds normal. No respiratory distress.  Abdominal: Soft. Bowel sounds are normal.  Musculoskeletal: Normal range of motion.  Ambulates without assistive device  Neurological: She is alert and oriented to person, place, and time.  Skin: Skin is warm and dry. There is pallor.  Psychiatric:  More anxious ever since her hyponatremia hospitalization (but denies it just says she is more concerned about her health which has always been good)    Labs reviewed: Basic Metabolic Panel: Recent Labs    01/23/16 0815 01/23/16 1025  01/24/16 0301  01/25/16 0543  01/27/16 0241  01/29/16 0605  02/04/16 1210  05/13/16 09/09/16 09/16/16 1435 12/09/16  NA 106* 107*   < > 112*   < > 122*   < > 126*   < > 129*   < > 133*   < > 137  --  132* 138  K 3.5 3.6   < >  --    < >  --    < > 3.8  --  3.4*   < > 4.9   < > 4.2  --  4.8 4.4  CL 73* 75*   < >  --    < >  --    < > 91*  --  91*  --  102  --   --   --   --   --   CO2 20* 20*   < >  --    < >  --    < > 27  --  29  --  26  --   --   --  29  --   GLUCOSE 153* 149*   < >  --    < >  --    < > 90  --  93  --  94  --   --   --  87  --   BUN 7 6   < >  --    < >  --    < > 23*  --  25*   < > 20   < > 15  --  21.5 18  CREATININE 0.60 0.60   < >  --    < >  --    < > 0.91  --  0.88   < > 0.73   < > 0.8  --  0.8 0.6  CALCIUM 7.6* 7.5*   < >  --    < >  --    < >  8.7*  --  8.7*  --  8.4*  --   --   --  9.5  --   MG 1.4*  --   --  2.1  --  2.4  --   --   --   --   --   --   --   --   --   --   --   PHOS  --  2.2*  --  2.0*  --  2.1*  --   --   --   --   --   --    --   --   --   --   --   TSH 1.505  --   --   --   --   --   --   --   --   --   --   --   --   --  2.10  --   --    < > = values in this interval not displayed.   Liver Function Tests: Recent Labs    01/23/16 0558 01/27/16 0241 09/16/16 1435  AST 31 27 18   ALT 18 22 11   ALKPHOS 39 43 45  BILITOT 1.2 0.7 0.30  PROT 6.0* 5.5* 6.5  ALBUMIN 3.7 3.2* 3.7   Recent Labs    01/23/16 0558  LIPASE 21   No results for input(s): AMMONIA in the last 8760 hours. CBC: Recent Labs    01/23/16 0558  01/25/16 0543 02/04/16 1210 09/16/16 1435  WBC 3.4*   < > 5.9 7.7 7.2  NEUTROABS 2.6  --   --  4.8 4.2  HGB 13.2   < > 14.7 11.5* 12.8  HCT 35.2*   < > 38.9 33.4* 37.6  MCV 88.4   < > 88.6 96.0 99.7  PLT 149*   < > 227 303 227   < > = values in this interval not displayed.   Lipid Panel: Recent Labs    01/27/16 0241  CHOL 214*  HDL 62  LDLCALC 133*  TRIG 96  CHOLHDL 3.5   Lab Results  Component Value Date   HGBA1C 5.9 (H) 01/23/2016    Assessment/Plan 1. Flu-like symptoms -resolved on its own in 24 hrs  2. Hyponatremia -Na wnl 12/09/16 at 138  3. Essential hypertension, benign -bp runs on low side, uses only 1/4 atenolol  4.  Excessive flatus -try florastor 250mg  po bid probiotic to see if this controls it  Labs/tests ordered:  No new today Next appt:  Reschedule for March--85mos med mgt  Kaison Mcparland L. Vittorio Mohs, D.O. [facility] [address], [place] [address] Cell Phone (Mon-Fri 8am-5pm):  [phone] On Call:  [phone] & follow prompts after 5pm & weekends Office Phone:  [phone] Office Fax:  [phone]

## 2017-01-07 ENCOUNTER — Encounter: Payer: Self-pay | Admitting: Internal Medicine

## 2017-01-20 DIAGNOSIS — L821 Other seborrheic keratosis: Secondary | ICD-10-CM | POA: Diagnosis not present

## 2017-01-20 DIAGNOSIS — D1801 Hemangioma of skin and subcutaneous tissue: Secondary | ICD-10-CM | POA: Diagnosis not present

## 2017-01-20 DIAGNOSIS — L814 Other melanin hyperpigmentation: Secondary | ICD-10-CM | POA: Diagnosis not present

## 2017-03-04 ENCOUNTER — Other Ambulatory Visit: Payer: Self-pay | Admitting: Cardiology

## 2017-03-04 NOTE — Telephone Encounter (Signed)
REFILL 

## 2017-03-09 ENCOUNTER — Encounter: Payer: Self-pay | Admitting: Oncology

## 2017-03-09 DIAGNOSIS — M810 Age-related osteoporosis without current pathological fracture: Secondary | ICD-10-CM | POA: Diagnosis not present

## 2017-03-09 DIAGNOSIS — Z1231 Encounter for screening mammogram for malignant neoplasm of breast: Secondary | ICD-10-CM | POA: Diagnosis not present

## 2017-03-09 DIAGNOSIS — Z853 Personal history of malignant neoplasm of breast: Secondary | ICD-10-CM | POA: Diagnosis not present

## 2017-03-09 LAB — HM DEXA SCAN

## 2017-03-17 ENCOUNTER — Encounter: Payer: Self-pay | Admitting: *Deleted

## 2017-03-18 ENCOUNTER — Encounter: Payer: Medicare Other | Admitting: Internal Medicine

## 2017-04-29 ENCOUNTER — Telehealth: Payer: Self-pay | Admitting: *Deleted

## 2017-04-29 NOTE — Telephone Encounter (Signed)
Patient called wanting her Sodium Level Checked. Stated that it has dropped in the past and she wants to make sure it is ok. Not having any problems but feels like it should be checked often, like every 2-3 weeks. Please Advise.

## 2017-04-30 NOTE — Telephone Encounter (Signed)
Spoke with patient and scheduled appt and schedule lab appt with bernadette

## 2017-04-30 NOTE — Telephone Encounter (Signed)
.  left message to have patient return my call.  

## 2017-04-30 NOTE — Telephone Encounter (Signed)
She missed her last appt. Dee, can you check when she comes next and if she has labs before?

## 2017-04-30 NOTE — Telephone Encounter (Signed)
Patient does not have a follow-up and no labs scheduled.

## 2017-05-04 NOTE — Progress Notes (Signed)
Cynthia Watkins Date of Birth: 06/15/1924   History of Present Illness: Cynthia Watkins is seen for follow up. She has a history of hypertension, hyperlipidemia, and a right bundle branch block. She was seen in June 2016 with complaints of palpitations and feeling faint.  Event monitor showed no arrhythmia. Echo noted below. All antihypertensive therapy was discontinued. She was seen again last year with more palpitations. Event monitor showed frequent PACs and one short run of NSVT 7 beats. She was managed with low dose beta blocker.  In January 2018 she was admitted to the hospital after a Gi illness with acute encephalopathy and sodium level down to 107. She was treated with hypertonic saline and lasix. Sodium improved to 129. She was DC on atenolol 12.5 mg daily and lasix 20 mg daily. She returned to the ED Jan 29 with symptoms of SOB and lightheadedness. Sodium improved to 133. BNP 150. CXR showed no acute change. It was felt that she had a panic attack and was hyperventilating. She was orthostatic and her lasix was discontinued.   On follow up today she states she is doing well. She is very active walking 4-6 miles/day and doing an exercise class 2x/week. No dyspnea, chest pain, palpitations, dizziness. She no longer drives.  Current Outpatient Medications on File Prior to Visit  Medication Sig Dispense Refill  . Biotin 5000 MCG CAPS Take 1 capsule by mouth daily.     . calcium carbonate (CALCIUM 600) 600 MG TABS tablet Take 600 mg by mouth daily with breakfast.    . Cholecalciferol (VITAMIN D3) 1000 units CAPS Take 2,000 Units by mouth daily.    . Melatonin 3 MG CAPS Take 1 capsule (3 mg total) by mouth at bedtime as needed (sleep). 30 capsule 0  . tamoxifen (NOLVADEX) 20 MG tablet TAKE 1 TABLET ONCE DAILY. 90 tablet PRN   No current facility-administered medications on file prior to visit.     No Known Allergies  Past Medical History:  Diagnosis Date  . Acute upper respiratory  infections of unspecified site 06/09/2011  . Breast cancer ([place]) 12/11/2008  . Fatigue 01/2012  . Hypercholesterolemia    on lipitor  . Hypertension   . Hyponatremia 01/2016  . Memory loss   . Mitral valve prolapse   . Other abnormal blood chemistry 01/21/2010  . Palpitations 08/05/2000  . Right bundle branch block   . Senile osteoporosis 09/28/2001    Past Surgical History:  Procedure Laterality Date  . BREAST LUMPECTOMY Right 12/27/2007  . LAPAROSCOPIC LYSIS OF ADHESIONS  07/15/2006   [name]  . OTHER SURGICAL HISTORY    . TONSILLECTOMY    . TUBAL LIGATION  1963    Social History   Tobacco Use  Smoking Status Former Smoker  . Packs/day: 0.25  . Years: 10.00  . Pack years: 2.50  . Last attempt to quit: 08/06/1954  . Years since quitting: 62.7  Smokeless Tobacco Never Used    Social History   Substance and Sexual Activity  Alcohol Use Yes   Comment: Occasionally     Family History  Problem Relation Age of Onset  . Stroke Mother   . Cancer Father        colon  . Alzheimer's disease Sister     Review of Systems: As the history of present illness.  All other systems were reviewed and are negative.  Physical Exam: BP 134/66   Pulse (!) 57   Ht 5\' 1"  (1.549 m)  Wt 102 lb 9.6 oz (46.5 kg)   BMI 19.39 kg/m  GENERAL:  Well appearing elderly WF in NAD HEENT:  PERRL, EOMI, sclera are clear. Oropharynx is clear. NECK:  No jugular venous distention, carotid upstroke brisk and symmetric, no bruits, no thyromegaly or adenopathy LUNGS:  Clear to auscultation bilaterally CHEST:  Unremarkable HEART:  RRR,  PMI not displaced or sustained,S1 and S2 within normal limits, no S3, no S4: no clicks, no rubs, no murmurs ABD:  Soft, nontender. BS +, no masses or bruits. No hepatomegaly, no splenomegaly EXT:  2 + pulses throughout, no edema, no cyanosis no clubbing SKIN:  Warm and dry.  No rashes NEURO:  Alert and oriented x 3. Cranial nerves II through XII intact. PSYCH:   Cognitively intact    LABORATORY DATA: Lab Results  Component Value Date   WBC 7.2 09/16/2016   HGB 12.8 09/16/2016   HCT 37.6 09/16/2016   PLT 227 09/16/2016   GLUCOSE 87 09/16/2016   CHOL 214 (H) 01/27/2016   TRIG 96 01/27/2016   HDL 62 01/27/2016   LDLCALC 133 (H) 01/27/2016   ALT 11 09/16/2016   AST 18 09/16/2016   NA 136 (A) 05/05/2017   K 4.5 05/05/2017   CL 102 02/04/2016   CREATININE 0.7 05/05/2017   BUN 18 05/05/2017   CO2 29 09/16/2016   TSH 2.10 09/09/2016   INR 1.07 01/23/2016   HGBA1C 5.9 (H) 01/23/2016   Echo 02/21/16: Study Conclusions  - Left ventricle: The cavity size was normal. Wall thickness was   normal. Features are consistent with a pseudonormal left   ventricular filling pattern, with concomitant abnormal relaxation   and increased filling pressure (grade 2 diastolic dysfunction). - Aortic valve: Mildly to moderately calcified annulus. There was   mild regurgitation. Valve area (VTI): 1.19 cm^2. Valve area   (Vmax): 1.19 cm^2. Valve area (Vmean): 1.29 cm^2. - Tricuspid valve: There was mild-moderate regurgitation. - Pulmonary arteries: Systolic pressure was mildly increased. PA   peak pressure: 36 mm Hg (S).  Ecg today shows NSR rate 57. RBBB. I have personally reviewed and interpreted this study.  Assessment / Plan: 1. Dyspnea. Resolved. No evidence of volume overload.  2. Hyponatremia. Probably related to GI illness and excessive po water intake. Now corrected.  3. Right bundle branch block, chronic.  4. History of HTN- now well controlled on minimal therapy  5. History of palpitations. Controlled on low dose atenolol.   we will continue current therapy and follow up in one year.

## 2017-05-05 ENCOUNTER — Encounter: Payer: Self-pay | Admitting: Internal Medicine

## 2017-05-05 DIAGNOSIS — D649 Anemia, unspecified: Secondary | ICD-10-CM | POA: Diagnosis not present

## 2017-05-05 DIAGNOSIS — E871 Hypo-osmolality and hyponatremia: Secondary | ICD-10-CM | POA: Diagnosis not present

## 2017-05-05 LAB — BASIC METABOLIC PANEL
BUN: 18 (ref 4–21)
Creatinine: 0.7 (ref 0.5–1.1)
Glucose: 107
Potassium: 4.5 (ref 3.4–5.3)
Sodium: 136 — AB (ref 137–147)

## 2017-05-05 NOTE — Telephone Encounter (Signed)
Spoke with patient and advised that sodium is normal at 136. She understood and didn't have any questions.

## 2017-05-06 ENCOUNTER — Encounter: Payer: Self-pay | Admitting: Cardiology

## 2017-05-06 ENCOUNTER — Ambulatory Visit: Payer: Medicare Other | Admitting: Cardiology

## 2017-05-06 VITALS — BP 134/66 | HR 57 | Ht 61.0 in | Wt 102.6 lb

## 2017-05-06 DIAGNOSIS — E871 Hypo-osmolality and hyponatremia: Secondary | ICD-10-CM

## 2017-05-06 DIAGNOSIS — R0602 Shortness of breath: Secondary | ICD-10-CM | POA: Diagnosis not present

## 2017-05-06 DIAGNOSIS — I10 Essential (primary) hypertension: Secondary | ICD-10-CM

## 2017-05-06 DIAGNOSIS — I4510 Unspecified right bundle-branch block: Secondary | ICD-10-CM | POA: Diagnosis not present

## 2017-05-06 DIAGNOSIS — Z961 Presence of intraocular lens: Secondary | ICD-10-CM | POA: Diagnosis not present

## 2017-05-06 MED ORDER — ATENOLOL 25 MG PO TABS: 12.5000 mg | ORAL_TABLET | Freq: Every day | ORAL | 3 refills | Status: DC

## 2017-05-06 NOTE — Addendum Note (Signed)
Addended by: Kathyrn Lass on: 05/06/2017 10:56 AM   Modules accepted: Orders

## 2017-05-06 NOTE — Patient Instructions (Signed)
Continue your current therapy  I will see you in one year   

## 2017-05-13 ENCOUNTER — Encounter: Payer: Self-pay | Admitting: Internal Medicine

## 2017-05-13 ENCOUNTER — Non-Acute Institutional Stay: Payer: Medicare Other | Admitting: Internal Medicine

## 2017-05-13 VITALS — BP 128/58 | HR 58 | Temp 98.0°F | Ht 61.0 in | Wt 103.0 lb

## 2017-05-13 DIAGNOSIS — I10 Essential (primary) hypertension: Secondary | ICD-10-CM | POA: Diagnosis not present

## 2017-05-13 DIAGNOSIS — Z23 Encounter for immunization: Secondary | ICD-10-CM | POA: Diagnosis not present

## 2017-05-13 DIAGNOSIS — E871 Hypo-osmolality and hyponatremia: Secondary | ICD-10-CM | POA: Diagnosis not present

## 2017-05-13 DIAGNOSIS — M810 Age-related osteoporosis without current pathological fracture: Secondary | ICD-10-CM | POA: Diagnosis not present

## 2017-05-13 MED ORDER — TETANUS-DIPHTH-ACELL PERTUSSIS 5-2-15.5 LF-MCG/0.5 IM SUSP: 0.5000 mL | Freq: Once | INTRAMUSCULAR | 0 refills | Status: AC

## 2017-05-13 NOTE — Progress Notes (Signed)
Location:  Occupational psychologist of Service:  Clinic (12)  Provider: Kaysee Hergert L. Mariea Clonts, D.O., C.M.D.  Code Status: DNR Goals of Care:  Advanced Directives 05/13/2017  Does Patient Have a Medical Advance Directive? Yes  Type of Advance Directive [place]  Does patient want to make changes to medical advance directive? No - Patient declined  Copy of [place] in Chart? Yes  Would patient like information on creating a medical advance directive? -  Pre-existing out of facility DNR order (yellow form or pink MOST form) -     Chief Complaint  Patient presents with  . Medical Management of Chronic Issues    follow-up, discuss sodium    HPI: Patient is a 84 y.o. female seen today for medical management of chronic diseases.    Na 136 on 05/05/17.   Requests regular checks since she felt fine until just before she couldn't speak when her sodium was low in the past.    Feels fine.  No concerns.    Admits her memory is not as good, but she tries to write things down to keep track which seems to work well.    She is getting mammograms every 2 years due to her history of breast cancer.  Last was last year so not due.  Same for bone density.  She has known osteoporosis, takes vitamin D and walks regularly.  She has stopped her calcium and biotin.   She did not c/o flatus today like she reported last visit.    She is due for tdap.    BP well controlled with current regimen, HR also ok at 58 on atenolol.  No dizziness or lightheadedness.  No falls.   Past Medical History:  Diagnosis Date  . Acute upper respiratory infections of unspecified site 06/09/2011  . Breast cancer ([place]) 12/11/2008  . Fatigue 01/2012  . Hypercholesterolemia    on lipitor  . Hypertension   . Hyponatremia 01/2016  . Memory loss   . Mitral valve prolapse   . Other abnormal blood chemistry 01/21/2010  . Palpitations 08/05/2000  . Right bundle branch block   .  Senile osteoporosis 09/28/2001    Past Surgical History:  Procedure Laterality Date  . BREAST LUMPECTOMY Right 12/27/2007  . LAPAROSCOPIC LYSIS OF ADHESIONS  07/15/2006   [name]  . OTHER SURGICAL HISTORY    . TONSILLECTOMY    . TUBAL LIGATION  1963    No Known Allergies  Outpatient Encounter Medications as of 05/13/2017  Medication Sig  . atenolol (TENORMIN) 25 MG tablet Take 0.5 tablets (12.5 mg total) by mouth daily. NEED OV.  Marland Kitchen Cholecalciferol (VITAMIN D3) 1000 units CAPS Take 2,000 Units by mouth daily.  . Melatonin 3 MG CAPS Take 1 capsule (3 mg total) by mouth at bedtime as needed (sleep).  . tamoxifen (NOLVADEX) 20 MG tablet TAKE 1 TABLET ONCE DAILY.  . [DISCONTINUED] Biotin 5000 MCG CAPS Take 1 capsule by mouth daily.   . [DISCONTINUED] calcium carbonate (CALCIUM 600) 600 MG TABS tablet Take 600 mg by mouth daily with breakfast.   No facility-administered encounter medications on file as of 05/13/2017.     Review of Systems:  Review of Systems  Constitutional: Negative for chills, fever and weight loss.       Weight stable  HENT: Negative for congestion.   Eyes: Negative for blurred vision.  Respiratory: Negative for shortness of breath.   Cardiovascular: Negative for chest  pain, palpitations and leg swelling.  Gastrointestinal: Negative for abdominal pain, blood in stool, constipation and melena.  Genitourinary: Negative for dysuria.  Musculoskeletal: Negative for falls and joint pain.  Skin: Negative for itching and rash.  Neurological: Negative for dizziness and loss of consciousness.  Endo/Heme/Allergies: Does not bruise/bleed easily.  Psychiatric/Behavioral: Positive for memory loss. Negative for depression. The patient is not nervous/anxious and does not have insomnia.     Health Maintenance  Topic Date Due  . TETANUS/TDAP  05/16/1943  . MAMMOGRAM  03/06/2017  . INFLUENZA VACCINE  08/06/2017  . DEXA SCAN  Completed  . PNA vac Low Risk Adult  Completed     Physical Exam: Vitals:   05/13/17 1525  BP: (!) 128/58  Pulse: (!) 58  Temp: 98 F (36.7 C)  TempSrc: Oral  SpO2: 96%  Weight: 103 lb (46.7 kg)  Height: 5\' 1"  (1.549 m)   Body mass index is 19.46 kg/m. Physical Exam  Constitutional: She is oriented to person, place, and time. She appears well-developed. No distress.  Thin female, walks w/o assistive device  HENT:  Head: Normocephalic and atraumatic.  Eyes:  glasses  Cardiovascular: Normal rate, regular rhythm, normal heart sounds and intact distal pulses.  Pulmonary/Chest: Effort normal and breath sounds normal. No respiratory distress.  Abdominal: Bowel sounds are normal.  Musculoskeletal: Normal range of motion.  Stooped posture  Neurological: She is alert and oriented to person, place, and time.  Skin: Skin is warm and dry. Capillary refill takes less than 2 seconds. There is pallor.  Psychiatric: She has a normal mood and affect.  Appears slightly anxious today, a bit fidgety    Labs reviewed: Basic Metabolic Panel: Recent Labs    09/09/16 09/16/16 1435 12/09/16 05/05/17 0600  NA  --  132* 138 136*  K  --  4.8 4.4 4.5  CO2  --  29  --   --   GLUCOSE  --  87  --   --   BUN  --  21.5 18 18   CREATININE  --  0.8 0.6 0.7  CALCIUM  --  9.5  --   --   TSH 2.10  --   --   --    Liver Function Tests: Recent Labs    09/16/16 1435  AST 18  ALT 11  ALKPHOS 45  BILITOT 0.30  PROT 6.5  ALBUMIN 3.7   No results for input(s): LIPASE, AMYLASE in the last 8760 hours. No results for input(s): AMMONIA in the last 8760 hours. CBC: Recent Labs    09/16/16 1435  WBC 7.2  NEUTROABS 4.2  HGB 12.8  HCT 37.6  MCV 99.7  PLT 227   Lipid Panel: No results for input(s): CHOL, HDL, LDLCALC, TRIG, CHOLHDL, LDLDIRECT in the last 8760 hours. Lab Results  Component Value Date   HGBA1C 5.9 (H) 01/23/2016    Assessment/Plan 1. Hyponatremia -Na has been stable ever since her rehab stay when she was severely  hyponatremic with aphasia  2. Essential hypertension, benign -bp at goal with current atenolol and w/o dizziness or lightheadedness, no falls, is aware her heart rate is blunted  3. Senile osteoporosis -cont vitamin D supplement and weightbearing exercise, bone density q 2 years, she has not wanted other medication for her bones  4. Need for tetanus, diphtheria, and acellular pertussis (Tdap) vaccine - Tdap (ADACEL) 05-07-13.5 LF-MCG/0.5 injection; Inject 0.5 mLs into the muscle once for 1 dose.  Dispense: 0.5 mL; Refill: 0 -[facility] [address] [facility] injection [facility] so medicare will cover it  Labs/tests ordered:  Bmp in 3 mos Next appt:  09/16/2017   Hurshell Dino L. Sheyanne Munley, D.O. [facility] [address], [place] [address] Cell Phone (Mon-Fri 8am-5pm):  [phone] On Call:  [phone] & follow prompts after 5pm & weekends Office Phone:  [phone] Office Fax:  [phone]

## 2017-07-21 DIAGNOSIS — L814 Other melanin hyperpigmentation: Secondary | ICD-10-CM | POA: Diagnosis not present

## 2017-07-21 DIAGNOSIS — D1801 Hemangioma of skin and subcutaneous tissue: Secondary | ICD-10-CM | POA: Diagnosis not present

## 2017-08-14 ENCOUNTER — Telehealth: Payer: Self-pay | Admitting: Internal Medicine

## 2017-08-14 NOTE — Telephone Encounter (Signed)
Scheduled for 08/25/17

## 2017-08-14 NOTE — Telephone Encounter (Signed)
I left a message asking the patient to call me at [phone] to schedule AWV with Sara at [facility] on afternoon of 08/25/17 if available. VDM (DD) °

## 2017-08-18 DIAGNOSIS — E871 Hypo-osmolality and hyponatremia: Secondary | ICD-10-CM | POA: Diagnosis not present

## 2017-08-18 DIAGNOSIS — D649 Anemia, unspecified: Secondary | ICD-10-CM | POA: Diagnosis not present

## 2017-08-18 LAB — BASIC METABOLIC PANEL
BUN: 19 (ref 4–21)
Creatinine: 0.7 (ref 0.5–1.1)
Glucose: 107
Potassium: 4.4 (ref 3.4–5.3)
Sodium: 138 (ref 137–147)

## 2017-08-21 ENCOUNTER — Encounter: Payer: Self-pay | Admitting: Internal Medicine

## 2017-08-24 ENCOUNTER — Telehealth: Payer: Self-pay | Admitting: Internal Medicine

## 2017-08-24 NOTE — Telephone Encounter (Signed)
Voicemail not set up on mobile number. Left msg on home number asking pt to confirm this AWV-S that was rescheduled from 08/25/17. Last AWV 08/06/16. VDM (DD)

## 2017-08-25 NOTE — Telephone Encounter (Signed)
Pt called back and confirmed 9/10 appt.

## 2017-08-25 NOTE — Telephone Encounter (Signed)
2nd attempt to reach patient on both numbers to ask if she received my message about her appointment being rescheduled. Left msg on home number. VDM (DD)

## 2017-09-15 ENCOUNTER — Telehealth: Payer: Self-pay | Admitting: Oncology

## 2017-09-15 ENCOUNTER — Non-Acute Institutional Stay: Payer: Medicare Other

## 2017-09-15 VITALS — BP 116/62 | HR 74 | Temp 98.5°F | Ht 61.0 in | Wt 103.0 lb

## 2017-09-15 DIAGNOSIS — Z0000 Encounter for general adult medical examination without abnormal findings: Secondary | ICD-10-CM

## 2017-09-15 MED ORDER — TETANUS-DIPHTH-ACELL PERTUSSIS 5-2.5-18.5 LF-MCG/0.5 IM SUSP: 0.5000 mL | Freq: Once | INTRAMUSCULAR | 0 refills | Status: AC

## 2017-09-15 NOTE — Progress Notes (Signed)
Subjective:   Cynthia Watkins is a 84 y.o. female who presents for Medicare Annual (Subsequent) preventive examination at [facility]  Last AWV-08/06/2016    Objective:     Vitals: There were no vitals taken for this visit.  There is no height or weight on file to calculate BMI.  Advanced Directives 09/15/2017 05/13/2017 12/24/2016 08/27/2016 08/06/2016 05/21/2016 02/20/2016  Does Patient Have a Medical Advance Directive? Yes Yes Yes Yes Yes Yes Yes  Type of [place] administrator Power of [place] of [place];Living will [place] of facility DNR (pink MOST or yellow form);Healthcare Power of Attorney  Does patient want to make changes to medical advance directive? No - Patient declined No - Patient declined No - Patient declined - No - Patient declined - -  Copy of [place] in Chart? Yes Yes Yes Yes Yes Yes -  Would patient like information on creating a medical advance directive? - - - - - - -  Pre-existing out of facility DNR order (yellow form or pink MOST form) - - - - - - Yellow form placed in chart (order not valid for inpatient use)    Tobacco Social History   Tobacco Use  Smoking Status Former Smoker  . Packs/day: 0.25  . Years: 10.00  . Pack years: 2.50  . Last attempt to quit: 08/06/1954  . Years since quitting: 63.1  Smokeless Tobacco Never Used     Counseling given: Not Answered   Clinical Intake:  Pre-visit preparation completed: No  Pain : No/denies pain     Nutritional Risks: None Diabetes: No  How often do you need to have someone help you when you read instructions, pamphlets, or other written materials from your doctor or pharmacy?: 1 - Never What is the last grade level you completed in school?: PHD  Interpreter Needed?: No  Information entered by :: Tyson Dense, RN  Past  Medical History:  Diagnosis Date  . Acute upper respiratory infections of unspecified site 06/09/2011  . Breast cancer ([place]) 12/11/2008  . Fatigue 01/2012  . Hypercholesterolemia    on lipitor  . Hypertension   . Hyponatremia 01/2016  . Memory loss   . Mitral valve prolapse   . Other abnormal blood chemistry 01/21/2010  . Palpitations 08/05/2000  . Right bundle branch block   . Senile osteoporosis 09/28/2001   Past Surgical History:  Procedure Laterality Date  . BREAST LUMPECTOMY Right 12/27/2007  . LAPAROSCOPIC LYSIS OF ADHESIONS  07/15/2006   [name]  . OTHER SURGICAL HISTORY    . TONSILLECTOMY    . TUBAL LIGATION  1963   Family History  Problem Relation Age of Onset  . Stroke Mother   . Cancer Father        colon  . Alzheimer's disease Sister    Social History   Socioeconomic History  . Marital status: Widowed    Spouse name: Not on file  . Number of children: 3  . Years of education: Not on file  . Highest education level: Not on file  Occupational History  . Occupation: Retired Product manager: RETIRED  Social Needs  . Financial resource strain: Not hard at all  . Food insecurity:    Worry: Never true    Inability: Never true  . Transportation needs:    Medical: No    Non-medical: No  Tobacco Use  . Smoking status: Former Smoker    Packs/day: 0.25    Years: 10.00    Pack years: 2.50    Last attempt to quit: 08/06/1954    Years since quitting: 63.1  . Smokeless tobacco: Never Used  Substance and Sexual Activity  . Alcohol use: Yes    Comment: Occasionally   . Drug use: No  . Sexual activity: Never  Lifestyle  . Physical activity:    Days per week: 7 days    Minutes per session: 120 min  . Stress: Not at all  Relationships  . Social connections:    Talks on phone: More than three times a week    Gets together: More than three times a week    Attends religious service: Never    Active member of club or organization: No    Attends meetings of  clubs or organizations: Never    Relationship status: Widowed  Other Topics Concern  . Not on file  Social History Narrative   Lives at [facility] since 05/1998   Widowed   Living will   Former smoker, stopped 1956   Alcohol  Rare   Exercise - walk one hour daily 7 days a week,  3 days of stretching and strengthen, yoga 45 minutes 2 days a week    Outpatient Encounter Medications as of 09/15/2017  Medication Sig  . atenolol (TENORMIN) 25 MG tablet Take 0.5 tablets (12.5 mg total) by mouth daily. NEED OV.  Marland Kitchen Cholecalciferol (VITAMIN D3) 1000 units CAPS Take 2,000 Units by mouth daily.  . Melatonin 3 MG CAPS Take 1 capsule (3 mg total) by mouth at bedtime as needed (sleep).  . tamoxifen (NOLVADEX) 20 MG tablet TAKE 1 TABLET ONCE DAILY.   No facility-administered encounter medications on file as of 09/15/2017.     Activities of Daily Living In your present state of health, do you have any difficulty performing the following activities: 09/15/2017  Hearing? Y  Vision? N  Difficulty concentrating or making decisions? N  Walking or climbing stairs? N  Dressing or bathing? N  Doing errands, shopping? N  Preparing Food and eating ? N  Using the Toilet? N  In the past six months, have you accidently leaked urine? N  Do you have problems with loss of bowel control? N  Managing your Medications? N  Managing your Finances? N  Housekeeping or managing your Housekeeping? N  Some recent data might be hidden    Patient Care Team: Gayland Curry, DO as PCP - General (Geriatric Medicine) Prentiss Bells, MD as Consulting Physician (Ophthalmology) [place], Peter M, MD as Consulting Physician (Cardiology) Allyn Kenner, MD as Consulting Physician (Dermatology) Michael Boston, MD as Consulting Physician (General Surgery) [facility], MD (Inactive) as Consulting Physician (Hematology and Oncology) Magrinat, Virgie Dad, MD as Consulting Physician (Oncology) [name], MD as Consulting Physician (Ophthalmology)    Assessment:   This is a routine wellness examination for Cynthia Watkins.  Exercise Activities and Dietary recommendations Current Exercise Habits: Home exercise routine, Type of exercise: walking, Time (Minutes): > 60, Frequency (Times/Week): 7, Weekly Exercise (Minutes/Week): 0, Intensity: Mild, Exercise limited by: None identified  Goals    . increase water     Patient will increase water and herbal tea intake       Fall Risk Fall Risk  09/15/2017 09/15/2017 05/13/2017 08/27/2016 08/06/2016  Falls in the past year? No No No No No   Is the patient's  home free of loose throw rugs in walkways, pet beds, electrical cords, etc?   yes      Grab bars in the bathroom? yes      Handrails on the stairs?   yes      Adequate lighting?   yes  Depression Screen PHQ 2/9 Scores 09/15/2017 09/15/2017 05/13/2017 08/27/2016  PHQ - 2 Score 0 0 0 0     Cognitive Function MMSE - Mini Mental State Exam 08/06/2016 08/15/2015 02/14/2014  Orientation to time 5 4 5   Orientation to Place 5 5 5   Registration 3 3 3   Attention/ Calculation 5 5 5   Recall 3 3 3   Language- name 2 objects 2 2 2   Language- repeat 1 1 1   Language- follow 3 step command 3 3 3   Language- read & follow direction 1 1 1   Write a sentence 1 1 1   Copy design 1 1 1   Total score 30 29 30         Immunization History  Administered Date(s) Administered  . Influenza Split 10/23/2011  . Influenza, High Dose Seasonal PF 09/20/2015, 10/09/2016  . Influenza,inj,Quad PF,6+ Mos 10/28/2013  . Influenza-Unspecified 10/06/2012, 10/26/2014  . Pneumococcal Conjugate-13 02/14/2014  . Pneumococcal Polysaccharide-23 01/06/1997  . Zoster 01/07/1999  . Zoster Recombinat (Shingrix) 10/09/2016    Qualifies for Shingles Vaccine? Up to date, completed. Patient is going to ask daughter where/when she got the second shot  Screening Tests Health Maintenance  Topic Date Due  . Samul Dada  05/16/1943  .  MAMMOGRAM  03/06/2017  . INFLUENZA VACCINE  08/06/2017  . DEXA SCAN  Completed  . PNA vac Low Risk Adult  Completed    Cancer Screenings: Lung: Low Dose CT Chest recommended if Age 69-80 years, 30 pack-year currently smoking OR have quit w/in 15years. Patient does not qualify. Breast:  Up to date on Mammogram? Yes   Up to date of Bone Density/Dexa? Yes Colorectal: up to date  Additional Screenings: Hepatitis C Screening: declined Flu vaccine due: will receive at [facility] Tdap due: prescription sent to pharmacy     Plan:    I have personally reviewed and addressed the Medicare Annual Wellness questionnaire and have noted the following in the patient's chart:  A. Medical and social history B. Use of alcohol, tobacco or illicit drugs  C. Current medications and supplements D. Functional ability and status E.  Nutritional status F.  Physical activity G. Advance directives H. List of other physicians I.  Hospitalizations, surgeries, and ER visits in previous 12 months J.  [place] to include hearing, vision, cognitive, depression L. Referrals and appointments - none  In addition, I have reviewed and discussed with patient certain preventive protocols, quality metrics, and best practice recommendations. A written personalized care plan for preventive services as well as general preventive health recommendations were provided to patient.  See attached scanned questionnaire for additional information.   Signed,   Tyson Dense, RN Nurse Health Advisor  Patient Concerns: None

## 2017-09-15 NOTE — Telephone Encounter (Signed)
GM PAL - moved 9/18 lab/fu to 9/30 w/LC per GM. Left message for patient. Schedule mailed.

## 2017-09-15 NOTE — Patient Instructions (Signed)
[name] , Thank you for taking time to come for your Medicare Wellness Visit. I appreciate your ongoing commitment to your health goals. Please review the following plan we discussed and let me know if I can assist you in the future.   Screening recommendations/referrals: Colonoscopy excluded, over age 82 Mammogram excluded, over age 45 Bone Density up to date Recommended yearly ophthalmology/optometry visit for glaucoma screening and checkup Recommended yearly dental visit for hygiene and checkup  Vaccinations: Influenza vaccine due, please get at [facility] once they get it in stock Pneumococcal vaccine up to date, completed Tdap vaccine due, prescription [facility] [address] Shingles vaccine - please ask daughter which pharmacy you got the Shingrix at on 10/09/2016 and find out if you got one or 2 Shingrix shots    Advanced directives: In chart  Conditions/risks identified: none  Next appointment: Dr. Mariea Watkins 09/16/2017 @ 2pm   Preventive Care 65 Years and Older, Female Preventive care refers to lifestyle choices and visits with your health care provider that can promote health and wellness. What does preventive care include?  A yearly physical exam. This is also called an annual well check.  Dental exams once or twice a year.  Routine eye exams. Ask your health care provider how often you should have your eyes checked.  Personal lifestyle choices, including:  Daily care of your teeth and gums.  Regular physical activity.  Eating a healthy diet.  Avoiding tobacco and drug use.  Limiting alcohol use.  Practicing safe sex.  Taking low-dose aspirin every day.  Taking vitamin and mineral supplements as recommended by your health care provider. What happens during an annual well check? The services and screenings done by your health care provider during your annual well check will depend on your age, overall health, lifestyle risk factors, and family history of  disease. Counseling  Your health care provider may ask you questions about your:  Alcohol use.  Tobacco use.  Drug use.  Emotional well-being.  Home and relationship well-being.  Sexual activity.  Eating habits.  History of falls.  Memory and ability to understand (cognition).  Work and work Statistician.  Reproductive health. Screening  You may have the following tests or measurements:  Height, weight, and BMI.  Blood pressure.  Lipid and cholesterol levels. These may be checked every 5 years, or more frequently if you are over 21 years old.  Skin check.  Lung cancer screening. You may have this screening every year starting at age 14 if you have a 30-pack-year history of smoking and currently smoke or have quit within the past 15 years.  Fecal occult blood test (FOBT) of the stool. You may have this test every year starting at age 31.  Flexible sigmoidoscopy or colonoscopy. You may have a sigmoidoscopy every 5 years or a colonoscopy every 10 years starting at age 62.  Hepatitis C blood test.  Hepatitis B blood test.  Sexually transmitted disease (STD) testing.  Diabetes screening. This is done by checking your blood sugar (glucose) after you have not eaten for a while (fasting). You may have this done every 1-3 years.  Bone density scan. This is done to screen for osteoporosis. You may have this done starting at age 34.  Mammogram. This may be done every 1-2 years. Talk to your health care provider about how often you should have regular mammograms. Talk with your health care provider about your test results, treatment options, and if necessary, the need for more tests.  Vaccines  Your health care provider may recommend certain vaccines, such as:  Influenza vaccine. This is recommended every year.  Tetanus, diphtheria, and acellular pertussis (Tdap, Td) vaccine. You may need a Td booster every 10 years.  Zoster vaccine. You may need this after age  35.  Pneumococcal 13-valent conjugate (PCV13) vaccine. One dose is recommended after age 45.  Pneumococcal polysaccharide (PPSV23) vaccine. One dose is recommended after age 34. Talk to your health care provider about which screenings and vaccines you need and how often you need them. This information is not intended to replace advice given to you by your health care provider. Make sure you discuss any questions you have with your health care provider. Document Released: 01/19/2015 Document Revised: 09/12/2015 Document Reviewed: 10/24/2014 Elsevier Interactive Patient Education  2017 [place] Prevention in the Home Falls can cause injuries. They can happen to people of all ages. There are many things you can do to make your home safe and to help prevent falls. What can I do on the outside of my home?  Regularly fix the edges of walkways and driveways and fix any cracks.  Remove anything that might make you trip as you walk through a door, such as a raised step or threshold.  Trim any bushes or trees on the path to your home.  Use bright outdoor lighting.  Clear any walking paths of anything that might make someone trip, such as rocks or tools.  Regularly check to see if handrails are loose or broken. Make sure that both sides of any steps have handrails.  Any raised decks and porches should have guardrails on the edges.  Have any leaves, snow, or ice cleared regularly.  Use sand or salt on walking paths during winter.  Clean up any spills in your garage right away. This includes oil or grease spills. What can I do in the bathroom?  Use night lights.  Install grab bars by the toilet and in the tub and shower. Do not use towel bars as grab bars.  Use non-skid mats or decals in the tub or shower.  If you need to sit down in the shower, use a plastic, non-slip stool.  Keep the floor dry. Clean up any water that spills on the floor as soon as it happens.  Remove  soap buildup in the tub or shower regularly.  Attach bath mats securely with double-sided non-slip rug tape.  Do not have throw rugs and other things on the floor that can make you trip. What can I do in the bedroom?  Use night lights.  Make sure that you have a light by your bed that is easy to reach.  Do not use any sheets or blankets that are too big for your bed. They should not hang down onto the floor.  Have a firm chair that has side arms. You can use this for support while you get dressed.  Do not have throw rugs and other things on the floor that can make you trip. What can I do in the kitchen?  Clean up any spills right away.  Avoid walking on wet floors.  Keep items that you use a lot in easy-to-reach places.  If you need to reach something above you, use a strong step stool that has a grab bar.  Keep electrical cords out of the way.  Do not use floor polish or wax that makes floors slippery. If you must use wax, use non-skid floor wax.  Do not have throw rugs and other things on the floor that can make you trip. What can I do with my stairs?  Do not leave any items on the stairs.  Make sure that there are handrails on both sides of the stairs and use them. Fix handrails that are broken or loose. Make sure that handrails are as long as the stairways.  Check any carpeting to make sure that it is firmly attached to the stairs. Fix any carpet that is loose or worn.  Avoid having throw rugs at the top or bottom of the stairs. If you do have throw rugs, attach them to the floor with carpet tape.  Make sure that you have a light switch at the top of the stairs and the bottom of the stairs. If you do not have them, ask someone to add them for you. What else can I do to help prevent falls?  Wear shoes that:  Do not have high heels.  Have rubber bottoms.  Are comfortable and fit you well.  Are closed at the toe. Do not wear sandals.  If you use a  stepladder:  Make sure that it is fully opened. Do not climb a closed stepladder.  Make sure that both sides of the stepladder are locked into place.  Ask someone to hold it for you, if possible.  Clearly mark and make sure that you can see:  Any grab bars or handrails.  First and last steps.  Where the edge of each step is.  Use tools that help you move around (mobility aids) if they are needed. These include:  Canes.  Walkers.  Scooters.  Crutches.  Turn on the lights when you go into a dark area. Replace any light bulbs as soon as they burn out.  Set up your furniture so you have a clear path. Avoid moving your furniture around.  If any of your floors are uneven, fix them.  If there are any pets around you, be aware of where they are.  Review your medicines with your doctor. Some medicines can make you feel dizzy. This can increase your chance of falling. Ask your doctor what other things that you can do to help prevent falls. This information is not intended to replace advice given to you by your health care provider. Make sure you discuss any questions you have with your health care provider. Document Released: 10/19/2008 Document Revised: 05/31/2015 Document Reviewed: 01/27/2014 Elsevier Interactive Patient Education  2017 [employer].

## 2017-09-16 ENCOUNTER — Telehealth: Payer: Self-pay | Admitting: Adult Health

## 2017-09-16 ENCOUNTER — Non-Acute Institutional Stay: Payer: Medicare Other | Admitting: Internal Medicine

## 2017-09-16 ENCOUNTER — Encounter: Payer: Self-pay | Admitting: Internal Medicine

## 2017-09-16 VITALS — BP 110/68 | HR 74 | Temp 98.4°F | Ht 61.0 in | Wt 103.0 lb

## 2017-09-16 DIAGNOSIS — I10 Essential (primary) hypertension: Secondary | ICD-10-CM | POA: Diagnosis not present

## 2017-09-16 DIAGNOSIS — E871 Hypo-osmolality and hyponatremia: Secondary | ICD-10-CM

## 2017-09-16 DIAGNOSIS — R351 Nocturia: Secondary | ICD-10-CM | POA: Diagnosis not present

## 2017-09-16 DIAGNOSIS — M810 Age-related osteoporosis without current pathological fracture: Secondary | ICD-10-CM | POA: Diagnosis not present

## 2017-09-16 DIAGNOSIS — N3281 Overactive bladder: Secondary | ICD-10-CM

## 2017-09-16 DIAGNOSIS — E559 Vitamin D deficiency, unspecified: Secondary | ICD-10-CM

## 2017-09-16 DIAGNOSIS — R739 Hyperglycemia, unspecified: Secondary | ICD-10-CM

## 2017-09-16 NOTE — Progress Notes (Signed)
Location:  [facility] clinic Provider:  Jarret Torre L. Mariea Clonts, D.O., C.M.D.  Code Status: DNR Goals of Care:  Advanced Directives 09/15/2017  Does Patient Have a Medical Advance Directive? Yes  Type of Advance Directive [place]  Does patient want to make changes to medical advance directive? No - Patient declined  Copy of [place] in Chart? Yes  Would patient like information on creating a medical advance directive? -  Pre-existing out of facility DNR order (yellow form or pink MOST form) -   Chief Complaint  Patient presents with  . Medical Management of Chronic Issues    15mth follow-up    HPI: Patient is a 84 y.o. female seen today for medical management of chronic diseases.    Dr. Jeffie Pollock has been in touch with me over the past few months re: his mom's short term memory loss--he refers to it as "spotty at best".  We had rechecked her Na level due to concerns about this and it was 138 on 8/13.  [place]  She admits her memory is not as good.  She does not feel like her memory interferes in her day to day routine.  MMSE 30/30.  Has always struggled with remembering names.    No pain.    She continues on her tamoxifen to prevent recurrence of breast cancer.  Takes vitamiin D3 for her bones.  Asks about 2000 instead of 1000.  I recommend 2000 units total daily.  BP at goal.    Does not sleep too well. Gets up to urinate every 2 hours.  Has been that way a long time. She has to go every 2-3 hrs in the day as well.  She makes it in time.  No leaking with coughing, sneezing.  She actually goes right to sleep with the melatonin.  Feels like she's still well rested in the morning.    Still walks 4-5 miles a day!    Past Medical History:  Diagnosis Date  . Acute upper respiratory infections of unspecified site 06/09/2011  . Breast cancer ([place]) 12/11/2008  . Fatigue 01/2012  . Hypercholesterolemia    on lipitor  . Hypertension   .  Hyponatremia 01/2016  . Memory loss   . Mitral valve prolapse   . Other abnormal blood chemistry 01/21/2010  . Palpitations 08/05/2000  . Right bundle branch block   . Senile osteoporosis 09/28/2001    Past Surgical History:  Procedure Laterality Date  . BREAST LUMPECTOMY Right 12/27/2007  . LAPAROSCOPIC LYSIS OF ADHESIONS  07/15/2006   [name]  . OTHER SURGICAL HISTORY    . TONSILLECTOMY    . TUBAL LIGATION  1963    No Known Allergies  Outpatient Encounter Medications as of 09/16/2017  Medication Sig  . atenolol (TENORMIN) 25 MG tablet Take 0.125 mg by mouth daily.  . Cholecalciferol (VITAMIN D3) 1000 units CAPS Take 2,000 Units by mouth daily.  . Melatonin 3 MG CAPS Take 1 capsule (3 mg total) by mouth at bedtime as needed (sleep).  . tamoxifen (NOLVADEX) 20 MG tablet TAKE 1 TABLET ONCE DAILY.  . [DISCONTINUED] atenolol (TENORMIN) 25 MG tablet Take 0.5 tablets (12.5 mg total) by mouth daily. NEED OV. (Patient taking differently: Take 12.5 mg by mouth daily. )   No facility-administered encounter medications on file as of 09/16/2017.     Review of Systems:  Review of Systems  Constitutional: Negative for chills and fever.  HENT: Negative for congestion and hearing loss.   Eyes: Negative for blurred vision.       Glasses  Respiratory: Negative for cough and shortness of breath.   Cardiovascular: Negative for chest pain, palpitations and leg swelling.  Gastrointestinal: Negative for abdominal pain, blood in stool, constipation, diarrhea and melena.  Genitourinary: Positive for frequency and urgency. Negative for dysuria, flank pain and hematuria.  Musculoskeletal: Negative for falls.  Neurological: Negative for dizziness, loss of consciousness and weakness.  Endo/Heme/Allergies: Does not bruise/bleed easily.  Psychiatric/Behavioral: Positive for memory loss. Negative for depression. The patient is not nervous/anxious and does not have insomnia.        Does not sleep well due  to wakening to urinate    Health Maintenance  Topic Date Due  . TETANUS/TDAP  05/16/1943  . MAMMOGRAM  03/06/2017  . INFLUENZA VACCINE  08/06/2017  . DEXA SCAN  Completed  . PNA vac Low Risk Adult  Completed    Physical Exam: Vitals:   09/16/17 1405  BP: 110/68  Pulse: 74  Temp: 98.4 F (36.9 C)  TempSrc: Oral  SpO2: 97%  Weight: 103 lb (46.7 kg)  Height: 5\' 1"  (1.549 m)   Body mass index is 19.46 kg/m. Physical Exam  Constitutional: She is oriented to person, place, and time. No distress.  HENT:  Head: Normocephalic and atraumatic.  Eyes:  glasses  Cardiovascular: Normal rate, regular rhythm, normal heart sounds and intact distal pulses.  Pulmonary/Chest: Effort normal and breath sounds normal.  Abdominal: Soft. Bowel sounds are normal. She exhibits no distension. There is no tenderness.  Musculoskeletal: Normal range of motion.  Kyphosis progressing  Neurological: She is alert and oriented to person, place, and time.  Skin: Skin is warm and dry. Capillary refill takes less than 2 seconds.  Psychiatric: She has a normal mood and affect.    Labs reviewed: Basic Metabolic Panel: Recent Labs    09/16/16 1435 12/09/16 05/05/17 08/18/17 0600  NA 132* 138 136* 138  K 4.8 4.4 4.5 4.4  CO2 29  --   --   --   GLUCOSE 87  --   --   --   BUN 21.5 18 18 19   CREATININE 0.8 0.6 0.7 0.7  CALCIUM 9.5  --   --   --    Liver Function Tests: Recent Labs    09/16/16 1435  AST 18  ALT 11  ALKPHOS 45  BILITOT 0.30  PROT 6.5  ALBUMIN 3.7   No results for input(s): LIPASE, AMYLASE in the last 8760 hours. No results for input(s): AMMONIA in the last 8760 hours. CBC: Recent Labs    09/16/16 1435  WBC 7.2  NEUTROABS 4.2  HGB 12.8  HCT 37.6  MCV 99.7  PLT 227   Lipid Panel: No results for input(s): CHOL, HDL, LDLCALC, TRIG, CHOLHDL, LDLDIRECT in the last 8760 hours. Lab Results  Component Value Date   HGBA1C 5.9 (H) 01/23/2016   Assessment/Plan 1.  Overactive bladder -seems this is new, but she reports it's been going on  2. Nocturia -urinating every 2-3 hrs--would suggest myrbetriq to see if she can get up less and rest better (w/o as many anticholinergic effects) if her son (who is a urologist) agrees or would like her evaluated more thoroughly first -denies s/s of UTI  3. Hyponatremia -Na 138 this last check last month, cont to monitor each visit   4. Essential hypertension, benign -bp excellent, no changes needed  5. Senile osteoporosis -  on vitamin D only, pt had not wanted medication for this, kyphosis is worsening over time  6. Vitamin D deficiency -cont vitamin D, but she is agreeable to 2000 units as I've suggested rather than 1000 units   7. Hyperglycemia -noted on labs in the past, f/u bmp next visit  Labs/tests ordered: cbc bmp before Next appt:  01/20/2018   Sumire Halbleib L. Chapin Arduini, D.O. [facility] [address], [place] [address] Cell Phone (Mon-Fri 8am-5pm):  [phone] On Call:  [phone] & follow prompts after 5pm & weekends Office Phone:  [phone] Office Fax:  [phone]

## 2017-09-16 NOTE — Telephone Encounter (Signed)
Tried to reach regarding voicemail I did leave a message to verify 9/30

## 2017-09-23 ENCOUNTER — Ambulatory Visit: Payer: Medicare Other | Admitting: Oncology

## 2017-09-23 ENCOUNTER — Ambulatory Visit: Payer: Medicare Other

## 2017-09-23 ENCOUNTER — Other Ambulatory Visit: Payer: Medicare Other

## 2017-10-02 ENCOUNTER — Other Ambulatory Visit: Payer: Self-pay | Admitting: Adult Health

## 2017-10-02 DIAGNOSIS — C50511 Malignant neoplasm of lower-outer quadrant of right female breast: Secondary | ICD-10-CM

## 2017-10-02 DIAGNOSIS — Z170 Estrogen receptor positive status [ER+]: Principal | ICD-10-CM

## 2017-10-05 ENCOUNTER — Inpatient Hospital Stay: Payer: Medicare Other

## 2017-10-05 ENCOUNTER — Inpatient Hospital Stay (HOSPITAL_BASED_OUTPATIENT_CLINIC_OR_DEPARTMENT_OTHER): Payer: Medicare Other | Admitting: Adult Health

## 2017-10-05 ENCOUNTER — Inpatient Hospital Stay: Payer: Medicare Other | Attending: Oncology

## 2017-10-05 ENCOUNTER — Encounter: Payer: Self-pay | Admitting: Adult Health

## 2017-10-05 VITALS — BP 133/58 | HR 63 | Temp 97.8°F | Resp 18 | Ht 61.0 in | Wt 104.2 lb

## 2017-10-05 DIAGNOSIS — Z79810 Long term (current) use of selective estrogen receptor modulators (SERMs): Secondary | ICD-10-CM | POA: Diagnosis not present

## 2017-10-05 DIAGNOSIS — Z803 Family history of malignant neoplasm of breast: Secondary | ICD-10-CM | POA: Insufficient documentation

## 2017-10-05 DIAGNOSIS — C50511 Malignant neoplasm of lower-outer quadrant of right female breast: Secondary | ICD-10-CM

## 2017-10-05 DIAGNOSIS — M810 Age-related osteoporosis without current pathological fracture: Secondary | ICD-10-CM | POA: Insufficient documentation

## 2017-10-05 DIAGNOSIS — Z923 Personal history of irradiation: Secondary | ICD-10-CM

## 2017-10-05 DIAGNOSIS — Z800 Family history of malignant neoplasm of digestive organs: Secondary | ICD-10-CM | POA: Insufficient documentation

## 2017-10-05 DIAGNOSIS — Z87891 Personal history of nicotine dependence: Secondary | ICD-10-CM | POA: Diagnosis not present

## 2017-10-05 DIAGNOSIS — Z170 Estrogen receptor positive status [ER+]: Secondary | ICD-10-CM | POA: Diagnosis not present

## 2017-10-05 DIAGNOSIS — Z79899 Other long term (current) drug therapy: Secondary | ICD-10-CM

## 2017-10-05 LAB — CMP (CANCER CENTER ONLY)
ALT: 13 U/L (ref 0–44)
ANION GAP: 8 (ref 5–15)
AST: 18 U/L (ref 15–41)
Albumin: 3.8 g/dL (ref 3.5–5.0)
Alkaline Phosphatase: 51 U/L (ref 38–126)
BILIRUBIN TOTAL: 0.4 mg/dL (ref 0.3–1.2)
BUN: 24 mg/dL — ABNORMAL HIGH (ref 8–23)
CO2: 28 mmol/L (ref 22–32)
Calcium: 9.1 mg/dL (ref 8.9–10.3)
Chloride: 100 mmol/L (ref 98–111)
Creatinine: 0.83 mg/dL (ref 0.44–1.00)
GFR, Estimated: 60 mL/min — ABNORMAL LOW (ref >60)
GLUCOSE: 93 mg/dL (ref 70–99)
POTASSIUM: 4.2 mmol/L (ref 3.5–5.1)
SODIUM: 136 mmol/L (ref 135–145)
TOTAL PROTEIN: 6.6 g/dL (ref 6.5–8.1)

## 2017-10-05 LAB — CBC WITH DIFFERENTIAL (CANCER CENTER ONLY)
BASOS ABS: 0.1 10*3/uL (ref 0.0–0.1)
Basophils Relative: 2 %
Eosinophils Absolute: 0.1 10*3/uL (ref 0.0–0.5)
Eosinophils Relative: 2 %
HEMATOCRIT: 38.1 % (ref 34.8–46.6)
HEMOGLOBIN: 12.8 g/dL (ref 11.6–15.9)
LYMPHS PCT: 28 %
Lymphs Abs: 2.1 10*3/uL (ref 0.9–3.3)
MCH: 33.5 pg (ref 25.1–34.0)
MCHC: 33.5 g/dL (ref 31.5–36.0)
MCV: 100 fL (ref 79.5–101.0)
Monocytes Absolute: 0.7 10*3/uL (ref 0.1–0.9)
Monocytes Relative: 9 %
NEUTROS ABS: 4.5 10*3/uL (ref 1.5–6.5)
NEUTROS PCT: 59 %
Platelet Count: ADEQUATE 10*3/uL (ref 145–400)
RBC: 3.81 MIL/uL (ref 3.70–5.45)
RDW: 13.6 % (ref 11.2–14.5)
WBC: 7.5 10*3/uL (ref 3.9–10.3)

## 2017-10-05 MED ORDER — ZOLEDRONIC ACID 4 MG/5ML IV CONC
3.0000 mg | Freq: Once | INTRAVENOUS | Status: AC
  Administered 2017-10-05: 3 mg via INTRAVENOUS
  Filled 2017-10-05: qty 3.75

## 2017-10-05 NOTE — Patient Instructions (Signed)
[facility] Discharge Instructions for Patients Receiving Chemotherapy  Today you received the following chemotherapy agents Zometa.  To help prevent nausea and vomiting after your treatment, we encourage you to take your nausea medication as directed.  If you develop nausea and vomiting that is not controlled by your nausea medication, call the clinic.   BELOW ARE SYMPTOMS THAT SHOULD BE REPORTED IMMEDIATELY:  *FEVER GREATER THAN 100.5 F  *CHILLS WITH OR WITHOUT FEVER  NAUSEA AND VOMITING THAT IS NOT CONTROLLED WITH YOUR NAUSEA MEDICATION  *UNUSUAL SHORTNESS OF BREATH  *UNUSUAL BRUISING OR BLEEDING  TENDERNESS IN MOUTH AND THROAT WITH OR WITHOUT PRESENCE OF ULCERS  *URINARY PROBLEMS  *BOWEL PROBLEMS  UNUSUAL RASH Items with * indicate a potential emergency and should be followed up as soon as possible.  Feel free to call the clinic you have any questions or concerns. The clinic phone number is [phone].  Please show the CHEMO ALERT CARD at check-in to the Emergency Department and triage nurse.    

## 2017-10-05 NOTE — Progress Notes (Signed)
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 Preoperative MRI showed solitary mass. She had lumpectomy 12-27-2007 with 1.5 cm grade 3 invasive ductal carcinoma, no axillary nodes evaluated. She had RT to breast and axilla by Dr [name]. Patient was not inclined to pursue adjuvant chemotherapy or herceptin at diagnosis. She was on Femara from 03/2008 thru 11/2009 when this was changed to tamoxifen due to osteoporosis. She has tolerated the tamoxifen without difficulty. She has not had hysterectomy. Last mammograms were at [facility] 11-21-2011 with scattered fibroglandular densities and no mammographic findings of concern. She had unremarkable CXR with ED evaluation for infectious cough in Jan 2014."  The patient's subsequent history is as detailed below  INTERVAL HISTORY: Cynthia Watkins returns today for followup of her estrogen receptor positive breast cancer. She continues on tamoxifen and is tolerating it well.  She denies hot flashes, vaginal discharge, or increased aches and pains.    REVIEW OF SYSTEMS: Cynthia Watkins notes no medical issues this past year.  She is also due for Zometa this year.  She says she tolerates this well.  She denies any dental concerns, or recent dental extractions or dental work.   She is without any unusual headaches or vision changes.  She denies any nausea, vomiting, bowel/bladder changes.  She isn't experiencing chest pain, palpitations, or shortness of breath.  She notes that she walks about 4 miles per day.   Otherwise, a detailed ROS was non contributory today.     PAST MEDICAL HISTORY: Past Medical History:  Diagnosis Date  . Acute upper respiratory infections of unspecified site 06/09/2011  . Breast cancer ([place]) 12/11/2008  . Fatigue 01/2012  . Hypercholesterolemia    on lipitor  . Hypertension   . Hyponatremia 01/2016  . Memory loss   . Mitral valve prolapse   . Other abnormal blood chemistry 01/21/2010  . Palpitations 08/05/2000  . Right bundle branch block   . Senile osteoporosis 09/28/2001    PAST SURGICAL HISTORY: Past Surgical History:  Procedure Laterality Date  . BREAST LUMPECTOMY Right 12/27/2007  . LAPAROSCOPIC LYSIS OF ADHESIONS  07/15/2006   [name]  . OTHER SURGICAL HISTORY    . TONSILLECTOMY    . TUBAL LIGATION  1963    FAMILY HISTORY Family History  Problem Relation Age of Onset  . Stroke Mother   . Cancer Father        colon  . Alzheimer's disease Sister    the patient's father had a history of colon cancer. 2 of his sisters had breast cancer, but not diagnosed before age 86. The patient's mother lived to be 62. The patient's sister was diagnosed with breast cancer in her 80s.  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 Her husband, [name] was a pediatric surgeon and died earlier this year. Her son [name] of course is a urologist locally. The patient also has children in [place] and [place]. Her  fourth child unfortunately died in a drowning accident. She has 7 grandchildren and 2 stepgrandchildren. She attends the [employer] DIRECTIVES: In place; her son [name] is her healthcare power of attorney   HEALTH MAINTENANCE: Social History   Tobacco Use  . Smoking status: Former Smoker    Packs/day: 0.25    Years: 10.00    Pack years: 2.50    Last attempt to quit: 08/06/1954    Years since quitting: 63.2  . Smokeless tobacco:  Never Used  Substance Use Topics  . Alcohol use: Yes    Comment: Occasionally   . Drug use: No     No Known Allergies  Current Outpatient Medications  Medication Sig Dispense Refill  . atenolol (TENORMIN) 25 MG tablet Take 0.125 mg by mouth daily.    . Cholecalciferol (VITAMIN D3) 1000 units CAPS Take 2,000 Units by mouth daily.    . Melatonin 3 MG CAPS Take 1 capsule (3 mg total) by mouth at bedtime as needed (sleep). 30 capsule 0  . tamoxifen (NOLVADEX) 20 MG tablet TAKE 1 TABLET ONCE DAILY. 90 tablet PRN   No current facility-administered medications for this visit.     OBJECTIVE: Elderly white woman Who appears well  Vitals:   10/05/17 1456  BP: (!) 133/58  Pulse: 63  Resp: 18  Temp: 97.8 F (36.6 C)  SpO2: 98%     Body mass index is 19.69 kg/m.    ECOG FS:0 - Asymptomatic  GENERAL: Patient is a well appearing female in no acute distress HEENT:  Sclerae anicteric.  Oropharynx clear and moist. No ulcerations or evidence of oropharyngeal candidiasis. Neck is supple.  NODES:  No cervical, supraclavicular, or axillary lymphadenopathy palpated.  BREAST EXAM:  Right breast s/p lumpectomy, no sign of recurrence, left breast benign LUNGS:  Clear to auscultation bilaterally.  No wheezes or rhonchi. HEART:  Regular rate and rhythm. No murmur appreciated. ABDOMEN:  Soft, nontender.  Positive, normoactive bowel sounds. No organomegaly palpated. MSK:  No focal spinal tenderness to palpation. Full range of motion bilaterally in the upper extremities. EXTREMITIES:  No peripheral edema.   SKIN:  Clear with no obvious rashes or skin changes. No nail dyscrasia. NEURO:  Nonfocal. Well oriented.  Appropriate affect.    LAB RESULTS:  CMP     Component Value Date/Time   NA 138 08/18/2017 0600   NA 132 (L) 09/16/2016 1435   K 4.4 08/18/2017 0600   K 4.8 09/16/2016 1435   CL 102 02/04/2016 1210   CL 98 02/17/2012 1500   CO2 29 09/16/2016 1435   GLUCOSE 87 09/16/2016 1435    GLUCOSE 134 (H) 02/17/2012 1500   BUN 19 08/18/2017 0600   BUN 21.5 09/16/2016 1435   CREATININE 0.7 08/18/2017 0600   CREATININE 0.8 09/16/2016 1435   CALCIUM 9.5 09/16/2016 1435   PROT 6.5 09/16/2016 1435   ALBUMIN 3.7 09/16/2016 1435   AST 18 09/16/2016 1435   ALT 11 09/16/2016 1435   ALKPHOS 45 09/16/2016 1435   BILITOT 0.30 09/16/2016 1435   GFRNONAA >60 02/04/2016 1210   GFRAA >60 02/04/2016 1210    I No results found for: SPEP  Lab Results  Component Value Date   WBC 7.2 09/16/2016   NEUTROABS 4.2 09/16/2016   HGB 12.8 09/16/2016   HCT 37.6 09/16/2016   MCV 99.7 09/16/2016   PLT 227 09/16/2016      Chemistry      Component Value Date/Time  NA 138 08/18/2017 0600   NA 132 (L) 09/16/2016 1435   K 4.4 08/18/2017 0600   K 4.8 09/16/2016 1435   CL 102 02/04/2016 1210   CL 98 02/17/2012 1500   CO2 29 09/16/2016 1435   BUN 19 08/18/2017 0600   BUN 21.5 09/16/2016 1435   CREATININE 0.7 08/18/2017 0600   CREATININE 0.8 09/16/2016 1435   GLU 107 08/18/2017 0600      Component Value Date/Time   CALCIUM 9.5 09/16/2016 1435   ALKPHOS 45 09/16/2016 1435   AST 18 09/16/2016 1435   ALT 11 09/16/2016 1435   BILITOT 0.30 09/16/2016 1435       Lab Results  Component Value Date   LABCA2 33 02/17/2012    No components found for: [id number]  No results for input(s): INR in the last 168 hours.  Urinalysis    Component Value Date/Time   COLORURINE STRAW (A) 02/04/2016 1229   APPEARANCEUR CLEAR 02/04/2016 1229   LABSPEC 1.004 (L) 02/04/2016 1229   PHURINE 8.0 02/04/2016 1229   GLUCOSEU NEGATIVE 02/04/2016 1229   HGBUR SMALL (A) 02/04/2016 1229   BILIRUBINUR NEGATIVE 02/04/2016 1229   KETONESUR NEGATIVE 02/04/2016 1229   PROTEINUR NEGATIVE 02/04/2016 1229   UROBILINOGEN 0.2 12/23/2007 0945   NITRITE NEGATIVE 02/04/2016 1229   LEUKOCYTESUR NEGATIVE 02/04/2016 1229    STUDIES:   ASSESSMENT: 84 y.o. [facility] resident status post right breast lower outer  quadrant lumpectomy 12/27/2007 for a pT1c pNX, stage IA invasive ductal carcinoma, grade 3, estrogen receptor 98% positive, progesterone receptor 32% positive, with an MIB-1 of 14% and HercepTest positive at 3+  (1) declined adjuvant chemotherapy/trastuzumab  (2) completed adjuvant radiation to the right breast and axilla February 2010  (3) antiestrogen therapy: Anastrozole March 2010 through November 2011, at which point she switched to tamoxifen  (4) osteoporosis;  DEXA scan at [facility] 11/21/2011 showed a T score of -3.3  (a) zolendronate given 09/22/2013,   (b) repeat bone density scan at [facility] January 2017 showed a T score of -3.5  (c) zolendronate repeated 10/01/2015  (d) bone density 03/09/2017 shows osteoporosis with T score of -2.5  PLAN: Chizuko is doing well today.  She is without any clinical sign of recurrence.  We reviewed her most recent bone density report which shows continued osteoporosis.  She will receive the Zolendronate today.  She is tolerating Tamoxifen well.  She will continue this through the end of this year.    Estill Bamberg and I reviewed that she can graduate today.  This means that she can transition back to seeing her PCP primarily.  We recommend she continue with annual mammogram, biennial bone density testing, and annual breast exam.  She will need to f/u with her PCP regarding her osteoporosis as well.  She does exercise and eat healthy, and I encouraged her to continue this.    She will return on an as needed basis from here on out.  She knows that if she does need anything in the future that we are always happy to see her.    A total of (30) minutes of face-to-face time was spent with this patient with greater than 50% of that time in counseling and care-coordination.   Wilber Bihari, NP  10/05/17 3:04 PM Medical Oncology and Hematology [facility] [address] [place], [place] [address] Tel. [phone]    Fax. [phone]

## 2018-01-01 ENCOUNTER — Other Ambulatory Visit: Payer: Self-pay | Admitting: Oncology

## 2018-01-01 DIAGNOSIS — C50511 Malignant neoplasm of lower-outer quadrant of right female breast: Secondary | ICD-10-CM

## 2018-01-12 DIAGNOSIS — D649 Anemia, unspecified: Secondary | ICD-10-CM | POA: Diagnosis not present

## 2018-01-12 DIAGNOSIS — I10 Essential (primary) hypertension: Secondary | ICD-10-CM | POA: Diagnosis not present

## 2018-01-12 LAB — BASIC METABOLIC PANEL
BUN: 19 (ref 4–21)
Creatinine: 0.7 (ref 0.5–1.1)
Glucose: 97
Potassium: 4.4 (ref 3.4–5.3)
Sodium: 139 (ref 137–147)

## 2018-01-15 ENCOUNTER — Encounter: Payer: Self-pay | Admitting: Internal Medicine

## 2018-01-15 DIAGNOSIS — N390 Urinary tract infection, site not specified: Secondary | ICD-10-CM | POA: Diagnosis not present

## 2018-01-15 LAB — CBC AND DIFFERENTIAL
HCT: 40 (ref 36–46)
Hemoglobin: 13.9 (ref 12.0–16.0)
Platelets: 211 (ref 150–399)
WBC: 9.3

## 2018-01-18 ENCOUNTER — Encounter: Payer: Self-pay | Admitting: Internal Medicine

## 2018-01-20 ENCOUNTER — Encounter: Payer: Medicare Other | Admitting: Internal Medicine

## 2018-01-21 ENCOUNTER — Telehealth: Payer: Self-pay | Admitting: Internal Medicine

## 2018-01-21 DIAGNOSIS — H9193 Unspecified hearing loss, bilateral: Secondary | ICD-10-CM

## 2018-01-21 NOTE — Telephone Encounter (Signed)
Referral entered  

## 2018-01-21 NOTE — Telephone Encounter (Signed)
This is fine 

## 2018-01-21 NOTE — Telephone Encounter (Signed)
Pt has an appt to [facility] for progressive hearing loss. Could we fax over referral to: Dr Randall Hiss [facility] [phone] pts appt is tues 01/26/2018 at 2p.  Thanks, Vilinda Blanks.

## 2018-02-03 ENCOUNTER — Encounter: Payer: Self-pay | Admitting: Internal Medicine

## 2018-02-03 ENCOUNTER — Non-Acute Institutional Stay: Payer: Medicare Other | Admitting: Internal Medicine

## 2018-02-03 VITALS — BP 110/60 | HR 65 | Temp 98.5°F | Ht 61.0 in | Wt 103.0 lb

## 2018-02-03 DIAGNOSIS — E871 Hypo-osmolality and hyponatremia: Secondary | ICD-10-CM | POA: Diagnosis not present

## 2018-02-03 DIAGNOSIS — R739 Hyperglycemia, unspecified: Secondary | ICD-10-CM

## 2018-02-03 DIAGNOSIS — H9193 Unspecified hearing loss, bilateral: Secondary | ICD-10-CM | POA: Diagnosis not present

## 2018-02-03 DIAGNOSIS — H6123 Impacted cerumen, bilateral: Secondary | ICD-10-CM | POA: Diagnosis not present

## 2018-02-03 DIAGNOSIS — G3184 Mild cognitive impairment, so stated: Secondary | ICD-10-CM

## 2018-02-03 DIAGNOSIS — M810 Age-related osteoporosis without current pathological fracture: Secondary | ICD-10-CM | POA: Diagnosis not present

## 2018-02-03 NOTE — Progress Notes (Signed)
Location:  Occupational psychologist of Service:  Clinic (12)  Provider: Terrian Sentell L. Mariea Clonts, D.O., C.M.D.  Code Status: DNR Goals of Care:  Advanced Directives 09/15/2017  Does Patient Have a Medical Advance Directive? Yes  Type of Advance Directive [place]  Does patient want to make changes to medical advance directive? No - Patient declined  Copy of [place] in Chart? Yes  Would patient like information on creating a medical advance directive? -  Pre-existing out of facility DNR order (yellow form or pink MOST form) -   Chief Complaint  Patient presents with  . Medical Management of Chronic Issues    61mth follow-up    HPI: Patient is a 85 y.o. female seen today for medical management of chronic diseases.    Hyponatremia:  Na was 139.   She has no new concerns.   Reviewed labs with her.    She does notice some trouble with her memory.  Usually writes down appts to keep track.  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    Had zometa infusion for her bones/osteoporosis.   No problems that she recalls.  Once a year.    Had cerumen impaction at audiology--ears were flushed here today.  appt was last Tuesday.  She had all tests done and then the office said they are moving to [facility].  Her son went along to the appt.    Past Medical History:  Diagnosis Date  . Acute upper respiratory infections of unspecified site 06/09/2011  . Breast cancer ([place]) 12/11/2008  . Fatigue 01/2012  . Hypercholesterolemia    on lipitor  . Hypertension   . Hyponatremia 01/2016  . Memory loss   . Mitral valve prolapse   . Other abnormal blood chemistry 01/21/2010  . Palpitations 08/05/2000  . Right bundle branch block   . Senile osteoporosis 09/28/2001    Past Surgical History:  Procedure Laterality Date  . BREAST LUMPECTOMY Right 12/27/2007  . LAPAROSCOPIC LYSIS OF ADHESIONS   07/15/2006   [name]  . OTHER SURGICAL HISTORY    . TONSILLECTOMY    . TUBAL LIGATION  1963    No Known Allergies  Outpatient Encounter Medications as of 02/03/2018  Medication Sig  . atenolol (TENORMIN) 25 MG tablet Take 0.125 mg by mouth daily.  . Cholecalciferol (VITAMIN D3) 1000 units CAPS Take 2,000 Units by mouth daily.  . Melatonin 3 MG CAPS Take 1 capsule (3 mg total) by mouth at bedtime as needed (sleep).  . [DISCONTINUED] tamoxifen (NOLVADEX) 20 MG tablet TAKE 1 TABLET ONCE DAILY.   No facility-administered encounter medications on file as of 02/03/2018.     Review of Systems:  Review of Systems  Constitutional: Negative for chills, fever and malaise/fatigue.  HENT: Positive for hearing loss.        Had cerumen impaction today  Eyes: Negative for blurred vision.       Glasses  Respiratory: Negative for cough and shortness of breath.   Cardiovascular: Negative for chest pain, palpitations and leg swelling.  Gastrointestinal: Negative for abdominal pain, blood in stool, constipation, heartburn and melena.  Genitourinary: Positive for frequency. Negative for dysuria.  Musculoskeletal: Negative for falls and joint pain.  Skin: Negative for itching and rash.  Neurological: Negative for dizziness and loss of consciousness.  Endo/Heme/Allergies: Does not bruise/bleed easily.  Psychiatric/Behavioral: Positive for memory loss. Negative for depression. The patient is not nervous/anxious and  does not have insomnia.     Health Maintenance  Topic Date Due  . TETANUS/TDAP  05/16/1943  . MAMMOGRAM  03/10/2018  . INFLUENZA VACCINE  Completed  . DEXA SCAN  Completed  . PNA vac Low Risk Adult  Completed    Physical Exam: Vitals:   02/03/18 1509  Weight: 103 lb (46.7 kg)  Height: 5\' 1"  (1.549 m)   Body mass index is 19.46 kg/m. Physical Exam Vitals signs reviewed.  Constitutional:      Appearance: Normal appearance.     Comments: thin  HENT:     Head: Normocephalic  and atraumatic.     Right Ear: There is impacted cerumen.     Left Ear: There is impacted cerumen.     Nose: Nose normal.  Eyes:     Comments: glasses  Cardiovascular:     Rate and Rhythm: Normal rate and regular rhythm.     Pulses: Normal pulses.     Heart sounds: Normal heart sounds.  Pulmonary:     Effort: Pulmonary effort is normal.     Breath sounds: Normal breath sounds. No rales.  Abdominal:     General: Bowel sounds are normal.  Musculoskeletal: Normal range of motion.  Skin:    General: Skin is warm and dry.  Neurological:     General: No focal deficit present.     Mental Status: She is alert and oriented to person, place, and time.  Psychiatric:        Mood and Affect: Mood normal.        Behavior: Behavior normal.     Labs reviewed: Basic Metabolic Panel: Recent Labs    08/18/17 0600 10/05/17 1436 01/12/18  NA 138 136 139  K 4.4 4.2 4.4  CL  --  100  --   CO2  --  28  --   GLUCOSE  --  93  --   BUN 19 24* 19  CREATININE 0.7 0.83 0.7  CALCIUM  --  9.1  --    Liver Function Tests: Recent Labs    10/05/17 1436  AST 18  ALT 13  ALKPHOS 51  BILITOT 0.4  PROT 6.6  ALBUMIN 3.8   No results for input(s): LIPASE, AMYLASE in the last 8760 hours. No results for input(s): AMMONIA in the last 8760 hours. CBC: Recent Labs    10/05/17 1436 01/15/18  WBC 7.5 9.3  NEUTROABS 4.5  --   HGB 12.8 13.9  HCT 38.1 40  MCV 100.0  --   PLT PLATELET CLUMPS NOTED ON SMEAR, COUNT APPEARS ADEQUATE 211   Lipid Panel: No results for input(s): CHOL, HDL, LDLCALC, TRIG, CHOLHDL, LDLDIRECT in the last 8760 hours. Lab Results  Component Value Date   HGBA1C 5.9 (H) 01/23/2016    Assessment/Plan 1. Progressive hearing loss of both ears -had audiology appt and ears were impacted with cerumen so they were now flushed today -need audiology note  2. Bilateral impacted cerumen -flushed with warm water and peroxide mixture today with resolution of impaction  3.  Hyponatremia -doing fine now with Na 139 last check -f/u bmp in 4 mos per her request  4. Senile osteoporosis -cont vitamin D 2000 units daily and annual zometa infusions (last in sept '19) -now off tamoxifen for her breast cancer  5. Hyperglycemia -not recently an issue, monitor  6. Mild cognitive impairment -seems to be progressing slightly, has not had dangerous things happening thus far like leaving stove on, getting lost or  forgetting bills, has missed some appts, but is unaware -still seems to be managing in [place] setting, cont to monitor  Labs/tests ordered: BMP  Next appt:  06/02/2018  Nhung Danko L. Farhana Fellows, D.O. [facility] [address], [place] [address] Cell Phone (Mon-Fri 8am-5pm):  [phone] On Call:  [phone] & follow prompts after 5pm & weekends Office Phone:  [phone] Office Fax:  [phone]

## 2018-05-25 DIAGNOSIS — E871 Hypo-osmolality and hyponatremia: Secondary | ICD-10-CM | POA: Diagnosis not present

## 2018-05-25 DIAGNOSIS — D649 Anemia, unspecified: Secondary | ICD-10-CM | POA: Diagnosis not present

## 2018-05-25 LAB — BASIC METABOLIC PANEL
BUN: 19 (ref 4–21)
Creatinine: 0.6 (ref 0.5–1.1)
Glucose: 95
Potassium: 4.6 (ref 3.4–5.3)
Sodium: 137 (ref 137–147)

## 2018-06-01 ENCOUNTER — Encounter: Payer: Self-pay | Admitting: Internal Medicine

## 2018-06-01 ENCOUNTER — Other Ambulatory Visit: Payer: Self-pay | Admitting: *Deleted

## 2018-06-02 ENCOUNTER — Other Ambulatory Visit: Payer: Self-pay

## 2018-06-02 ENCOUNTER — Encounter: Payer: Self-pay | Admitting: Internal Medicine

## 2018-06-02 ENCOUNTER — Non-Acute Institutional Stay: Payer: Medicare Other | Admitting: Internal Medicine

## 2018-06-02 ENCOUNTER — Other Ambulatory Visit: Payer: Self-pay | Admitting: Cardiology

## 2018-06-02 VITALS — BP 118/66 | HR 70 | Temp 98.8°F | Ht 61.0 in | Wt 105.0 lb

## 2018-06-02 DIAGNOSIS — R739 Hyperglycemia, unspecified: Secondary | ICD-10-CM

## 2018-06-02 DIAGNOSIS — M810 Age-related osteoporosis without current pathological fracture: Secondary | ICD-10-CM

## 2018-06-02 DIAGNOSIS — I10 Essential (primary) hypertension: Secondary | ICD-10-CM

## 2018-06-02 DIAGNOSIS — E7800 Pure hypercholesterolemia, unspecified: Secondary | ICD-10-CM

## 2018-06-02 DIAGNOSIS — E871 Hypo-osmolality and hyponatremia: Secondary | ICD-10-CM

## 2018-06-02 DIAGNOSIS — R350 Frequency of micturition: Secondary | ICD-10-CM

## 2018-06-02 NOTE — Progress Notes (Signed)
Location:  [facility] clinic Provider:  Delana Manganello L. Mariea Clonts, D.O., C.M.D.  Code Status: DNR Goals of Care:  Advanced Directives 09/15/2017  Does Patient Have a Medical Advance Directive? Yes  Type of Advance Directive [place]  Does patient want to make changes to medical advance directive? No - Patient declined  Copy of [place] in Chart? Yes  Would patient like information on creating a medical advance directive? -  Pre-existing out of facility DNR order (yellow form or pink MOST form) -     Chief Complaint  Patient presents with  . Medical Management of Chronic Issues    64mth follow-up    HPI: Patient is a 85 y.o. female seen today for medical management of chronic diseases.    Sodium level within range.    No concerns.  She is still walking.    She is trying to stay healthy.    She does not hurt.  She had a fall unclear why.  Her glasses fell off.  She landed on the right knee.  She thinks she tripped on the carpet--happened about a month ago.  She got up and went on.  She was sore afterwards.    She is hearing better today than last time.  Not first class!    She admits she does not remember everything, but can't complain too much.  She remembers a lot of speeches she memorized.  She'd like to take a memory class.  We talked about [facility] [employer] memory fitness (she wrote this down).  Does crossword puzzles, reads a lot.  Was thinking about trying to learn a new language.  She did chair fit live before--says she is going to do the once a week class.    She'd been taking 1/4 of an atenolol, has run out and bp still in normal range.  She has an apt with Dr. Martinique coming up.  bp here normal to low so may not need the atenolol.  Past Medical History:  Diagnosis Date  . Acute upper respiratory infections of unspecified site 06/09/2011  . Breast cancer ([place]) 12/11/2008  . Fatigue 01/2012  . Hypercholesterolemia    on lipitor  .  Hypertension   . Hyponatremia 01/2016  . Memory loss   . Mitral valve prolapse   . Other abnormal blood chemistry 01/21/2010  . Palpitations 08/05/2000  . Right bundle branch block   . Senile osteoporosis 09/28/2001    Past Surgical History:  Procedure Laterality Date  . BREAST LUMPECTOMY Right 12/27/2007  . LAPAROSCOPIC LYSIS OF ADHESIONS  07/15/2006   [name]  . OTHER SURGICAL HISTORY    . TONSILLECTOMY    . TUBAL LIGATION  1963    No Known Allergies  Outpatient Encounter Medications as of 06/02/2018  Medication Sig  . atenolol (TENORMIN) 25 MG tablet TAKE (1/2) TABLET DAILY.  Marland Kitchen Cholecalciferol (VITAMIN D3) 1000 units CAPS Take 2,000 Units by mouth daily.  . [DISCONTINUED] atenolol (TENORMIN) 25 MG tablet Take 0.125 mg by mouth daily.  . [DISCONTINUED] Melatonin 3 MG CAPS Take 1 capsule (3 mg total) by mouth at bedtime as needed (sleep).   No facility-administered encounter medications on file as of 06/02/2018.     Review of Systems:  Review of Systems  Constitutional: Negative for chills, fever, malaise/fatigue and weight loss.  HENT: Positive for hearing loss. Negative for congestion.   Eyes: Negative for blurred vision.  Respiratory: Negative for cough and shortness of breath.  Cardiovascular: Negative for chest pain, palpitations and leg swelling.  Gastrointestinal: Negative for abdominal pain, blood in stool, constipation, diarrhea, heartburn, melena, nausea and vomiting.  Genitourinary: Positive for frequency. Negative for dysuria, flank pain, hematuria and urgency.  Musculoskeletal: Negative for back pain, falls and joint pain.  Skin: Negative for itching and rash.  Neurological: Negative for dizziness and loss of consciousness.  Endo/Heme/Allergies: Does not bruise/bleed easily.  Psychiatric/Behavioral: Positive for memory loss. Negative for depression. The patient is not nervous/anxious and does not have insomnia.     Health Maintenance  Topic Date Due  .  TETANUS/TDAP  05/16/1943  . MAMMOGRAM  03/10/2018  . INFLUENZA VACCINE  08/07/2018  . DEXA SCAN  Completed  . PNA vac Low Risk Adult  Completed    Physical Exam: Vitals:   06/02/18 1531  Weight: 105 lb (47.6 kg)  Height: 5\' 1"  (1.549 m)   Body mass index is 19.84 kg/m. Physical Exam Vitals signs reviewed.  Constitutional:      General: She is not in acute distress.    Appearance: Normal appearance. She is normal weight.  HENT:     Head: Normocephalic and atraumatic.  Cardiovascular:     Rate and Rhythm: Normal rate and regular rhythm.     Pulses: Normal pulses.     Heart sounds: Normal heart sounds.  Pulmonary:     Effort: Pulmonary effort is normal.     Breath sounds: Normal breath sounds.  Abdominal:     General: Abdomen is flat. Bowel sounds are normal.  Musculoskeletal: Normal range of motion.  Skin:    General: Skin is warm and dry.     Capillary Refill: Capillary refill takes less than 2 seconds.  Neurological:     General: No focal deficit present.     Mental Status: She is alert and oriented to person, place, and time.     Cranial Nerves: No cranial nerve deficit.     Motor: No weakness.     Gait: Gait normal.  Psychiatric:     Comments: Seems a bit anxious, progressively noting this visit to visit     Labs reviewed: Basic Metabolic Panel: Recent Labs    10/05/17 1436 01/12/18 05/25/18 0800  NA 136 139 137  K 4.2 4.4 4.6  CL 100  --   --   CO2 28  --   --   GLUCOSE 93  --   --   BUN 24* 19 19  CREATININE 0.83 0.7 0.6  CALCIUM 9.1  --   --    Liver Function Tests: Recent Labs    10/05/17 1436  AST 18  ALT 13  ALKPHOS 51  BILITOT 0.4  PROT 6.6  ALBUMIN 3.8   No results for input(s): LIPASE, AMYLASE in the last 8760 hours. No results for input(s): AMMONIA in the last 8760 hours. CBC: Recent Labs    10/05/17 1436 01/15/18  WBC 7.5 9.3  NEUTROABS 4.5  --   HGB 12.8 13.9  HCT 38.1 40  MCV 100.0  --   PLT PLATELET CLUMPS NOTED ON  SMEAR, COUNT APPEARS ADEQUATE 211   Lipid Panel: No results for input(s): CHOL, HDL, LDLCALC, TRIG, CHOLHDL, LDLDIRECT in the last 8760 hours. Lab Results  Component Value Date   HGBA1C 5.9 (H) 01/23/2016     Assessment/Plan 1. Essential hypertension, benign -bp has been excellent despite running out of her atenolol (had been told she needed an appt with cardiology for refill--of note, she does have  the h/o PACs and NSVT of 7 beats on her event monitor in 2016 and some rapid heart rate when she was in rehab and had been off some of her regular medication -she favors staying off until she sees cardiology so atenolol was removed from her meds pending appt with Dr. Martinique, et al  2. Senile osteoporosis -continues on vitamin D3; has not wanted to take other meds for bone density or bone growth due to concerns about side effects  3. Hyponatremia -sodium recently staying in range  4. Hyperglycemia -mildly elevated; cont her walking and healthy diet Lab Results  Component Value Date   HGBA1C 5.9 (H) 01/23/2016    5. Hypercholesterolemia -last LDL was trending up; would not be aggressive about this at her age and with her excellent diet and exercise program  6. Urinary frequency -suggested she discuss this concern with her son, the urologist  Labs/tests ordered:  3 mos for sodium, 6 mos to see me   Next appt:  6 mos    Ricky Doan L. Willet Schleifer, D.O. [facility] [address], [place] [address] Cell Phone (Mon-Fri 8am-5pm):  [phone] On Call:  [phone] & follow prompts after 5pm & weekends Office Phone:  [phone] Office Fax:  [phone]

## 2018-07-11 NOTE — Progress Notes (Deleted)
Cynthia Watkins Date of Birth: [birth year]   History of Present Illness: Cynthia Watkins is seen for follow up. She has a history of hypertension, hyperlipidemia, and a right bundle branch block. She was seen in June 2016 with complaints of palpitations and feeling faint.  Event monitor showed no arrhythmia. Echo noted below. All antihypertensive therapy was discontinued. She was seen again last year with more palpitations. Event monitor showed frequent PACs and one short run of NSVT 7 beats. She was managed with low dose beta blocker.  In January 2018 she was admitted to the hospital after a Gi illness with acute encephalopathy and sodium level down to 107. She was treated with hypertonic saline and lasix. Sodium improved to 129. She was DC on atenolol 12.5 mg daily and lasix 20 mg daily. She returned to the ED Jan 29 with symptoms of SOB and lightheadedness. Sodium improved to 133. BNP 150. CXR showed no acute change. It was felt that she had a panic attack and was hyperventilating. She was orthostatic and her lasix was discontinued.   On follow up today she states she is doing well. She is very active walking 4-6 miles/day and doing an exercise class 2x/week. No dyspnea, chest pain, palpitations, dizziness. She no longer drives.  Current Outpatient Medications on File Prior to Visit  Medication Sig Dispense Refill  . Cholecalciferol (VITAMIN D3) 1000 units CAPS Take 2,000 Units by mouth daily.     No current facility-administered medications on file prior to visit.     No Known Allergies  Past Medical History:  Diagnosis Date  . Acute upper respiratory infections of unspecified site 06/09/2011  . Breast cancer ([place]) 12/11/2008  . Fatigue 01/2012  . Hypercholesterolemia    on lipitor  . Hypertension   . Hyponatremia 01/2016  . Memory loss   . Mitral valve prolapse   . Other abnormal blood chemistry 01/21/2010  . Palpitations 08/05/2000  . Right bundle branch block   . Senile osteoporosis  09/28/2001    Past Surgical History:  Procedure Laterality Date  . BREAST LUMPECTOMY Right 12/27/2007  . LAPAROSCOPIC LYSIS OF ADHESIONS  07/15/2006   [name]  . OTHER SURGICAL HISTORY    . TONSILLECTOMY    . TUBAL LIGATION  1963    Social History   Tobacco Use  Smoking Status Former Smoker  . Packs/day: 0.25  . Years: 10.00  . Pack years: 2.50  . Quit date: 08/06/1954  . Years since quitting: 63.9  Smokeless Tobacco Never Used    Social History   Substance and Sexual Activity  Alcohol Use Yes   Comment: Occasionally     Family History  Problem Relation Age of Onset  . Stroke Mother   . Cancer Father        colon  . Alzheimer's disease Sister     Review of Systems: As the history of present illness.  All other systems were reviewed and are negative.  Physical Exam: There were no vitals taken for this visit. GENERAL:  Well appearing elderly WF in NAD HEENT:  PERRL, EOMI, sclera are clear. Oropharynx is clear. NECK:  No jugular venous distention, carotid upstroke brisk and symmetric, no bruits, no thyromegaly or adenopathy LUNGS:  Clear to auscultation bilaterally CHEST:  Unremarkable HEART:  RRR,  PMI not displaced or sustained,S1 and S2 within normal limits, no S3, no S4: no clicks, no rubs, no murmurs ABD:  Soft, nontender. BS +, no masses or bruits. No hepatomegaly, no  splenomegaly EXT:  2 + pulses throughout, no edema, no cyanosis no clubbing SKIN:  Warm and dry.  No rashes NEURO:  Alert and oriented x 3. Cranial nerves II through XII intact. PSYCH:  Cognitively intact    LABORATORY DATA: Lab Results  Component Value Date   WBC 9.3 01/15/2018   HGB 13.9 01/15/2018   HCT 40 01/15/2018   PLT 211 01/15/2018   GLUCOSE 93 10/05/2017   CHOL 214 (H) 01/27/2016   TRIG 96 01/27/2016   HDL 62 01/27/2016   LDLCALC 133 (H) 01/27/2016   ALT 13 10/05/2017   AST 18 10/05/2017   NA 137 05/25/2018   K 4.6 05/25/2018   CL 100 10/05/2017   CREATININE 0.6  05/25/2018   BUN 19 05/25/2018   CO2 28 10/05/2017   TSH 2.10 09/09/2016   INR 1.07 01/23/2016   HGBA1C 5.9 (H) 01/23/2016   Echo 02/21/16: Study Conclusions  - Left ventricle: The cavity size was normal. Wall thickness was   normal. Features are consistent with a pseudonormal left   ventricular filling pattern, with concomitant abnormal relaxation   and increased filling pressure (grade 2 diastolic dysfunction). - Aortic valve: Mildly to moderately calcified annulus. There was   mild regurgitation. Valve area (VTI): 1.19 cm^2. Valve area   (Vmax): 1.19 cm^2. Valve area (Vmean): 1.29 cm^2. - Tricuspid valve: There was mild-moderate regurgitation. - Pulmonary arteries: Systolic pressure was mildly increased. PA   peak pressure: 36 mm Hg (S).  Ecg today shows NSR rate 57. RBBB. I have personally reviewed and interpreted this study.  Assessment / Plan: 1. Dyspnea. Resolved. No evidence of volume overload.  2. Hyponatremia. Probably related to GI illness and excessive po water intake. Now corrected.  3. Right bundle branch block, chronic.  4. History of HTN- now well controlled on minimal therapy  5. History of palpitations. Controlled on low dose atenolol.   we will continue current therapy and follow up in one year.

## 2018-07-14 ENCOUNTER — Telehealth: Payer: Self-pay | Admitting: Cardiology

## 2018-07-14 NOTE — Telephone Encounter (Signed)

## 2018-07-15 ENCOUNTER — Ambulatory Visit: Payer: Medicare Other | Admitting: Cardiology

## 2018-08-10 DIAGNOSIS — E871 Hypo-osmolality and hyponatremia: Secondary | ICD-10-CM | POA: Diagnosis not present

## 2018-08-10 DIAGNOSIS — D649 Anemia, unspecified: Secondary | ICD-10-CM | POA: Diagnosis not present

## 2018-08-10 LAB — BASIC METABOLIC PANEL
BUN: 22 — AB (ref 4–21)
Creatinine: 0.7 (ref 0.5–1.1)
Glucose: 105
Potassium: 4.3 (ref 3.4–5.3)
Sodium: 135 — AB (ref 137–147)

## 2018-08-13 ENCOUNTER — Encounter: Payer: Self-pay | Admitting: Internal Medicine

## 2018-09-08 ENCOUNTER — Non-Acute Institutional Stay: Payer: Medicare Other | Admitting: Internal Medicine

## 2018-09-08 ENCOUNTER — Ambulatory Visit
Admission: RE | Admit: 2018-09-08 | Discharge: 2018-09-08 | Disposition: A | Discharge status: Home / Self Care | Payer: Medicare Other | Source: Ambulatory Visit | Attending: Internal Medicine | Admitting: Internal Medicine

## 2018-09-08 ENCOUNTER — Other Ambulatory Visit: Payer: Self-pay | Admitting: Internal Medicine

## 2018-09-08 ENCOUNTER — Encounter: Payer: Self-pay | Admitting: Internal Medicine

## 2018-09-08 ENCOUNTER — Other Ambulatory Visit: Payer: Self-pay

## 2018-09-08 VITALS — BP 108/60 | HR 56 | Temp 98.1°F | Ht 61.0 in | Wt 109.0 lb

## 2018-09-08 DIAGNOSIS — M25552 Pain in left hip: Secondary | ICD-10-CM

## 2018-09-08 DIAGNOSIS — M1612 Unilateral primary osteoarthritis, left hip: Secondary | ICD-10-CM | POA: Diagnosis not present

## 2018-09-08 NOTE — Patient Instructions (Signed)
I put in an order for you to get xrays of your hip done at [facility].  Security can take you.  Please arrange. Please use tylenol up to a maximum of 3000mg  in a day for your pain. You may also use topicals from the pharmacy over-the-counter like aspercreme, biofreeze.   You can also try heat or ice on the area for pain relief.   Be sure to use your cane for balance so you do not fall and avoid doing too much walking right now until we know what's wrong.  I may order some therapy for you when I have more information.

## 2018-09-08 NOTE — Progress Notes (Signed)
Location:  [facility] Point of Service:  Clinic (12)  Provider: Errica Dutil L. Mariea Clonts, D.O., C.M.D.  Code Status: DNR Goals of Care:  Advanced Directives 09/15/2017  Does Patient Have a Medical Advance Directive? Yes  Type of Advance Directive [place]  Does patient want to make changes to medical advance directive? No - Patient declined  Copy of [place] in Chart? Yes  Would patient like information on creating a medical advance directive? -  Pre-existing out of facility DNR order (yellow form or pink MOST form) -     Chief Complaint  Patient presents with  . Acute Visit    left hip pain    HPI: Patient is a 85 y.o. female seen today for an acute visit for left hip pain.  Her son contacted me with a message last evening b/c she's struggling to walk.  She was walking 4 miles a day and then suddenly she could not.  It seems like it almost happened overnight.  Only painful when she tries to walk.  Does not hurt at rest.  Lateral part of pelvis and hip itself.  Pain does go down the outside of her leg a little bit.  Has not fallen.  No injury she's aware of.  Not excruciating.  She can't really rate it 1-10.  It's just a little painful.  She has begun to use an old cane.  It belonged to her husband's father.   She says there's a dagger inside it but she can't get it open if she wanted to defend herself with it anyway ;).    Past Medical History:  Diagnosis Date  . Acute upper respiratory infections of unspecified site 06/09/2011  . Breast cancer ([place]) 12/11/2008  . Fatigue 01/2012  . Hypercholesterolemia    on lipitor  . Hypertension   . Hyponatremia 01/2016  . Memory loss   . Mitral valve prolapse   . Other abnormal blood chemistry 01/21/2010  . Palpitations 08/05/2000  . Right bundle branch block   . Senile osteoporosis 09/28/2001    Past Surgical History:  Procedure Laterality Date  . BREAST LUMPECTOMY Right 12/27/2007  . LAPAROSCOPIC  LYSIS OF ADHESIONS  07/15/2006   [name]  . OTHER SURGICAL HISTORY    . TONSILLECTOMY    . TUBAL LIGATION  1963    No Known Allergies  Outpatient Encounter Medications as of 09/08/2018  Medication Sig  . atenolol (TENORMIN) 25 MG tablet Take 0.25 tablets by mouth daily.  . Cholecalciferol (VITAMIN D3) 1000 units CAPS Take 2,000 Units by mouth daily.   No facility-administered encounter medications on file as of 09/08/2018.     Review of Systems:  Review of Systems  Constitutional: Negative for chills, fever and malaise/fatigue.  Respiratory: Negative for shortness of breath.   Cardiovascular: Negative for chest pain, palpitations and leg swelling.  Gastrointestinal: Negative for abdominal pain.  Genitourinary: Negative for dysuria.  Musculoskeletal: Positive for joint pain. Negative for back pain, falls and myalgias.  Neurological: Negative for dizziness and loss of consciousness.  Endo/Heme/Allergies: Bruises/bleeds easily.  Psychiatric/Behavioral: Positive for memory loss. Negative for depression. The patient is nervous/anxious.     Health Maintenance  Topic Date Due  . TETANUS/TDAP  05/16/1943  . INFLUENZA VACCINE  08/07/2018  . DEXA SCAN  Completed  . PNA vac Low Risk Adult  Completed    Physical Exam: Vitals:   09/08/18 1035  BP: 108/60  Pulse: (!) 56  Temp:  98.1 F (36.7 C)  TempSrc: Oral  SpO2: 97%  Weight: 109 lb (49.4 kg)  Height: 5\' 1"  (1.549 m)   Body mass index is 20.6 kg/m. Physical Exam Vitals signs reviewed.  Constitutional:      Appearance: Normal appearance. She is normal weight.     Comments: Appears distressed when puts weight on left leg, using cane in right hand to balance  Cardiovascular:     Rate and Rhythm: Normal rate and regular rhythm.  Pulmonary:     Effort: Pulmonary effort is normal.     Breath sounds: Normal breath sounds.  Abdominal:     General: Bowel sounds are normal.  Musculoskeletal:        General: Tenderness present.      Comments: Tender just distal to left hip joint; painful to bear weight, limps; not painful with straight leg raise or abduction; not tender in groin or back  Skin:    General: Skin is warm and dry.  Neurological:     General: No focal deficit present.     Mental Status: She is alert and oriented to person, place, and time.     Cranial Nerves: No cranial nerve deficit.     Sensory: No sensory deficit.     Motor: No weakness.     Coordination: Coordination normal.     Gait: Gait abnormal.  Psychiatric:        Mood and Affect: Mood normal.     Labs reviewed: Basic Metabolic Panel: Recent Labs    10/05/17 1436 01/12/18 05/25/18 08/10/18  NA 136 139 137 135*  K 4.2 4.4 4.6 4.3  CL 100  --   --   --   CO2 28  --   --   --   GLUCOSE 93  --   --   --   BUN 24* 19 19 22*  CREATININE 0.83 0.7 0.6 0.7  CALCIUM 9.1  --   --   --    Liver Function Tests: Recent Labs    10/05/17 1436  AST 18  ALT 13  ALKPHOS 51  BILITOT 0.4  PROT 6.6  ALBUMIN 3.8   No results for input(s): LIPASE, AMYLASE in the last 8760 hours. No results for input(s): AMMONIA in the last 8760 hours. CBC: Recent Labs    10/05/17 1436 01/15/18  WBC 7.5 9.3  NEUTROABS 4.5  --   HGB 12.8 13.9  HCT 38.1 40  MCV 100.0  --   PLT PLATELET CLUMPS NOTED ON SMEAR, COUNT APPEARS ADEQUATE 211   Lipid Panel: No results for input(s): CHOL, HDL, LDLCALC, TRIG, CHOLHDL, LDLDIRECT in the last 8760 hours. Lab Results  Component Value Date   HGBA1C 5.9 (H) 01/23/2016    Assessment/Plan 1. 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 DG Hip Unilat W OR W/O Pelvis Min 4 Views Left    Standing Status:   Future    Standing Expiration Date:   11/08/2019    Order Specific Question:   Reason for Exam (SYMPTOM  OR DIAGNOSIS REQUIRED)    Answer:   new left hip pain in past few days, difficulty bearing weight, no known injury    Order Specific Question:   Preferred imaging location?    Answer:   [id number]    Order Specific Question:   Radiology Contrast Protocol - do NOT remove file path    Answer:   [email or web address]    Next appt:  12/01/2018--keep regular appt and return prn  Aum Caggiano L. Coriana Angello, D.O. [facility] [address], [place] [address] Cell Phone (Mon-Fri 8am-5pm):  [phone] On Call:  [phone] & follow prompts after 5pm & weekends Office Phone:  [phone] Office Fax:  [phone]

## 2018-09-21 DIAGNOSIS — M13852 Other specified arthritis, left hip: Secondary | ICD-10-CM | POA: Diagnosis not present

## 2018-09-21 DIAGNOSIS — M25652 Stiffness of left hip, not elsewhere classified: Secondary | ICD-10-CM | POA: Diagnosis not present

## 2018-09-21 DIAGNOSIS — R2689 Other abnormalities of gait and mobility: Secondary | ICD-10-CM | POA: Diagnosis not present

## 2018-09-21 DIAGNOSIS — M25552 Pain in left hip: Secondary | ICD-10-CM | POA: Diagnosis not present

## 2018-09-23 DIAGNOSIS — R2689 Other abnormalities of gait and mobility: Secondary | ICD-10-CM | POA: Diagnosis not present

## 2018-09-23 DIAGNOSIS — M13852 Other specified arthritis, left hip: Secondary | ICD-10-CM | POA: Diagnosis not present

## 2018-09-23 DIAGNOSIS — M25552 Pain in left hip: Secondary | ICD-10-CM | POA: Diagnosis not present

## 2018-09-23 DIAGNOSIS — M25652 Stiffness of left hip, not elsewhere classified: Secondary | ICD-10-CM | POA: Diagnosis not present

## 2018-09-27 DIAGNOSIS — M13852 Other specified arthritis, left hip: Secondary | ICD-10-CM | POA: Diagnosis not present

## 2018-09-27 DIAGNOSIS — M25652 Stiffness of left hip, not elsewhere classified: Secondary | ICD-10-CM | POA: Diagnosis not present

## 2018-09-27 DIAGNOSIS — R2689 Other abnormalities of gait and mobility: Secondary | ICD-10-CM | POA: Diagnosis not present

## 2018-09-27 DIAGNOSIS — M25552 Pain in left hip: Secondary | ICD-10-CM | POA: Diagnosis not present

## 2018-10-05 DIAGNOSIS — M25652 Stiffness of left hip, not elsewhere classified: Secondary | ICD-10-CM | POA: Diagnosis not present

## 2018-10-05 DIAGNOSIS — R2689 Other abnormalities of gait and mobility: Secondary | ICD-10-CM | POA: Diagnosis not present

## 2018-10-05 DIAGNOSIS — M13852 Other specified arthritis, left hip: Secondary | ICD-10-CM | POA: Diagnosis not present

## 2018-10-05 DIAGNOSIS — M25552 Pain in left hip: Secondary | ICD-10-CM | POA: Diagnosis not present

## 2018-10-12 DIAGNOSIS — M25552 Pain in left hip: Secondary | ICD-10-CM | POA: Diagnosis not present

## 2018-10-12 DIAGNOSIS — M13852 Other specified arthritis, left hip: Secondary | ICD-10-CM | POA: Diagnosis not present

## 2018-10-12 DIAGNOSIS — M25652 Stiffness of left hip, not elsewhere classified: Secondary | ICD-10-CM | POA: Diagnosis not present

## 2018-10-12 DIAGNOSIS — R2689 Other abnormalities of gait and mobility: Secondary | ICD-10-CM | POA: Diagnosis not present

## 2018-10-18 NOTE — Progress Notes (Signed)
Cynthia Watkins Date of Birth: 23-Jan-1924   History of Present Illness: Cynthia Watkins is seen for follow up. She has a history of hypertension, hyperlipidemia, and a right bundle branch block. She was seen in June 2016 with complaints of palpitations and feeling faint.  Event monitor showed no arrhythmia. Echo noted below. All antihypertensive therapy was discontinued. She was seen again last year with more palpitations. Event monitor showed frequent PACs and one short run of NSVT 7 beats. She was managed with low dose beta blocker.  In January 2018 she was admitted to the hospital after a Gi illness with acute encephalopathy and sodium level down to 107. She was treated with hypertonic saline and lasix. Sodium improved to 129. She was DC on atenolol 12.5 mg daily and lasix 20 mg daily. She returned to the ED Jan 29 with symptoms of SOB and lightheadedness. Sodium improved to 133. BNP 150. CXR showed no acute change. It was felt that she had a panic attack and was hyperventilating. She was orthostatic and her lasix was discontinued.   On follow up today she states she is doing well. She is at [facility] and is being Covid safe. No palpitations, chest pain or SOB. No increase in edema.   Current Outpatient Medications on File Prior to Visit  Medication Sig Dispense Refill  . atenolol (TENORMIN) 25 MG tablet Take 0.25 tablets by mouth daily.    . Cholecalciferol (VITAMIN D3) 1000 units CAPS Take 1,000 Units by mouth daily.      No current facility-administered medications on file prior to visit.     No Known Allergies  Past Medical History:  Diagnosis Date  . Acute upper respiratory infections of unspecified site 06/09/2011  . Breast cancer ([place]) 12/11/2008  . Fatigue 01/2012  . Hypercholesterolemia    on lipitor  . Hypertension   . Hyponatremia 01/2016  . Memory loss   . Mitral valve prolapse   . Other abnormal blood chemistry 01/21/2010  . Palpitations 08/05/2000  . Right bundle branch  block   . Senile osteoporosis 09/28/2001    Past Surgical History:  Procedure Laterality Date  . BREAST LUMPECTOMY Right 12/27/2007  . LAPAROSCOPIC LYSIS OF ADHESIONS  07/15/2006   [name]  . OTHER SURGICAL HISTORY    . TONSILLECTOMY    . TUBAL LIGATION  1963    Social History   Tobacco Use  Smoking Status Former Smoker  . Packs/day: 0.25  . Years: 10.00  . Pack years: 2.50  . Quit date: 08/06/1954  . Years since quitting: 24.2  Smokeless Tobacco Never Used    Social History   Substance and Sexual Activity  Alcohol Use Yes   Comment: Occasionally     Family History  Problem Relation Age of Onset  . Stroke Mother   . Cancer Father        colon  . Alzheimer's disease Sister     Review of Systems: As the history of present illness.  All other systems were reviewed and are negative.  Physical Exam: BP 106/80   Pulse (!) 59   Temp (!) 97.3 F (36.3 C) (Temporal)   Ht 5' (1.524 m)   Wt 112 lb (50.8 kg)   SpO2 97%   BMI 21.87 kg/m  GENERAL:  Well appearing elderly WF in NAD HEENT:  PERRL, EOMI, sclera are clear. Oropharynx is clear. NECK:  No jugular venous distention, carotid upstroke brisk and symmetric, no bruits, no thyromegaly or adenopathy LUNGS:  Clear to auscultation bilaterally CHEST:  Unremarkable HEART:  RRR,  PMI not displaced or sustained,S1 and S2 within normal limits, no S3, no S4: no clicks, no rubs, no murmurs ABD:  Soft, nontender. BS +, no masses or bruits. No hepatomegaly, no splenomegaly EXT:  2 + pulses throughout, no edema, no cyanosis no clubbing SKIN:  Warm and dry.  No rashes NEURO:  Alert and oriented x 3. Cranial nerves II through XII intact. PSYCH:  Cognitively intact    LABORATORY DATA: Lab Results  Component Value Date   WBC 9.3 01/15/2018   HGB 13.9 01/15/2018   HCT 40 01/15/2018   PLT 211 01/15/2018   GLUCOSE 93 10/05/2017   CHOL 214 (H) 01/27/2016   TRIG 96 01/27/2016   HDL 62 01/27/2016   LDLCALC 133 (H)  01/27/2016   ALT 13 10/05/2017   AST 18 10/05/2017   NA 135 (A) 08/10/2018   K 4.3 08/10/2018   CL 100 10/05/2017   CREATININE 0.7 08/10/2018   BUN 22 (A) 08/10/2018   CO2 28 10/05/2017   TSH 2.10 09/09/2016   INR 1.07 01/23/2016   HGBA1C 5.9 (H) 01/23/2016   Echo 02/21/16: Study Conclusions  - Left ventricle: The cavity size was normal. Wall thickness was   normal. Features are consistent with a pseudonormal left   ventricular filling pattern, with concomitant abnormal relaxation   and increased filling pressure (grade 2 diastolic dysfunction). - Aortic valve: Mildly to moderately calcified annulus. There was   mild regurgitation. Valve area (VTI): 1.19 cm^2. Valve area   (Vmax): 1.19 cm^2. Valve area (Vmean): 1.29 cm^2. - Tricuspid valve: There was mild-moderate regurgitation. - Pulmonary arteries: Systolic pressure was mildly increased. PA   peak pressure: 36 mm Hg (S).  Ecg today shows NSR rate 59. RBBB. I have personally reviewed and interpreted this study.  Assessment / Plan: 1.  Hyponatremia. Probably related to GI illness and excessive po water intake. Now corrected. 135.   2. Right bundle branch block, chronic.  3. History of HTN- now well controlled on minimal therapy  4. History of palpitations. Controlled on low dose atenolol.   we will continue current therapy and follow up in one year.

## 2018-10-19 DIAGNOSIS — M13852 Other specified arthritis, left hip: Secondary | ICD-10-CM | POA: Diagnosis not present

## 2018-10-19 DIAGNOSIS — M25652 Stiffness of left hip, not elsewhere classified: Secondary | ICD-10-CM | POA: Diagnosis not present

## 2018-10-19 DIAGNOSIS — M25552 Pain in left hip: Secondary | ICD-10-CM | POA: Diagnosis not present

## 2018-10-19 DIAGNOSIS — R2689 Other abnormalities of gait and mobility: Secondary | ICD-10-CM | POA: Diagnosis not present

## 2018-10-20 ENCOUNTER — Encounter: Payer: Self-pay | Admitting: Cardiology

## 2018-10-20 ENCOUNTER — Ambulatory Visit: Payer: Medicare Other | Admitting: Cardiology

## 2018-10-20 ENCOUNTER — Other Ambulatory Visit: Payer: Self-pay

## 2018-10-20 VITALS — BP 106/80 | HR 59 | Temp 97.3°F | Ht 60.0 in | Wt 112.0 lb

## 2018-10-20 DIAGNOSIS — R002 Palpitations: Secondary | ICD-10-CM | POA: Diagnosis not present

## 2018-10-20 DIAGNOSIS — I10 Essential (primary) hypertension: Secondary | ICD-10-CM | POA: Diagnosis not present

## 2018-10-20 DIAGNOSIS — I4510 Unspecified right bundle-branch block: Secondary | ICD-10-CM

## 2018-11-11 ENCOUNTER — Other Ambulatory Visit: Payer: Self-pay

## 2018-11-11 ENCOUNTER — Ambulatory Visit (INDEPENDENT_AMBULATORY_CARE_PROVIDER_SITE_OTHER): Payer: Medicare Other | Admitting: Nurse Practitioner

## 2018-11-11 ENCOUNTER — Encounter: Payer: Self-pay | Admitting: Nurse Practitioner

## 2018-11-11 VITALS — Ht 60.0 in | Wt 112.0 lb

## 2018-11-11 DIAGNOSIS — Z0000 Encounter for general adult medical examination without abnormal findings: Secondary | ICD-10-CM

## 2018-11-11 MED ORDER — TETANUS-DIPHTH-ACELL PERTUSSIS 5-2.5-18.5 LF-MCG/0.5 IM SUSP: 0.5000 mL | Freq: Once | INTRAMUSCULAR | 0 refills | Status: AC

## 2018-11-11 NOTE — Patient Instructions (Signed)
Cynthia Watkins , Thank you for taking time to come for your Medicare Wellness Visit. I appreciate your ongoing commitment to your health goals. Please review the following plan we discussed and let me know if I can assist you in the future.   Screening recommendations/referrals: Colonoscopy aged out Mammogram aged out Bone Density up to date Recommended yearly ophthalmology/optometry visit for glaucoma screening and checkup Recommended yearly dental visit for hygiene and checkup  Vaccinations: Influenza vaccine up to date Pneumococcal vaccine up to date Tdap vaccine Rx sent to pharmacy  Shingles vaccine up to date   Advanced directives: on file  Conditions/risks identified: none.  Next appointment: 1 year   Preventive Care 2 Years and Older, Female Preventive care refers to lifestyle choices and visits with your health care provider that can promote health and wellness. What does preventive care include?  A yearly physical exam. This is also called an annual well check.  Dental exams once or twice a year.  Routine eye exams. Ask your health care provider how often you should have your eyes checked.  Personal lifestyle choices, including:  Daily care of your teeth and gums.  Regular physical activity.  Eating a healthy diet.  Avoiding tobacco and drug use.  Limiting alcohol use.  Practicing safe sex.  Taking low-dose aspirin every day.  Taking vitamin and mineral supplements as recommended by your health care provider. What happens during an annual well check? The services and screenings done by your health care provider during your annual well check will depend on your age, overall health, lifestyle risk factors, and family history of disease. Counseling  Your health care provider may ask you questions about your:  Alcohol use.  Tobacco use.  Drug use.  Emotional well-being.  Home and relationship well-being.  Sexual activity.  Eating habits.  History  of falls.  Memory and ability to understand (cognition).  Work and work Statistician.  Reproductive health. Screening  You may have the following tests or measurements:  Height, weight, and BMI.  Blood pressure.  Lipid and cholesterol levels. These may be checked every 5 years, or more frequently if you are over 61 years old.  Skin check.  Lung cancer screening. You may have this screening every year starting at age 66 if you have a 30-pack-year history of smoking and currently smoke or have quit within the past 15 years.  Fecal occult blood test (FOBT) of the stool. You may have this test every year starting at age 81.  Flexible sigmoidoscopy or colonoscopy. You may have a sigmoidoscopy every 5 years or a colonoscopy every 10 years starting at age 31.  Hepatitis C blood test.  Hepatitis B blood test.  Sexually transmitted disease (STD) testing.  Diabetes screening. This is done by checking your blood sugar (glucose) after you have not eaten for a while (fasting). You may have this done every 1-3 years.  Bone density scan. This is done to screen for osteoporosis. You may have this done starting at age 65.  Mammogram. This may be done every 1-2 years. Talk to your health care provider about how often you should have regular mammograms. Talk with your health care provider about your test results, treatment options, and if necessary, the need for more tests. Vaccines  Your health care provider may recommend certain vaccines, such as:  Influenza vaccine. This is recommended every year.  Tetanus, diphtheria, and acellular pertussis (Tdap, Td) vaccine. You may need a Td booster every 10 years.  Zoster  vaccine. You may need this after age 53.  Pneumococcal 13-valent conjugate (PCV13) vaccine. One dose is recommended after age 68.  Pneumococcal polysaccharide (PPSV23) vaccine. One dose is recommended after age 43. Talk to your health care provider about which screenings and  vaccines you need and how often you need them. This information is not intended to replace advice given to you by your health care provider. Make sure you discuss any questions you have with your health care provider. Document Released: 01/19/2015 Document Revised: 09/12/2015 Document Reviewed: 10/24/2014 Elsevier Interactive Patient Education  2017 [place] Prevention in the Home Falls can cause injuries. They can happen to people of all ages. There are many things you can do to make your home safe and to help prevent falls. What can I do on the outside of my home?  Regularly fix the edges of walkways and driveways and fix any cracks.  Remove anything that might make you trip as you walk through a door, such as a raised step or threshold.  Trim any bushes or trees on the path to your home.  Use bright outdoor lighting.  Clear any walking paths of anything that might make someone trip, such as rocks or tools.  Regularly check to see if handrails are loose or broken. Make sure that both sides of any steps have handrails.  Any raised decks and porches should have guardrails on the edges.  Have any leaves, snow, or ice cleared regularly.  Use sand or salt on walking paths during winter.  Clean up any spills in your garage right away. This includes oil or grease spills. What can I do in the bathroom?  Use night lights.  Install grab bars by the toilet and in the tub and shower. Do not use towel bars as grab bars.  Use non-skid mats or decals in the tub or shower.  If you need to sit down in the shower, use a plastic, non-slip stool.  Keep the floor dry. Clean up any water that spills on the floor as soon as it happens.  Remove soap buildup in the tub or shower regularly.  Attach bath mats securely with double-sided non-slip rug tape.  Do not have throw rugs and other things on the floor that can make you trip. What can I do in the bedroom?  Use night lights.   Make sure that you have a light by your bed that is easy to reach.  Do not use any sheets or blankets that are too big for your bed. They should not hang down onto the floor.  Have a firm chair that has side arms. You can use this for support while you get dressed.  Do not have throw rugs and other things on the floor that can make you trip. What can I do in the kitchen?  Clean up any spills right away.  Avoid walking on wet floors.  Keep items that you use a lot in easy-to-reach places.  If you need to reach something above you, use a strong step stool that has a grab bar.  Keep electrical cords out of the way.  Do not use floor polish or wax that makes floors slippery. If you must use wax, use non-skid floor wax.  Do not have throw rugs and other things on the floor that can make you trip. What can I do with my stairs?  Do not leave any items on the stairs.  Make sure that there are handrails  on both sides of the stairs and use them. Fix handrails that are broken or loose. Make sure that handrails are as long as the stairways.  Check any carpeting to make sure that it is firmly attached to the stairs. Fix any carpet that is loose or worn.  Avoid having throw rugs at the top or bottom of the stairs. If you do have throw rugs, attach them to the floor with carpet tape.  Make sure that you have a light switch at the top of the stairs and the bottom of the stairs. If you do not have them, ask someone to add them for you. What else can I do to help prevent falls?  Wear shoes that:  Do not have high heels.  Have rubber bottoms.  Are comfortable and fit you well.  Are closed at the toe. Do not wear sandals.  If you use a stepladder:  Make sure that it is fully opened. Do not climb a closed stepladder.  Make sure that both sides of the stepladder are locked into place.  Ask someone to hold it for you, if possible.  Clearly mark and make sure that you can see:  Any  grab bars or handrails.  First and last steps.  Where the edge of each step is.  Use tools that help you move around (mobility aids) if they are needed. These include:  Canes.  Walkers.  Scooters.  Crutches.  Turn on the lights when you go into a dark area. Replace any light bulbs as soon as they burn out.  Set up your furniture so you have a clear path. Avoid moving your furniture around.  If any of your floors are uneven, fix them.  If there are any pets around you, be aware of where they are.  Review your medicines with your doctor. Some medicines can make you feel dizzy. This can increase your chance of falling. Ask your doctor what other things that you can do to help prevent falls. This information is not intended to replace advice given to you by your health care provider. Make sure you discuss any questions you have with your health care provider. Document Released: 10/19/2008 Document Revised: 05/31/2015 Document Reviewed: 01/27/2014 Elsevier Interactive Patient Education  2017 [employer].

## 2018-11-11 NOTE — Progress Notes (Signed)
Subjective:   Cynthia Watkins is a 85 y.o. female who presents for Medicare Annual (Subsequent) preventive examination.  Review of Systems:   Cardiac Risk Factors include: advanced age (>24men, >59 women);dyslipidemia;hypertension     Objective:     Vitals: Ht 5' (1.524 m)   Wt 112 lb (50.8 kg)   BMI 21.87 kg/m   Body mass index is 21.87 kg/m.  Advanced Directives 09/15/2017 05/13/2017 12/24/2016 08/27/2016 08/06/2016 05/21/2016 02/20/2016  Does Patient Have a Medical Advance Directive? Yes Yes Yes Yes Yes Yes Yes  Type of [place] administrator Power of [place] of [place];Living will [place] of facility DNR (pink MOST or yellow form);Healthcare Power of Attorney  Does patient want to make changes to medical advance directive? No - Patient declined No - Patient declined No - Patient declined - No - Patient declined - -  Copy of [place] in Chart? Yes Yes Yes Yes Yes Yes -  Would patient like information on creating a medical advance directive? - - - - - - -  Pre-existing out of facility DNR order (yellow form or pink MOST form) - - - - - - Yellow form placed in chart (order not valid for inpatient use)    Tobacco Social History   Tobacco Use  Smoking Status Former Smoker  . Packs/day: 0.25  . Years: 10.00  . Pack years: 2.50  . Quit date: 08/06/1954  . Years since quitting: 64.3  Smokeless Tobacco Never Used     Counseling given: Not Answered   Clinical Intake:  Pre-visit preparation completed: Yes  Pain : No/denies pain     BMI - recorded: 21.87 Nutritional Status: BMI of 19-24  Normal Nutritional Risks: None  How often do you need to have someone help you when you read instructions, pamphlets, or other written materials from your doctor or pharmacy?: 1 - Never What is the last grade level you completed in  school?: phd  Interpreter Needed?: No     Past Medical History:  Diagnosis Date  . Acute upper respiratory infections of unspecified site 06/09/2011  . Breast cancer ([place]) 12/11/2008  . Fatigue 01/2012  . Hypercholesterolemia    on lipitor  . Hypertension   . Hyponatremia 01/2016  . Memory loss   . Mitral valve prolapse   . Other abnormal blood chemistry 01/21/2010  . Palpitations 08/05/2000  . Right bundle branch block   . Senile osteoporosis 09/28/2001   Past Surgical History:  Procedure Laterality Date  . BREAST LUMPECTOMY Right 12/27/2007  . LAPAROSCOPIC LYSIS OF ADHESIONS  07/15/2006   [name]  . OTHER SURGICAL HISTORY    . TONSILLECTOMY    . TUBAL LIGATION  1963   Family History  Problem Relation Age of Onset  . Stroke Mother   . Cancer Father        colon  . Alzheimer's disease Sister    Social History   Socioeconomic History  . Marital status: Widowed    Spouse name: Not on file  . Number of children: 3  . Years of education: Not on file  . Highest education level: Not on file  Occupational History  . Occupation: Retired Product manager: RETIRED  Social Needs  . Financial resource strain: Not hard at all  . Food insecurity    Worry: Never true    Inability: Never true  .  Transportation needs    Medical: No    Non-medical: No  Tobacco Use  . Smoking status: Former Smoker    Packs/day: 0.25    Years: 10.00    Pack years: 2.50    Quit date: 08/06/1954    Years since quitting: 64.3  . Smokeless tobacco: Never Used  Substance and Sexual Activity  . Alcohol use: Yes    Comment: Occasionally   . Drug use: No  . Sexual activity: Never  Lifestyle  . Physical activity    Days per week: 7 days    Minutes per session: 120 min  . Stress: Not at all  Relationships  . Social connections    Talks on phone: More than three times a week    Gets together: More than three times a week    Attends religious service: Never    Active member of club or  organization: No    Attends meetings of clubs or organizations: Never    Relationship status: Widowed  Other Topics Concern  . Not on file  Social History Narrative   Lives at [facility] since 05/1998   Widowed   Living will   Former smoker, stopped 1956   Alcohol  Rare   Exercise - walk one hour daily 7 days a week,  3 days of stretching and strengthen, yoga 45 minutes 2 days a week    Outpatient Encounter Medications as of 11/11/2018  Medication Sig  . atenolol (TENORMIN) 25 MG tablet Take 0.25 tablets by mouth daily.  . Cholecalciferol (VITAMIN D3) 1000 units CAPS Take 1,000 Units by mouth daily.   . Tdap (BOOSTRIX) 5-2.5-18.5 LF-MCG/0.5 injection Inject 0.5 mLs into the muscle once for 1 dose.  . [DISCONTINUED] Tdap (BOOSTRIX) 5-2.5-18.5 LF-MCG/0.5 injection Inject 0.5 mLs into the muscle once.   No facility-administered encounter medications on file as of 11/11/2018.     Activities of Daily Living In your present state of health, do you have any difficulty performing the following activities: 11/11/2018  Hearing? Y  Vision? N  Difficulty concentrating or making decisions? N  Walking or climbing stairs? N  Dressing or bathing? N  Doing errands, shopping? N  Preparing Food and eating ? N  Using the Toilet? N  In the past six months, have you accidently leaked urine? N  Do you have problems with loss of bowel control? N  Managing your Medications? N  Managing your Finances? N  Housekeeping or managing your Housekeeping? N  Some recent data might be hidden    Patient Care Team: Gayland Curry, DO as PCP - General (Geriatric Medicine) Prentiss Bells, MD as Consulting Physician (Ophthalmology) [place], Peter M, MD as Consulting Physician (Cardiology) Allyn Kenner, MD as Consulting Physician (Dermatology) Michael Boston, MD as Consulting Physician (General Surgery) [place], Well Loreli Slot, MD as Consulting Physician (Hematology and Oncology) Magrinat,  Virgie Dad, MD as Consulting Physician (Oncology) [name], MD as Consulting Physician (Ophthalmology)    Assessment:   This is a routine wellness examination for Cynthia Watkins.  Exercise Activities and Dietary recommendations Current Exercise Habits: Home exercise routine;Structured exercise class, Type of exercise: walking;calisthenics;strength training/weights;stretching, Time (Minutes): 60, Frequency (Times/Week): 7, Weekly Exercise (Minutes/Week): 420, Intensity: Moderate  Goals    . increase water     Patient will increase water and herbal tea intake       Fall Risk Fall Risk  11/11/2018 06/02/2018 02/03/2018 09/15/2017 09/15/2017  Falls in the past year? 1 1 0 No No  Number falls in past yr: 0 0 0 - -  Injury with Fall? 1 0 0 - -  Comment Had PT, but no Fx. - - - -   Is the patient's home free of loose throw rugs in walkways, pet beds, electrical cords, etc?   yes      Grab bars in the bathroom? yes      Handrails on the stairs?   No stairs       Adequate lighting?   yes  Timed Get Up and Go performed: na  Depression Screen PHQ 2/9 Scores 11/11/2018 06/02/2018 02/03/2018 09/15/2017  PHQ - 2 Score 0 0 0 0     Cognitive Function MMSE - Mini Mental State Exam 09/15/2017 08/06/2016 08/15/2015 02/14/2014  Orientation to time 4 5 4 5   Orientation to Place 5 5 5 5   Registration 3 3 3 3   Attention/ Calculation 5 5 5 5   Recall 3 3 3 3   Language- name 2 objects 2 2 2 2   Language- repeat 1 1 1 1   Language- follow 3 step command 3 3 3 3   Language- read & follow direction 1 1 1 1   Write a sentence 1 1 1 1   Copy design 1 1 1 1   Total score 29 30 29 30      6CIT Screen 11/11/2018  What Year? 0 points  What month? 0 points  What time? 0 points  Count back from 20 0 points  Months in reverse 0 points  Repeat phrase 0 points  Total Score 0    Immunization History  Administered Date(s) Administered  . Influenza Split 10/23/2011  . Influenza, High Dose Seasonal PF 09/20/2015,  10/09/2016, 10/15/2018  . Influenza,inj,Quad PF,6+ Mos 10/28/2013, 10/30/2017  . Influenza-Unspecified 10/06/2012, 10/26/2014  . Pneumococcal Conjugate-13 02/14/2014  . Pneumococcal Polysaccharide-23 01/06/1997  . Zoster 01/07/1999  . Zoster Recombinat (Shingrix) 10/09/2016, 09/17/2017, 01/14/2018    Qualifies for Shingles Vaccine?up to date  Screening Tests Health Maintenance  Topic Date Due  . TETANUS/TDAP  05/16/1943  . INFLUENZA VACCINE  Completed  . DEXA SCAN  Completed  . PNA vac Low Risk Adult  Completed    Cancer Screenings: Lung: Low Dose CT Chest recommended if Age 12-80 years, 30 pack-year currently smoking OR have quit w/in 15years. Patient does not qualify. Breast:  Up to date on Mammogram? Yes   Up to date of Bone Density/Dexa? Yes Colorectal: aged out  Additional Screenings:  Hepatitis C Screening: aged      Plan:      I have personally reviewed and noted the following in the patient's chart:   . Medical and social history . Use of alcohol, tobacco or illicit drugs  . Current medications and supplements . Functional ability and status . Nutritional status . Physical activity . Advanced directives . List of other physicians . Hospitalizations, surgeries, and ER visits in previous 12 months . Vitals . Screenings to include cognitive, depression, and falls . Referrals and appointments  In addition, I have reviewed and discussed with patient certain preventive protocols, quality metrics, and best practice recommendations. A written personalized care plan for preventive services as well as general preventive health recommendations were provided to patient.     Lauree Chandler, NP  11/11/2018

## 2018-11-11 NOTE — Progress Notes (Signed)
This service is provided via telemedicine  No vital signs collected/recorded due to the encounter was a telemedicine visit.   Location of patient (ex: home, work):  Home  Patient consents to a telephone visit:  Yes  Location of the provider (ex: office, home):  [facility]  Name of any referring provider:  [name], DO  Names of all persons participating in the telemedicine service and their role in the encounter:  Bonney Leitz, [place]; Sherrie Mustache, NP; patient.   Time spent on call:  4.42 minutes -  CMA time only

## 2018-11-24 ENCOUNTER — Other Ambulatory Visit: Payer: Self-pay | Admitting: Cardiology

## 2018-11-24 NOTE — Telephone Encounter (Signed)
Received fax from [facility] for request to refill Atenolol for pt. Pt is taking 1/4 of a 25 mg tablet daily.   The pharmacy is asking if patient's directions have changed. Pt is asking them to cut the tablets into 1/4 tablets which they said is impossible.   Pt directions currently are to take 1/2 tab daily.  Please advise.  Thanks!

## 2018-11-25 ENCOUNTER — Telehealth: Payer: Self-pay

## 2018-11-25 ENCOUNTER — Other Ambulatory Visit: Payer: Self-pay

## 2018-11-25 MED ORDER — ATENOLOL 25 MG PO TABS: ORAL_TABLET | ORAL | 3 refills | Status: DC

## 2018-11-25 NOTE — Telephone Encounter (Signed)
Spoke to patient she stated she has been taking Atenolol 25 mg 1/4 tablet daily.Advised I will send in refill to her pharmacy.

## 2018-12-01 ENCOUNTER — Encounter: Payer: Self-pay | Admitting: Internal Medicine

## 2018-12-01 ENCOUNTER — Non-Acute Institutional Stay: Payer: Medicare Other | Admitting: Internal Medicine

## 2018-12-01 ENCOUNTER — Other Ambulatory Visit: Payer: Self-pay

## 2018-12-01 VITALS — BP 118/64 | HR 67 | Temp 97.3°F | Ht 60.0 in | Wt 113.8 lb

## 2018-12-01 DIAGNOSIS — E7800 Pure hypercholesterolemia, unspecified: Secondary | ICD-10-CM

## 2018-12-01 DIAGNOSIS — R739 Hyperglycemia, unspecified: Secondary | ICD-10-CM

## 2018-12-01 DIAGNOSIS — M810 Age-related osteoporosis without current pathological fracture: Secondary | ICD-10-CM | POA: Diagnosis not present

## 2018-12-01 DIAGNOSIS — I10 Essential (primary) hypertension: Secondary | ICD-10-CM

## 2018-12-01 DIAGNOSIS — E871 Hypo-osmolality and hyponatremia: Secondary | ICD-10-CM

## 2018-12-01 DIAGNOSIS — Z66 Do not resuscitate: Secondary | ICD-10-CM

## 2018-12-01 DIAGNOSIS — M25552 Pain in left hip: Secondary | ICD-10-CM

## 2018-12-01 NOTE — Progress Notes (Signed)
Location:  [facility] of Service:  Clinic (12)  Provider: Dilynn Munroe L. Mariea Clonts, D.O., C.M.D.  Code Status: DNR Goals of Care:  Advanced Directives 09/15/2017  Does Patient Have a Medical Advance Directive? Yes  Type of Advance Directive [place]  Does patient want to make changes to medical advance directive? No - Patient declined  Copy of [place] in Chart? Yes  Would patient like information on creating a medical advance directive? -  Pre-existing out of facility DNR order (yellow form or pink MOST form) -   Chief Complaint  Patient presents with  . Medical Management of Chronic Issues    6 Month Follow up    HPI: Patient is a 85 y.o. [place] seen today for medical management of chronic diseases.    She's not had a recent sodium.    She did well on her 6CIT on the phone during her wellness visit.    No pain anymore in her left hip.  She worked with therapy and she's doing better since.   Still walking.  Still eating her walnuts, blueberries and yogurt.    Blood pressure is great.  No dizziness or lightheadedness lately.    Wt is up 1.8 lbs.  Appetite is "too good" she reports--her weight is going up a little with the ice cream.    She is staying here for thanksgiving.  She does not see the point in taking a risk.    Past Medical History:  Diagnosis Date  . Acute upper respiratory infections of unspecified site 06/09/2011  . Breast cancer ([place]) 12/11/2008  . Fatigue 01/2012  . Hypercholesterolemia    on lipitor  . Hypertension   . Hyponatremia 01/2016  . Memory loss   . Mitral valve prolapse   . Other abnormal blood chemistry 01/21/2010  . Palpitations 08/05/2000  . Right bundle branch block   . Senile osteoporosis 09/28/2001    Past Surgical History:  Procedure Laterality Date  . BREAST LUMPECTOMY Right 12/27/2007  . LAPAROSCOPIC LYSIS OF ADHESIONS  07/15/2006   [name]  . OTHER SURGICAL HISTORY     . TONSILLECTOMY    . TUBAL LIGATION  1963    No Known Allergies  Outpatient Encounter Medications as of 12/01/2018  Medication Sig  . atenolol (TENORMIN) 25 MG tablet Take 1/4 tablet daily  . Cholecalciferol (VITAMIN D3) 1000 units CAPS Take 1,000 Units by mouth daily.    No facility-administered encounter medications on file as of 12/01/2018.     Review of Systems:  Review of Systems  Constitutional: Negative for chills, fever and malaise/fatigue.  HENT: Negative for congestion and sore throat.        Seems to have slight hearing loss  Eyes: Negative for blurred vision.       Glasses  Respiratory: Negative for cough and shortness of breath.   Cardiovascular: Negative for chest pain, palpitations and leg swelling.  Gastrointestinal: Negative for abdominal pain, blood in stool, constipation, diarrhea and melena.  Genitourinary: Positive for frequency. Negative for dysuria, hematuria and urgency.  Musculoskeletal: Negative for falls and joint pain.       Hip is better  Skin: Negative for itching and rash.  Neurological: Negative for dizziness and loss of consciousness.  Psychiatric/Behavioral: Positive for memory loss. Negative for depression. The patient is nervous/anxious. The patient does not have insomnia.     Health Maintenance  Topic Date Due  . TETANUS/TDAP  05/16/1943  .  INFLUENZA VACCINE  Completed  . DEXA SCAN  Completed  . PNA vac Low Risk Adult  Completed    Physical Exam: Vitals:   12/01/18 1430  BP: 118/64  Pulse: 67  Temp: (!) 97.3 F (36.3 C)  TempSrc: Oral  SpO2: 96%  Weight: 113 lb 12.8 oz (51.6 kg)  Height: 5' (1.524 m)   Body mass index is 22.23 kg/m. Physical Exam Vitals signs reviewed.  Constitutional:      General: She is not in acute distress.    Appearance: Normal appearance. She is not toxic-appearing.  HENT:     Head: Normocephalic and atraumatic.  Eyes:     Extraocular Movements: Extraocular movements intact.     Pupils: Pupils  are equal, round, and reactive to light.     Comments: glasses  Cardiovascular:     Rate and Rhythm: Normal rate and regular rhythm.     Pulses: Normal pulses.     Heart sounds: Normal heart sounds.  Pulmonary:     Effort: Pulmonary effort is normal.     Breath sounds: Normal breath sounds. No wheezing, rhonchi or rales.  Abdominal:     General: Bowel sounds are normal.  Musculoskeletal: Normal range of motion.  Skin:    General: Skin is warm and dry.  Neurological:     General: No focal deficit present.     Mental Status: She is alert and oriented to person, place, and time.  Psychiatric:        Mood and Affect: Mood normal.     Comments: Seems slightly anxious     Labs reviewed: Basic Metabolic Panel: Recent Labs    01/12/18 05/25/18 08/10/18  NA 139 137 135*  K 4.4 4.6 4.3  BUN 19 19 22*  CREATININE 0.7 0.6 0.7   Liver Function Tests: No results for input(s): AST, ALT, ALKPHOS, BILITOT, PROT, ALBUMIN in the last 8760 hours. No results for input(s): LIPASE, AMYLASE in the last 8760 hours. No results for input(s): AMMONIA in the last 8760 hours. CBC: Recent Labs    01/15/18  WBC 9.3  HGB 13.9  HCT 40  PLT 211   Lipid Panel: No results for input(s): CHOL, HDL, LDLCALC, TRIG, CHOLHDL, LDLDIRECT in the last 8760 hours. Lab Results  Component Value Date   HGBA1C 5.9 (H) 01/23/2016    Assessment/Plan 1. Senile osteoporosis -continues on vitamin D, did not want to pursue other therapies for her bones, previously received zoledronic acid several years ago  2. Essential hypertension, benign -bp at goal with current regimen and w/o dizziness  3. Hyponatremia -Na has been stable, will recheck before next visit in 3 mos  4. Hyperglycemia -not recently a concern Lab Results  Component Value Date   HGBA1C 5.9 (H) 01/23/2016    5. Hypercholesterolemia -bad cholesterol was high a couple of years ago, but I'm more concerned at this stage that she may not eat  enough--her ice cream likely contributes to this value, but she needs more cushioning at 89 in case she should fall and more reserves if she should get ill Lab Results  Component Value Date   CHOL 214 (H) 01/27/2016   HDL 62 01/27/2016   LDLCALC 133 (H) 01/27/2016   TRIG 96 01/27/2016   CHOLHDL 3.5 01/27/2016    6. Left hip pain -better after PT and she reports continuing to faithfully do her exercises and her walking  Labs/tests ordered:  Cbc with diff, cmp, flp before Next appt:  02/23/2019  Kaileah Shevchenko L. Lochlyn Zullo, D.O. [facility] [address], [place] [address] Cell Phone (Mon-Fri 8am-5pm):  [phone] On Call:  [phone] & follow prompts after 5pm & weekends Office Phone:  [phone] Office Fax:  [phone]

## 2018-12-01 NOTE — Addendum Note (Signed)
Addended by: Gayland Curry on: 12/01/2018 03:10 PM   Modules accepted: Orders

## 2018-12-21 DIAGNOSIS — M545 Low back pain: Secondary | ICD-10-CM | POA: Diagnosis not present

## 2019-01-14 DIAGNOSIS — M25652 Stiffness of left hip, not elsewhere classified: Secondary | ICD-10-CM | POA: Diagnosis not present

## 2019-01-14 DIAGNOSIS — M545 Low back pain: Secondary | ICD-10-CM | POA: Diagnosis not present

## 2019-01-14 DIAGNOSIS — M25651 Stiffness of right hip, not elsewhere classified: Secondary | ICD-10-CM | POA: Diagnosis not present

## 2019-01-14 DIAGNOSIS — R2689 Other abnormalities of gait and mobility: Secondary | ICD-10-CM | POA: Diagnosis not present

## 2019-01-14 DIAGNOSIS — R293 Abnormal posture: Secondary | ICD-10-CM | POA: Diagnosis not present

## 2019-01-14 DIAGNOSIS — M25552 Pain in left hip: Secondary | ICD-10-CM | POA: Diagnosis not present

## 2019-01-14 DIAGNOSIS — M810 Age-related osteoporosis without current pathological fracture: Secondary | ICD-10-CM | POA: Diagnosis not present

## 2019-01-19 ENCOUNTER — Encounter: Payer: Self-pay | Admitting: Internal Medicine

## 2019-01-19 ENCOUNTER — Other Ambulatory Visit: Payer: Self-pay

## 2019-01-19 ENCOUNTER — Non-Acute Institutional Stay: Payer: Medicare Other | Admitting: Internal Medicine

## 2019-01-19 VITALS — BP 118/72 | HR 70 | Temp 97.5°F | Ht 61.0 in | Wt 111.1 lb

## 2019-01-19 DIAGNOSIS — I472 Ventricular tachycardia: Secondary | ICD-10-CM | POA: Insufficient documentation

## 2019-01-19 DIAGNOSIS — M533 Sacrococcygeal disorders, not elsewhere classified: Secondary | ICD-10-CM

## 2019-01-19 DIAGNOSIS — R2681 Unsteadiness on feet: Secondary | ICD-10-CM

## 2019-01-19 DIAGNOSIS — G3184 Mild cognitive impairment, so stated: Secondary | ICD-10-CM

## 2019-01-19 DIAGNOSIS — I4729 Other ventricular tachycardia: Secondary | ICD-10-CM | POA: Insufficient documentation

## 2019-01-19 MED ORDER — CELECOXIB 100 MG PO CAPS: 100.0000 mg | ORAL_CAPSULE | Freq: Every day | ORAL | 1 refills | Status: DC

## 2019-01-19 NOTE — Progress Notes (Signed)
Location:  [facility] of Service:  Clinic (12)  Provider: Kahron Kauth L. Mariea Clonts, D.O., C.M.D.  Code Status: DNR Goals of Care:  Advanced Directives 01/19/2019  Does Patient Have a Medical Advance Directive? Yes  Type of Advance Directive Out of facility DNR (pink MOST or yellow form);Healthcare Power of Attorney  Does patient want to make changes to medical advance directive? No - Patient declined  Copy of [place] in Chart? -  Would patient like information on creating a medical advance directive? -  Pre-existing out of facility DNR order (yellow form or pink MOST form) -     Chief Complaint  Patient presents with  . Acute Visit     Hip pain     HPI: Patient is a 86 y.o. female seen today for an acute visit for left hip/back pain.  Her daughter-in-law brought her groceries last week and noted she was walking with more of a shuffling gait.  She had not c/o anything to her or her son, but today says she needs some medicine for pain.  She had a course of PT previously for her discomfort which was helpful and did see Dr. Gladstone Lighter 12/21/18--note is still not scanned.  I had also ordered hip and pelvis xrays that revealed mild to moderate OA of the left hip (that was in sept).    Pain is currently in left SI region and she reports it's an awareness it's there not truly a pain.    She's been taking tylenol she thinks the regular kind every 4 hours.  Sometimes does even take it when she gets up to urinate overnight--she sometimes gets up more than once, also.    No falls.    Past Medical History:  Diagnosis Date  . Acute upper respiratory infections of unspecified site 06/09/2011  . Breast cancer ([place]) 12/11/2008  . Fatigue 01/2012  . Hypercholesterolemia    on lipitor  . Hypertension   . Hyponatremia 01/2016  . Memory loss   . Mitral valve prolapse   . Other abnormal blood chemistry 01/21/2010  . Palpitations 08/05/2000  . Right bundle branch block   .  Senile osteoporosis 09/28/2001    Past Surgical History:  Procedure Laterality Date  . BREAST LUMPECTOMY Right 12/27/2007  . LAPAROSCOPIC LYSIS OF ADHESIONS  07/15/2006   [name]  . OTHER SURGICAL HISTORY    . TONSILLECTOMY    . TUBAL LIGATION  1963    No Known Allergies  Outpatient Encounter Medications as of 01/19/2019  Medication Sig  . atenolol (TENORMIN) 25 MG tablet Take 1/4 tablet daily  . Cholecalciferol (VITAMIN D3) 1000 units CAPS Take 1,000 Units by mouth daily.   . celecoxib (CELEBREX) 100 MG capsule Take 1 capsule (100 mg total) by mouth daily. WITH FOOD   No facility-administered encounter medications on file as of 01/19/2019.    Review of Systems:  Review of Systems  Constitutional: Negative for chills, fever and malaise/fatigue.  HENT: Positive for hearing loss. Negative for congestion and sore throat.   Eyes: Negative for blurred vision.       Glasses  Respiratory: Negative for cough and shortness of breath.   Cardiovascular: Negative for chest pain, palpitations and leg swelling.  Gastrointestinal: Negative for abdominal pain, blood in stool, constipation, diarrhea and melena.  Genitourinary: Negative for dysuria.  Musculoskeletal: Positive for back pain. Negative for falls.  Neurological: Negative for dizziness and loss of consciousness.  Endo/Heme/Allergies: Does not bruise/bleed easily.  Psychiatric/Behavioral: Positive for memory loss. Negative for depression. The patient is not nervous/anxious and does not have insomnia.     Health Maintenance  Topic Date Due  . TETANUS/TDAP  05/16/1943  . INFLUENZA VACCINE  Completed  . DEXA SCAN  Completed  . PNA vac Low Risk Adult  Completed    Physical Exam: Vitals:   01/19/19 0831  BP: 118/72  Pulse: 70  Temp: (!) 97.5 F (36.4 C)  SpO2: 97%  Weight: 111 lb 1.6 oz (50.4 kg)  Height: 5\' 1"  (1.549 m)   Body mass index is 20.99 kg/m. Physical Exam Vitals reviewed.  Constitutional:      General: She  is not in acute distress.    Appearance: Normal appearance. She is not toxic-appearing.  HENT:     Head: Normocephalic and atraumatic.  Cardiovascular:     Rate and Rhythm: Normal rate and regular rhythm.     Pulses: Normal pulses.     Heart sounds: Normal heart sounds.  Pulmonary:     Effort: Pulmonary effort is normal.     Breath sounds: Normal breath sounds.  Musculoskeletal:     Comments: Posture more stooped, loss of costoiliac space; tender over left sacroiliac region even when sitting; swinging left leg out as walking with 4 point cane  Skin:    General: Skin is warm and dry.  Neurological:     General: No focal deficit present.     Mental Status: She is alert and oriented to person, place, and time.     Comments: Short-term memory loss--did not realize she's currently getting PT with Thamas Jaegers  Psychiatric:        Mood and Affect: Mood normal.        Behavior: Behavior normal.     Labs reviewed: Basic Metabolic Panel: Recent Labs    05/25/18 0000 08/10/18 0000  NA 137 135*  K 4.6 4.3  BUN 19 22*  CREATININE 0.6 0.7   Liver Function Tests: No results for input(s): AST, ALT, ALKPHOS, BILITOT, PROT, ALBUMIN in the last 8760 hours. No results for input(s): LIPASE, AMYLASE in the last 8760 hours. No results for input(s): AMMONIA in the last 8760 hours. CBC: No results for input(s): WBC, NEUTROABS, HGB, HCT, MCV, PLT in the last 8760 hours. Lipid Panel: No results for input(s): CHOL, HDL, LDLCALC, TRIG, CHOLHDL, LDLDIRECT in the last 8760 hours. Lab Results  Component Value Date   HGBA1C 5.9 (H) 01/23/2016    Assessment/Plan 1. Pain of left sacroiliac joint - need copy of any imaging Dr. Gladstone Lighter did and his note as it's not scanned and I thought I'd seen it come through a month ago but now I don't see it in epic (just the indication that she was there) - start celecoxib (CELEBREX) 100 MG capsule; Take 1 capsule (100 mg total) by mouth daily. WITH FOOD  Dispense: 30  capsule; Refill: 1 -may take tylenol for breakthrough not to exceed 3000mg  -continue PT -pt given written out instructions to call me back if she's still having considerable pain with the celebrex just once daily (could be increased to bid but I want to know about it if she needs to do that)  2. Unsteady gait when walking -continue cane for support -continue PT exercises Thamas Jaegers gave her  3. Mild cognitive impairment -progressing likely not helped by covid-related isolation  -continue to monitor--would likely benefit from considering AL in near future   Labs/tests ordered:  No new added--has before her next visit  Next appt:  02/23/2019--keep as scheduled  Sahra Converse L. Rinoa Garramone, D.O. [facility] [address], [place] [address] Cell Phone (Mon-Fri 8am-5pm):  [phone] On Call:  [phone] & follow prompts after 5pm & weekends Office Phone:  [phone] Office Fax:  [phone]

## 2019-01-19 NOTE — Patient Instructions (Signed)
Your left sacroiliac area of your back is hurting you.   I will get Dr. Charlestine Night note from December (not yet scanned) to see what your back xrays looked like at his office. Let's start you on celebrex 100mg  daily WITH FOOD.   You may also continue tylenol for breakthrough pain.  Just be sure not to take more than 3000mg  of tylenol (3 grams) per day. I will ask PT to work with you again.  I'm glad you've still been keeping up your exercises Thamas Jaegers gave you before, also. Let me know if you are still having pain after one week of regularly taking the new pill (celebrex).

## 2019-01-26 DIAGNOSIS — R293 Abnormal posture: Secondary | ICD-10-CM | POA: Diagnosis not present

## 2019-01-26 DIAGNOSIS — R2689 Other abnormalities of gait and mobility: Secondary | ICD-10-CM | POA: Diagnosis not present

## 2019-01-26 DIAGNOSIS — M810 Age-related osteoporosis without current pathological fracture: Secondary | ICD-10-CM | POA: Diagnosis not present

## 2019-01-26 DIAGNOSIS — M545 Low back pain: Secondary | ICD-10-CM | POA: Diagnosis not present

## 2019-01-26 DIAGNOSIS — M25651 Stiffness of right hip, not elsewhere classified: Secondary | ICD-10-CM | POA: Diagnosis not present

## 2019-01-26 DIAGNOSIS — M25652 Stiffness of left hip, not elsewhere classified: Secondary | ICD-10-CM | POA: Diagnosis not present

## 2019-01-26 DIAGNOSIS — M25552 Pain in left hip: Secondary | ICD-10-CM | POA: Diagnosis not present

## 2019-01-31 ENCOUNTER — Encounter: Payer: Self-pay | Admitting: Internal Medicine

## 2019-02-08 DIAGNOSIS — M25651 Stiffness of right hip, not elsewhere classified: Secondary | ICD-10-CM | POA: Diagnosis not present

## 2019-02-08 DIAGNOSIS — R293 Abnormal posture: Secondary | ICD-10-CM | POA: Diagnosis not present

## 2019-02-08 DIAGNOSIS — M25652 Stiffness of left hip, not elsewhere classified: Secondary | ICD-10-CM | POA: Diagnosis not present

## 2019-02-08 DIAGNOSIS — R2689 Other abnormalities of gait and mobility: Secondary | ICD-10-CM | POA: Diagnosis not present

## 2019-02-08 DIAGNOSIS — M810 Age-related osteoporosis without current pathological fracture: Secondary | ICD-10-CM | POA: Diagnosis not present

## 2019-02-08 DIAGNOSIS — M25552 Pain in left hip: Secondary | ICD-10-CM | POA: Diagnosis not present

## 2019-02-08 DIAGNOSIS — M545 Low back pain: Secondary | ICD-10-CM | POA: Diagnosis not present

## 2019-02-10 ENCOUNTER — Other Ambulatory Visit: Payer: Self-pay

## 2019-02-10 DIAGNOSIS — R293 Abnormal posture: Secondary | ICD-10-CM | POA: Diagnosis not present

## 2019-02-10 DIAGNOSIS — M25552 Pain in left hip: Secondary | ICD-10-CM | POA: Diagnosis not present

## 2019-02-10 DIAGNOSIS — M810 Age-related osteoporosis without current pathological fracture: Secondary | ICD-10-CM | POA: Diagnosis not present

## 2019-02-10 DIAGNOSIS — R2689 Other abnormalities of gait and mobility: Secondary | ICD-10-CM | POA: Diagnosis not present

## 2019-02-10 DIAGNOSIS — M533 Sacrococcygeal disorders, not elsewhere classified: Secondary | ICD-10-CM

## 2019-02-10 DIAGNOSIS — M25651 Stiffness of right hip, not elsewhere classified: Secondary | ICD-10-CM | POA: Diagnosis not present

## 2019-02-10 DIAGNOSIS — M545 Low back pain: Secondary | ICD-10-CM | POA: Diagnosis not present

## 2019-02-10 DIAGNOSIS — M25652 Stiffness of left hip, not elsewhere classified: Secondary | ICD-10-CM | POA: Diagnosis not present

## 2019-02-10 MED ORDER — CELECOXIB 100 MG PO CAPS: 100.0000 mg | ORAL_CAPSULE | Freq: Every day | ORAL | 1 refills | Status: DC

## 2019-02-14 DIAGNOSIS — M25651 Stiffness of right hip, not elsewhere classified: Secondary | ICD-10-CM | POA: Diagnosis not present

## 2019-02-14 DIAGNOSIS — M545 Low back pain: Secondary | ICD-10-CM | POA: Diagnosis not present

## 2019-02-14 DIAGNOSIS — M25552 Pain in left hip: Secondary | ICD-10-CM | POA: Diagnosis not present

## 2019-02-14 DIAGNOSIS — R293 Abnormal posture: Secondary | ICD-10-CM | POA: Diagnosis not present

## 2019-02-14 DIAGNOSIS — M810 Age-related osteoporosis without current pathological fracture: Secondary | ICD-10-CM | POA: Diagnosis not present

## 2019-02-14 DIAGNOSIS — R2689 Other abnormalities of gait and mobility: Secondary | ICD-10-CM | POA: Diagnosis not present

## 2019-02-14 DIAGNOSIS — M25652 Stiffness of left hip, not elsewhere classified: Secondary | ICD-10-CM | POA: Diagnosis not present

## 2019-02-18 DIAGNOSIS — R2689 Other abnormalities of gait and mobility: Secondary | ICD-10-CM | POA: Diagnosis not present

## 2019-02-18 DIAGNOSIS — M25552 Pain in left hip: Secondary | ICD-10-CM | POA: Diagnosis not present

## 2019-02-18 DIAGNOSIS — M25652 Stiffness of left hip, not elsewhere classified: Secondary | ICD-10-CM | POA: Diagnosis not present

## 2019-02-18 DIAGNOSIS — M810 Age-related osteoporosis without current pathological fracture: Secondary | ICD-10-CM | POA: Diagnosis not present

## 2019-02-18 DIAGNOSIS — M545 Low back pain: Secondary | ICD-10-CM | POA: Diagnosis not present

## 2019-02-18 DIAGNOSIS — R293 Abnormal posture: Secondary | ICD-10-CM | POA: Diagnosis not present

## 2019-02-18 DIAGNOSIS — M25651 Stiffness of right hip, not elsewhere classified: Secondary | ICD-10-CM | POA: Diagnosis not present

## 2019-02-22 DIAGNOSIS — R293 Abnormal posture: Secondary | ICD-10-CM | POA: Diagnosis not present

## 2019-02-22 DIAGNOSIS — M545 Low back pain: Secondary | ICD-10-CM | POA: Diagnosis not present

## 2019-02-22 DIAGNOSIS — I10 Essential (primary) hypertension: Secondary | ICD-10-CM | POA: Diagnosis not present

## 2019-02-22 DIAGNOSIS — M25552 Pain in left hip: Secondary | ICD-10-CM | POA: Diagnosis not present

## 2019-02-22 DIAGNOSIS — M810 Age-related osteoporosis without current pathological fracture: Secondary | ICD-10-CM | POA: Diagnosis not present

## 2019-02-22 DIAGNOSIS — R2689 Other abnormalities of gait and mobility: Secondary | ICD-10-CM | POA: Diagnosis not present

## 2019-02-22 DIAGNOSIS — E871 Hypo-osmolality and hyponatremia: Secondary | ICD-10-CM | POA: Diagnosis not present

## 2019-02-22 DIAGNOSIS — E785 Hyperlipidemia, unspecified: Secondary | ICD-10-CM | POA: Diagnosis not present

## 2019-02-22 DIAGNOSIS — D649 Anemia, unspecified: Secondary | ICD-10-CM | POA: Diagnosis not present

## 2019-02-22 DIAGNOSIS — M25652 Stiffness of left hip, not elsewhere classified: Secondary | ICD-10-CM | POA: Diagnosis not present

## 2019-02-22 DIAGNOSIS — E7800 Pure hypercholesterolemia, unspecified: Secondary | ICD-10-CM | POA: Diagnosis not present

## 2019-02-22 DIAGNOSIS — M25651 Stiffness of right hip, not elsewhere classified: Secondary | ICD-10-CM | POA: Diagnosis not present

## 2019-02-22 LAB — BASIC METABOLIC PANEL
BUN: 18 (ref 4–21)
CO2: 23 — AB (ref 13–22)
Chloride: 100 (ref 99–108)
Creatinine: 0.7 (ref 0.5–1.1)
Glucose: 99
Potassium: 4.3 (ref 3.4–5.3)
Sodium: 138 (ref 137–147)

## 2019-02-22 LAB — CBC: RBC: 4.13 (ref 3.87–5.11)

## 2019-02-22 LAB — LIPID PANEL
Cholesterol: 295 — AB (ref 0–200)
HDL: 53 (ref 35–70)
LDL Cholesterol: 213
Triglycerides: 148 (ref 40–160)

## 2019-02-22 LAB — COMPREHENSIVE METABOLIC PANEL
Albumin: 4.1 (ref 3.5–5.0)
Calcium: 9 (ref 8.7–10.7)
Globulin: 2.3

## 2019-02-22 LAB — CBC AND DIFFERENTIAL
HCT: 40 (ref 36–46)
Hemoglobin: 13.7 (ref 12.0–16.0)
Platelets: 456 — AB (ref 150–399)
WBC: 6.3

## 2019-02-23 ENCOUNTER — Other Ambulatory Visit: Payer: Self-pay

## 2019-02-23 ENCOUNTER — Encounter: Payer: Self-pay | Admitting: Internal Medicine

## 2019-02-23 ENCOUNTER — Non-Acute Institutional Stay: Payer: Medicare Other | Admitting: Internal Medicine

## 2019-02-23 VITALS — BP 122/74 | HR 84 | Temp 97.5°F | Ht 61.0 in | Wt 112.0 lb

## 2019-02-23 DIAGNOSIS — M533 Sacrococcygeal disorders, not elsewhere classified: Secondary | ICD-10-CM | POA: Insufficient documentation

## 2019-02-23 DIAGNOSIS — R351 Nocturia: Secondary | ICD-10-CM | POA: Diagnosis not present

## 2019-02-23 DIAGNOSIS — H9193 Unspecified hearing loss, bilateral: Secondary | ICD-10-CM | POA: Insufficient documentation

## 2019-02-23 DIAGNOSIS — F0150 Vascular dementia without behavioral disturbance: Secondary | ICD-10-CM | POA: Diagnosis not present

## 2019-02-23 DIAGNOSIS — R2681 Unsteadiness on feet: Secondary | ICD-10-CM | POA: Insufficient documentation

## 2019-02-23 DIAGNOSIS — F0390 Unspecified dementia without behavioral disturbance: Secondary | ICD-10-CM | POA: Insufficient documentation

## 2019-02-23 NOTE — Progress Notes (Signed)
Location:  [facility] of Service:  Clinic (12)  Provider: Shaleah Nissley L. Mariea Clonts, D.O., C.M.D.  Code Status: DNR Goals of Care:  Advanced Directives 02/23/2019  Does Patient Have a Medical Advance Directive? Yes  Type of Advance Directive Out of facility DNR (pink MOST or yellow form)  Does patient want to make changes to medical advance directive? No - Patient declined  Copy of [place] in Chart? -  Would patient like information on creating a medical advance directive? -  Pre-existing out of facility DNR order (yellow form or pink MOST form) -     Chief Complaint  Patient presents with  . Medical Management of Chronic Issues     3 month Follow up     HPI: Patient is a 86 y.o. female seen today for medical management of chronic diseases.  I've been getting messages from [place] clinic nurse about her missing her lab appt, forgetting her appt with me.  I also got a note from her son about how she'd lost the celebrex tablets that had been delivered to her for her back pain.  She's had more difficulty with her short-term memory.  [place] clinic nurse was suggesting AL.    She feels like her left hip is better.  She did various exercises with him.  She reports continuing to do the exercises from the sheet [place] provided.  She's using her rollator.  She also had a course of prednisone.  She has not fallen.  xrays of left hip/pelvis had shown mild to moderate OA.    She knows her memory has gone down.  She keeps a sheet where she checks off her medications when she takes them.  She has gotten the celebrex yesterday by her report.  She is very content where she is and does not want to move to AL.      Weight is stable.  She continues her same routine for her meals.    At first she said she thought she'd had her vaccine in her right forearm (bandaid there) rather than her upper arm, but I asked could that be where he blood work was done and she said, oh yes,  someone different did it than usual and I thought it was an unusual place.  She has had her covid vaccines.    She is now getting up every 2 hours to urinate and struggling to go back to sleep.  Not as bothersome in the daytime.  Labs were reviewed:  Sodium normal.  Kidneys normal.  Platelets mildly elevated.  Enlarged RBCs on cbc also.  Slight elevation alk phos (?due to her arthritis)  MMSE today down to 28/30 and failed clock drawing now.    Past Medical History:  Diagnosis Date  . Acute upper respiratory infections of unspecified site 06/09/2011  . Breast cancer ([place]) 12/11/2008  . Fatigue 01/2012  . Hypercholesterolemia    on lipitor  . Hypertension   . Hyponatremia 01/2016  . Memory loss   . Mitral valve prolapse   . Other abnormal blood chemistry 01/21/2010  . Palpitations 08/05/2000  . Right bundle branch block   . Scoliosis   . Senile osteoporosis 09/28/2001    Past Surgical History:  Procedure Laterality Date  . BREAST LUMPECTOMY Right 12/27/2007  . LAPAROSCOPIC LYSIS OF ADHESIONS  07/15/2006   [name]  . OTHER SURGICAL HISTORY    . TONSILLECTOMY    . [place]  No Known Allergies  Outpatient Encounter Medications as of 02/23/2019  Medication Sig  . atenolol (TENORMIN) 25 MG tablet Take 1/4 tablet daily  . celecoxib (CELEBREX) 100 MG capsule Take 1 capsule (100 mg total) by mouth daily. WITH FOOD  . Cholecalciferol (VITAMIN D3) 1000 units CAPS Take 1,000 Units by mouth daily.   . [DISCONTINUED] predniSONE (STERAPRED UNI-PAK 21 TAB) 10 MG (21) TBPK tablet Take 10 mg by mouth. Take 1 tab three times a day for 2 days, 1 tab twice a day for 5 days, 1 tab daily till finished   No facility-administered encounter medications on file as of 02/23/2019.    Review of Systems:  Review of Systems  Constitutional: Positive for malaise/fatigue. Negative for chills, fever and weight loss.  HENT: Positive for hearing loss. Negative for congestion.   Eyes: Negative for  blurred vision.       Glasses  Respiratory: Negative for cough and shortness of breath.   Cardiovascular: Negative for chest pain, palpitations and leg swelling.  Gastrointestinal: Negative for abdominal pain, blood in stool, constipation, diarrhea and melena.  Genitourinary: Positive for frequency. Negative for dysuria, hematuria and urgency.       Reports it's been this way for a while (I know she had some getting up, but more like 2x per night when she's told me and was going back to sleep afterward)  Musculoskeletal: Positive for back pain and joint pain. Negative for falls.  Neurological: Negative for dizziness, sensory change and loss of consciousness.  Endo/Heme/Allergies: Bruises/bleeds easily.  Psychiatric/Behavioral: Positive for memory loss. Negative for depression. The patient has insomnia. The patient is not nervous/anxious.        Difficulty returning to sleep after getting up to urinate    Health Maintenance  Topic Date Due  . TETANUS/TDAP  05/16/1943  . INFLUENZA VACCINE  Completed  . DEXA SCAN  Completed  . PNA vac Low Risk Adult  Completed    Physical Exam: Vitals:   02/23/19 0921  BP: 122/74  Pulse: 84  Temp: (!) 97.5 F (36.4 C)  SpO2: 99%  Weight: 112 lb (50.8 kg)  Height: '5\' 1"'  (1.549 m)   Body mass index is 21.16 kg/m. Physical Exam Vitals and nursing note reviewed.  Constitutional:      General: She is not in acute distress.    Appearance: Normal appearance. She is not ill-appearing or toxic-appearing.  HENT:     Head: Normocephalic and atraumatic.     Ears:     Comments: HOH Cardiovascular:     Rate and Rhythm: Normal rate and regular rhythm.     Pulses: Normal pulses.     Heart sounds: Normal heart sounds.  Pulmonary:     Effort: Pulmonary effort is normal.     Breath sounds: Normal breath sounds. No rales.  Abdominal:     General: Bowel sounds are normal.  Musculoskeletal:        General: Normal range of motion.     Right lower leg: No  edema.     Left lower leg: No edema.     Comments: Scoliosis noted with right hip higher than left while walking with rollator  Skin:    General: Skin is warm and dry.  Neurological:     General: No focal deficit present.     Mental Status: She is alert.     Cranial Nerves: No cranial nerve deficit.     Sensory: No sensory deficit.     Motor: No  weakness.     Coordination: Coordination normal.     Gait: Gait abnormal.     Comments: Gait is much better with use of rollator  Psychiatric:        Mood and Affect: Mood normal.     Comments: Quiet, does not volunteer information anymore--requires me to ask her    MMSE - Mini Mental State Exam 02/23/2019 09/15/2017 08/06/2016  Orientation to time '4 4 5  ' Orientation to Place '4 5 5  ' Registration '3 3 3  ' Attention/ Calculation '5 5 5  ' Recall '3 3 3  ' Language- name 2 objects '2 2 2  ' Language- repeat '1 1 1  ' Language- follow 3 step command '3 3 3  ' Language- read & follow direction '1 1 1  ' Write a sentence '1 1 1  ' Copy design '1 1 1  ' Total score '28 29 30    ' Labs reviewed: Basic Metabolic Panel: Recent Labs    05/25/18 0000 08/10/18 0000  NA 137 135*  K 4.6 4.3  BUN 19 22*  CREATININE 0.6 0.7   Liver Function Tests: No results for input(s): AST, ALT, ALKPHOS, BILITOT, PROT, ALBUMIN in the last 8760 hours. No results for input(s): LIPASE, AMYLASE in the last 8760 hours. No results for input(s): AMMONIA in the last 8760 hours. CBC: No results for input(s): WBC, NEUTROABS, HGB, HCT, MCV, PLT in the last 8760 hours. Lipid Panel: No results for input(s): CHOL, HDL, LDLCALC, TRIG, CHOLHDL, LDLDIRECT in the last 8760 hours. Lab Results  Component Value Date   HGBA1C 5.9 (H) 01/23/2016   01/23/16 CTA:  1.  Normal noncontrast CT appearance of the brain.  ASPECTS 10. 2. Negative for emergent large vessel occlusion. CT Perfusion data analysis is negative for infarct core or convincing penumbra. 3. The above was discussed by telephone with Dr.  Roland Rack on 01/23/2016 at 0648 hours. 4. Tortuous cervical carotid and vertebral arteries with minimal atherosclerosis and no stenosis. 5. Tortuous ICA siphons with minimal atherosclerosis and no stenosis. Otherwise negative anterior circulation; small left MCA M1 infundibulum of the left lenticulostriate artery origin (normal variant). 6. Diminutive vertebrobasilar system on the basis of fetal type PCA origins. Mild atherosclerosis and stenosis of the left PCA (P2 and P3 segments). Otherwise negative posterior circulation.   Assessment/Plan 1. Nocturia more than twice per night -seems overactive bladder is progressing -hydrates well and stopping early so that should not be the cause -recommend she try myrbetriq 67m at hs if her son who is a urologist agrees  2. Pain of left sacroiliac joint -improved after PT, reports following through on continued exercises that are written down (I had spoken with RThamas Jaegersand he'd ended her therapy b/c she was not able to recall what he'd taught her and did not seem to be improving anymore) -appears to be walking quite well with rollator today so I don't feel like referring back to ortho at this time will benefit her  3. Unsteady gait when walking -much better now with rollator use and she's able to get up out of her chair again w/o use of her arms which she could not do last time when she was hurting -just started celebrex for pain yesterday to be taken WITH food  4. Major neurocognitive disorder ([place] -unfortunately, her cognition is declining since around her hospitalization for hyponatremia a couple of years ago, but more markedly amid covid isolation when unable to see family and has been unable to walk like she did due to her  hip pain -get hearing evaluated -would like to repeat CT brain (last was 2018) -also check B12 level and TSH  5.  HOH -this has been progressing, as well, and I recommended she have an audiology evaluation she  she agrees to though she's hesitant b/c friends of hers are not happy with their hearing aids  Labs/tests ordered:  Will plan b12 and tsh before I see her in 6 wks  Next appt:  6 wk f/u after hearing eval, imaging of brain  Ziggy Chanthavong L. Pressley Barsky, D.O. [facility] [address], [place] [address] Cell Phone (Mon-Fri 8am-5pm):  [phone] On Call:  [phone] & follow prompts after 5pm & weekends Office Phone:  [phone] Office Fax:  [phone]

## 2019-02-25 ENCOUNTER — Telehealth: Payer: Self-pay

## 2019-02-25 ENCOUNTER — Encounter: Payer: Self-pay | Admitting: Internal Medicine

## 2019-02-25 NOTE — Telephone Encounter (Signed)
Per Dr. Lum Keas Cholesterol is very high, slightly elevated platelets and Na hl AlkPhos  Elevated.  (wondering if its elevated due to hip pain). Called patient with lab results.

## 2019-03-16 DIAGNOSIS — H903 Sensorineural hearing loss, bilateral: Secondary | ICD-10-CM | POA: Diagnosis not present

## 2019-03-22 ENCOUNTER — Ambulatory Visit
Admission: RE | Admit: 2019-03-22 | Discharge: 2019-03-22 | Disposition: A | Discharge status: Home / Self Care | Payer: Medicare Other | Source: Ambulatory Visit | Attending: Internal Medicine | Admitting: Internal Medicine

## 2019-03-22 DIAGNOSIS — F0390 Unspecified dementia without behavioral disturbance: Secondary | ICD-10-CM

## 2019-03-22 DIAGNOSIS — F0150 Vascular dementia without behavioral disturbance: Secondary | ICD-10-CM

## 2019-03-22 DIAGNOSIS — E039 Hypothyroidism, unspecified: Secondary | ICD-10-CM | POA: Diagnosis not present

## 2019-03-22 DIAGNOSIS — D519 Vitamin B12 deficiency anemia, unspecified: Secondary | ICD-10-CM | POA: Diagnosis not present

## 2019-03-22 DIAGNOSIS — R413 Other amnesia: Secondary | ICD-10-CM | POA: Diagnosis not present

## 2019-03-22 DIAGNOSIS — D649 Anemia, unspecified: Secondary | ICD-10-CM | POA: Diagnosis not present

## 2019-03-22 DIAGNOSIS — E7800 Pure hypercholesterolemia, unspecified: Secondary | ICD-10-CM | POA: Diagnosis not present

## 2019-03-22 DIAGNOSIS — R739 Hyperglycemia, unspecified: Secondary | ICD-10-CM | POA: Diagnosis not present

## 2019-03-22 LAB — TSH
TSH: 2.18 (ref 0.41–5.90)
TSH: 2.18 (ref <=5.90)

## 2019-03-22 LAB — VITAMIN B12
Vitamin B-12: 505
Vitamin B-12: 505

## 2019-03-22 NOTE — Progress Notes (Signed)
CT was pretty unremarkable.  It shows some MILD areas of poor circulation which are likely the source of her memory loss.  She does not even have any brain shrinkage which is typically seen with Alzheimer's.

## 2019-03-30 ENCOUNTER — Encounter: Payer: Self-pay | Admitting: Internal Medicine

## 2019-04-06 ENCOUNTER — Other Ambulatory Visit: Payer: Self-pay

## 2019-04-06 ENCOUNTER — Non-Acute Institutional Stay: Payer: Medicare Other | Admitting: Internal Medicine

## 2019-04-06 ENCOUNTER — Encounter: Payer: Self-pay | Admitting: Internal Medicine

## 2019-04-06 VITALS — BP 122/76 | HR 67 | Temp 97.5°F | Ht 61.0 in | Wt 111.6 lb

## 2019-04-06 DIAGNOSIS — F0150 Vascular dementia without behavioral disturbance: Secondary | ICD-10-CM | POA: Diagnosis not present

## 2019-04-06 DIAGNOSIS — M533 Sacrococcygeal disorders, not elsewhere classified: Secondary | ICD-10-CM | POA: Diagnosis not present

## 2019-04-06 DIAGNOSIS — M1612 Unilateral primary osteoarthritis, left hip: Secondary | ICD-10-CM

## 2019-04-06 DIAGNOSIS — Z853 Personal history of malignant neoplasm of breast: Secondary | ICD-10-CM | POA: Diagnosis not present

## 2019-04-06 DIAGNOSIS — N3281 Overactive bladder: Secondary | ICD-10-CM

## 2019-04-06 DIAGNOSIS — F0390 Unspecified dementia without behavioral disturbance: Secondary | ICD-10-CM

## 2019-04-06 DIAGNOSIS — M810 Age-related osteoporosis without current pathological fracture: Secondary | ICD-10-CM | POA: Diagnosis not present

## 2019-04-06 MED ORDER — MIRABEGRON ER 25 MG PO TB24: 25.0000 mg | ORAL_TABLET | Freq: Every day | ORAL | 5 refills | Status: DC

## 2019-04-06 NOTE — Progress Notes (Signed)
Location:  Occupational psychologist of Service:  Clinic (12)  Provider: Laurie Lovejoy L. Mariea Clonts, D.O., C.M.D.  Code Status: DNR Goals of Care:  Advanced Directives 04/06/2019  Does Patient Have a Medical Advance Directive? Yes  Type of Advance Directive Out of facility DNR (pink MOST or yellow form)  Does patient want to make changes to medical advance directive? No - Patient declined  Copy of [place] in Chart? -  Would patient like information on creating a medical advance directive? -  Pre-existing out of facility DNR order (yellow form or pink MOST form) Pink MOST/Yellow Form most recent copy in chart - Physician notified to receive inpatient order     Chief Complaint  Patient presents with  . Medical Management of Chronic Issues    6 week follow up     HPI: Patient is a 86 y.o. female seen today for medical management of chronic diseases.    Pain in hip is doing better since taking celebrex.  She is back to more walking by her report.  Gait remains abnormal and she is not using an assistive device to come over here this morning to clinic.  Left hip xray sept last year had shown mild to moderate OA.    She has no complaints at all herself.    We reviewed that the b12 was at goal of over 500 without supplements and her TSH was in normal range.  Latest Na stable.  CT head showed mild small vessel disease 03/22/19.  Last time, she was c/o getting up twice at night to urinate which was affecting sleep some.  Discussed with her son and he got her some samples of myrbetriq 25mg  and she's been taking them.  She says she's not getting up as much to urinate.    She had prior breast cancer that is in remission.  Past Medical History:  Diagnosis Date  . Acute upper respiratory infections of unspecified site 06/09/2011  . Breast cancer ([place]) 12/11/2008  . Fatigue 01/2012  . Hypercholesterolemia    on lipitor  . Hypertension   . Hyponatremia 01/2016  .  Memory loss   . Mitral valve prolapse   . Other abnormal blood chemistry 01/21/2010  . Palpitations 08/05/2000  . Right bundle branch block   . Scoliosis   . Senile osteoporosis 09/28/2001    Past Surgical History:  Procedure Laterality Date  . BREAST LUMPECTOMY Right 12/27/2007  . LAPAROSCOPIC LYSIS OF ADHESIONS  07/15/2006   [name]  . OTHER SURGICAL HISTORY    . TONSILLECTOMY    . TUBAL LIGATION  1963    No Known Allergies  Outpatient Encounter Medications as of 04/06/2019  Medication Sig  . atenolol (TENORMIN) 25 MG tablet Take 1/4 tablet daily  . celecoxib (CELEBREX) 100 MG capsule Take 1 capsule (100 mg total) by mouth daily. WITH FOOD  . Cholecalciferol (VITAMIN D3) 1000 units CAPS Take 1,000 Units by mouth daily.    No facility-administered encounter medications on file as of 04/06/2019.    Review of Systems:  Review of Systems  Constitutional: Negative for chills, fever and malaise/fatigue.       Wt stable at 111-112 lbs  HENT: Positive for hearing loss. Negative for congestion and sore throat.   Eyes: Negative for blurred vision.       Glasses  Respiratory: Negative for cough and shortness of breath.   Cardiovascular: Negative for chest pain, palpitations and leg swelling.  Gastrointestinal:  Negative for abdominal pain, constipation and diarrhea.  Genitourinary: Negative for frequency and urgency.  Musculoskeletal: Negative for falls.       Now denies hip or back pain  Neurological: Negative for dizziness and loss of consciousness.  Endo/Heme/Allergies: Bruises/bleeds easily.  Psychiatric/Behavioral: Positive for memory loss. Negative for depression. The patient is not nervous/anxious and does not have insomnia.     Health Maintenance  Topic Date Due  . TETANUS/TDAP  Never done  . INFLUENZA VACCINE  Completed  . DEXA SCAN  Completed  . PNA vac Low Risk Adult  Completed    Physical Exam: Vitals:   04/06/19 0927  BP: 122/76  Pulse: 67  Temp: (!) 97.5 F  (36.4 C)  SpO2: 97%  Weight: 111 lb 9.6 oz (50.6 kg)  Height: 5\' 1"  (1.549 m)   Body mass index is 21.09 kg/m. Physical Exam Vitals reviewed.  Constitutional:      General: She is not in acute distress.    Appearance: Normal appearance. She is not toxic-appearing.  HENT:     Head: Normocephalic and atraumatic.  Cardiovascular:     Rate and Rhythm: Normal rate and regular rhythm.     Pulses: Normal pulses.     Heart sounds: Normal heart sounds.  Pulmonary:     Effort: Pulmonary effort is normal.     Breath sounds: Normal breath sounds. No wheezing, rhonchi or rales.  Abdominal:     General: Bowel sounds are normal.  Musculoskeletal:     Comments: No tenderness on exam of hips, SI joints or back  Skin:    General: Skin is warm and dry.     Coloration: Skin is pale.  Neurological:     General: No focal deficit present.     Mental Status: She is alert.     Cranial Nerves: No cranial nerve deficit.     Motor: No weakness.     Coordination: Coordination normal.     Gait: Gait abnormal.  Psychiatric:        Mood and Affect: Mood normal.     Labs reviewed: Basic Metabolic Panel: Recent Labs    05/25/18 0000 08/10/18 0000 02/22/19 0500 03/22/19 0000  NA 137 135* 138  --   K 4.6 4.3 4.3  --   CL  --   --  100  --   CO2  --   --  23*  --   BUN 19 22* 18  --   CREATININE 0.6 0.7 0.7  --   CALCIUM  --   --  9.0  --   TSH  --   --   --  2.18  2.18   Liver Function Tests: Recent Labs    02/22/19 0500  ALBUMIN 4.1   No results for input(s): LIPASE, AMYLASE in the last 8760 hours. No results for input(s): AMMONIA in the last 8760 hours. CBC: Recent Labs    02/22/19 0500  WBC 6.3  HGB 13.7  HCT 40  PLT 456*   Lipid Panel: Recent Labs    02/22/19 0500  CHOL 295*  HDL 53  LDLCALC 213  TRIG 148   Lab Results  Component Value Date   HGBA1C 5.9 (H) 01/23/2016    Procedures since last visit: CT Head Wo Contrast  Result Date: 03/22/2019 CLINICAL  DATA:  Memory loss EXAM: CT HEAD WITHOUT CONTRAST TECHNIQUE: Contiguous axial images were obtained from the base of the skull through the vertex without intravenous contrast. COMPARISON:  01/23/2016  FINDINGS: Brain: No evidence of acute infarction, hemorrhage, hydrocephalus, extra-axial collection or mass lesion/mass effect. Mild periventricular and deep white matter hypodensity. Vascular: No hyperdense vessel or unexpected calcification. Skull: Normal. Negative for fracture or focal lesion. Sinuses/Orbits: No acute finding. Other: None. IMPRESSION: No acute intracranial pathology. Mild small-vessel white matter disease. Electronically Signed   By: Eddie Candle M.D.   On: 03/22/2019 15:32    Assessment/Plan 1. Major neurocognitive disorder [facility]) -appears she has progressed to early dementia at this point, is still managing independently with some help from her son with med mgt and appt tracking -John had said he will monitor her as he's able to visit more and let us know at [facility] if she's having increased needs in [place]  2. Pain of left sacroiliac joint -had therapy for "low back pain" end of last year with some improvement -suspect though that source is left hip  3. History of breast cancer in female -notable, xrays of hip did not indicate any evidence of metastatic disease in 2019  4. Senile osteoporosis -cont vitamin D3 and weightbearing exercise  5. Overactive bladder - improved with myrbetriq--if samples are not consistently available, I can send Rx to her pharmacy but I have not at this point - mirabegron ER (MYRBETRIQ) 25 MG TB24 tablet; Take 1 tablet (25 mg total) by mouth daily.  Dispense: 30 tablet; Refill: 5  6. Primary osteoarthritis of left hip -seems to be cause of her gait dysfunction and pain -cont celebrex with food and walking to maintain function and lubrication -if continues to progress, may need hip injection and potentially replacement  Labs/tests ordered:  Bmp before Next  appt:  07/20/2019 f/u at [place]. Kinaya Hilliker, D.O. [facility] [address], [place] [address] Cell Phone (Mon-Fri 8am-5pm):  [phone] On Call:  [phone] & follow prompts after 5pm & weekends Office Phone:  [phone] Office Fax:  [phone]

## 2019-04-12 DIAGNOSIS — H905 Unspecified sensorineural hearing loss: Secondary | ICD-10-CM | POA: Diagnosis not present

## 2019-07-12 DIAGNOSIS — D649 Anemia, unspecified: Secondary | ICD-10-CM | POA: Diagnosis not present

## 2019-07-12 DIAGNOSIS — E781 Pure hyperglyceridemia: Secondary | ICD-10-CM | POA: Diagnosis not present

## 2019-07-12 LAB — BASIC METABOLIC PANEL
BUN: 21 (ref 4–21)
CO2: 34 — AB (ref 13–22)
Chloride: 98 — AB (ref 99–108)
Creatinine: 0.7 (ref 0.5–1.1)
Glucose: 91
Potassium: 4.3 (ref 3.4–5.3)
Sodium: 135 — AB (ref 137–147)

## 2019-07-12 LAB — COMPREHENSIVE METABOLIC PANEL: Calcium: 9.4 (ref 8.7–10.7)

## 2019-07-14 ENCOUNTER — Telehealth: Payer: Self-pay

## 2019-07-14 NOTE — Telephone Encounter (Signed)
Discussed results with patient, patient verbalized understanding of results  Per Dr.Reed (hand written notes)  1.) Sodium is down to 135 2.) Last value was 138 (ok) on 02/22/2019 3.) Patient has appointment on 07/20/2019 @ 3:30 pm- we'll address then  Copy of labs mailed to patient, abstracted, and sent to scanning

## 2019-07-20 ENCOUNTER — Encounter: Payer: Self-pay | Admitting: Internal Medicine

## 2019-07-20 ENCOUNTER — Non-Acute Institutional Stay: Payer: Medicare Other | Admitting: Internal Medicine

## 2019-07-20 ENCOUNTER — Other Ambulatory Visit: Payer: Self-pay

## 2019-07-20 VITALS — BP 118/76 | HR 62 | Temp 98.1°F | Ht 61.0 in | Wt 121.0 lb

## 2019-07-20 DIAGNOSIS — D2239 Melanocytic nevi of other parts of face: Secondary | ICD-10-CM | POA: Diagnosis not present

## 2019-07-20 DIAGNOSIS — H9193 Unspecified hearing loss, bilateral: Secondary | ICD-10-CM

## 2019-07-20 DIAGNOSIS — R351 Nocturia: Secondary | ICD-10-CM

## 2019-07-20 DIAGNOSIS — M1612 Unilateral primary osteoarthritis, left hip: Secondary | ICD-10-CM

## 2019-07-20 DIAGNOSIS — F0150 Vascular dementia without behavioral disturbance: Secondary | ICD-10-CM

## 2019-07-20 DIAGNOSIS — E871 Hypo-osmolality and hyponatremia: Secondary | ICD-10-CM

## 2019-07-20 DIAGNOSIS — F0390 Unspecified dementia without behavioral disturbance: Secondary | ICD-10-CM

## 2019-07-20 NOTE — Progress Notes (Signed)
Location:  Occupational psychologist of Service:  Clinic (12)  Provider: Zay Yeargan L. Mariea Clonts, D.O., C.M.D.  Code Status: DNR Goals of Care:  Advanced Directives 07/20/2019  Does Patient Have a Medical Advance Directive? Yes  Type of Advance Directive Out of facility DNR (pink MOST or yellow form);Healthcare Power of Attorney  Does patient want to make changes to medical advance directive? No - Patient declined  Copy of [place] in Chart? Yes - validated most recent copy scanned in chart (See row information)  Would patient like information on creating a medical advance directive? -  Pre-existing out of facility DNR order (yellow form or pink MOST form) Pink MOST/Yellow Form most recent copy in chart - Physician notified to receive inpatient order     Chief Complaint  Patient presents with  . Medical Management of Chronic Issues     3 month follow up    HPI: Patient is a 86 y.o. female seen today for medical management of chronic diseases.    Hip pain is better.  Not taking celebrex now.  She has a new place on the right side of her nose that is painful if messed with.  Has been growing.  Her son was also concerned.  Her chart indicates she saw Dr. Allyn Kenner in the past for her skin.  She does not recall and is not sure if she's ever been seen by on site derm here at [facility].  She did not see a change in her bladder trips at night when on myrbetriq so she quit taking it.    She seems to be hearing me better with hearing aids.    She admits her memory is worse, but says not too bad.    Past Medical History:  Diagnosis Date  . Acute upper respiratory infections of unspecified site 06/09/2011  . Breast cancer ([place]) 12/11/2008  . Fatigue 01/2012  . Hypercholesterolemia    on lipitor  . Hypertension   . Hyponatremia 01/2016  . Memory loss   . Mitral valve prolapse   . Other abnormal blood chemistry 01/21/2010  . Palpitations 08/05/2000  .  Right bundle branch block   . Scoliosis   . Senile osteoporosis 09/28/2001    Past Surgical History:  Procedure Laterality Date  . BREAST LUMPECTOMY Right 12/27/2007  . LAPAROSCOPIC LYSIS OF ADHESIONS  07/15/2006   [name]  . OTHER SURGICAL HISTORY    . TONSILLECTOMY    . TUBAL LIGATION  1963    No Known Allergies  Outpatient Encounter Medications as of 07/20/2019  Medication Sig  . atenolol (TENORMIN) 25 MG tablet Take 1/4 tablet daily  . Cholecalciferol (VITAMIN D3) 1000 units CAPS Take 1,000 Units by mouth daily.   . [DISCONTINUED] celecoxib (CELEBREX) 100 MG capsule Take 1 capsule (100 mg total) by mouth daily. WITH FOOD  . [DISCONTINUED] mirabegron ER (MYRBETRIQ) 25 MG TB24 tablet Take 1 tablet (25 mg total) by mouth daily.   No facility-administered encounter medications on file as of 07/20/2019.    Review of Systems:  Review of Systems  Constitutional: Negative for chills, fever and malaise/fatigue.       Has gained her weight back eating ice cream  HENT: Positive for hearing loss.        Got hearing aids and doing much better  Eyes: Negative for blurred vision.  Respiratory: Negative for cough and shortness of breath.   Cardiovascular: Negative for chest pain, palpitations and leg  swelling.  Gastrointestinal: Negative for abdominal pain, constipation and diarrhea.  Genitourinary: Positive for frequency. Negative for dysuria.       At night--stopped myrbetriq due to lack of benefit  Musculoskeletal: Negative for back pain, falls and joint pain.  Neurological: Negative for dizziness and loss of consciousness.  Psychiatric/Behavioral: Positive for memory loss. Negative for depression. The patient is not nervous/anxious and does not have insomnia.     Health Maintenance  Topic Date Due  . TETANUS/TDAP  07/19/2020 (Originally 05/16/1943)  . INFLUENZA VACCINE  08/07/2019  . DEXA SCAN  Completed  . COVID-19 Vaccine  Completed  . PNA vac Low Risk Adult  Completed     Physical Exam: Vitals:   07/20/19 1538  BP: 118/76  Pulse: 62  Temp: 98.1 F (36.7 C)  TempSrc: Temporal  SpO2: 96%  Weight: 121 lb (54.9 kg)  Height: 5\' 1"  (1.549 m)   Body mass index is 22.86 kg/m. Physical Exam Vitals reviewed.  Constitutional:      General: She is not in acute distress.    Appearance: She is normal weight. She is not toxic-appearing.  HENT:     Head: Normocephalic and atraumatic.  Cardiovascular:     Rate and Rhythm: Normal rate and regular rhythm.     Pulses: Normal pulses.     Heart sounds: Normal heart sounds.  Pulmonary:     Effort: Pulmonary effort is normal.     Breath sounds: Normal breath sounds.  Abdominal:     General: Bowel sounds are normal.     Palpations: Abdomen is soft.     Tenderness: There is no abdominal tenderness. There is no guarding or rebound.  Musculoskeletal:        General: Normal range of motion.     Right lower leg: No edema.     Left lower leg: No edema.  Skin:    General: Skin is warm and dry.     Comments: Right side of nose with pencil eraser sized pearly scaly papule on it  Neurological:     General: No focal deficit present.     Mental Status: She is alert and oriented to person, place, and time.     Comments: But has some short-term memory loss  Psychiatric:        Mood and Affect: Mood normal.        Behavior: Behavior normal.        Thought Content: Thought content normal.        Judgment: Judgment normal.     Labs reviewed: Basic Metabolic Panel: Recent Labs    08/10/18 0000 02/22/19 0500 03/22/19 0000 07/12/19 0806  NA 135* 138  --  135*  K 4.3 4.3  --  4.3  CL  --  100  --  98*  CO2  --  23*  --  34*  BUN 22* 18  --  21  CREATININE 0.7 0.7  --  0.7  CALCIUM  --  9.0  --  9.4  TSH  --   --  2.18  2.18  --    Liver Function Tests: Recent Labs    02/22/19 0500  ALBUMIN 4.1   No results for input(s): LIPASE, AMYLASE in the last 8760 hours. No results for input(s): AMMONIA in the  last 8760 hours. CBC: Recent Labs    02/22/19 0500  WBC 6.3  HGB 13.7  HCT 40  PLT 456*   Lipid Panel: Recent Labs    02/22/19  0500  CHOL 295*  HDL 53  LDLCALC 213  TRIG 148   Lab Results  Component Value Date   HGBA1C 5.9 (H) 01/23/2016    Assessment/Plan 1. Hyponatremia -Na 135 down from 138 -she will go back again to being more liberal with her sodium intake -denies change in hydration status  2. Fibrous papule of nose -suspect basal cell -will refer to either on site of Dr Allyn Kenner at derm (send message to Dr. Jeffie Pollock to see if he has a preference)  3. Major neurocognitive disorder ([place]) -memory has declined, remains functional in [place], monitor carefully  4. Primary osteoarthritis of left hip -improved, walking better   5. Nocturia more than twice per night -persists and she reports that myrbetriq did not slow it down  6. Bilateral hearing loss, unspecified hearing loss type -now has hearing aids and hearing much better.    Labs/tests ordered: bmp before Next appt: 10/26/2019   Avayah Raffety L. Love Milbourne, D.O. [facility] [address], [place] [address] Cell Phone (Mon-Fri 8am-5pm):  [phone] On Call:  [phone] & follow prompts after 5pm & weekends Office Phone:  [phone] Office Fax:  [phone]

## 2019-08-31 DIAGNOSIS — D1801 Hemangioma of skin and subcutaneous tissue: Secondary | ICD-10-CM | POA: Diagnosis not present

## 2019-08-31 DIAGNOSIS — L820 Inflamed seborrheic keratosis: Secondary | ICD-10-CM | POA: Diagnosis not present

## 2019-08-31 DIAGNOSIS — L814 Other melanin hyperpigmentation: Secondary | ICD-10-CM | POA: Diagnosis not present

## 2019-08-31 DIAGNOSIS — L570 Actinic keratosis: Secondary | ICD-10-CM | POA: Diagnosis not present

## 2019-09-21 DIAGNOSIS — L814 Other melanin hyperpigmentation: Secondary | ICD-10-CM | POA: Diagnosis not present

## 2019-09-21 DIAGNOSIS — L570 Actinic keratosis: Secondary | ICD-10-CM | POA: Diagnosis not present

## 2019-09-21 DIAGNOSIS — L821 Other seborrheic keratosis: Secondary | ICD-10-CM | POA: Diagnosis not present

## 2019-09-21 DIAGNOSIS — D1801 Hemangioma of skin and subcutaneous tissue: Secondary | ICD-10-CM | POA: Diagnosis not present

## 2019-10-20 DIAGNOSIS — I10 Essential (primary) hypertension: Secondary | ICD-10-CM | POA: Diagnosis not present

## 2019-10-20 DIAGNOSIS — E871 Hypo-osmolality and hyponatremia: Secondary | ICD-10-CM | POA: Diagnosis not present

## 2019-10-20 DIAGNOSIS — R7989 Other specified abnormal findings of blood chemistry: Secondary | ICD-10-CM | POA: Diagnosis not present

## 2019-10-20 LAB — BASIC METABOLIC PANEL
BUN: 25 — AB (ref 4–21)
CO2: 27 — AB (ref 13–22)
Chloride: 102 (ref 99–108)
Creatinine: 0.7 (ref 0.5–1.1)
Glucose: 104
Potassium: 4.2 (ref 3.4–5.3)
Sodium: 138 (ref 137–147)

## 2019-10-20 LAB — COMPREHENSIVE METABOLIC PANEL: Calcium: 9.4 (ref 8.7–10.7)

## 2019-10-26 ENCOUNTER — Non-Acute Institutional Stay: Payer: Medicare Other | Admitting: Internal Medicine

## 2019-10-26 ENCOUNTER — Encounter: Payer: Self-pay | Admitting: Internal Medicine

## 2019-10-26 ENCOUNTER — Other Ambulatory Visit: Payer: Self-pay

## 2019-10-26 VITALS — BP 118/74 | HR 68 | Temp 97.7°F | Ht 61.0 in | Wt 121.0 lb

## 2019-10-26 DIAGNOSIS — F0390 Unspecified dementia without behavioral disturbance: Secondary | ICD-10-CM | POA: Diagnosis not present

## 2019-10-26 DIAGNOSIS — H9193 Unspecified hearing loss, bilateral: Secondary | ICD-10-CM

## 2019-10-26 DIAGNOSIS — M1612 Unilateral primary osteoarthritis, left hip: Secondary | ICD-10-CM | POA: Diagnosis not present

## 2019-10-26 DIAGNOSIS — R351 Nocturia: Secondary | ICD-10-CM

## 2019-10-26 DIAGNOSIS — E871 Hypo-osmolality and hyponatremia: Secondary | ICD-10-CM

## 2019-10-26 DIAGNOSIS — I10 Essential (primary) hypertension: Secondary | ICD-10-CM

## 2019-10-26 NOTE — Progress Notes (Signed)
Location:  Occupational psychologist of Service:  Clinic (12)  Provider: Astin Sayre L. Mariea Clonts, D.O., C.M.D.  Code Status: DNR Goals of Care:  Advanced Directives 10/26/2019  Does Patient Have a Medical Advance Directive? Yes  Type of Advance Directive Out of facility DNR (pink MOST or yellow form)  Does patient want to make changes to medical advance directive? No - Patient declined  Copy of [place] in Chart? -  Would patient like information on creating a medical advance directive? -  Pre-existing out of facility DNR order (yellow form or pink MOST form) -     Chief Complaint  Patient presents with  . Medical Management of Chronic Issues    3 month follow up/ with lab results   . Health Maintenance    influenza ([place])     HPI: Patient is a 86 y.o. female seen today for medical management of chronic diseases.    Will get flu shot with other residents here later this month.  She denies any recent pain or discomfort. Still getting her walks in.    Na was 138 last week.    Bladder control is "ok".    She's never slept well, but no different.  She's used to it.  BP is great on atenolol.  No dizziness or lightheadedness.  Memory not what it used to be, but ok.  She writes down everything.  She did remember to come today and for her labs this time.  She does not have any concerns for me and [name] has not sent me any messages either.    Past Medical History:  Diagnosis Date  . Acute upper respiratory infections of unspecified site 06/09/2011  . Breast cancer ([place]) 12/11/2008  . Fatigue 01/2012  . Hypercholesterolemia    on lipitor  . Hypertension   . Hyponatremia 01/2016  . Memory loss   . Mitral valve prolapse   . Other abnormal blood chemistry 01/21/2010  . Palpitations 08/05/2000  . Right bundle branch block   . Scoliosis   . Senile osteoporosis 09/28/2001    Past Surgical History:  Procedure Laterality Date  . BREAST LUMPECTOMY  Right 12/27/2007  . LAPAROSCOPIC LYSIS OF ADHESIONS  07/15/2006   [name]  . OTHER SURGICAL HISTORY    . TONSILLECTOMY    . TUBAL LIGATION  1963    No Known Allergies  Outpatient Encounter Medications as of 10/26/2019  Medication Sig  . atenolol (TENORMIN) 25 MG tablet Take 1/4 tablet daily  . Cholecalciferol (VITAMIN D3) 1000 units CAPS Take 1,000 Units by mouth daily.    No facility-administered encounter medications on file as of 10/26/2019.    Review of Systems:  Review of Systems  Constitutional: Negative for chills, fever and malaise/fatigue.  HENT: Positive for hearing loss. Negative for congestion and sore throat.   Eyes: Negative for blurred vision.       Glasses  Respiratory: Negative for cough and shortness of breath.   Cardiovascular: Negative for chest pain, palpitations and leg swelling.  Gastrointestinal: Negative for abdominal pain, blood in stool, constipation and melena.  Genitourinary: Negative for dysuria.  Musculoskeletal: Negative for back pain, falls and joint pain.  Skin: Negative for itching and rash.  Neurological: Negative for dizziness and loss of consciousness.  Endo/Heme/Allergies: Does not bruise/bleed easily.  Psychiatric/Behavioral: Positive for memory loss. Negative for depression. The patient has insomnia. The patient is not nervous/anxious.     Health Maintenance  Topic Date Due  .  INFLUENZA VACCINE  08/07/2019  . TETANUS/TDAP  07/19/2020 (Originally 05/16/1943)  . DEXA SCAN  Completed  . COVID-19 Vaccine  Completed  . PNA vac Low Risk Adult  Completed    Physical Exam: Vitals:   10/26/19 1139  BP: 118/74  Pulse: 68  Temp: 97.7 F (36.5 C)  SpO2: 98%  Weight: 121 lb (54.9 kg)  Height: 5\' 1"  (1.549 m)   Body mass index is 22.86 kg/m. Physical Exam Vitals reviewed.  Constitutional:      General: She is not in acute distress.    Appearance: Normal appearance. She is not toxic-appearing.  HENT:     Head: Normocephalic and  atraumatic.  Eyes:     Comments: glasses  Cardiovascular:     Rate and Rhythm: Normal rate and regular rhythm.     Pulses: Normal pulses.     Heart sounds: Normal heart sounds.  Pulmonary:     Effort: Pulmonary effort is normal.     Breath sounds: Normal breath sounds.  Abdominal:     General: Bowel sounds are normal.  Musculoskeletal:     Cervical back: Neck supple.     Right lower leg: No edema.     Left lower leg: No edema.     Comments: kyphosis  Neurological:     General: No focal deficit present.     Mental Status: She is alert and oriented to person, place, and time.     Cranial Nerves: No cranial nerve deficit.     Motor: No weakness.     Gait: Gait normal.  Psychiatric:        Mood and Affect: Mood normal.        Behavior: Behavior normal.     Labs reviewed: Basic Metabolic Panel: Recent Labs    02/22/19 0500 03/22/19 0000 07/12/19 0806 10/20/19 0000  NA 138  --  135* 138  K 4.3  --  4.3 4.2  CL 100  --  98* 102  CO2 23*  --  34* 27*  BUN 18  --  21 25*  CREATININE 0.7  --  0.7 0.7  CALCIUM 9.0  --  9.4 9.4  TSH  --  2.18  2.18  --   --    Liver Function Tests: Recent Labs    02/22/19 0500  ALBUMIN 4.1   No results for input(s): LIPASE, AMYLASE in the last 8760 hours. No results for input(s): AMMONIA in the last 8760 hours. CBC: Recent Labs    02/22/19 0500  WBC 6.3  HGB 13.7  HCT 40  PLT 456*   Lipid Panel: Recent Labs    02/22/19 0500  CHOL 295*  HDL 53  LDLCALC 213  TRIG 148   Lab Results  Component Value Date   HGBA1C 5.9 (H) 01/23/2016    Assessment/Plan 1. Hyponatremia -remains in normal range, f/u before next visit  2. Major neurocognitive disorder ([place]) -memory loss progressing, functioning ok in her apt with writing things down and getting reminders  3. Nocturia more than twice per night -reports no changes  4. Primary osteoarthritis of left hip -better lately, walking well at this time  5. Bilateral hearing  loss, unspecified hearing loss type -remains stable  6. Essential hypertension, benign -bp at goal w/o dizziness  Labs/tests ordered:  Bmp before next visit Next appt:  01/25/20  Tilton Marsalis L. Kamesha Herne, D.O. [facility] [address] [place] [address] Cell Phone (Mon-Fri 8am-5pm):  [phone] On Call:  [phone] & follow prompts after 5pm & weekends Office Phone:  [phone] Office Fax:  [phone]

## 2019-10-28 ENCOUNTER — Encounter: Payer: Self-pay | Admitting: Physician Assistant

## 2019-10-28 ENCOUNTER — Telehealth: Payer: Self-pay

## 2019-10-28 ENCOUNTER — Telehealth (INDEPENDENT_AMBULATORY_CARE_PROVIDER_SITE_OTHER): Payer: Medicare Other | Admitting: Physician Assistant

## 2019-10-28 VITALS — Ht 61.0 in

## 2019-10-28 DIAGNOSIS — R002 Palpitations: Secondary | ICD-10-CM

## 2019-10-28 DIAGNOSIS — I10 Essential (primary) hypertension: Secondary | ICD-10-CM

## 2019-10-28 NOTE — Telephone Encounter (Signed)
  Patient Consent for Virtual Visit         Cynthia Watkins has provided verbal consent on 10/28/2019 for a virtual visit (video or telephone).   CONSENT FOR VIRTUAL VISIT FOR:  Cynthia Watkins  By participating in this virtual visit I agree to the following:  I hereby voluntarily request, consent and authorize [facility] and its employed or contracted physicians, physician assistants, nurse practitioners or other licensed health care professionals (the Practitioner), to provide me with telemedicine health care services (the "Services") as deemed necessary by the treating Practitioner. I acknowledge and consent to receive the Services by the Practitioner via telemedicine. I understand that the telemedicine visit will involve communicating with the Practitioner through live audiovisual communication technology and the disclosure of certain medical information by electronic transmission. I acknowledge that I have been given the opportunity to request an in-person assessment or other available alternative prior to the telemedicine visit and am voluntarily participating in the telemedicine visit.  I understand that I have the right to withhold or withdraw my consent to the use of telemedicine in the course of my care at any time, without affecting my right to future care or treatment, and that the Practitioner or I may terminate the telemedicine visit at any time. I understand that I have the right to inspect all information obtained and/or recorded in the course of the telemedicine visit and may receive copies of available information for a reasonable fee.  I understand that some of the potential risks of receiving the Services via telemedicine include:  Marland Kitchen Delay or interruption in medical evaluation due to technological equipment failure or disruption; . Information transmitted may not be sufficient (e.g. poor resolution of images) to allow for appropriate medical decision making by the Practitioner;  and/or  . In rare instances, security protocols could fail, causing a breach of personal health information.  Furthermore, I acknowledge that it is my responsibility to provide information about my medical history, conditions and care that is complete and accurate to the best of my ability. I acknowledge that Practitioner's advice, recommendations, and/or decision may be based on factors not within their control, such as incomplete or inaccurate data provided by me or distortions of diagnostic images or specimens that may result from electronic transmissions. I understand that the practice of medicine is not an exact science and that Practitioner makes no warranties or guarantees regarding treatment outcomes. I acknowledge that a copy of this consent can be made available to me via my patient portal ([place]), or I can request a printed copy by calling the office of [facility].    I understand that my insurance will be billed for this visit.   I have read or had this consent read to me. . I understand the contents of this consent, which adequately explains the benefits and risks of the Services being provided via telemedicine.  . I have been provided ample opportunity to ask questions regarding this consent and the Services and have had my questions answered to my satisfaction. . I give my informed consent for the services to be provided through the use of telemedicine in my medical care

## 2019-10-28 NOTE — Telephone Encounter (Signed)
°  Patient Consent for Virtual Visit         Cynthia Watkins has provided verbal consent on 10/28/2019 for a virtual visit (video or telephone).   CONSENT FOR VIRTUAL VISIT FOR:  Cynthia Watkins  By participating in this virtual visit I agree to the following:  I hereby voluntarily request, consent and authorize [facility] and its employed or contracted physicians, physician assistants, nurse practitioners or other licensed health care professionals (the Practitioner), to provide me with telemedicine health care services (the Services") as deemed necessary by the treating Practitioner. I acknowledge and consent to receive the Services by the Practitioner via telemedicine. I understand that the telemedicine visit will involve communicating with the Practitioner through live audiovisual communication technology and the disclosure of certain medical information by electronic transmission. I acknowledge that I have been given the opportunity to request an in-person assessment or other available alternative prior to the telemedicine visit and am voluntarily participating in the telemedicine visit.  I understand that I have the right to withhold or withdraw my consent to the use of telemedicine in the course of my care at any time, without affecting my right to future care or treatment, and that the Practitioner or I may terminate the telemedicine visit at any time. I understand that I have the right to inspect all information obtained and/or recorded in the course of the telemedicine visit and may receive copies of available information for a reasonable fee.  I understand that some of the potential risks of receiving the Services via telemedicine include:   Delay or interruption in medical evaluation due to technological equipment failure or disruption;  Information transmitted may not be sufficient (e.g. poor resolution of images) to allow for appropriate medical decision making by the Practitioner;  and/or   In rare instances, security protocols could fail, causing a breach of personal health information.  Furthermore, I acknowledge that it is my responsibility to provide information about my medical history, conditions and care that is complete and accurate to the best of my ability. I acknowledge that Practitioner's advice, recommendations, and/or decision may be based on factors not within their control, such as incomplete or inaccurate data provided by me or distortions of diagnostic images or specimens that may result from electronic transmissions. I understand that the practice of medicine is not an exact science and that Practitioner makes no warranties or guarantees regarding treatment outcomes. I acknowledge that a copy of this consent can be made available to me via my patient portal ([place]), or I can request a printed copy by calling the office of [facility].    I understand that my insurance will be billed for this visit.   I have read or had this consent read to me.  I understand the contents of this consent, which adequately explains the benefits and risks of the Services being provided via telemedicine.   I have been provided ample opportunity to ask questions regarding this consent and the Services and have had my questions answered to my satisfaction.  I give my informed consent for the services to be provided through the use of telemedicine in my medical care

## 2019-10-28 NOTE — Progress Notes (Signed)
  Patient Consent for Virtual Visit         Cynthia Watkins has provided verbal consent on 10/28/2019 for a virtual visit (video or telephone).   CONSENT FOR VIRTUAL VISIT FOR:  Cynthia Watkins  By participating in this virtual visit I agree to the following:  I hereby voluntarily request, consent and authorize [facility] and its employed or contracted physicians, physician assistants, nurse practitioners or other licensed health care professionals (the Practitioner), to provide me with telemedicine health care services (the "Services") as deemed necessary by the treating Practitioner. I acknowledge and consent to receive the Services by the Practitioner via telemedicine. I understand that the telemedicine visit will involve communicating with the Practitioner through live audiovisual communication technology and the disclosure of certain medical information by electronic transmission. I acknowledge that I have been given the opportunity to request an in-person assessment or other available alternative prior to the telemedicine visit and am voluntarily participating in the telemedicine visit.  I understand that I have the right to withhold or withdraw my consent to the use of telemedicine in the course of my care at any time, without affecting my right to future care or treatment, and that the Practitioner or I may terminate the telemedicine visit at any time. I understand that I have the right to inspect all information obtained and/or recorded in the course of the telemedicine visit and may receive copies of available information for a reasonable fee.  I understand that some of the potential risks of receiving the Services via telemedicine include:  Marland Kitchen Delay or interruption in medical evaluation due to technological equipment failure or disruption; . Information transmitted may not be sufficient (e.g. poor resolution of images) to allow for appropriate medical decision making by the Practitioner;  and/or  . In rare instances, security protocols could fail, causing a breach of personal health information.  Furthermore, I acknowledge that it is my responsibility to provide information about my medical history, conditions and care that is complete and accurate to the best of my ability. I acknowledge that Practitioner's advice, recommendations, and/or decision may be based on factors not within their control, such as incomplete or inaccurate data provided by me or distortions of diagnostic images or specimens that may result from electronic transmissions. I understand that the practice of medicine is not an exact science and that Practitioner makes no warranties or guarantees regarding treatment outcomes. I acknowledge that a copy of this consent can be made available to me via my patient portal ([place] [facility]), or I can request a printed copy by calling the office of [facility].    I understand that my insurance will be billed for this visit.   I have read or had this consent read to me. . I understand the contents of this consent, which adequately explains the benefits and risks of the Services being provided via telemedicine.  . I have been provided ample opportunity to ask questions regarding this consent and the Services and have had my questions answered to my satisfaction. . I give my informed consent for the services to be provided through the use of telemedicine in my medical care

## 2019-10-28 NOTE — Progress Notes (Signed)
Virtual Visit via Telephone Note   This visit type was conducted due to national recommendations for restrictions regarding the COVID-19 Pandemic (e.g. social distancing) in an effort to limit this patient's exposure and mitigate transmission in our community.  Due to her co-morbid illnesses, this patient is at least at moderate risk for complications without adequate follow up.  This format is felt to be most appropriate for this patient at this time.  The patient did not have access to video technology/had technical difficulties with video requiring transitioning to audio format only (telephone).  All issues noted in this document were discussed and addressed.  No physical exam could be performed with this format.  Please refer to the patient's chart for her  consent to telehealth for [facility].    Date:  10/28/2019   ID:  Cynthia Watkins, DOB 1924-07-27, MRN [id number] The patient was identified using 2 identifiers.  Patient Location: Home Provider Location: Office/Clinic  PCP:  Gayland Curry, DO  Cardiologist:  [name], MD  Electrophysiologist:  None   Evaluation Performed:  Follow-Up Visit  Chief Complaint:  Follow up  History of Present Illness:    Cynthia Watkins is a 86 y.o. female with past medical history of hypertension, hyperlipidemia, RBBB and history of palpitation.  Patient was previously seen for dizziness and palpitations in 2016.  Event monitor showed no significant arrhythmia.  Echocardiogram obtained on 07/13/2014 showed EF 55 to 60%, no regional wall motion abnormality, mild AI, mild to moderate MR, mild LAE.  All of her antihypertensive medication were discontinued.  She was seen again in 2017 for increased palpitation, repeat event monitor showed no significant arrhythmia other than PACs and one brief run of NSVT.  In January 2018, patient was admitted to the hospital with GI illness and acute encephalopathy with severe hyponatremia.  She was treated with  hypertonic saline and the Lasix.  She was eventually discharged on atenolol and Lasix.  She returned to the ED in late February 2018 with shortness of breath and lightheadedness.  She was orthostatic at the time, Lasix was discontinued.  Repeat echocardiogram obtained in February 2018 showed normal EF, grade 2 DD, mild to moderate TR, PA peak pressure 36 mmHg.  Patient was last seen by Dr. Martinique in October 2020 at which time she was doing well and currently resides at [facility].  She presents today for virtual visit.  She denies any recent chest pain or significant shortness of breath.  There was a single episode of tachypalpitations for which she took extra dose of atenolol and has not had any further symptoms.  She continued to do well at [facility] and stay active and walk for about an hour a day.  Instead of on the 25 mg atenolol tablet and cut in quarters, she is actually on 6.25 of atenolol capsule.  I am only able to find this medication you are formulary.  She seems to be doing quite well on the current dose of atenolol.  Therefore I did not make any medication adjustment.  She had a head CT obtained in March 2021 due to memory issues, this did not show any acute intracranial pathology, however does reveal mild small vessel white matter disease.  The patient does not have symptoms concerning for COVID-19 infection (fever, chills, cough, or new shortness of breath).    Past Medical History:  Diagnosis Date  . Acute upper respiratory infections of unspecified site 06/09/2011  . Breast cancer ([place]) 12/11/2008  .  Fatigue 01/2012  . Hypercholesterolemia    on lipitor  . Hypertension   . Hyponatremia 01/2016  . Memory loss   . Mitral valve prolapse   . Other abnormal blood chemistry 01/21/2010  . Palpitations 08/05/2000  . Right bundle branch block   . Scoliosis   . Senile osteoporosis 09/28/2001   Past Surgical History:  Procedure Laterality Date  . BREAST LUMPECTOMY Right 12/27/2007  .  LAPAROSCOPIC LYSIS OF ADHESIONS  07/15/2006   [name]  . OTHER SURGICAL HISTORY    . TONSILLECTOMY    . TUBAL LIGATION  1963     No outpatient medications have been marked as taking for the 10/28/19 encounter (Appointment) with Almyra Deforest, [place].     Allergies:   Patient has no known allergies.   Social History   Tobacco Use  . Smoking status: Former Smoker    Packs/day: 0.25    Years: 10.00    Pack years: 2.50    Quit date: 08/06/1954    Years since quitting: 65.2  . Smokeless tobacco: Never Used  Vaping Use  . Vaping Use: Never used  Substance Use Topics  . Alcohol use: Yes    Comment: Occasionally   . Drug use: No     Family Hx: The patient's family history includes Alzheimer's disease in her sister; Cancer in her father; Stroke in her mother.  ROS:   Please see the history of present illness.     All other systems reviewed and are negative.   Prior CV studies:   The following studies were reviewed today:  Echo 02/21/2016 - Left ventricle: The cavity size was normal. Wall thickness was  normal. Features are consistent with a pseudonormal left  ventricular filling pattern, with concomitant abnormal relaxation  and increased filling pressure (grade 2 diastolic dysfunction).  - Aortic valve: Mildly to moderately calcified annulus. There was  mild regurgitation. Valve area (VTI): 1.19 cm^2. Valve area  (Vmax): 1.19 cm^2. Valve area (Vmean): 1.29 cm^2.  - Tricuspid valve: There was mild-moderate regurgitation.  - Pulmonary arteries: Systolic pressure was mildly increased. PA  peak pressure: 36 mm Hg (S).    Labs/Other Tests and Data Reviewed:    EKG:  An ECG dated 10/20/2018 was personally reviewed today and demonstrated:  Normal sinus rhythm, right bundle branch block.  Recent Labs: 02/22/2019: Hemoglobin 13.7; Platelets 456 03/22/2019: TSH 2.18; TSH 2.18 10/20/2019: BUN 25; Creatinine 0.7; Potassium 4.2; Sodium 138   Recent Lipid Panel Lab Results    Component Value Date/Time   CHOL 295 (A) 02/22/2019 05:00 AM   TRIG 148 02/22/2019 05:00 AM   HDL 53 02/22/2019 05:00 AM   CHOLHDL 3.5 01/27/2016 02:41 AM   LDLCALC 213 02/22/2019 05:00 AM    Wt Readings from Last 3 Encounters:  10/26/19 121 lb (54.9 kg)  07/20/19 121 lb (54.9 kg)  04/06/19 111 lb 9.6 oz (50.6 kg)     Risk Assessment/Calculations:      Objective:    Vital Signs:  There were no vitals taken for this visit.   VITAL SIGNS:  reviewed  ASSESSMENT & PLAN:    1. Palpitation: Well-controlled on the current atenolol.  Instead of taking 1/4 tablet of 25 mg atenolol, she was able to obtain 6.25 mg capsules.   2. Hypertension: Continue on current therapy  COVID-19 Education: The signs and symptoms of COVID-19 were discussed with the patient and how to seek care for testing (follow up with PCP or arrange E-visit).  The  importance of social distancing was discussed today.  Time:   Today, I have spent 5 minutes with the patient with telehealth technology discussing the above problems.     Medication Adjustments/Labs and Tests Ordered: Current medicines are reviewed at length with the patient today.  Concerns regarding medicines are outlined above.   Tests Ordered: No orders of the defined types were placed in this encounter.   Medication Changes: No orders of the defined types were placed in this encounter.   Follow Up:  In Person in 1 year(s)  Signed, Almyra Deforest, [place]  10/28/2019 9:31 AM    [place]

## 2019-10-28 NOTE — Patient Instructions (Signed)
Medication Instructions:  No Medication changes *If you need a refill on your cardiac medications before your next appointment, please call your pharmacy*   Lab Work: No Labs If you have labs (blood work) drawn today and your tests are completely normal, you will receive your results only by: Marland Kitchen MyChart Message (if you have MyChart) OR . A paper copy in the mail If you have any lab test that is abnormal or we need to change your treatment, we will call you to review the results.   Testing/Procedures: No Testing   Follow-Up: At [facility], you and your health needs are our priority.  As part of our continuing mission to provide you with exceptional heart care, we have created designated Provider Care Teams.  These Care Teams include your primary Cardiologist (physician) and Advanced Practice Providers (APPs -  Physician Assistants and Nurse Practitioners) who all work together to provide you with the care you need, when you need it.  Your next appointment:   1 year(s)  The format for your next appointment:   In Person  Provider:   [name], MD

## 2019-11-15 ENCOUNTER — Encounter: Payer: Self-pay | Admitting: Nurse Practitioner

## 2019-11-15 ENCOUNTER — Telehealth: Payer: Self-pay

## 2019-11-15 ENCOUNTER — Ambulatory Visit (INDEPENDENT_AMBULATORY_CARE_PROVIDER_SITE_OTHER): Payer: Medicare Other | Admitting: Nurse Practitioner

## 2019-11-15 ENCOUNTER — Other Ambulatory Visit: Payer: Self-pay

## 2019-11-15 DIAGNOSIS — E2839 Other primary ovarian failure: Secondary | ICD-10-CM

## 2019-11-15 DIAGNOSIS — Z0000 Encounter for general adult medical examination without abnormal findings: Secondary | ICD-10-CM

## 2019-11-15 NOTE — Progress Notes (Signed)
This service is provided via telemedicine  No vital signs collected/recorded due to the encounter was a telemedicine visit.   Location of patient (ex: home, work): Home  Patient consents to a telephone visit: Yes, see encounter dated 11/15/2019  Location of the provider (ex: office, home): [place]  Name of any referring provider:  [name], DO  Names of all persons participating in the telemedicine service and their role in the encounter:  Sherrie Mustache, Nurse Practitioner, Carroll Kinds, CMA, and patient.   Time spent on call:  10 minutes with medical assistant

## 2019-11-15 NOTE — Progress Notes (Signed)
Subjective:   Cynthia Watkins is a 86 y.o. female who presents for Medicare Annual (Subsequent) preventive examination.  Review of Systems     Cardiac Risk Factors include: advanced age (>22men, >39 women);hypertension     Objective:    There were no vitals filed for this visit. There is no height or weight on file to calculate BMI.  Advanced Directives 11/15/2019 10/26/2019 07/20/2019 04/06/2019 02/23/2019 01/19/2019 09/15/2017  Does Patient Have a Medical Advance Directive? No Yes Yes Yes Yes Yes Yes  Type of Advance Directive - Out of facility DNR (pink MOST or yellow form) Out of facility DNR (pink MOST or yellow form);Healthcare Power of [employer] of facility DNR (pink MOST or yellow form) Out of facility DNR (pink MOST or yellow form) Out of facility DNR (pink MOST or yellow form);Healthcare Power of [place]  Does patient want to make changes to medical advance directive? No - Patient declined No - Patient declined No - Patient declined No - Patient declined No - Patient declined No - Patient declined No - Patient declined  Copy of [place] in Chart? - - Yes - validated most recent copy scanned in chart (See row information) - - - Yes  Would patient like information on creating a medical advance directive? - - - - - - -  Pre-existing out of facility DNR order (yellow form or pink MOST form) - - Pink MOST/Yellow Form most recent copy in chart - Physician notified to receive inpatient order Pink MOST/Yellow Form most recent copy in chart - Physician notified to receive inpatient order - - -    Current Medications (verified) Outpatient Encounter Medications as of 11/15/2019  Medication Sig  . atenolol (TENORMIN) 25 MG tablet Take 1/4 tablet daily (Patient taking differently: Taking 6.25mg  Daily capsule formulary)  . Cholecalciferol (VITAMIN D3) 1000 units CAPS Take 1,000 Units by mouth daily.    No facility-administered encounter  medications on file as of 11/15/2019.    Allergies (verified) Patient has no known allergies.   History: Past Medical History:  Diagnosis Date  . Acute upper respiratory infections of unspecified site 06/09/2011  . Breast cancer ([place]) 12/11/2008  . Fatigue 01/2012  . Hypercholesterolemia    on lipitor  . Hypertension   . Hyponatremia 01/2016  . Memory loss   . Mitral valve prolapse   . Other abnormal blood chemistry 01/21/2010  . Palpitations 08/05/2000  . Right bundle branch block   . Scoliosis   . Senile osteoporosis 09/28/2001   Past Surgical History:  Procedure Laterality Date  . BREAST LUMPECTOMY Right 12/27/2007  . LAPAROSCOPIC LYSIS OF ADHESIONS  07/15/2006   [name]  . OTHER SURGICAL HISTORY    . TONSILLECTOMY    . TUBAL LIGATION  1963   Family History  Problem Relation Age of Onset  . Stroke Mother   . Cancer Father        colon  . Alzheimer's disease Sister    Social History   Socioeconomic History  . Marital status: Widowed    Spouse name: Not on file  . Number of children: 3  . Years of education: Not on file  . Highest education level: Not on file  Occupational History  . Occupation: Retired Product manager: RETIRED  Tobacco Use  . Smoking status: Former Smoker    Packs/day: 0.25    Years: 10.00    Pack years: 2.50    Quit date:  08/06/1954    Years since quitting: 65.3  . Smokeless tobacco: Never Used  Vaping Use  . Vaping Use: Never used  Substance and Sexual Activity  . Alcohol use: Yes    Comment: Occasionally   . Drug use: No  . Sexual activity: Never  Other Topics Concern  . Not on file  Social History Narrative   Lives at [facility] since 05/1998   Widowed   Living will   Former smoker, stopped 1956   Alcohol  Rare   Exercise - walk one hour daily 7 days a week,  3 days of stretching and strengthen, yoga 45 minutes 2 days a week   Social Determinants of Health   Financial Resource Strain:   . Difficulty of Paying Living  Expenses: Not on file  Food Insecurity:   . Worried About Charity fundraiser in the Last Year: Not on file  . Ran Out of Food in the Last Year: Not on file  Transportation Needs:   . Lack of Transportation (Medical): Not on file  . Lack of Transportation (Non-Medical): Not on file  Physical Activity:   . Days of Exercise per Week: Not on file  . Minutes of Exercise per Session: Not on file  Stress:   . Feeling of Stress : Not on file  Social Connections:   . Frequency of Communication with Friends and Family: Not on file  . Frequency of Social Gatherings with Friends and Family: Not on file  . Attends Religious Services: Not on file  . Active Member of Clubs or Organizations: Not on file  . Attends Archivist Meetings: Not on file  . Marital Status: Not on file    Tobacco Counseling Counseling given: Not Answered   Clinical Intake:  Pre-visit preparation completed: Yes  Pain : No/denies pain     BMI - recorded: 22 Nutritional Status: BMI of 19-24  Normal Nutritional Risks: None Diabetes: No  How often do you need to have someone help you when you read instructions, pamphlets, or other written materials from your doctor or pharmacy?: 1 - Never  Diabetic?no         Activities of Daily Living In your present state of health, do you have any difficulty performing the following activities: 11/15/2019  Hearing? Y  Vision? N  Difficulty concentrating or making decisions? N  Walking or climbing stairs? N  Dressing or bathing? N  Doing errands, shopping? N  Preparing Food and eating ? N  Using the Toilet? N  In the past six months, have you accidently leaked urine? N  Do you have problems with loss of bowel control? N  Managing your Medications? N  Managing your Finances? N  Housekeeping or managing your Housekeeping? N  Some recent data might be hidden    Patient Care Team: Gayland Curry, DO as PCP - General (Geriatric Medicine) Martinique, Peter M,  MD as PCP - Cardiology (Cardiology) Prentiss Bells, MD as Consulting Physician (Ophthalmology) Martinique, Peter M, MD as Consulting Physician (Cardiology) Allyn Kenner, MD as Consulting Physician (Dermatology) Michael Boston, MD as Consulting Physician (General Surgery) Community, Well Loreli Slot, MD as Consulting Physician (Hematology and Oncology) Magrinat, Virgie Dad, MD as Consulting Physician (Oncology) [name], MD as Consulting Physician (Ophthalmology)  Indicate any recent Medical Services you may have received from other than [facility] providers in the past year (date may be approximate).     Assessment:   This is a routine wellness examination  for [employer].  Hearing/Vision screen  Hearing Screening   125Hz  250Hz  500Hz  1000Hz  2000Hz  3000Hz  4000Hz  6000Hz  8000Hz   Right ear:           Left ear:           Comments: Patient wears hearing aids  Vision Screening Comments: Patient wears glasses, but no vision problems  Dietary issues and exercise activities discussed: Current Exercise Habits: Home exercise routine, Type of exercise: walking, Time (Minutes): 60, Frequency (Times/Week): 7, Weekly Exercise (Minutes/Week): 420  Goals    . increase water     Patient will increase water and herbal tea intake    . Patient Stated     To continue to stay active.       Depression Screen PHQ 2/9 Scores 11/15/2019 10/26/2019 02/23/2019 01/19/2019 11/11/2018 06/02/2018 02/03/2018  PHQ - 2 Score 0 0 0 0 0 0 0    Fall Risk Fall Risk  11/15/2019 10/26/2019 07/20/2019 04/06/2019 02/23/2019  Falls in the past year? 0 0 0 - 0  Number falls in past yr: 0 0 - 0 0  Injury with Fall? 0 0 0 0 0  Comment - - - - -    Any stairs in or around the home? Yes  If so, are there any without handrails? No  Home free of loose throw rugs in walkways, pet beds, electrical cords, etc? Yes  Adequate lighting in your home to reduce risk of falls? Yes   ASSISTIVE DEVICES UTILIZED TO PREVENT  FALLS:  Life alert? No  Use of a cane, walker or w/c? No  Grab bars in the bathroom? Yes  Shower chair or bench in shower? No  Elevated toilet seat or a handicapped toilet? Yes   TIMED UP AND GO:  Was the test performed? No .   Cognitive Function: MMSE - Mini Mental State Exam 02/23/2019 09/15/2017 08/06/2016 08/15/2015 02/14/2014  Orientation to time 4 4 5 4 5   Orientation to Place 4 5 5 5 5   Registration 3 3 3 3 3   Attention/ Calculation 5 5 5 5 5   Recall 3 3 3 3 3   Language- name 2 objects 2 2 2 2 2   Language- repeat 1 1 1 1 1   Language- follow 3 step command 3 3 3 3 3   Language- read & follow direction 1 1 1 1 1   Write a sentence 1 1 1 1 1   Copy design 1 1 1 1 1   Total score 28 29 30 29 30      6CIT Screen 11/15/2019 11/11/2018  What Year? 0 points 0 points  What month? 0 points 0 points  What time? 0 points 0 points  Count back from 20 0 points 0 points  Months in reverse 0 points 0 points  Repeat phrase 2 points 0 points  Total Score 2 0    Immunizations Immunization History  Administered Date(s) Administered  . Influenza Split 10/23/2011  . Influenza, High Dose Seasonal PF 09/20/2015, 10/09/2016, 10/15/2018  . Influenza,inj,Quad PF,6+ Mos 10/28/2013, 10/30/2017  . Influenza-Unspecified 10/06/2012, 10/26/2014  . Moderna SARS-COVID-2 Vaccination 01/16/2019, 02/15/2019  . Pneumococcal Conjugate-13 02/14/2014  . Pneumococcal Polysaccharide-23 01/06/1997  . Zoster 01/07/1999  . Zoster Recombinat (Shingrix) 10/09/2016, 09/17/2017, 01/14/2018    TDAP status: Due, Education has been provided regarding the importance of this vaccine. Advised may receive this vaccine at local pharmacy or Health Dept. Aware to provide a copy of the vaccination record if obtained from local pharmacy or Health Dept. Verbalized acceptance and  understanding. Flu shot has been scheduled Pneumococcal vaccine status: Up to date Covid-19 vaccine status: Information provided on how to obtain vaccines.    Qualifies for Shingles Vaccine? Yes   Zostavax completed Yes   Shingrix Completed?: Yes  Screening Tests Health Maintenance  Topic Date Due  . INFLUENZA VACCINE  08/07/2019  . TETANUS/TDAP  07/19/2020 (Originally 05/16/1943)  . DEXA SCAN  Completed  . COVID-19 Vaccine  Completed  . PNA vac Low Risk Adult  Completed    Health Maintenance  Health Maintenance Due  Topic Date Due  . INFLUENZA VACCINE  08/07/2019    Colorectal cancer screening: No longer required.  Mammogram status: No longer required.  Bone Density status: Completed 03/2017. Results reflect: Bone density results: OSTEOPOROSIS. Repeat every 2 years.  Lung Cancer Screening: (Low Dose CT Chest recommended if Age 52-80 years, 30 pack-year currently smoking OR have quit w/in 15years.) does not qualify.   Lung Cancer Screening Referral: na  Additional Screening:  Hepatitis C Screening: does not qualify; Completed na  Vision Screening: Recommended annual ophthalmology exams for early detection of glaucoma and other disorders of the eye. Is the patient up to date with their annual eye exam?  No  Who is the provider or what is the name of the office in which the patient attends annual eye exams? Unknown If pt is not established with a provider, would they like to be referred to a provider to establish care? No .   Dental Screening: Recommended annual dental exams for proper oral hygiene  Community Resource Referral / Chronic Care Management: CRR required this visit?  No   CCM required this visit?  No      Plan:     I have personally reviewed and noted the following in the patient's chart:   . Medical and social history . Use of alcohol, tobacco or illicit drugs  . Current medications and supplements . Functional ability and status . Nutritional status . Physical activity . Advanced directives . List of other physicians . Hospitalizations, surgeries, and ER visits in previous 12  months . Vitals . Screenings to include cognitive, depression, and falls . Referrals and appointments  In addition, I have reviewed and discussed with patient certain preventive protocols, quality metrics, and best practice recommendations. A written personalized care plan for preventive services as well as general preventive health recommendations were provided to patient.     Lauree Chandler, NP   11/15/2019    Virtual Visit via Telephone Note  I connected with@ on 11/15/19 at  9:30 AM EST by telephone and verified that I am speaking with the correct person using two identifiers.  Location: Patient: home Provider: [place].    I discussed the limitations, risks, security and privacy concerns of performing an evaluation and management service by telephone and the availability of in person appointments. I also discussed with the patient that there may be a patient responsible charge related to this service. The patient expressed understanding and agreed to proceed.   I discussed the assessment and treatment plan with the patient. The patient was provided an opportunity to ask questions and all were answered. The patient agreed with the plan and demonstrated an understanding of the instructions.   The patient was advised to call back or seek an in-person evaluation if the symptoms worsen or if the condition fails to improve as anticipated.  I provided 15 minutes of non-face-to-face time during this encounter.  Carlos [employer]. Dewaine Oats, [place] printed  and mailed

## 2019-11-15 NOTE — Patient Instructions (Signed)
Cynthia Watkins , Thank you for taking time to come for your Medicare Wellness Visit. I appreciate your ongoing commitment to your health goals. Please review the following plan we discussed and let me know if I can assist you in the future.   Screening recommendations/referrals: Colonoscopy aged out Mammogram aged out Bone Density order placed - call [facility] to make appt Recommended yearly ophthalmology/optometry visit for glaucoma screening and checkup Recommended yearly dental visit for hygiene and checkup  Vaccinations: Influenza vaccine recommended at this time.  Pneumococcal vaccine up to date Tdap vaccine RECOMMENDED- to get at your local pharmacy Shingles vaccine up to date    Advanced directives: on file.   Conditions/risks identified: advanced age, hypertension.  Next appointment: 1 year   Preventive Care 29 Years and Older, Female Preventive care refers to lifestyle choices and visits with your health care provider that can promote health and wellness. What does preventive care include?  A yearly physical exam. This is also called an annual well check.  Dental exams once or twice a year.  Routine eye exams. Ask your health care provider how often you should have your eyes checked.  Personal lifestyle choices, including:  Daily care of your teeth and gums.  Regular physical activity.  Eating a healthy diet.  Avoiding tobacco and drug use.  Limiting alcohol use.  Practicing safe sex.  Taking low-dose aspirin every day.  Taking vitamin and mineral supplements as recommended by your health care provider. What happens during an annual well check? The services and screenings done by your health care provider during your annual well check will depend on your age, overall health, lifestyle risk factors, and family history of disease. Counseling  Your health care provider may ask you questions about your:  Alcohol use.  Tobacco use.  Drug use.  Emotional  well-being.  Home and relationship well-being.  Sexual activity.  Eating habits.  History of falls.  Memory and ability to understand (cognition).  Work and work Statistician.  Reproductive health. Screening  You may have the following tests or measurements:  Height, weight, and BMI.  Blood pressure.  Lipid and cholesterol levels. These may be checked every 5 years, or more frequently if you are over 31 years old.  Skin check.  Lung cancer screening. You may have this screening every year starting at age 9 if you have a 30-pack-year history of smoking and currently smoke or have quit within the past 15 years.  Fecal occult blood test (FOBT) of the stool. You may have this test every year starting at age 65.  Flexible sigmoidoscopy or colonoscopy. You may have a sigmoidoscopy every 5 years or a colonoscopy every 10 years starting at age 66.  Hepatitis C blood test.  Hepatitis B blood test.  Sexually transmitted disease (STD) testing.  Diabetes screening. This is done by checking your blood sugar (glucose) after you have not eaten for a while (fasting). You may have this done every 1-3 years.  Bone density scan. This is done to screen for osteoporosis. You may have this done starting at age 59.  Mammogram. This may be done every 1-2 years. Talk to your health care provider about how often you should have regular mammograms. Talk with your health care provider about your test results, treatment options, and if necessary, the need for more tests. Vaccines  Your health care provider may recommend certain vaccines, such as:  Influenza vaccine. This is recommended every year.  Tetanus, diphtheria, and acellular pertussis (Tdap,  Td) vaccine. You may need a Td booster every 10 years.  Zoster vaccine. You may need this after age 20.  Pneumococcal 13-valent conjugate (PCV13) vaccine. One dose is recommended after age 70.  Pneumococcal polysaccharide (PPSV23) vaccine. One  dose is recommended after age 52. Talk to your health care provider about which screenings and vaccines you need and how often you need them. This information is not intended to replace advice given to you by your health care provider. Make sure you discuss any questions you have with your health care provider. Document Released: 01/19/2015 Document Revised: 09/12/2015 Document Reviewed: 10/24/2014 Elsevier Interactive Patient Education  2017 [employer] Prevention in the Home Falls can cause injuries. They can happen to people of all ages. There are many things you can do to make your home safe and to help prevent falls. What can I do on the outside of my home?  Regularly fix the edges of walkways and driveways and fix any cracks.  Remove anything that might make you trip as you walk through a door, such as a raised step or threshold.  Trim any bushes or trees on the path to your home.  Use bright outdoor lighting.  Clear any walking paths of anything that might make someone trip, such as rocks or tools.  Regularly check to see if handrails are loose or broken. Make sure that both sides of any steps have handrails.  Any raised decks and porches should have guardrails on the edges.  Have any leaves, snow, or ice cleared regularly.  Use sand or salt on walking paths during winter.  Clean up any spills in your garage right away. This includes oil or grease spills. What can I do in the bathroom?  Use night lights.  Install grab bars by the toilet and in the tub and shower. Do not use towel bars as grab bars.  Use non-skid mats or decals in the tub or shower.  If you need to sit down in the shower, use a plastic, non-slip stool.  Keep the floor dry. Clean up any water that spills on the floor as soon as it happens.  Remove soap buildup in the tub or shower regularly.  Attach bath mats securely with double-sided non-slip rug tape.  Do not have throw rugs and other  things on the floor that can make you trip. What can I do in the bedroom?  Use night lights.  Make sure that you have a light by your bed that is easy to reach.  Do not use any sheets or blankets that are too big for your bed. They should not hang down onto the floor.  Have a firm chair that has side arms. You can use this for support while you get dressed.  Do not have throw rugs and other things on the floor that can make you trip. What can I do in the kitchen?  Clean up any spills right away.  Avoid walking on wet floors.  Keep items that you use a lot in easy-to-reach places.  If you need to reach something above you, use a strong step stool that has a grab bar.  Keep electrical cords out of the way.  Do not use floor polish or wax that makes floors slippery. If you must use wax, use non-skid floor wax.  Do not have throw rugs and other things on the floor that can make you trip. What can I do with my stairs?  Do not  leave any items on the stairs.  Make sure that there are handrails on both sides of the stairs and use them. Fix handrails that are broken or loose. Make sure that handrails are as long as the stairways.  Check any carpeting to make sure that it is firmly attached to the stairs. Fix any carpet that is loose or worn.  Avoid having throw rugs at the top or bottom of the stairs. If you do have throw rugs, attach them to the floor with carpet tape.  Make sure that you have a light switch at the top of the stairs and the bottom of the stairs. If you do not have them, ask someone to add them for you. What else can I do to help prevent falls?  Wear shoes that:  Do not have high heels.  Have rubber bottoms.  Are comfortable and fit you well.  Are closed at the toe. Do not wear sandals.  If you use a stepladder:  Make sure that it is fully opened. Do not climb a closed stepladder.  Make sure that both sides of the stepladder are locked into place.  Ask  someone to hold it for you, if possible.  Clearly mark and make sure that you can see:  Any grab bars or handrails.  First and last steps.  Where the edge of each step is.  Use tools that help you move around (mobility aids) if they are needed. These include:  Canes.  Walkers.  Scooters.  Crutches.  Turn on the lights when you go into a dark area. Replace any light bulbs as soon as they burn out.  Set up your furniture so you have a clear path. Avoid moving your furniture around.  If any of your floors are uneven, fix them.  If there are any pets around you, be aware of where they are.  Review your medicines with your doctor. Some medicines can make you feel dizzy. This can increase your chance of falling. Ask your doctor what other things that you can do to help prevent falls. This information is not intended to replace advice given to you by your health care provider. Make sure you discuss any questions you have with your health care provider. Document Released: 10/19/2008 Document Revised: 05/31/2015 Document Reviewed: 01/27/2014 Elsevier Interactive Patient Education  2017 [employer].

## 2019-11-15 NOTE — Telephone Encounter (Signed)
[name], Cynthia Watkins are scheduled for a virtual visit with your provider today.    Just as we do with appointments in the office, we must obtain your consent to participate.  Your consent will be active for this visit and any virtual visit you may have with one of our providers in the next 365 days.    If you have a MyChart account, I can also send a copy of this consent to you electronically.  All virtual visits are billed to your insurance company just like a traditional visit in the office.  As this is a virtual visit, video technology does not allow for your provider to perform a traditional examination.  This may limit your provider's ability to fully assess your condition.  If your provider identifies any concerns that need to be evaluated in person or the need to arrange testing such as labs, EKG, etc, we will make arrangements to do so.    Although advances in technology are sophisticated, we cannot ensure that it will always work on either your end or our end.  If the connection with a video visit is poor, we may have to switch to a telephone visit.  With either a video or telephone visit, we are not always able to ensure that we have a secure connection.   I need to obtain your verbal consent now.   Are you willing to proceed with your visit today?   Cynthia Watkins has provided verbal consent on 11/15/2019 for a virtual visit (video or telephone).   Carroll Kinds, [facility] 11/15/2019  9:19 AM

## 2020-01-17 DIAGNOSIS — I10 Essential (primary) hypertension: Secondary | ICD-10-CM | POA: Diagnosis not present

## 2020-01-17 LAB — BASIC METABOLIC PANEL
BUN: 22 — AB (ref 4–21)
CO2: 25 — AB (ref 13–22)
Chloride: 101 (ref 99–108)
Creatinine: 0.7 (ref 0.5–1.1)
Glucose: 104
Potassium: 4.6 (ref 3.4–5.3)
Sodium: 137 (ref 137–147)

## 2020-01-17 LAB — COMPREHENSIVE METABOLIC PANEL: Calcium: 9.1 (ref 8.7–10.7)

## 2020-01-18 ENCOUNTER — Encounter: Payer: Self-pay | Admitting: *Deleted

## 2020-01-25 ENCOUNTER — Non-Acute Institutional Stay: Payer: Medicare Other | Admitting: Internal Medicine

## 2020-01-25 ENCOUNTER — Encounter: Payer: Self-pay | Admitting: Internal Medicine

## 2020-01-25 ENCOUNTER — Other Ambulatory Visit: Payer: Self-pay

## 2020-01-25 VITALS — BP 118/72 | HR 66 | Temp 97.7°F | Ht 61.0 in | Wt 116.8 lb

## 2020-01-25 DIAGNOSIS — F0390 Unspecified dementia without behavioral disturbance: Secondary | ICD-10-CM

## 2020-01-25 DIAGNOSIS — E871 Hypo-osmolality and hyponatremia: Secondary | ICD-10-CM | POA: Diagnosis not present

## 2020-01-25 DIAGNOSIS — I10 Essential (primary) hypertension: Secondary | ICD-10-CM | POA: Diagnosis not present

## 2020-01-25 DIAGNOSIS — M1612 Unilateral primary osteoarthritis, left hip: Secondary | ICD-10-CM

## 2020-01-25 NOTE — Progress Notes (Signed)
Location:  [facility] of Service:  Clinic (12)  Provider: Geremiah Fussell L. Mariea Clonts, D.O., C.M.D.  Code Status: DNR Goals of Care:  Advanced Directives 01/25/2020  Does Patient Have a Medical Advance Directive? Yes  Type of Advance Directive [place]  Does patient want to make changes to medical advance directive? No - Patient declined  Copy of [place] in Chart? Yes - validated most recent copy scanned in chart (See row information)  Would patient like information on creating a medical advance directive? -  Pre-existing out of facility DNR order (yellow form or pink MOST form) -     Chief Complaint  Patient presents with  . Medical Management of Chronic Issues    3 month follow up    HPI: Patient is a 87 y.o. female seen today for medical management of chronic diseases.    She has stayed well.  She doesn't go anywhere.  Na was 137 this time.    Back and hip are doing ok.  Still doing her walks inside.    She got 2 pts on her 6 CIT--could not repeat the phrase properly.  She wishes her memory was better, but she says she is 40.  She thinks things are about the same as 3 months ago.  I have not gotten any messages of concern from her son who had said that he'd let me know if he was worried about her when he visits.  [place] nurses also had no concerns.                                                                   Past Medical History:  Diagnosis Date  . Acute upper respiratory infections of unspecified site 06/09/2011  . Breast cancer ([place]) 12/11/2008  . Fatigue 01/2012  . Hypercholesterolemia    on lipitor  . Hypertension   . Hyponatremia 01/2016  . Memory loss   . Mitral valve prolapse   . Other abnormal blood chemistry 01/21/2010  . Palpitations 08/05/2000  . Right bundle branch block   . Scoliosis   . Senile osteoporosis 09/28/2001    Past Surgical History:  Procedure Laterality Date  . BREAST LUMPECTOMY  Right 12/27/2007  . LAPAROSCOPIC LYSIS OF ADHESIONS  07/15/2006   [name]  . OTHER SURGICAL HISTORY    . TONSILLECTOMY    . TUBAL LIGATION  1963    No Known Allergies  Outpatient Encounter Medications as of 01/25/2020  Medication Sig  . atenolol (TENORMIN) 25 MG tablet Take 1/4 tablet daily (Patient taking differently: Taking 6.25mg  Daily capsule formulary)  . Cholecalciferol (VITAMIN D3) 1000 units CAPS Take 1,000 Units by mouth daily.    No facility-administered encounter medications on file as of 01/25/2020.    Review of Systems:  Review of Systems  Constitutional: Negative for chills, fever and malaise/fatigue.  HENT: Positive for hearing loss. Negative for congestion and sore throat.   Eyes: Negative for blurred vision.  Respiratory: Negative for cough and shortness of breath.   Cardiovascular: Negative for chest pain, palpitations and leg swelling.  Gastrointestinal: Negative for abdominal pain, constipation and diarrhea.  Genitourinary: Negative for dysuria.       Not reporting changes in urination (  OAB previously reported)  Musculoskeletal: Negative for back pain, falls and joint pain.  Skin: Negative for rash.  Neurological: Negative for dizziness and loss of consciousness.  Endo/Heme/Allergies: Bruises/bleeds easily.  Psychiatric/Behavioral: Positive for memory loss. Negative for depression. The patient is nervous/anxious. The patient does not have insomnia.     Health Maintenance  Topic Date Due  . INFLUENZA VACCINE  08/07/2019  . TETANUS/TDAP  07/19/2020 (Originally 05/16/1943)  . COVID-19 Vaccine (4 - Booster for Moderna series) 05/21/2020  . DEXA SCAN  Completed  . PNA vac Low Risk Adult  Completed    Physical Exam: Vitals:   01/25/20 1133  BP: 118/72  Pulse: 66  Temp: 97.7 F (36.5 C)  SpO2: 95%  Weight: 116 lb 12.8 oz (53 kg)  Height: 5\' 1"  (1.549 m)   Body mass index is 22.07 kg/m. Physical Exam Vitals reviewed.  Constitutional:       Appearance: Normal appearance.  HENT:     Head: Normocephalic and atraumatic.  Eyes:     Conjunctiva/sclera: Conjunctivae normal.     Pupils: Pupils are equal, round, and reactive to light.     Comments: glasses  Cardiovascular:     Rate and Rhythm: Normal rate and regular rhythm.     Pulses: Normal pulses.     Heart sounds: Normal heart sounds.  Pulmonary:     Effort: Pulmonary effort is normal.     Breath sounds: Normal breath sounds. No wheezing, rhonchi or rales.  Abdominal:     General: Bowel sounds are normal.  Musculoskeletal:        General: Normal range of motion.     Right lower leg: No edema.     Left lower leg: No edema.  Neurological:     General: No focal deficit present.     Mental Status: She is alert and oriented to person, place, and time.     Comments: But short-term memory loss, makes herself notes to keep track of things; continues to walk regularly, kyphosis  Psychiatric:        Mood and Affect: Mood normal.     Comments: Quiet, pleasant     Labs reviewed: Basic Metabolic Panel: Recent Labs    03/22/19 0000 07/12/19 0806 10/20/19 0000 01/17/20 0000  NA  --  135* 138 137  K  --  4.3 4.2 4.6  CL  --  98* 102 101  CO2  --  34* 27* 25*  BUN  --  21 25* 22*  CREATININE  --  0.7 0.7 0.7  CALCIUM  --  9.4 9.4 9.1  TSH 2.18  2.18  --   --   --    Liver Function Tests: Recent Labs    02/22/19 0500  ALBUMIN 4.1   No results for input(s): LIPASE, AMYLASE in the last 8760 hours. No results for input(s): AMMONIA in the last 8760 hours. CBC: Recent Labs    02/22/19 0500  WBC 6.3  HGB 13.7  HCT 40  PLT 456*   Lipid Panel: Recent Labs    02/22/19 0500  CHOL 295*  HDL 53  LDLCALC 213  TRIG 148   Lab Results  Component Value Date   HGBA1C 5.9 (H) 01/23/2016    Procedures since last visit: No results found.  Assessment/Plan 1. Hyponatremia -has been stable for a long time now -she likes this checked each time (worries about it  b/c it caused considerable delirium at one time and she was hospitalized and then  in rehab)  2. Major neurocognitive disorder ([place]) -does have dementia, but remains functional at home -able to do her daily activities and routine with notes as reminders  -her son is urologist and keeps a close eye on her  3. Primary osteoarthritis of left hip -not recently bothersome  4. Essential hypertension, benign -bp at goal with her current regimen and without dizziness  Labs/tests ordered:  Bmp before Next appt:  04/25/2020  Jesaiah Fabiano L. Stephane Junkins, D.O. [facility] [address], [place] [address] Cell Phone (Mon-Fri 8am-5pm):  [phone] On Call:  [phone] & follow prompts after 5pm & weekends Office Phone:  [phone] Office Fax:  [phone]

## 2020-02-27 ENCOUNTER — Encounter: Payer: Self-pay | Admitting: Internal Medicine

## 2020-04-19 DIAGNOSIS — I10 Essential (primary) hypertension: Secondary | ICD-10-CM | POA: Diagnosis not present

## 2020-04-19 LAB — BASIC METABOLIC PANEL
BUN: 16 (ref 4–21)
CO2: 27 — AB (ref 13–22)
Chloride: 100 (ref 99–108)
Creatinine: 0.8 (ref 0.5–1.1)
Glucose: 93
Potassium: 4.5 (ref 3.4–5.3)
Sodium: 137 (ref 137–147)

## 2020-04-19 LAB — COMPREHENSIVE METABOLIC PANEL: Calcium: 9.8 (ref 8.7–10.7)

## 2020-04-25 ENCOUNTER — Encounter: Payer: Self-pay | Admitting: Internal Medicine

## 2020-04-25 ENCOUNTER — Encounter: Payer: Self-pay | Admitting: *Deleted

## 2020-04-30 ENCOUNTER — Encounter: Payer: Self-pay | Admitting: Adult Health

## 2020-04-30 ENCOUNTER — Other Ambulatory Visit: Payer: Self-pay

## 2020-04-30 ENCOUNTER — Non-Acute Institutional Stay: Payer: Medicare Other | Admitting: Adult Health

## 2020-04-30 VITALS — BP 106/62 | HR 65 | Temp 96.4°F | Ht 61.0 in | Wt 118.2 lb

## 2020-04-30 DIAGNOSIS — E871 Hypo-osmolality and hyponatremia: Secondary | ICD-10-CM

## 2020-04-30 DIAGNOSIS — H9193 Unspecified hearing loss, bilateral: Secondary | ICD-10-CM

## 2020-04-30 DIAGNOSIS — R413 Other amnesia: Secondary | ICD-10-CM | POA: Diagnosis not present

## 2020-04-30 DIAGNOSIS — R002 Palpitations: Secondary | ICD-10-CM | POA: Diagnosis not present

## 2020-04-30 DIAGNOSIS — E7800 Pure hypercholesterolemia, unspecified: Secondary | ICD-10-CM | POA: Diagnosis not present

## 2020-04-30 DIAGNOSIS — M810 Age-related osteoporosis without current pathological fracture: Secondary | ICD-10-CM | POA: Diagnosis not present

## 2020-04-30 NOTE — Progress Notes (Signed)
Location:  [facility]  Provider:  Cindi Carbon, [place] [phone]   Code Status: DNR Goals of Care:  Advanced Directives 01/25/2020  Does Patient Have a Medical Advance Directive? Yes  Type of Advance Directive [place]  Does patient want to make changes to medical advance directive? No - Patient declined  Copy of [place] in Chart? Yes - validated most recent copy scanned in chart (See row information)  Would patient like information on creating a medical advance directive? -  Pre-existing out of facility DNR order (yellow form or pink MOST form) -     Chief Complaint  Patient presents with  . Medical Management of Chronic Issues    Patient returns to the clinic for follow up. She has no concerns.     HPI: Patient is a 87 y.o. female seen today for medical management of chronic diseases.    Continues to exercise walking 15 min interval 4 x day  Continues to remain independent in ADLs but has some help with her bills  Takes care of her own meds. Was on time to her apt and appropriate dressed. No longer drives.   Eats in the dining room most of the time   Denies any joint pain or other acute concerns  Gets sodium checked due to prior hx of low sodium which has been stable 137  Has a hx of palpitations and is currently on atenolol. States this is a rare occurrence and has not had any lately   Past Medical History:  Diagnosis Date  . Acute upper respiratory infections of unspecified site 06/09/2011  . Breast cancer ([place]) 12/11/2008  . Fatigue 01/2012  . Hypercholesterolemia    on lipitor  . Hypertension   . Hyponatremia 01/2016  . Memory loss   . Mitral valve prolapse   . Other abnormal blood chemistry 01/21/2010  . Palpitations 08/05/2000  . Right bundle branch block   . Scoliosis   . Senile osteoporosis 09/28/2001    Past Surgical History:  Procedure Laterality Date  . BREAST LUMPECTOMY Right  12/27/2007  . LAPAROSCOPIC LYSIS OF ADHESIONS  07/15/2006   [name]  . OTHER SURGICAL HISTORY    . TONSILLECTOMY    . TUBAL LIGATION  1963    No Known Allergies  Outpatient Encounter Medications as of 04/30/2020  Medication Sig  . atenolol (TENORMIN) 25 MG tablet Take 1/4 tablet daily (Patient taking differently: Taking 6.25mg  Daily capsule formulary)  . Cholecalciferol (VITAMIN D3) 1000 units CAPS Take 1,000 Units by mouth daily.    No facility-administered encounter medications on file as of 04/30/2020.    Review of Systems:  Review of Systems  Constitutional: Negative for activity change, appetite change, chills, diaphoresis, fatigue, fever and unexpected weight change.  HENT: Positive for hearing loss. Negative for congestion.   Respiratory: Negative for cough, shortness of breath and wheezing.   Cardiovascular: Negative for chest pain, palpitations (rare occasions) and leg swelling.  Gastrointestinal: Negative for abdominal distention, abdominal pain, constipation and diarrhea.  Genitourinary: Negative for difficulty urinating and dysuria.  Musculoskeletal: Negative for arthralgias, back pain, gait problem, joint swelling and myalgias.  Neurological: Negative for dizziness, tremors, seizures, syncope, facial asymmetry, speech difficulty, weakness, light-headedness, numbness and headaches.  Psychiatric/Behavioral: Negative for agitation, behavioral problems and confusion.       Mild memory loss    Health Maintenance  Topic Date Due  . TETANUS/TDAP  07/19/2020 (Originally 05/16/1943)  . COVID-19  Vaccine (4 - Booster for Moderna series) 05/21/2020  . INFLUENZA VACCINE  08/06/2020  . DEXA SCAN  Completed  . PNA vac Low Risk Adult  Completed  . HPV VACCINES  Aged Out    Physical Exam: Vitals:   04/30/20 1258  BP: 106/62  Pulse: 65  Temp: (!) 96.4 F (35.8 C)  SpO2: 98%  Weight: 118 lb 3.2 oz (53.6 kg)  Height: 5\' 1"  (1.549 m)   Body mass index is 22.33 kg/m.  Wt  Readings from Last 3 Encounters:  04/30/20 118 lb 3.2 oz (53.6 kg)  01/25/20 116 lb 12.8 oz (53 kg)  10/26/19 121 lb (54.9 kg)    Physical Exam Vitals and nursing note reviewed.  Constitutional:      General: She is not in acute distress.    Appearance: She is not diaphoretic.  HENT:     Head: Normocephalic and atraumatic.     Nose: Nose normal. No congestion.     Mouth/Throat:     Mouth: Mucous membranes are moist.     Pharynx: No oropharyngeal exudate.  Eyes:     General:        Right eye: No discharge.        Left eye: No discharge.     Conjunctiva/sclera: Conjunctivae normal.     Pupils: Pupils are equal, round, and reactive to light.  Neck:     Vascular: No JVD.  Cardiovascular:     Rate and Rhythm: Normal rate and regular rhythm.     Heart sounds: No murmur heard.   Pulmonary:     Effort: Pulmonary effort is normal. No respiratory distress.     Breath sounds: Normal breath sounds. No wheezing.  Abdominal:     General: Bowel sounds are normal. There is no distension.     Palpations: Abdomen is soft.     Tenderness: There is no abdominal tenderness.  Musculoskeletal:        General: No swelling, tenderness or signs of injury.     Cervical back: No rigidity or tenderness.     Comments: BLE trace edema Mild kyphosis   Lymphadenopathy:     Cervical: No cervical adenopathy.  Skin:    General: Skin is warm and dry.  Neurological:     General: No focal deficit present.     Mental Status: She is alert and oriented to person, place, and time. Mental status is at baseline.  Psychiatric:        Mood and Affect: Mood normal.     Labs reviewed: Basic Metabolic Panel: Recent Labs    10/20/19 0000 01/17/20 0000 04/19/20 0000  NA 138 137 137  K 4.2 4.6 4.5  CL 102 101 100  CO2 27* 25* 27*  BUN 25* 22* 16  CREATININE 0.7 0.7 0.8  CALCIUM 9.4 9.1 9.8   Liver Function Tests: No results for input(s): AST, ALT, ALKPHOS, BILITOT, PROT, ALBUMIN in the last 8760  hours. No results for input(s): LIPASE, AMYLASE in the last 8760 hours. No results for input(s): AMMONIA in the last 8760 hours. CBC: No results for input(s): WBC, NEUTROABS, HGB, HCT, MCV, PLT in the last 8760 hours. Lipid Panel: No results for input(s): CHOL, HDL, LDLCALC, TRIG, CHOLHDL, LDLDIRECT in the last 8760 hours. Lab Results  Component Value Date   HGBA1C 5.9 (H) 01/23/2016    Procedures since last visit: No results found.  Assessment/Plan  1. Memory loss Mild Remains able to perform ADLs in her home Continue  supportive environment at [facility] and her family is welcome to let us know if there are any concerns Follow q 4 months   2. Senile osteoporosis Ordered  - DG Bone Density; Future  3. Hyponatremia NA 137 Recheck at next visit and if remains stable could reduce frequency of checks   4. Bilateral hearing loss, unspecified hearing loss type Wears hearing aides   5. Palpitations Controlled with atenolol   6. Hypercholesterolemia Lab Results  Component Value Date   CHOL 295 (A) 02/22/2019   HDL 53 02/22/2019   LDLCALC 213 02/22/2019   TRIG 148 02/22/2019   CHOLHDL 3.5 01/27/2016   Check lipid panel at next visit Continues to exercise  Labs/tests ordered:  CBC BMP lipid panel prior to next apt Next appt:  4 months with Dr. Lyndel Safe

## 2020-04-30 NOTE — Patient Instructions (Signed)
Recommend bone density testing  Great job on continuing to exercise  Lets [place] know if you need help at home  Come back in 4 months to see Dr. Lyndel Safe with labs If your sodium is still doing well we can cut down on how often we check it

## 2020-05-21 ENCOUNTER — Other Ambulatory Visit: Payer: Self-pay

## 2020-05-21 ENCOUNTER — Encounter (HOSPITAL_BASED_OUTPATIENT_CLINIC_OR_DEPARTMENT_OTHER): Payer: Self-pay | Admitting: Emergency Medicine

## 2020-05-21 ENCOUNTER — Emergency Department (HOSPITAL_BASED_OUTPATIENT_CLINIC_OR_DEPARTMENT_OTHER): Payer: Medicare Other | Admitting: Radiology

## 2020-05-21 ENCOUNTER — Emergency Department (HOSPITAL_BASED_OUTPATIENT_CLINIC_OR_DEPARTMENT_OTHER)
Admission: EM | Admit: 2020-05-21 | Discharge: 2020-05-21 | Disposition: A | Discharge status: Home / Self Care | Payer: Medicare Other | Attending: Emergency Medicine | Admitting: Emergency Medicine

## 2020-05-21 DIAGNOSIS — Z853 Personal history of malignant neoplasm of breast: Secondary | ICD-10-CM | POA: Diagnosis not present

## 2020-05-21 DIAGNOSIS — Z79899 Other long term (current) drug therapy: Secondary | ICD-10-CM | POA: Diagnosis not present

## 2020-05-21 DIAGNOSIS — Z87891 Personal history of nicotine dependence: Secondary | ICD-10-CM | POA: Insufficient documentation

## 2020-05-21 DIAGNOSIS — R002 Palpitations: Secondary | ICD-10-CM | POA: Insufficient documentation

## 2020-05-21 DIAGNOSIS — I10 Essential (primary) hypertension: Secondary | ICD-10-CM | POA: Insufficient documentation

## 2020-05-21 LAB — CBC
HCT: 42 % (ref 36.0–46.0)
Hemoglobin: 14 g/dL (ref 12.0–15.0)
MCH: 33.1 pg (ref 26.0–34.0)
MCHC: 33.3 g/dL (ref 30.0–36.0)
MCV: 99.3 fL (ref 80.0–100.0)
Platelets: 283 10*3/uL (ref 150–400)
RBC: 4.23 MIL/uL (ref 3.87–5.11)
RDW: 14.4 % (ref 11.5–15.5)
WBC: 7.3 10*3/uL (ref 4.0–10.5)
nRBC: 0 % (ref 0.0–0.2)

## 2020-05-21 LAB — BASIC METABOLIC PANEL
Anion gap: 9 (ref 5–15)
BUN: 22 mg/dL (ref 8–23)
CO2: 24 mmol/L (ref 22–32)
Calcium: 9 mg/dL (ref 8.9–10.3)
Chloride: 102 mmol/L (ref 98–111)
Creatinine, Ser: 0.75 mg/dL (ref 0.44–1.00)
GFR, Estimated: >60 mL/min (ref >60)
Glucose, Bld: 97 mg/dL (ref 70–99)
Potassium: 4.1 mmol/L (ref 3.5–5.1)
Sodium: 135 mmol/L (ref 135–145)

## 2020-05-21 LAB — TROPONIN I (HIGH SENSITIVITY): Troponin I (High Sensitivity): 6 ng/L (ref <18)

## 2020-05-21 NOTE — ED Notes (Signed)
Pt denies shob 

## 2020-05-21 NOTE — ED Provider Notes (Signed)
[facility] EMERGENCY DEPT Provider Note   CSN: [id number] Arrival date & time: 05/21/20  1351     History Chief Complaint  Patient presents with  . Tachycardia    Cynthia Watkins is a 87 y.o. female.  Patient is a 87 year old female with a history of hypertension, hypercholesterolemia, right bundle branch block, palpitations who is on atenolol and coming in today due to having a rapid heartbeat at home.  Patient is here with her daughter who is visiting from out of town.  She reports that her mom reported feeling like her heart was beating abnormally.  Staff from [facility] came and checked her pulse and at that time it was 120.  Patient took an additional dose of her atenolol and by the time she arrived here her symptoms had resolved.  She states this has happened before but the staff and recommended that she come.  She denied having any chest pain, shortness of breath prior to the episode.  She has otherwise been feeling her normal self and has had no abdominal pain, cough, fever, nausea or vomiting.  She has been taking her medications as prescribed.  She has no complaints at this time.  The history is provided by the patient.       Past Medical History:  Diagnosis Date  . Acute upper respiratory infections of unspecified site 06/09/2011  . Breast cancer ([place]) 12/11/2008  . Fatigue 01/2012  . Hypercholesterolemia    on lipitor  . Hypertension   . Hyponatremia 01/2016  . Memory loss   . Mitral valve prolapse   . Other abnormal blood chemistry 01/21/2010  . Palpitations 08/05/2000  . Right bundle branch block   . Scoliosis   . Senile osteoporosis 09/28/2001    Patient Active Problem List   Diagnosis Date Noted  . Major neurocognitive disorder ([place]) 02/23/2019  . Bilateral hearing loss 02/23/2019  . Nocturia more than twice per night 02/23/2019  . Pain of left sacroiliac joint 02/23/2019  . Unsteady gait when walking 02/23/2019  . Bradycardia 08/27/2016  .  Orthostasis 02/01/2016  . Hyponatremia 01/23/2016  . Acute metabolic encephalopathy 73/53/2992  . Palpitations 07/02/2015  . Essential hypertension, benign 02/14/2015  . Lightheadedness 09/18/2014  . Right-sided low back pain with sciatica 02/14/2014  . Bilateral bunions 02/14/2014  . Varicose vein of leg 09/26/2013  . Edema 11/12/2012  . Senile osteoporosis 08/09/2012  . Hyperglycemia 08/09/2012  . Decreased exercise tolerance 01/29/2012  . Hypercholesterolemia   . Right bundle branch block     Past Surgical History:  Procedure Laterality Date  . BREAST LUMPECTOMY Right 12/27/2007  . LAPAROSCOPIC LYSIS OF ADHESIONS  07/15/2006   [name]  . OTHER SURGICAL HISTORY    . TONSILLECTOMY    . TUBAL LIGATION  1963     OB History   No obstetric history on file.     Family History  Problem Relation Age of Onset  . Stroke Mother   . Cancer Father        colon  . Alzheimer's disease Sister     Social History   Tobacco Use  . Smoking status: Former Smoker    Packs/day: 0.25    Years: 10.00    Pack years: 2.50    Quit date: 08/06/1954    Years since quitting: 65.8  . Smokeless tobacco: Never Used  Vaping Use  . Vaping Use: Never used  Substance Use Topics  . Alcohol use: Yes    Comment: Occasionally   .  Drug use: No    Home Medications Prior to Admission medications   Medication Sig Start Date End Date Taking? Authorizing Provider  atenolol (TENORMIN) 25 MG tablet Take 1/4 tablet daily Patient taking differently: Taking 6.25mg  Daily capsule formulary 11/25/18   [place], Peter M, MD  Cholecalciferol (VITAMIN D3) 1000 units CAPS Take 1,000 Units by mouth daily.     [provider]    Allergies    Patient has no known allergies.  Review of Systems   Review of Systems  All other systems reviewed and are negative.   Physical Exam Updated Vital Signs BP 112/64   Pulse (!) 58   Temp 98.5 F (36.9 C) (Oral)   Resp 15   Ht 5\' 3"  (1.6 m)   Wt 53.5 kg    SpO2 95%   BMI 20.90 kg/m   Physical Exam Vitals and nursing note reviewed.  Constitutional:      General: She is not in acute distress.    Appearance: Normal appearance. She is well-developed.  HENT:     Head: Normocephalic and atraumatic.  Eyes:     Pupils: Pupils are equal, round, and reactive to light.  Cardiovascular:     Rate and Rhythm: Normal rate and regular rhythm.     Heart sounds: Normal heart sounds. No murmur heard. No friction rub.  Pulmonary:     Effort: Pulmonary effort is normal.     Breath sounds: Normal breath sounds. No wheezing or rales.  Abdominal:     General: Bowel sounds are normal. There is no distension.     Palpations: Abdomen is soft.     Tenderness: There is no abdominal tenderness. There is no guarding or rebound.  Musculoskeletal:        General: No tenderness. Normal range of motion.     Right lower leg: No edema.     Left lower leg: No edema.     Comments: No edema  Skin:    General: Skin is warm and dry.     Findings: No rash.  Neurological:     Mental Status: She is alert and oriented to person, place, and time. Mental status is at baseline.     Cranial Nerves: No cranial nerve deficit.     Comments: Able to ambulate to the bathroom without difficulty  Psychiatric:        Mood and Affect: Mood normal.        Behavior: Behavior normal.      ED Results / Procedures / Treatments   Labs (all labs ordered are listed, but only abnormal results are displayed) Labs Reviewed  BASIC METABOLIC PANEL  CBC  TROPONIN I (HIGH SENSITIVITY)  TROPONIN I (HIGH SENSITIVITY)    EKG EKG Interpretation  Date/Time:  Monday May 21 2020 14:02:45 EDT Ventricular Rate:  75 PR Interval:  196 QRS Duration: 126 QT Interval:  382 QTC Calculation: 426 R Axis:   -27 Text Interpretation: Normal sinus rhythm Right bundle branch block Abnormal ECG Confirmed by Octaviano Glow [phone]) on 05/21/2020 2:45:34 PM   Radiology DG Chest 2 View  Result  Date: 05/21/2020 CLINICAL DATA:  Tachycardia EXAM: CHEST - 2 VIEW COMPARISON:  02/04/2016 FINDINGS: The heart size and mediastinal contours are within normal limits. Atherosclerotic calcification of the aortic knob. No focal airspace consolidation, pleural effusion, or pneumothorax. The visualized skeletal structures are unremarkable. IMPRESSION: No active cardiopulmonary disease. Electronically Signed   By: Davina Poke D.O.   On: 05/21/2020 14:46  Procedures Procedures   Medications Ordered in ED Medications - No data to display  ED Course  I have reviewed the triage vital signs and the nursing notes.  Pertinent labs & imaging results that were available during my care of the patient were reviewed by me and considered in my medical decision making (see chart for details).    MDM Rules/Calculators/A&P                          Patient presenting today due to a rapid heartbeat.  Patient does have a history of palpitations and is taking atenolol for this.  Unfortunately by the time patient arrived here her heart rate had returned to normal and EKG showed a sinus rhythm.  All of her lab work including CBC, BMP, troponin and chest x-ray are all within normal limits.  Patient is well-appearing on exam and has no complaints.  Feel it is reasonable that patient be discharged home.  Encouraged her to call Dr. Doug Sou office for follow-up and possible monitoring if she continues to have episodes.  MDM Number of Diagnoses or Management Options   Amount and/or Complexity of Data Reviewed Clinical lab tests: reviewed and ordered Tests in the radiology section of CPT: ordered and reviewed Tests in the medicine section of CPT: ordered and reviewed Decide to obtain previous medical records or to obtain history from someone other than the patient: yes Obtain history from someone other than the patient: no Review and summarize past medical records: yes Discuss the patient with other providers:  no Independent visualization of images, tracings, or specimens: yes  Risk of Complications, Morbidity, and/or Mortality Presenting problems: moderate Diagnostic procedures: minimal Management options: minimal  Patient Progress Patient progress: stable  Final Clinical Impression(s) / ED Diagnoses Final diagnoses:  Palpitations    Rx / DC Orders ED Discharge Orders    None       Blanchie Dessert, MD 05/21/20 1617

## 2020-05-21 NOTE — ED Triage Notes (Signed)
Pt arrives pov with daughter, reports "erratic pulse", "not beating regularly". Pt reports inability to explain if tachycardia, denies CP

## 2020-05-21 NOTE — Discharge Instructions (Signed)
Take your atenolol tomorrow morning as usual.  If you have another episode you can take an extra dose of the atenolol but if that does not improve your symptoms within 30 minutes you need to return to the emergency room.  If you develop any chest pain, shortness of breath or other concerns please return for evaluation.

## 2020-07-12 ENCOUNTER — Emergency Department (HOSPITAL_BASED_OUTPATIENT_CLINIC_OR_DEPARTMENT_OTHER)
Admission: EM | Admit: 2020-07-12 | Discharge: 2020-07-12 | Disposition: A | Discharge status: Home / Self Care | Payer: Medicare Other | Attending: Emergency Medicine | Admitting: Emergency Medicine

## 2020-07-12 ENCOUNTER — Other Ambulatory Visit: Payer: Self-pay

## 2020-07-12 ENCOUNTER — Emergency Department (HOSPITAL_BASED_OUTPATIENT_CLINIC_OR_DEPARTMENT_OTHER): Payer: Medicare Other

## 2020-07-12 ENCOUNTER — Encounter (HOSPITAL_BASED_OUTPATIENT_CLINIC_OR_DEPARTMENT_OTHER): Payer: Self-pay | Admitting: Obstetrics and Gynecology

## 2020-07-12 DIAGNOSIS — R42 Dizziness and giddiness: Secondary | ICD-10-CM

## 2020-07-12 DIAGNOSIS — Z79899 Other long term (current) drug therapy: Secondary | ICD-10-CM | POA: Insufficient documentation

## 2020-07-12 DIAGNOSIS — Z853 Personal history of malignant neoplasm of breast: Secondary | ICD-10-CM | POA: Insufficient documentation

## 2020-07-12 DIAGNOSIS — Z87891 Personal history of nicotine dependence: Secondary | ICD-10-CM | POA: Diagnosis not present

## 2020-07-12 DIAGNOSIS — H6123 Impacted cerumen, bilateral: Secondary | ICD-10-CM

## 2020-07-12 DIAGNOSIS — I10 Essential (primary) hypertension: Secondary | ICD-10-CM | POA: Diagnosis not present

## 2020-07-12 LAB — BASIC METABOLIC PANEL
Anion gap: 8 (ref 5–15)
BUN: 23 mg/dL (ref 8–23)
CO2: 26 mmol/L (ref 22–32)
Calcium: 9.7 mg/dL (ref 8.9–10.3)
Chloride: 100 mmol/L (ref 98–111)
Creatinine, Ser: 0.77 mg/dL (ref 0.44–1.00)
GFR, Estimated: >60 mL/min (ref >60)
Glucose, Bld: 103 mg/dL — ABNORMAL HIGH (ref 70–99)
Potassium: 3.9 mmol/L (ref 3.5–5.1)
Sodium: 134 mmol/L — ABNORMAL LOW (ref 135–145)

## 2020-07-12 LAB — CBC
HCT: 43 % (ref 36.0–46.0)
Hemoglobin: 14.6 g/dL (ref 12.0–15.0)
MCH: 33 pg (ref 26.0–34.0)
MCHC: 34 g/dL (ref 30.0–36.0)
MCV: 97.3 fL (ref 80.0–100.0)
Platelets: 285 10*3/uL (ref 150–400)
RBC: 4.42 MIL/uL (ref 3.87–5.11)
RDW: 13.7 % (ref 11.5–15.5)
WBC: 5.8 10*3/uL (ref 4.0–10.5)
nRBC: 0 % (ref 0.0–0.2)

## 2020-07-12 LAB — CBG MONITORING, ED: Glucose-Capillary: 105 mg/dL — ABNORMAL HIGH (ref 70–99)

## 2020-07-12 MED ORDER — CARBAMIDE PEROXIDE 6.5 % OT SOLN: 5.0000 [drp] | Freq: Once | OTIC | Status: DC

## 2020-07-12 MED ORDER — DOCUSATE SODIUM 50 MG/5ML PO LIQD
100.0000 mg | Freq: Once | ORAL | Status: AC
  Filled 2020-07-12: qty 10

## 2020-07-12 NOTE — ED Triage Notes (Signed)
Patient reports she woke up this morning feeling dizzy. Patient able to ambulate without difficulty. Patient denies pain or nausea. Patient denies recent falls. Quick neuro exam unremarkable at this time.

## 2020-07-12 NOTE — ED Provider Notes (Signed)
[facility] EMERGENCY DEPT Provider Note   CSN: [id number] Arrival date & time: 07/12/20  1033     History Chief Complaint  Patient presents with   Dizziness    Cynthia Watkins is a 87 y.o. female.  HPI Patient reports that she had an episode of dizziness this morning.  She reports the symptoms were occurring all night long.  Patient does not recall exactly what time she went to bed but is sure she went to bed around 9 because she always does.  She reports she slept poorly due to feeling dizzy.  She reports she probably got up multiple times during the night and went to the bathroom but did not notice that she was significantly off balance or had to hold onto things around her to go back and forth.  She denies she had any falls or injuries.  She reports this morning when she got up she continued to feel dizzy.  She has a hard time qualifying the characteristics.  She cannot commit to spinning versus lightheadedness.  No associated chest pain palpitations or shortness of breath.  No headache.  Patient denies any visual changes.  No focal weakness numbness or tingling.  She reports she was able to prepare her breakfast as usual.  Patient is also been able to work on her crossword this morning.  No difficulty seeing the crossword grid or concentrating.  Patient's son called her around 7:30 AM and at that time she reported being symptomatic.  At the time of my evaluation, patient is not endorsing feeling dizzy at rest.    Past Medical History:  Diagnosis Date   Acute upper respiratory infections of unspecified site 06/09/2011   Breast cancer ([place]) 12/11/2008   Fatigue 01/2012   Hypercholesterolemia    on lipitor   Hypertension    Hyponatremia 01/2016   Memory loss    Mitral valve prolapse    Other abnormal blood chemistry 01/21/2010   Palpitations 08/05/2000   Right bundle branch block    Scoliosis    Senile osteoporosis 09/28/2001    Patient Active Problem List   Diagnosis  Date Noted   Major neurocognitive disorder ([place]) 02/23/2019   Bilateral hearing loss 02/23/2019   Nocturia more than twice per night 02/23/2019   Pain of left sacroiliac joint 02/23/2019   Unsteady gait when walking 02/23/2019   Bradycardia 08/27/2016   Orthostasis 02/01/2016   Hyponatremia 40/98/1191   Acute metabolic encephalopathy 47/82/9562   Palpitations 07/02/2015   Essential hypertension, benign 02/14/2015   Lightheadedness 09/18/2014   Right-sided low back pain with sciatica 02/14/2014   Bilateral bunions 02/14/2014   Varicose vein of leg 09/26/2013   Edema 11/12/2012   Senile osteoporosis 08/09/2012   Hyperglycemia 08/09/2012   Decreased exercise tolerance 01/29/2012   Hypercholesterolemia    Right bundle branch block     Past Surgical History:  Procedure Laterality Date   BREAST LUMPECTOMY Right 12/27/2007   LAPAROSCOPIC LYSIS OF ADHESIONS  07/15/2006   [name]   OTHER SURGICAL HISTORY     TONSILLECTOMY     TUBAL LIGATION  1963     OB History     Gravida      Para      Term      Preterm      AB      Living  3      SAB      IAB      Ectopic      Multiple  Live Births              Family History  Problem Relation Age of Onset   Stroke Mother    Cancer Father        colon   Alzheimer's disease Sister     Social History   Tobacco Use   Smoking status: Former    Packs/day: 0.25    Years: 10.00    Pack years: 2.50    Types: Cigarettes    Quit date: 08/06/1954    Years since quitting: 65.9   Smokeless tobacco: Never  Vaping Use   Vaping Use: Never used  Substance Use Topics   Alcohol use: Yes    Comment: Occasionally    Drug use: No    Home Medications Prior to Admission medications   Medication Sig Start Date End Date Taking? Authorizing Provider  atenolol (TENORMIN) 25 MG tablet Take 1/4 tablet daily Patient taking differently: Taking 6.25mg  Daily capsule formulary 11/25/18  Yes [place], Peter M, MD   Cholecalciferol (VITAMIN D3) 1000 units CAPS Take 1,000 Units by mouth daily.    Yes [provider]    Allergies    Patient has no known allergies.  Review of Systems   Review of Systems 10 systems reviewed and negative except as per HPI Physical Exam Updated Vital Signs BP (!) 142/69   Pulse (!) 51   Temp 98.3 F (36.8 C) (Oral)   Resp 14   Ht 5\' 3"  (1.6 m)   Wt 53.8 kg   SpO2 97%   BMI 21.01 kg/m   Physical Exam Constitutional:      Comments: Alert nontoxic clinically well in appearance  HENT:     Head: Normocephalic and atraumatic.     Ears:     Comments: Bilateral cerumen impaction.  Once cerumen cleared bilateral TMs normal.    Nose: Nose normal.     Mouth/Throat:     Mouth: Mucous membranes are moist.     Pharynx: Oropharynx is clear.  Eyes:     Extraocular Movements: Extraocular movements intact.     Conjunctiva/sclera: Conjunctivae normal.     Pupils: Pupils are equal, round, and reactive to light.  Cardiovascular:     Rate and Rhythm: Normal rate and regular rhythm.  Pulmonary:     Effort: Pulmonary effort is normal.     Breath sounds: Normal breath sounds.  Abdominal:     General: There is no distension.     Palpations: Abdomen is soft.     Tenderness: There is no abdominal tenderness. There is no guarding.  Musculoskeletal:        General: No swelling or tenderness. Normal range of motion.     Cervical back: Neck supple.     Right lower leg: No edema.     Left lower leg: No edema.  Skin:    General: Skin is warm and dry.  Neurological:     General: No focal deficit present.     Mental Status: She is oriented to person, place, and time.     Cranial Nerves: No cranial nerve deficit.     Motor: No weakness.     Coordination: Coordination normal.     Comments: No aphasia.  Normal cognitive function.  Follows commands without difficulty.  Beach clear.  Cranial nerves intact.  Finger-nose exam normal bilaterally.  Motor strength 5\5 upper  and lower extremities.  Sensation intact light touch x4 extremities.  Psychiatric:  Mood and Affect: Mood normal.    ED Results / Procedures / Treatments   Labs (all labs ordered are listed, but only abnormal results are displayed) Labs Reviewed  BASIC METABOLIC PANEL - Abnormal; Notable for the following components:      Result Value   Sodium 134 (*)    Glucose, Bld 103 (*)    All other components within normal limits  CBG MONITORING, ED - Abnormal; Notable for the following components:   Glucose-Capillary 105 (*)    All other components within normal limits  CBC  URINALYSIS, ROUTINE W REFLEX MICROSCOPIC    EKG EKG Interpretation  Date/Time:  Thursday July 12 2020 10:47:32 EDT Ventricular Rate:  54 PR Interval:  203 QRS Duration: 124 QT Interval:  408 QTC Calculation: 387 R Axis:   56 Text Interpretation: Sinus rhythm Right bundle branch block Borderline ST elevation, inferior leads old RBBB, no sig change Confirmed by Charlesetta Shanks [phone]) on 07/12/2020 2:32:18 PM  Radiology CT Head Wo Contrast  Result Date: 07/12/2020 CLINICAL DATA:  Dizziness EXAM: CT HEAD WITHOUT CONTRAST TECHNIQUE: Contiguous axial images were obtained from the base of the skull through the vertex without intravenous contrast. COMPARISON:  03/22/2019 FINDINGS: Brain: No evidence of acute infarction, hemorrhage, hydrocephalus, extra-axial collection or mass lesion/mass effect. Mild low-density changes within the periventricular and subcortical white matter compatible with chronic microvascular ischemic change. Mild diffuse cerebral volume loss. Vascular: Atherosclerotic calcifications involving the large vessels of the skull base. No unexpected hyperdense vessel. Skull: Normal. Negative for fracture or focal lesion. Sinuses/Orbits: No acute finding. Other: None. IMPRESSION: 1. No acute intracranial findings. 2. Chronic microvascular ischemic change and cerebral volume loss. Electronically Signed   By:  Davina Poke D.O.   On: 07/12/2020 12:47    Procedures .Ear Cerumen Removal  Date/Time: 07/12/2020 2:35 PM Performed by: Charlesetta Shanks, MD Authorized by: Charlesetta Shanks, MD   Consent:    Consent obtained:  Verbal   Consent given by:  Patient Universal protocol:    Patient identity confirmed:  Verbally with patient Procedure details:    Location:  L ear and R ear   Procedure type: irrigation     Procedure outcomes: cerumen removed   Post-procedure details:    Inspection:  TM intact and ear canal clear   Hearing quality:  Normal   Procedure completion:  Tolerated well, no immediate complications   Medications Ordered in ED Medications  docusate (COLACE) 50 MG/5ML liquid 100 mg ( Oral Given 07/12/20 1223)    ED Course  I have reviewed the triage vital signs and the nursing notes.  Pertinent labs & imaging results that were available during my care of the patient were reviewed by me and considered in my medical decision making (see chart for details).    MDM Rules/Calculators/A&P                          Patient has experienced a dizzy spell of nonspecific quality.  It was difficult to discern vertiginous quality versus lightheadedness.  Patient does not endorse symptoms of any neurologic deficit.  She has been able to perform all of her ADLs today at baseline.  Also, no balance impairment.  Patient was ambulated at baseline gait steady.  She also does not endorse any symptoms suggestive of cardiovascular etiology.  Blood pressures and heart rate are normal without any EKG changes from previous.  Patient is clinically well in appearance.  CT does not show  any evidence of any acute infarcts.  Basic chemistries are within normal limits patient has no anemia.  At this time plan will be to return to [facility] with close observation.  I recommend follow-up with neurology and ENT.  Strict return precautions reviewed with the patient.  She also had bilateral cerumen impaction these are  cleared. Final Clinical Impression(s) / ED Diagnoses Final diagnoses:  Dizzy spells  Bilateral impacted cerumen    Rx / DC Orders ED Discharge Orders          Ordered    Ambulatory referral to Neurology       Comments: An appointment is requested in approximately: 1 week   07/12/20 1422             Charlesetta Shanks, MD 07/12/20 1436

## 2020-07-12 NOTE — ED Notes (Signed)
Patient transported to CT 

## 2020-07-12 NOTE — ED Notes (Signed)
Patient able to ambulate in hallway without difficulty.  Denies dizziness

## 2020-07-12 NOTE — Discharge Instructions (Addendum)
You should have a follow-up with the ear nose throat specialist and neurologist regarding several recurrent episodes of dizziness.  At this time, I suspect your dizzy episodes are due to peripheral vertigo.  However, there are many causes of dizziness and further evaluation may help identify the cause of your episodes. Your ears were irrigated.  There was wax buildup.  You may use home kits for earwax. Return to the emergency department immediately if you find you have trouble with your balance, changes or difficulty with your vision, weakness numbness or tingling of an arm or leg or other concerning symptoms.  Do not delay returning if you develop any of the symptoms.  Treating a stroke early is much more effective than managing a stroke later.

## 2020-07-12 NOTE — ED Notes (Signed)
Given peanut butter crackers and drink

## 2020-07-12 NOTE — ED Notes (Signed)
Pt's CBG result was 105. Informed Zenia Resides - RN.

## 2020-08-28 ENCOUNTER — Encounter: Payer: Self-pay | Admitting: Internal Medicine

## 2020-08-28 LAB — BASIC METABOLIC PANEL
BUN: 14 (ref 4–21)
CO2: 25 — AB (ref 13–22)
Chloride: 99 (ref 99–108)
Creatinine: 0.8 (ref 0.5–1.1)
Glucose: 97
Potassium: 4.4 (ref 3.4–5.3)
Sodium: 137 (ref 137–147)

## 2020-08-28 LAB — LIPID PANEL
Cholesterol: 369 — AB (ref 0–200)
HDL: 51 (ref 35–70)
LDL Cholesterol: 284
LDl/HDL Ratio: 7.2
Triglycerides: 170 — AB (ref 40–160)

## 2020-08-28 LAB — CBC AND DIFFERENTIAL
HCT: 43 (ref 36–46)
Hemoglobin: 15 (ref 12.0–16.0)
Platelets: 261 (ref 150–399)
WBC: 6.1

## 2020-08-28 LAB — CBC: RBC: 4.41 (ref 3.87–5.11)

## 2020-08-28 LAB — COMPREHENSIVE METABOLIC PANEL: Calcium: 9.2 (ref 8.7–10.7)

## 2020-08-29 ENCOUNTER — Encounter: Payer: Self-pay | Admitting: Internal Medicine

## 2020-08-29 ENCOUNTER — Non-Acute Institutional Stay: Payer: Medicare Other | Admitting: Internal Medicine

## 2020-08-29 ENCOUNTER — Other Ambulatory Visit: Payer: Self-pay

## 2020-08-29 VITALS — BP 122/60 | HR 70 | Temp 97.5°F | Resp 16 | Ht 63.0 in | Wt 117.2 lb

## 2020-08-29 DIAGNOSIS — E782 Mixed hyperlipidemia: Secondary | ICD-10-CM

## 2020-08-29 DIAGNOSIS — R413 Other amnesia: Secondary | ICD-10-CM

## 2020-08-29 DIAGNOSIS — E871 Hypo-osmolality and hyponatremia: Secondary | ICD-10-CM

## 2020-08-29 DIAGNOSIS — I10 Essential (primary) hypertension: Secondary | ICD-10-CM

## 2020-08-29 DIAGNOSIS — M810 Age-related osteoporosis without current pathological fracture: Secondary | ICD-10-CM

## 2020-08-29 DIAGNOSIS — R002 Palpitations: Secondary | ICD-10-CM

## 2020-08-29 NOTE — Progress Notes (Signed)
Location:  [facility] of Service:  Clinic (12)  Provider:   Code Status: DNR Goals of Care:  Advanced Directives 08/29/2020  Does Patient Have a Medical Advance Directive? Yes  Type of Advance Directive [place]  Does patient want to make changes to medical advance directive? No - Patient declined  Copy of [place] in Chart? Yes - validated most recent copy scanned in chart (See row information)  Would patient like information on creating a medical advance directive? -  Pre-existing out of facility DNR order (yellow form or pink MOST form) -     Chief Complaint  Patient presents with   Medical Management of Chronic Issues    1 month follow up    Health Maintenance    Discuss need for td/tdap vaccine and updated influenza vaccine    HPI: Patient is a 87 y.o. female seen today for medical management of chronic diseases.    Patient lives in [place]  Has h/o Hyponatremia, Palpitations, Osteoporosis, Hyperlipidemia, MCI  Did not have any acute complains Has one visit 2 visits to ED.  One for Palpitations which responded to extra dose of Toprol and One for dizziness which she say is better . They cleaned her Ears in ED Her CT Scan showed Chronic Ischemic changes with no Acute changes  She seems to be managing well. Staying independent in her ADLS. NO falls. Weight is stable  Past Medical History:  Diagnosis Date   Acute upper respiratory infections of unspecified site 06/09/2011   Breast cancer ([place]) 12/11/2008   Fatigue 01/2012   Hypercholesterolemia    on lipitor   Hypertension    Hyponatremia 01/2016   Memory loss    Mitral valve prolapse    Other abnormal blood chemistry 01/21/2010   Palpitations 08/05/2000   Right bundle branch block    Scoliosis    Senile osteoporosis 09/28/2001    Past Surgical History:  Procedure Laterality Date   BREAST LUMPECTOMY Right 12/27/2007   LAPAROSCOPIC  LYSIS OF ADHESIONS  07/15/2006   [name]   OTHER SURGICAL HISTORY     TONSILLECTOMY     TUBAL LIGATION  1963    No Known Allergies  Outpatient Encounter Medications as of 08/29/2020  Medication Sig   atenolol (TENORMIN) 25 MG tablet Take 1/4 tablet daily (Patient taking differently: Taking 6.'25mg'$  Daily capsule formulary)   Cholecalciferol (VITAMIN D3) 1000 units CAPS Take 1,000 Units by mouth daily.    No facility-administered encounter medications on file as of 08/29/2020.    Review of Systems:  Review of Systems Review of Systems  Constitutional: Negative for activity change, appetite change, chills, diaphoresis, fatigue and fever.  HENT: Negative for mouth sores, postnasal drip, rhinorrhea, sinus pain and sore throat.   Respiratory: Negative for apnea, cough, chest tightness, shortness of breath and wheezing.   Cardiovascular: Negative for chest pain, palpitations and leg swelling.  Gastrointestinal: Negative for abdominal distention, abdominal pain, constipation, diarrhea, nausea and vomiting.  Genitourinary: Negative for dysuria and frequency.  Musculoskeletal: Negative for arthralgias, joint swelling and myalgias.  Skin: Negative for rash.  Neurological: Negative for dizziness, syncope, weakness, light-headedness and numbness.  Psychiatric/Behavioral: Negative for behavioral problems, confusion and sleep disturbance.    Health Maintenance  Topic Date Due   TETANUS/TDAP  Never done   COVID-19 Vaccine (4 - Booster for Moderna series) 02/22/2020   INFLUENZA VACCINE  08/06/2020   DEXA SCAN  Completed  PNA vac Low Risk Adult  Completed   Zoster Vaccines- Shingrix  Completed   HPV VACCINES  Aged Out    Physical Exam: Vitals:   08/29/20 1026  BP: 122/60  Pulse: 70  Resp: 16  Temp: (!) 97.5 F (36.4 C)  SpO2: 95%  Weight: 117 lb 3.2 oz (53.2 kg)  Height: '5\' 3"'$  (1.6 m)   Body mass index is 20.76 kg/m. Physical Exam Constitutional: Oriented to person, place, and  time. Well-developed and well-nourished.  HENT:  Head: Normocephalic.  Mouth/Throat: Oropharynx is clear and moist.  Eyes: Pupils are equal, round, and reactive to light.  Neck: Neck supple.  Ears TM normal Mild wax in both ears Cardiovascular: Normal rate and normal heart sounds.  No murmur heard. Pulmonary/Chest: Effort normal and breath sounds normal. No respiratory distress. No wheezes. She has no rales.  Abdominal: Soft. Bowel sounds are normal. No distension. There is no tenderness. There is no rebound.  Musculoskeletal: No edema.  Lymphadenopathy: none Neurological: Alert and oriented to person, place, and time.  Skin: Skin is warm and dry.  Psychiatric: Normal mood and affect. Behavior is normal. Thought content normal.   MMSE - Mini Mental State Exam 02/23/2019 09/15/2017 08/06/2016  Orientation to time '4 4 5  '$ Orientation to Place '4 5 5  '$ Registration '3 3 3  '$ Attention/ Calculation '5 5 5  '$ Recall '3 3 3  '$ Language- name 2 objects '2 2 2  '$ Language- repeat '1 1 1  '$ Language- follow 3 step command '3 3 3  '$ Language- read & follow direction '1 1 1  '$ Write a sentence '1 1 1  '$ Copy design '1 1 1  '$ Total score '28 29 30    '$ Labs reviewed: Basic Metabolic Panel: Recent Labs    04/19/20 0000 05/21/20 1444 07/12/20 1045  NA 137 135 134*  K 4.5 4.1 3.9  CL 100 102 100  CO2 27* 24 26  GLUCOSE  --  97 103*  BUN '16 22 23  '$ CREATININE 0.8 0.75 0.77  CALCIUM 9.8 9.0 9.7   Liver Function Tests: No results for input(s): AST, ALT, ALKPHOS, BILITOT, PROT, ALBUMIN in the last 8760 hours. No results for input(s): LIPASE, AMYLASE in the last 8760 hours. No results for input(s): AMMONIA in the last 8760 hours. CBC: Recent Labs    05/21/20 1444 07/12/20 1045  WBC 7.3 5.8  HGB 14.0 14.6  HCT 42.0 43.0  MCV 99.3 97.3  PLT 283 285   Lipid Panel: No results for input(s): CHOL, HDL, LDLCALC, TRIG, CHOLHDL, LDLDIRECT in the last 8760 hours. Lab Results  Component Value Date   HGBA1C 5.9 (H)  01/23/2016    Procedures since last visit: No results found.  Assessment/Plan Hyponatremia Last Sodium 137 Will repeat BMP in 3 months  Memory loss Last MMSE in 2/21 was 28/30 Doing well with no issues with her ADLS IADLS performed with help of her family Will consider repeating it in Next visit  Senile osteoporosis Has order for DEXA Not sure if she will agree for treatment. Had refused to Dr Mariea Clonts in the past Last TSCORe was -2.5   Palpitations On Toprol Very Low dose and seems to be helping Sees Cardiology Annually Had one visit to ED in 5/22 for Palpitations and extra Toprol helped her go back to normal [place]  HLD Her LDL is 284 Not sure if at her age statin will help for Primary prevention. Has refused Statin before to Dr Mariea Clonts Will not pursue or do  diet modification as she is already very small    Labs/tests ordered:  * No order type specified * Next appt:  11/20/2020

## 2020-08-30 ENCOUNTER — Encounter: Payer: Self-pay | Admitting: Adult Health

## 2020-08-30 NOTE — Progress Notes (Signed)
Letter request from [name]'s POA Cynthia Watkins for exemption from [employer]. Request granted.

## 2020-10-24 ENCOUNTER — Other Ambulatory Visit (HOSPITAL_BASED_OUTPATIENT_CLINIC_OR_DEPARTMENT_OTHER): Payer: Self-pay

## 2020-10-24 ENCOUNTER — Encounter: Payer: Self-pay | Admitting: Hematology and Oncology

## 2020-10-24 MED ORDER — INFLUENZA VAC A&B SA ADJ QUAD 0.5 ML IM PRSY
PREFILLED_SYRINGE | INTRAMUSCULAR | 0 refills | Status: DC
  Filled 2020-10-24: qty 0.5, 1d supply, fill #0

## 2020-11-15 LAB — COMPREHENSIVE METABOLIC PANEL
Albumin: 4.1 (ref 3.5–5.0)
Calcium: 9.5 (ref 8.7–10.7)
GFR calc Af Amer: 70.97
GFR calc non Af Amer: 61.23
Globulin: 2.3

## 2020-11-15 LAB — VITAMIN D 25 HYDROXY (VIT D DEFICIENCY, FRACTURES): Vit D, 25-Hydroxy: 37.5

## 2020-11-16 LAB — BASIC METABOLIC PANEL
BUN: 15 (ref 4–21)
CO2: 24 — AB (ref 13–22)
Chloride: 99 (ref 99–108)
Creatinine: 0.8 (ref 0.5–1.1)
Glucose: 101
Potassium: 4.7 (ref 3.4–5.3)
Sodium: 137 (ref 137–147)

## 2020-11-16 LAB — HEPATIC FUNCTION PANEL
ALT: 9 (ref 7–35)
AST: 15 (ref 13–35)
Alkaline Phosphatase: 81 (ref 25–125)
Bilirubin, Total: 0.4

## 2020-11-20 ENCOUNTER — Telehealth: Payer: Self-pay

## 2020-11-20 ENCOUNTER — Encounter: Payer: Self-pay | Admitting: Nurse Practitioner

## 2020-11-20 ENCOUNTER — Other Ambulatory Visit: Payer: Self-pay

## 2020-11-20 ENCOUNTER — Ambulatory Visit (INDEPENDENT_AMBULATORY_CARE_PROVIDER_SITE_OTHER): Payer: Medicare Other | Admitting: Nurse Practitioner

## 2020-11-20 DIAGNOSIS — Z0000 Encounter for general adult medical examination without abnormal findings: Secondary | ICD-10-CM | POA: Diagnosis not present

## 2020-11-20 NOTE — Progress Notes (Signed)
This service is provided via telemedicine  No vital signs collected/recorded due to the encounter was a telemedicine visit.   Location of patient (ex: home, work):  Home  Patient consents to a telephone visit:  yes, see encounter dated 11/20/2020  Location of the provider (ex: office, home):  [place]  Name of any referring provider:  Royal Hawthorn, N{P  Names of all persons participating in the telemedicine service and their role in the encounter:  Sherrie Mustache, Nurse Practitioner, Carroll Kinds, CMA, and patient.   Time spent on call:  9 minutes with medical assistant

## 2020-11-20 NOTE — Progress Notes (Signed)
Subjective:   Cynthia Watkins is a 87 y.o. female who presents for Medicare Annual (Subsequent) preventive examination.  Review of Systems     Cardiac Risk Factors include: advanced age (>70men, >73 women);hypertension;dyslipidemia     Objective:    There were no vitals filed for this visit. There is no height or weight on file to calculate BMI.  Advanced Directives 11/20/2020 08/29/2020 07/12/2020 05/21/2020 01/25/2020 11/15/2019 10/26/2019  Does Patient Have a Medical Advance Directive? No Yes No Yes Yes No Yes  Type of Advance Directive - Healthcare Power of [place] of facility DNR (pink MOST or yellow form)  Does patient want to make changes to medical advance directive? - No - Patient declined - - No - Patient declined No - Patient declined No - Patient declined  Copy of [place] in Chart? - Yes - validated most recent copy scanned in chart (See row information) - - Yes - validated most recent copy scanned in chart (See row information) - -  Would patient like information on creating a medical advance directive? - - No - Patient declined - - - -  Pre-existing out of facility DNR order (yellow form or pink MOST form) - - - - - - -    Current Medications (verified) Outpatient Encounter Medications as of 11/20/2020  Medication Sig   atenolol (TENORMIN) 25 MG tablet Take 1/4 tablet daily (Patient taking differently: Taking 6.25mg  Daily capsule formulary)   Cholecalciferol (VITAMIN D3) 1000 units CAPS Take 1,000 Units by mouth daily.    [DISCONTINUED] influenza vaccine adjuvanted (FLUAD) 0.5 ML injection Inject into the muscle.   No facility-administered encounter medications on file as of 11/20/2020.    Allergies (verified) Patient has no known allergies.   History: Past Medical History:  Diagnosis Date   Acute upper respiratory infections of unspecified site 06/09/2011   Breast cancer ([place]) 12/11/2008   Fatigue 01/2012    Hypercholesterolemia    on lipitor   Hypertension    Hyponatremia 01/2016   Memory loss    Mitral valve prolapse    Other abnormal blood chemistry 01/21/2010   Palpitations 08/05/2000   Right bundle branch block    Scoliosis    Senile osteoporosis 09/28/2001   Past Surgical History:  Procedure Laterality Date   BREAST LUMPECTOMY Right 12/27/2007   LAPAROSCOPIC LYSIS OF ADHESIONS  07/15/2006   [name]   OTHER SURGICAL HISTORY     TONSILLECTOMY     TUBAL LIGATION  1963   Family History  Problem Relation Age of Onset   Stroke Mother    Cancer Father        colon   Alzheimer's disease Sister    Social History   Socioeconomic History   Marital status: Widowed    Spouse name: Not on file   Number of children: 3   Years of education: Not on file   Highest education level: Not on file  Occupational History   Occupation: Retired Product manager: RETIRED  Tobacco Use   Smoking status: Former    Packs/day: 0.25    Years: 10.00    Pack years: 2.50    Types: Cigarettes    Quit date: 08/06/1954    Years since quitting: 66.3   Smokeless tobacco: Never  Vaping Use   Vaping Use: Never used  Substance and Sexual Activity   Alcohol use: Yes    Comment: Occasionally  Drug use: No   Sexual activity: Never  Other Topics Concern   Not on file  Social History Narrative   Lives at [facility] since 05/1998   Widowed   Living will   Former smoker, stopped 1956   Alcohol  Rare   Exercise - walk one hour daily 7 days a week,  3 days of stretching and strengthen, yoga 45 minutes 2 days a week   Social Determinants of Radio broadcast assistant Strain: Not on file  Food Insecurity: Not on file  Transportation Needs: Not on file  Physical Activity: Not on file  Stress: Not on file  Social Connections: Not on file    Tobacco Counseling Counseling given: Not Answered   Clinical Intake:  Pre-visit preparation completed: Yes  Pain : No/denies pain     BMI -  recorded: 20 Diabetes: No  How often do you need to have someone help you when you read instructions, pamphlets, or other written materials from your doctor or pharmacy?: 1 - Never  Diabetic?no         Activities of Daily Living In your present state of health, do you have any difficulty performing the following activities: 11/20/2020  Hearing? Y  Vision? N  Difficulty concentrating or making decisions? Y  Walking or climbing stairs? N  Dressing or bathing? N  Doing errands, shopping? Y  Preparing Food and eating ? N  Using the Toilet? N  In the past six months, have you accidently leaked urine? N  Do you have problems with loss of bowel control? N  Managing your Medications? Y  Comment son manages  Managing your Finances? Y  Housekeeping or managing your Housekeeping? N  Some recent data might be hidden    Patient Care Team: Royal Hawthorn, NP as PCP - General (Nurse Practitioner) Martinique, Peter M, MD as PCP - Cardiology (Cardiology) Prentiss Bells, MD as Consulting Physician (Ophthalmology) Martin[place], Peter M, MD as Consulting Physician (Cardiology) Allyn Kenner, MD as Consulting Physician (Dermatology) Michael Boston, MD as Consulting Physician (General Surgery) Community, Well Loreli Slot, MD as Consulting Physician (Hematology and Oncology) Magrinat, Virgie Dad, MD as Consulting Physician (Oncology) [name], MD as Consulting Physician (Ophthalmology)  Indicate any recent Medical Services you may have received from other than [facility] providers in the past year (date may be approximate).     Assessment:   This is a routine wellness examination for Gusta.  Hearing/Vision screen Hearing Screening - Comments:: Patient has some hearing problems. She has hearing aids but they aren't working correctly. Vision Screening - Comments:: Patient wears glasses. Patient unsure if eye exam was done in past year or not.  Dietary issues and exercise  activities discussed: Current Exercise Habits: Structured exercise class, Type of exercise: calisthenics, Time (Minutes): 30, Frequency (Times/Week): 3, Weekly Exercise (Minutes/Week): 90   Goals Addressed   None    Depression Screen PHQ 2/9 Scores 11/20/2020 01/25/2020 11/15/2019 10/26/2019 02/23/2019 01/19/2019 11/11/2018  PHQ - 2 Score 0 0 0 0 0 0 0    Fall Risk Fall Risk  11/20/2020 08/29/2020 04/30/2020 01/25/2020 11/15/2019  Falls in the past year? 0 0 0 0 0  Number falls in past yr: 0 0 0 0 0  Injury with Fall? 0 0 - 0 0  Comment - - - - -  Risk for fall due to : No Fall Risks No Fall Risks - - -  Follow up Falls evaluation completed Falls evaluation completed - - -  FALL RISK PREVENTION PERTAINING TO THE HOME:  Any stairs in or around the home? No  If so, are there any without handrails? No  Home free of loose throw rugs in walkways, pet beds, electrical cords, etc? Yes  Adequate lighting in your home to reduce risk of falls? Yes   ASSISTIVE DEVICES UTILIZED TO PREVENT FALLS:  Life alert? Yes  Use of a cane, walker or w/c? No  Grab bars in the bathroom? Yes  Shower chair or bench in shower? Yes  Elevated toilet seat or a handicapped toilet? No   TIMED UP AND GO:  Was the test performed? No .    Cognitive Function: MMSE - Mini Mental State Exam 02/23/2019 09/15/2017 08/06/2016 08/15/2015 02/14/2014  Orientation to time 4 4 5 4 5   Orientation to Place 4 5 5 5 5   Registration 3 3 3 3 3   Attention/ Calculation 5 5 5 5 5   Recall 3 3 3 3 3   Language- name 2 objects 2 2 2 2 2   Language- repeat 1 1 1 1 1   Language- follow 3 step command 3 3 3 3 3   Language- read & follow direction 1 1 1 1 1   Write a sentence 1 1 1 1 1   Copy design 1 1 1 1 1   Total score 28 29 30 29 30      6CIT Screen 11/20/2020 11/15/2019 11/11/2018  What Year? 0 points 0 points 0 points  What month? 0 points 0 points 0 points  What time? 0 points 0 points 0 points  Count back from 20 0 points 0 points 0  points  Months in reverse 2 points 0 points 0 points  Repeat phrase 2 points 2 points 0 points  Total Score 4 2 0    Immunizations Immunization History  Administered Date(s) Administered   Influenza Split 10/23/2011   Influenza, High Dose Seasonal PF 09/20/2015, 10/09/2016, 10/15/2018   Influenza,inj,Quad PF,6+ Mos 10/28/2013, 10/30/2017   Influenza-Unspecified 10/06/2012, 10/26/2014   Moderna Sars-Covid-2 Vaccination 01/16/2019, 02/15/2019, 11/22/2019   Pneumococcal Conjugate-13 02/14/2014   Pneumococcal Polysaccharide-23 01/06/1997   Zoster Recombinat (Shingrix) 10/09/2016, 09/17/2017, 01/14/2018   Zoster, Live 01/07/1999    TDAP status: Due, Education has been provided regarding the importance of this vaccine. Advised may receive this vaccine at local pharmacy or Health Dept. Aware to provide a copy of the vaccination record if obtained from local pharmacy or Health Dept. Verbalized acceptance and understanding.  Flu Vaccine status: Due, Education has been provided regarding the importance of this vaccine. Advised may receive this vaccine at local pharmacy or Health Dept. Aware to provide a copy of the vaccination record if obtained from local pharmacy or Health Dept. Verbalized acceptance and understanding.  Pneumococcal vaccine status: Up to date  Covid-19 vaccine status: Information provided on how to obtain vaccines.   Qualifies for Shingles Vaccine? Yes   Zostavax completed Yes   Shingrix Completed?: Yes  Screening Tests Health Maintenance  Topic Date Due   TETANUS/TDAP  Never done   COVID-19 Vaccine (4 - Booster for Moderna series) 01/17/2020   INFLUENZA VACCINE  08/06/2020   Pneumonia Vaccine 2+ Years old  Completed   DEXA SCAN  Completed   Zoster Vaccines- Shingrix  Completed   HPV VACCINES  Aged Out    Health Maintenance  Health Maintenance Due  Topic Date Due   TETANUS/TDAP  Never done   COVID-19 Vaccine (4 - Booster for Moderna series) 01/17/2020    INFLUENZA VACCINE  08/06/2020  Colorectal cancer screening: No longer required.   Mammogram status: No longer required due to aged out.    Lung Cancer Screening: (Low Dose CT Chest recommended if Age 27-80 years, 30 pack-year currently smoking OR have quit w/in 15years.) does not qualify.   Lung Cancer Screening Referral: na  Additional Screening:  Hepatitis C Screening: does not qualify; Completed   Vision Screening: Recommended annual ophthalmology exams for early detection of glaucoma and other disorders of the eye. Is the patient up to date with their annual eye exam?  Yes  Who is the provider or what is the name of the office in which the patient attends annual eye exams? Unsure of name If pt is not established with a provider, would they like to be referred to a provider to establish care? No .   Dental Screening: Recommended annual dental exams for proper oral hygiene  Community Resource Referral / Chronic Care Management: CRR required this visit?  No   CCM required this visit?  No      Plan:     I have personally reviewed and noted the following in the patient's chart:   Medical and social history Use of alcohol, tobacco or illicit drugs  Current medications and supplements including opioid prescriptions.  Functional ability and status Nutritional status Physical activity Advanced directives List of other physicians Hospitalizations, surgeries, and ER visits in previous 12 months Vitals Screenings to include cognitive, depression, and falls Referrals and appointments  In addition, I have reviewed and discussed with patient certain preventive protocols, quality metrics, and best practice recommendations. A written personalized care plan for preventive services as well as general preventive health recommendations were provided to patient.     Lauree Chandler, NP   11/20/2020   Virtual Visit via Telephone Note  I connected withNAME@ on 11/20/20 at  11:30 AM EST by telephone and verified that I am speaking with the correct person using two identifiers.  Location: Patient: home Provider: [facility].    I discussed the limitations, risks, security and privacy concerns of performing an evaluation and management service by telephone and the availability of in person appointments. I also discussed with the patient that there may be a patient responsible charge related to this service. The patient expressed understanding and agreed to proceed.   I discussed the assessment and treatment plan with the patient. The patient was provided an opportunity to ask questions and all were answered. The patient agreed with the plan and demonstrated an understanding of the instructions.   The patient was advised to call back or seek an in-person evaluation if the symptoms worsen or if the condition fails to improve as anticipated.  I provided 15 minutes of non-face-to-face time during this encounter.  [employer]. Harle Battiest Avs printed and mailed

## 2020-11-20 NOTE — Patient Instructions (Addendum)
Cynthia Watkins , Thank you for taking time to come for your Medicare Wellness Visit. I appreciate your ongoing commitment to your health goals. Please review the following plan we discussed and let me know if I can assist you in the future.   Screening recommendations/referrals: Colonoscopy aged out Mammogram aged out Bone Density aged out Recommended yearly ophthalmology/optometry visit for glaucoma screening and checkup Recommended yearly dental visit for hygiene and checkup  Vaccinations: Influenza vaccine -recommended yearly Pneumococcal vaccine up to date Tdap vaccine RECOMMENDED_  to get at your local pharmacy Shingles vaccine up to date    Advanced directives: on file.   Conditions/risks identified: advanced age.   Next appointment: yearly for AWV   Preventive Care 50 Years and Older, Female Preventive care refers to lifestyle choices and visits with your health care provider that can promote health and wellness. What does preventive care include? A yearly physical exam. This is also called an annual well check. Dental exams once or twice a year. Routine eye exams. Ask your health care provider how often you should have your eyes checked. Personal lifestyle choices, including: Daily care of your teeth and gums. Regular physical activity. Eating a healthy diet. Avoiding tobacco and drug use. Limiting alcohol use. Practicing safe sex. Taking low-dose aspirin every day. Taking vitamin and mineral supplements as recommended by your health care provider. What happens during an annual well check? The services and screenings done by your health care provider during your annual well check will depend on your age, overall health, lifestyle risk factors, and family history of disease. Counseling  Your health care provider may ask you questions about your: Alcohol use. Tobacco use. Drug use. Emotional well-being. Home and relationship well-being. Sexual activity. Eating  habits. History of falls. Memory and ability to understand (cognition). Work and work Statistician. Reproductive health. Screening  You may have the following tests or measurements: Height, weight, and BMI. Blood pressure. Lipid and cholesterol levels. These may be checked every 5 years, or more frequently if you are over 72 years old. Skin check. Lung cancer screening. You may have this screening every year starting at age 58 if you have a 30-pack-year history of smoking and currently smoke or have quit within the past 15 years. Fecal occult blood test (FOBT) of the stool. You may have this test every year starting at age 62. Flexible sigmoidoscopy or colonoscopy. You may have a sigmoidoscopy every 5 years or a colonoscopy every 10 years starting at age 56. Hepatitis C blood test. Hepatitis B blood test. Sexually transmitted disease (STD) testing. Diabetes screening. This is done by checking your blood sugar (glucose) after you have not eaten for a while (fasting). You may have this done every 1-3 years. Bone density scan. This is done to screen for osteoporosis. You may have this done starting at age 64. Mammogram. This may be done every 1-2 years. Talk to your health care provider about how often you should have regular mammograms. Talk with your health care provider about your test results, treatment options, and if necessary, the need for more tests. Vaccines  Your health care provider may recommend certain vaccines, such as: Influenza vaccine. This is recommended every year. Tetanus, diphtheria, and acellular pertussis (Tdap, Td) vaccine. You may need a Td booster every 10 years. Zoster vaccine. You may need this after age 31. Pneumococcal 13-valent conjugate (PCV13) vaccine. One dose is recommended after age 66. Pneumococcal polysaccharide (PPSV23) vaccine. One dose is recommended after age 28. Talk to  your health care provider about which screenings and vaccines you need and how  often you need them. This information is not intended to replace advice given to you by your health care provider. Make sure you discuss any questions you have with your health care provider. Document Released: 01/19/2015 Document Revised: 09/12/2015 Document Reviewed: 10/24/2014 Elsevier Interactive Patient Education  2017 [employer] Prevention in the Home Falls can cause injuries. They can happen to people of all ages. There are many things you can do to make your home safe and to help prevent falls. What can I do on the outside of my home? Regularly fix the edges of walkways and driveways and fix any cracks. Remove anything that might make you trip as you walk through a door, such as a raised step or threshold. Trim any bushes or trees on the path to your home. Use bright outdoor lighting. Clear any walking paths of anything that might make someone trip, such as rocks or tools. Regularly check to see if handrails are loose or broken. Make sure that both sides of any steps have handrails. Any raised decks and porches should have guardrails on the edges. Have any leaves, snow, or ice cleared regularly. Use sand or salt on walking paths during winter. Clean up any spills in your garage right away. This includes oil or grease spills. What can I do in the bathroom? Use night lights. Install grab bars by the toilet and in the tub and shower. Do not use towel bars as grab bars. Use non-skid mats or decals in the tub or shower. If you need to sit down in the shower, use a plastic, non-slip stool. Keep the floor dry. Clean up any water that spills on the floor as soon as it happens. Remove soap buildup in the tub or shower regularly. Attach bath mats securely with double-sided non-slip rug tape. Do not have throw rugs and other things on the floor that can make you trip. What can I do in the bedroom? Use night lights. Make sure that you have a light by your bed that is easy to  reach. Do not use any sheets or blankets that are too big for your bed. They should not hang down onto the floor. Have a firm chair that has side arms. You can use this for support while you get dressed. Do not have throw rugs and other things on the floor that can make you trip. What can I do in the kitchen? Clean up any spills right away. Avoid walking on wet floors. Keep items that you use a lot in easy-to-reach places. If you need to reach something above you, use a strong step stool that has a grab bar. Keep electrical cords out of the way. Do not use floor polish or wax that makes floors slippery. If you must use wax, use non-skid floor wax. Do not have throw rugs and other things on the floor that can make you trip. What can I do with my stairs? Do not leave any items on the stairs. Make sure that there are handrails on both sides of the stairs and use them. Fix handrails that are broken or loose. Make sure that handrails are as long as the stairways. Check any carpeting to make sure that it is firmly attached to the stairs. Fix any carpet that is loose or worn. Avoid having throw rugs at the top or bottom of the stairs. If you do have throw rugs, attach  them to the floor with carpet tape. Make sure that you have a light switch at the top of the stairs and the bottom of the stairs. If you do not have them, ask someone to add them for you. What else can I do to help prevent falls? Wear shoes that: Do not have high heels. Have rubber bottoms. Are comfortable and fit you well. Are closed at the toe. Do not wear sandals. If you use a stepladder: Make sure that it is fully opened. Do not climb a closed stepladder. Make sure that both sides of the stepladder are locked into place. Ask someone to hold it for you, if possible. Clearly mark and make sure that you can see: Any grab bars or handrails. First and last steps. Where the edge of each step is. Use tools that help you move  around (mobility aids) if they are needed. These include: Canes. Walkers. Scooters. Crutches. Turn on the lights when you go into a dark area. Replace any light bulbs as soon as they burn out. Set up your furniture so you have a clear path. Avoid moving your furniture around. If any of your floors are uneven, fix them. If there are any pets around you, be aware of where they are. Review your medicines with your doctor. Some medicines can make you feel dizzy. This can increase your chance of falling. Ask your doctor what other things that you can do to help prevent falls. This information is not intended to replace advice given to you by your health care provider. Make sure you discuss any questions you have with your health care provider. Document Released: 10/19/2008 Document Revised: 05/31/2015 Document Reviewed: 01/27/2014 Elsevier Interactive Patient Education  2017 [employer].

## 2020-11-20 NOTE — Telephone Encounter (Signed)
[name], Cynthia Watkins are scheduled for a virtual visit with your provider today.    Just as we do with appointments in the office, we must obtain your consent to participate.  Your consent will be active for this visit and any virtual visit you may have with one of our providers in the next 365 days.    If you have a MyChart account, I can also send a copy of this consent to you electronically.  All virtual visits are billed to your insurance company just like a traditional visit in the office.  As this is a virtual visit, video technology does not allow for your provider to perform a traditional examination.  This may limit your provider's ability to fully assess your condition.  If your provider identifies any concerns that need to be evaluated in person or the need to arrange testing such as labs, EKG, etc, we will make arrangements to do so.    Although advances in technology are sophisticated, we cannot ensure that it will always work on either your end or our end.  If the connection with a video visit is poor, we may have to switch to a telephone visit.  With either a video or telephone visit, we are not always able to ensure that we have a secure connection.   I need to obtain your verbal consent now.   Are you willing to proceed with your visit today?   Cynthia Watkins has provided verbal consent on 11/20/2020 for a virtual visit (video or telephone).   Carroll Kinds, CMA 11/20/2020  11:42 AM

## 2020-12-11 NOTE — Progress Notes (Deleted)
Cardiology Office Note:    Date:  12/11/2020   ID:  Cynthia Watkins, DOB 1924/05/07, MRN [id number]  PCP:  Royal Hawthorn, NP [place] Cardiologist: [name], MD   Reason for visit: Follow-up  History of Present Illness:    Cynthia Watkins is a 87 y.o. female with a hx of hypertension, hyperlipidemia, RBBB and history of palpitations.  Patient was previously seen for dizziness and palpitations in 2016.  Event monitor showed no significant arrhythmia.  Echocardiogram obtained on 07/13/2014 showed EF 55 to 60%, no regional wall motion abnormality, mild AI, mild to moderate MR, mild LAE.  All of her antihypertensive medication were discontinued.  She was seen again in 2017 for increased palpitation, repeat event monitor showed no significant arrhythmia other than PACs and one brief run of NSVT.    She had a telemedicine appointment with Almyra Deforest in October 2021 and was doing well.  (There was a single episode of tachypalpitations for which she took extra dose of atenolol and has not had any further symptoms.  She continued to do well at [facility] and stay active and walk for about an hour a day.  Instead of on the 25 mg atenolol tablet and cut in quarters, she is actually on 6.25 of atenolol capsule)  Palpitations -*** (Instead of taking 1/4 tablet of 25 mg atenolol, she was able to obtain 6.25 mg capsules.)  Hypertension -*** -Goal BP is <130/80.  Recommend DASH diet (high in vegetables, fruits, low-fat dairy products, whole grains, poultry, fish, and nuts and low in sweets, sugar-sweetened beverages, and red meats), salt restriction and increase physical activity.  Disposition - Follow-up in ***     Past Medical History:  Diagnosis Date   Acute upper respiratory infections of unspecified site 06/09/2011   Breast cancer ([place]) 12/11/2008   Fatigue 01/2012   Hypercholesterolemia    on lipitor   Hypertension    Hyponatremia 01/2016   Memory loss    Mitral valve prolapse     Other abnormal blood chemistry 01/21/2010   Palpitations 08/05/2000   Right bundle branch block    Scoliosis    Senile osteoporosis 09/28/2001    Past Surgical History:  Procedure Laterality Date   BREAST LUMPECTOMY Right 12/27/2007   LAPAROSCOPIC LYSIS OF ADHESIONS  07/15/2006   [name]   OTHER SURGICAL HISTORY     TONSILLECTOMY     TUBAL LIGATION  1963    Current Medications: No outpatient medications have been marked as taking for the 12/12/20 encounter (Appointment) with Warren Lacy, PA-C.     Allergies:   Patient has no known allergies.   Social History   Socioeconomic History   Marital status: Widowed    Spouse name: Not on file   Number of children: 3   Years of education: Not on file   Highest education level: Not on file  Occupational History   Occupation: Retired Product manager: RETIRED  Tobacco Use   Smoking status: Former    Packs/day: 0.25    Years: 10.00    Pack years: 2.50    Types: Cigarettes    Quit date: 08/06/1954    Years since quitting: 66.3   Smokeless tobacco: Never  Vaping Use   Vaping Use: Never used  Substance and Sexual Activity   Alcohol use: Yes    Comment: Occasionally    Drug use: No   Sexual activity: Never  Other Topics Concern   Not on file  Social History Narrative   Lives at [facility] since 05/1998   Widowed   Living will   Former smoker, stopped 1956   Alcohol  Rare   Exercise - walk one hour daily 7 days a week,  3 days of stretching and strengthen, yoga 45 minutes 2 days a week   Social Determinants of Radio broadcast assistant Strain: Not on file  Food Insecurity: Not on file  Transportation Needs: Not on file  Physical Activity: Not on file  Stress: Not on file  Social Connections: Not on file     Family History: The patient's family history includes Alzheimer's disease in her sister; Cancer in her father; Stroke in her mother.  ROS:   Please see the history of present illness.      EKGs/Labs/Other Studies Reviewed:    EKG:  The ekg ordered today demonstrates ***  Recent Labs: 08/28/2020: Hemoglobin 15.0; Platelets 261 11/16/2020: ALT 9; BUN 15; Creatinine 0.8; Potassium 4.7; Sodium 137   Recent Lipid Panel Lab Results  Component Value Date/Time   CHOL 369 (A) 08/28/2020 12:00 AM   TRIG 170 (A) 08/28/2020 12:00 AM   HDL 51 08/28/2020 12:00 AM   LDLCALC 284 08/28/2020 12:00 AM    Physical Exam:    VS:  There were no vitals taken for this visit.   No data found.  Wt Readings from Last 3 Encounters:  08/29/20 117 lb 3.2 oz (53.2 kg)  07/12/20 118 lb 9.7 oz (53.8 kg)  05/21/20 118 lb (53.5 kg)     GEN: *** Well nourished, well developed in no acute distress HEENT: Normal NECK: No JVD; No carotid bruits CARDIAC: ***RRR, no murmurs, rubs, gallops RESPIRATORY:  Clear to auscultation without rales, wheezing or rhonchi  ABDOMEN: Soft, non-tender, non-distended MUSCULOSKELETAL: No edema; No deformity  SKIN: Warm and dry NEUROLOGIC:  Alert and oriented PSYCHIATRIC:  Normal affect     ASSESSMENT AND PLAN   ***   {Are you ordering a CV Procedure (e.g. stress test, cath, DCCV, TEE, etc)?   Press F2        :[id number]}    Medication Adjustments/Labs and Tests Ordered: Current medicines are reviewed at length with the patient today.  Concerns regarding medicines are outlined above.  No orders of the defined types were placed in this encounter.  No orders of the defined types were placed in this encounter.   There are no Patient Instructions on file for this visit.   Signed, Warren Lacy, PA-C  12/11/2020 2:39 PM    [facility]

## 2020-12-12 ENCOUNTER — Ambulatory Visit: Payer: Medicare Other | Admitting: Physician Assistant

## 2020-12-12 DIAGNOSIS — I10 Essential (primary) hypertension: Secondary | ICD-10-CM

## 2020-12-12 DIAGNOSIS — R002 Palpitations: Secondary | ICD-10-CM

## 2020-12-26 ENCOUNTER — Other Ambulatory Visit: Payer: Self-pay

## 2020-12-26 ENCOUNTER — Non-Acute Institutional Stay: Payer: Medicare Other | Admitting: Internal Medicine

## 2020-12-26 ENCOUNTER — Encounter: Payer: Self-pay | Admitting: Internal Medicine

## 2020-12-26 VITALS — BP 102/70 | HR 61 | Temp 97.7°F | Ht 63.0 in | Wt 118.6 lb

## 2020-12-26 DIAGNOSIS — G3184 Mild cognitive impairment, so stated: Secondary | ICD-10-CM

## 2020-12-26 DIAGNOSIS — E871 Hypo-osmolality and hyponatremia: Secondary | ICD-10-CM

## 2020-12-26 DIAGNOSIS — R002 Palpitations: Secondary | ICD-10-CM

## 2020-12-26 DIAGNOSIS — M810 Age-related osteoporosis without current pathological fracture: Secondary | ICD-10-CM

## 2020-12-26 NOTE — Progress Notes (Signed)
Location:  [place] of Service:  Clinic (12)  Provider:   Code Status: Goals of Care:  Advanced Directives 11/20/2020  Does Patient Have a Medical Advance Directive? No  Type of Advance Directive -  Does patient want to make changes to medical advance directive? -  Copy of [place] in Chart? -  Would patient like information on creating a medical advance directive? -  Pre-existing out of facility DNR order (yellow form or pink MOST form) -     Chief Complaint  Patient presents with   Medical Management of Chronic Issues    Patient returns to the clinic for worsening dementia. Patient states she was just told to come today    Quality Metric Gaps    Flu shot    HPI: Patient is a 87 y.o. female seen today for medical management of chronic diseases.   Patient lives in [place]   Has h/o Hyponatremia, Palpitations, Osteoporosis, Hyperlipidemia, MCI  Patient has a history of cognitive impairment with last MMSE of 28/30 in 02/21 She came to the clinic today with no acute complaints.  We were not sure why patient was here.  Patient did not know either.  She said she was told to come for her appointment.  According to the patient she continues to do well and has not had any recent acute issue. I also discussed with the facility nurse. According to the patient she continues to be independent in her ADLs and has had no falls  Past Medical History:  Diagnosis Date   Acute upper respiratory infections of unspecified site 06/09/2011   Breast cancer ([place]) 12/11/2008   Fatigue 01/2012   Hypercholesterolemia    on lipitor   Hypertension    Hyponatremia 01/2016   Memory loss    Mitral valve prolapse    Other abnormal blood chemistry 01/21/2010   Palpitations 08/05/2000   Right bundle branch block    Scoliosis    Senile osteoporosis 09/28/2001    Past Surgical History:  Procedure Laterality Date   BREAST LUMPECTOMY Right  12/27/2007   LAPAROSCOPIC LYSIS OF ADHESIONS  07/15/2006   [name]   OTHER SURGICAL HISTORY     TONSILLECTOMY     TUBAL LIGATION  1963    No Known Allergies  Outpatient Encounter Medications as of 12/26/2020  Medication Sig   atenolol (TENORMIN) 25 MG tablet Take 1/4 tablet daily (Patient taking differently: Taking 6.25mg  Daily capsule formulary)   Cholecalciferol (VITAMIN D3) 1000 units CAPS Take 1,000 Units by mouth daily.    No facility-administered encounter medications on file as of 12/26/2020.    Review of Systems:  Review of Systems  Constitutional:  Negative for activity change and appetite change.  HENT: Negative.    Respiratory:  Negative for cough and shortness of breath.   Cardiovascular:  Negative for leg swelling.  Gastrointestinal:  Negative for constipation.  Genitourinary: Negative.   Musculoskeletal:  Negative for arthralgias, gait problem and myalgias.  Skin: Negative.   Neurological:  Negative for dizziness and weakness.  Psychiatric/Behavioral:  Negative for confusion, dysphoric mood and sleep disturbance.    Health Maintenance  Topic Date Due   INFLUENZA VACCINE  08/06/2020   COVID-19 Vaccine (5 - Booster for Moderna series) 12/14/2020   TETANUS/TDAP  05/14/2027   Pneumonia Vaccine 48+ Years old  Completed   DEXA SCAN  Completed   Zoster Vaccines- Shingrix  Completed   HPV VACCINES  Aged Out  Physical Exam: Vitals:   12/26/20 1015  BP: 102/70  Pulse: 61  Temp: 97.7 F (36.5 C)  SpO2: 95%  Weight: 118 lb 9.6 oz (53.8 kg)  Height: 5\' 3"  (1.6 m)   Body mass index is 21.01 kg/m. Physical Exam Vitals reviewed.  Constitutional:      Appearance: Normal appearance.  HENT:     Head: Normocephalic.     Nose: Nose normal.     Mouth/Throat:     Mouth: Mucous membranes are moist.     Pharynx: Oropharynx is clear.  Eyes:     Pupils: Pupils are equal, round, and reactive to light.  Cardiovascular:     Rate and Rhythm: Normal rate and  regular rhythm.     Pulses: Normal pulses.     Heart sounds: Normal heart sounds. No murmur heard. Pulmonary:     Effort: Pulmonary effort is normal.     Breath sounds: Normal breath sounds.  Abdominal:     General: Abdomen is flat. Bowel sounds are normal.     Palpations: Abdomen is soft.  Musculoskeletal:        General: No swelling.     Cervical back: Neck supple.  Skin:    General: Skin is warm.  Neurological:     General: No focal deficit present.     Mental Status: She is alert.  Psychiatric:        Mood and Affect: Mood normal.        Thought Content: Thought content normal.    Labs reviewed: Basic Metabolic Panel: Recent Labs    05/21/20 1444 07/12/20 1045 08/28/20 0000 11/15/20 0000 11/16/20 0000  NA 135 134* 137  --  137  K 4.1 3.9 4.4  --  4.7  CL 102 100 99  --  99  CO2 24 26 25*  --  24*  GLUCOSE 97 103*  --   --   --   BUN 22 23 14   --  15  CREATININE 0.75 0.77 0.8  --  0.8  CALCIUM 9.0 9.7 9.2 9.5  --    Liver Function Tests: Recent Labs    11/15/20 0000 11/16/20 0000  AST  --  15  ALT  --  9  ALKPHOS  --  81  ALBUMIN 4.1  --    No results for input(s): LIPASE, AMYLASE in the last 8760 hours. No results for input(s): AMMONIA in the last 8760 hours. CBC: Recent Labs    05/21/20 1444 07/12/20 1045 08/28/20 0000  WBC 7.3 5.8 6.1  HGB 14.0 14.6 15.0  HCT 42.0 43.0 43  MCV 99.3 97.3  --   PLT 283 285 261   Lipid Panel: Recent Labs    08/28/20 0000  CHOL 369*  HDL 51  LDLCALC 284  TRIG 170*   Lab Results  Component Value Date   HGBA1C 5.9 (H) 01/23/2016    Procedures since last visit: No results found.  Assessment/Plan 1. Palpitations No Recent Symptoms  On Toprol Very Low dose and seems to be helping Sees Cardiology Annually Had one visit to ED in 5/22 for Palpitations and extra Toprol helped her go back to normal [place] 2. Mild cognitive impairment Were unable to Repeat MMSE today If she continues to have issue can  make Speech therapy referal if Family agrees  3. Senile osteoporosis Not interested in Treatment right  now  4. Hyponatremia Sodium staying stable    Labs/tests ordered:  * No order type specified *  Next appt:  06/24/2021

## 2021-01-14 NOTE — Progress Notes (Addendum)
Cardiology Office Note:    Date:  01/15/2021   ID:  Cynthia Watkins, DOB [birth year], MRN [id number]  PCP:  Royal Hawthorn, NP [place] Cardiologist: [name], MD   Reason for visit: Follow-up   History of Present Illness:    Cynthia Watkins is a 88 y.o. female with a hx of hypertension, hyperlipidemia, right bundle branch block, palpitations.    She was last seen via telemedicine appointment in October 2021.  She had a single episode of palpitations which she took a extra dose of atenolol.  (She takes atenolol 6.25 mg tablets)  She was walking about an hour a day.  She went to the ED for palpitations in May 2022.  She felt like her heart was racing and staff from [facility] checked her pulse which was 120.  Patient took extra dose of atenolol and symptoms resolved.  EKG normal in the ER.  All of her lab work including CBC, BMP, troponin and chest x-ray were all within normal limits.  Today, she states she is doing well with no recent palpitations.  Denies chest pain, shortness of breath, PND, orthopnea, lower extremity edema, lightheadedness and syncope.  She lives independently at [facility] facility.  She walks 15 minutes 4 times a day.      Past Medical History:  Diagnosis Date   Acute upper respiratory infections of unspecified site 06/09/2011   Breast cancer ([place]) 12/11/2008   Fatigue 01/2012   Hypercholesterolemia    on lipitor   Hypertension    Hyponatremia 01/2016   Memory loss    Mitral valve prolapse    Other abnormal blood chemistry 01/21/2010   Palpitations 08/05/2000   Right bundle branch block    Scoliosis    Senile osteoporosis 09/28/2001    Past Surgical History:  Procedure Laterality Date   BREAST LUMPECTOMY Right 12/27/2007   LAPAROSCOPIC LYSIS OF ADHESIONS  07/15/2006   [name]   OTHER SURGICAL HISTORY     TONSILLECTOMY     TUBAL LIGATION  1963    Current Medications: Current Meds  Medication Sig   atenolol (TENORMIN) 25 MG tablet Take  1/4 tablet daily (Patient taking differently: Taking 6.25mg  Daily capsule formulary)   Cholecalciferol (VITAMIN D3) 1000 units CAPS Take 1,000 Units by mouth daily.      Allergies:   Patient has no known allergies.   Social History   Socioeconomic History   Marital status: Widowed    Spouse name: Not on file   Number of children: 3   Years of education: Not on file   Highest education level: Not on file  Occupational History   Occupation: Retired Product manager: RETIRED  Tobacco Use   Smoking status: Former    Packs/day: 0.25    Years: 10.00    Pack years: 2.50    Types: Cigarettes    Quit date: 08/06/1954    Years since quitting: 66.4   Smokeless tobacco: Never  Vaping Use   Vaping Use: Never used  Substance and Sexual Activity   Alcohol use: Yes    Comment: Occasionally    Drug use: No   Sexual activity: Never  Other Topics Concern   Not on file  Social History Narrative   Lives at [facility] since 05/1998   Widowed   Living will   Former smoker, stopped 1956   Alcohol  Rare   Exercise - walk one hour daily 7 days a week,  3 days of stretching and  strengthen, yoga 45 minutes 2 days a week   Social Determinants of Health   Financial Resource Strain: Not on file  Food Insecurity: Not on file  Transportation Needs: Not on file  Physical Activity: Not on file  Stress: Not on file  Social Connections: Not on file     Family History: The patient's family history includes Alzheimer's disease in her sister; Cancer in her father; Stroke in her mother.  ROS:   Please see the history of present illness.     EKGs/Labs/Other Studies Reviewed:    EKG:  The ekg ordered today demonstrates normal sinus rhythm, left axis deviation, chronic right bundle branch block, heart rate 61, PR interval 198 ms, QRS duration 134 ms.  Recent Labs: 08/28/2020: Hemoglobin 15.0; Platelets 261 11/16/2020: ALT 9; BUN 15; Creatinine 0.8; Potassium 4.7; Sodium 137   Recent Lipid  Panel Lab Results  Component Value Date/Time   CHOL 369 (A) 08/28/2020 12:00 AM   TRIG 170 (A) 08/28/2020 12:00 AM   HDL 51 08/28/2020 12:00 AM   LDLCALC 284 08/28/2020 12:00 AM    Physical Exam:    VS:  BP 128/76    Pulse 61    Ht 5\' 1"  (1.549 m)    Wt 118 lb 12.8 oz (53.9 kg)    SpO2 96%    BMI 22.45 kg/m    No data found.  Wt Readings from Last 3 Encounters:  01/15/21 118 lb 12.8 oz (53.9 kg)  12/26/20 118 lb 9.6 oz (53.8 kg)  08/29/20 117 lb 3.2 oz (53.2 kg)     GEN:  Well nourished, well developed in no acute distress, elderly HEENT: Normal NECK: No JVD; No carotid bruits CARDIAC: RRR, no murmurs, rubs, gallops RESPIRATORY:  Clear to auscultation without rales, wheezing or rhonchi  ABDOMEN: Soft, non-tender, non-distended MUSCULOSKELETAL: No edema; No deformity  SKIN: Warm and dry NEUROLOGIC:  Alert and oriented PSYCHIATRIC:  Normal affect     ASSESSMENT AND PLAN   Palpitations, rare -Denies recent palpitations.  EKG shows normal sinus rhythm with known right bundle branch block, heart rate 61. -Continue atenolol 6.25 mg daily, she gets a special formulation for this dose.  She takes extra dose of atenolol as needed for palpitations.  Hypertension, well controlled -Continue current medications.  Hyperlipidemia -Total cholesterol 369, LDL 284, HDL 51, triglycerides 170. -Given age and no hx of CAD, statin provides less long-term benefit.  Disposition - Follow-up in 1 year with Dr. Martinique.     Medication Adjustments/Labs and Tests Ordered: Current medicines are reviewed at length with the patient today.  Concerns regarding medicines are outlined above.  No orders of the defined types were placed in this encounter.  No orders of the defined types were placed in this encounter.   Patient Instructions  Medication Instructions:  No Changes *If you need a refill on your cardiac medications before your next appointment, please call your pharmacy*   Lab  Work: No Labs If you have labs (blood work) drawn today and your tests are completely normal, you will receive your results only by: [place] (if you have MyChart) OR A paper copy in the mail If you have any lab test that is abnormal or we need to change your treatment, we will call you to review the results.   Testing/Procedures: No Testing   Follow-Up: At [facility], you and your health needs are our priority.  As part of our continuing mission to provide you with exceptional heart care,  we have created designated Provider Care Teams.  These Care Teams include your primary Cardiologist (physician) and Advanced Practice Providers (APPs -  Physician Assistants and Nurse Practitioners) who all work together to provide you with the care you need, when you need it.  We recommend signing up for the patient portal called "MyChart".  Sign up information is provided on this After Visit Summary.  MyChart is used to connect with patients for Virtual Visits (Telemedicine).  Patients are able to view lab/test results, encounter notes, upcoming appointments, etc.  Non-urgent messages can be sent to your provider as well.   To learn more about what you can do with MyChart, go to [email or web address].    Your next appointment:   1 year(s)  The format for your next appointment:   In Person  Provider:   [name], MD        Signed, Warren Lacy, PA-C  01/15/2021 11:47 AM    [place]

## 2021-01-15 ENCOUNTER — Other Ambulatory Visit: Payer: Self-pay

## 2021-01-15 ENCOUNTER — Ambulatory Visit: Payer: Medicare Other | Admitting: Physician Assistant

## 2021-01-15 ENCOUNTER — Encounter: Payer: Self-pay | Admitting: Physician Assistant

## 2021-01-15 VITALS — BP 128/76 | HR 61 | Ht 61.0 in | Wt 118.8 lb

## 2021-01-15 DIAGNOSIS — R002 Palpitations: Secondary | ICD-10-CM

## 2021-01-15 DIAGNOSIS — E7800 Pure hypercholesterolemia, unspecified: Secondary | ICD-10-CM

## 2021-01-15 DIAGNOSIS — I10 Essential (primary) hypertension: Secondary | ICD-10-CM | POA: Diagnosis not present

## 2021-01-15 NOTE — Patient Instructions (Signed)
Medication Instructions:  No Changes *If you need a refill on your cardiac medications before your next appointment, please call your pharmacy*   Lab Work: No Labs If you have labs (blood work) drawn today and your tests are completely normal, you will receive your results only by: [place] (if you have MyChart) OR A paper copy in the mail If you have any lab test that is abnormal or we need to change your treatment, we will call you to review the results.   Testing/Procedures: No Testing   Follow-Up: At [facility], you and your health needs are our priority.  As part of our continuing mission to provide you with exceptional heart care, we have created designated Provider Care Teams.  These Care Teams include your primary Cardiologist (physician) and Advanced Practice Providers (APPs -  Physician Assistants and Nurse Practitioners) who all work together to provide you with the care you need, when you need it.  We recommend signing up for the patient portal called "MyChart".  Sign up information is provided on this After Visit Summary.  MyChart is used to connect with patients for Virtual Visits (Telemedicine).  Patients are able to view lab/test results, encounter notes, upcoming appointments, etc.  Non-urgent messages can be sent to your provider as well.   To learn more about what you can do with MyChart, go to [email or web address].    Your next appointment:   1 year(s)  The format for your next appointment:   In Person  Provider:   [name], MD

## 2021-01-22 NOTE — Addendum Note (Signed)
Addended by: Merri Ray A on: 01/22/2021 08:31 AM   Modules accepted: Orders

## 2021-03-04 ENCOUNTER — Encounter: Payer: Medicare Other | Admitting: Adult Health

## 2021-03-21 ENCOUNTER — Other Ambulatory Visit: Payer: Self-pay

## 2021-03-21 ENCOUNTER — Emergency Department (HOSPITAL_BASED_OUTPATIENT_CLINIC_OR_DEPARTMENT_OTHER)
Admission: EM | Admit: 2021-03-21 | Discharge: 2021-03-21 | Disposition: A | Discharge status: Home / Self Care | Payer: Medicare Other | Attending: Emergency Medicine | Admitting: Emergency Medicine

## 2021-03-21 DIAGNOSIS — I4891 Unspecified atrial fibrillation: Secondary | ICD-10-CM | POA: Insufficient documentation

## 2021-03-21 DIAGNOSIS — R002 Palpitations: Secondary | ICD-10-CM | POA: Diagnosis present

## 2021-03-21 LAB — CBC
HCT: 42.8 % (ref 36.0–46.0)
Hemoglobin: 14.5 g/dL (ref 12.0–15.0)
MCH: 32.4 pg (ref 26.0–34.0)
MCHC: 33.9 g/dL (ref 30.0–36.0)
MCV: 95.7 fL (ref 80.0–100.0)
Platelets: 299 10*3/uL (ref 150–400)
RBC: 4.47 MIL/uL (ref 3.87–5.11)
RDW: 14 % (ref 11.5–15.5)
WBC: 7.4 10*3/uL (ref 4.0–10.5)
nRBC: 0 % (ref 0.0–0.2)

## 2021-03-21 LAB — BASIC METABOLIC PANEL
Anion gap: 9 (ref 5–15)
BUN: 23 mg/dL (ref 8–23)
CO2: 27 mmol/L (ref 22–32)
Calcium: 10.7 mg/dL — ABNORMAL HIGH (ref 8.9–10.3)
Chloride: 99 mmol/L (ref 98–111)
Creatinine, Ser: 0.81 mg/dL (ref 0.44–1.00)
GFR, Estimated: >60 mL/min (ref >60)
Glucose, Bld: 162 mg/dL — ABNORMAL HIGH (ref 70–99)
Potassium: 3.7 mmol/L (ref 3.5–5.1)
Sodium: 135 mmol/L (ref 135–145)

## 2021-03-21 LAB — MAGNESIUM: Magnesium: 2.1 mg/dL (ref 1.7–2.4)

## 2021-03-21 MED ORDER — METOPROLOL TARTRATE 5 MG/5ML IV SOLN
5.0000 mg | INTRAVENOUS | Status: DC | PRN
Start: 2021-03-21 — End: 2021-03-21

## 2021-03-21 MED ORDER — APIXABAN 2.5 MG PO TABS: 2.5000 mg | ORAL_TABLET | Freq: Two times a day (BID) | ORAL | Status: DC

## 2021-03-21 MED ORDER — SODIUM CHLORIDE 0.9 % IV BOLUS
500.0000 mL | Freq: Once | INTRAVENOUS | Status: AC
  Administered 2021-03-21: 500 mL via INTRAVENOUS

## 2021-03-21 MED ORDER — METOPROLOL TARTRATE 5 MG/5ML IV SOLN
5.0000 mg | INTRAVENOUS | Status: DC | PRN
  Administered 2021-03-21: 5 mg via INTRAVENOUS
  Filled 2021-03-21: qty 5

## 2021-03-21 NOTE — ED Triage Notes (Signed)
Pt states she does not feel well ? ?Son states she has a hx of atrial fibrillation at home HR was 147, BP 119/75 ?

## 2021-03-21 NOTE — Discharge Instructions (Signed)
Please follow-up with Dr. Doug Sou office to discuss management of your A-fib and your medications including atenolol and potential blood thinner.  If she has further episodes of palpitations, any chest discomfort or difficulty in breathing, please return to ER for reassessment. ?

## 2021-03-21 NOTE — ED Provider Notes (Signed)
?[facility]ERGENC[facility] DEPT ?Provider Note ? ? ?CSN: [id number] ?Arrival date & time: 03/21/21  2201 ? ?  ? ?History ? ?Chief Complaint  ?Patient presents with  ? Tachycardia  ? ? ?Cynthia Watkins is a 88 y.o. female.  Presented to the emergency room with concern for rapid heart rate, palpitations.  Patient states that she was in her regular state of health, was not have any particular symptoms when this evening she felt sudden heart racing sensation and her heart rate was noted to be elevated in the 140s.  Her children at bedside provide additional history.  Son is a Dealer.  Has had increased activity level over the past couple days but she has not had any complaints of chest pain or difficulty in breathing.  Patient states that currently she feels no symptoms. ? ?Has history of prior episodes of A-fib but due to the rare nature of actually being in atrial fibrillation, son states that the decision was made for her not to be on long-term anticoagulation. ? ?Reviewed chart, noted last cardiology visit in January 2023, rare episode of palpitation patient is, on atenolol 6.25 mg daily. ? ?HPI ? ?  ? ?Home Medications ?Prior to Admission medications   ?Medication Sig Start Date End Date Taking? Authorizing Provider  ?atenolol (TENORMIN) 25 MG tablet Take 1/4 tablet daily ?Patient taking differently: Taking 6.'25mg'$  Daily capsule formulary 11/25/18  Yes Martinique, Peter M, MD  ?Cholecalciferol (VITAMIN D3) 1000 units CAPS Take 1,000 Units by mouth daily.    Yes [provider]  ?   ? ?Allergies    ?Patient has no known allergies.   ? ?Review of Systems   ?Review of Systems  ?Constitutional:  Negative for chills and fever.  ?HENT:  Negative for ear pain and sore throat.   ?Eyes:  Negative for pain and visual disturbance.  ?Respiratory:  Negative for cough and shortness of breath.   ?Cardiovascular:  Positive for palpitations. Negative for chest pain.  ?Gastrointestinal:  Negative for abdominal pain and  vomiting.  ?Genitourinary:  Negative for dysuria and hematuria.  ?Musculoskeletal:  Negative for arthralgias and back pain.  ?Skin:  Negative for color change and rash.  ?Neurological:  Negative for seizures and syncope.  ?All other systems reviewed and are negative. ? ?Physical Exam ?Updated Vital Signs ?BP 117/74   Pulse 62   Temp (!) 97.1 ?F (36.2 ?C)   Resp 17   Ht '5\' 1"'$  (1.549 m)   SpO2 95%   BMI 22.45 kg/m?  ?Physical Exam ?Vitals and nursing note reviewed.  ?Constitutional:   ?   General: She is not in acute distress. ?   Appearance: She is well-developed.  ?HENT:  ?   Head: Normocephalic and atraumatic.  ?Eyes:  ?   Conjunctiva/sclera: Conjunctivae normal.  ?Cardiovascular:  ?   Rate and Rhythm: Normal rate and regular rhythm.  ?   Heart sounds: No murmur heard. ?Pulmonary:  ?   Effort: Pulmonary effort is normal. No respiratory distress.  ?   Breath sounds: Normal breath sounds.  ?Abdominal:  ?   Palpations: Abdomen is soft.  ?   Tenderness: There is no abdominal tenderness.  ?Musculoskeletal:     ?   General: No swelling.  ?   Cervical back: Neck supple.  ?Skin: ?   General: Skin is warm and dry.  ?   Capillary Refill: Capillary refill takes less than 2 seconds.  ?Neurological:  ?   Mental Status: She is alert.  ?  Psychiatric:     ?   Mood and Affect: Mood normal.  ? ? ?ED Results / Procedures / Treatments   ?Labs ?(all labs ordered are listed, but only abnormal results are displayed) ?Labs Reviewed  ?BASIC METABOLIC PANEL - Abnormal; Notable for the following components:  ?    Result Value  ? Glucose, Bld 162 (*)   ? Calcium 10.7 (*)   ? All other components within normal limits  ?MAGNESIUM  ?CBC  ? ? ?EKG ?EKG Interpretation ? ?Date/Time:  Thursday March 21 2021 22:12:30 EDT ?Ventricular Rate:  145 ?PR Interval:    ?QRS Duration: 114 ?QT Interval:  310 ?QTC Calculation: 481 ?R Axis:   -11 ?Text Interpretation: Atrial fibrillation with rapid ventricular response with premature ventricular or  aberrantly conducted complexes Right bundle branch block Abnormal ECG Confirmed by Madalyn Rob [phone]) on 03/21/2021 10:31:28 PM ? ?Radiology ?No results found. ? ?Procedures ?Procedures  ? ? ?Medications Ordered in ED ?Medications  ?metoprolol tartrate (LOPRESSOR) injection 5 mg (5 mg Intravenous Given 03/21/21 2246)  ?sodium chloride 0.9 % bolus 500 mL (0 mLs Intravenous Stopped 03/21/21 2332)  ? ? ?ED Course/ Medical Decision Making/ A&P ?  ?    CHA2DS2-VASc Score: 4 ?                   ?Medical Decision Making ?Amount and/or Complexity of Data Reviewed ?Labs: ordered. ? ?Risk ?Prescription drug management. ? ? ?88 year old lady presenting to the emergency room with concern for episode of palpitations, heart racing sensation.  On arrival here her heart rate was in the 140s and atrial fibrillation on cardiac monitor.  EKG reviewed, confirmed A-fib.  She was given single dose of metoprolol and her heart rate slowed down and then while being observed a little while later spontaneously converted to sinus rhythm with rate in 60s to 70s.  Her basic lab work is unremarkable, no electrolyte derangement.  Patient denies any ongoing symptoms, no chest pain at present or earlier today.  Suspect this is related to her underlying paroxysmal atrial fibrillation.  Her CHA2DS2-VASc score is elevated, I discussed this with her son who is a physician himself.  Reviewed risks and benefits of anticoagulation with patient and her son, they do not want to pursue coagulation at present but interested in discussing further with the cardiologist.  Patient was observed in ER and remained in sinus rhythm.  Feel she is stable discharge home at this time stressed need for close follow-up in the cardiology clinic. ? ?Additional history obtained from children, review of last cardiology note. ? ? ? ? ? ? ? ?Final Clinical Impression(s) / ED Diagnoses ?Final diagnoses:  ?Atrial fibrillation, unspecified type ([place])  ? ? ?Rx / DC Orders ?ED  Discharge Orders   ? ? None  ? ?  ? ? ?  ?Lucrezia Starch, MD ?03/21/21 2357 ? ?

## 2021-03-27 ENCOUNTER — Encounter: Payer: Medicare Other | Admitting: Physician Assistant

## 2021-03-27 NOTE — Progress Notes (Signed)
This encounter was created in error - please disregard.

## 2021-04-01 ENCOUNTER — Encounter: Payer: Self-pay | Admitting: Physician Assistant

## 2021-04-30 ENCOUNTER — Encounter: Payer: Self-pay | Admitting: Physician Assistant

## 2021-04-30 ENCOUNTER — Ambulatory Visit: Payer: Medicare Other | Admitting: Physician Assistant

## 2021-04-30 VITALS — BP 132/68 | HR 64 | Ht 61.0 in | Wt 115.8 lb

## 2021-04-30 DIAGNOSIS — I480 Paroxysmal atrial fibrillation: Secondary | ICD-10-CM | POA: Diagnosis not present

## 2021-04-30 DIAGNOSIS — I10 Essential (primary) hypertension: Secondary | ICD-10-CM

## 2021-04-30 DIAGNOSIS — E785 Hyperlipidemia, unspecified: Secondary | ICD-10-CM

## 2021-04-30 NOTE — Progress Notes (Signed)
?Cardiology Office Note:   ? ?Date:  05/02/2021  ? ?ID:  AARON BOEH, DOB 1924-09-27, MRN [id number] ? ?PCP:  Royal Hawthorn, NP ?  ?[facility] Providers ?Cardiologist:  [name], MD    ? ?Referring MD: Royal Hawthorn, NP  ? ?Chief Complaint  ?Patient presents with  ? Follow-up  ?  Seen for Dr. Martinique  ? ? ?History of Present Illness:   ? ?Cynthia Watkins is a 88 y.o. female with a hx of hypertension, hyperlipidemia, RBBB and history of palpitation.  Her son is a urologist.  Patient was previously seen for dizziness and palpitations in 2016.  Event monitor showed no significant arrhythmia.  Echocardiogram obtained on 07/13/2014 showed EF 55 to 60%, no regional wall motion abnormality, mild AI, mild to moderate MR, mild LAE.  All of her antihypertensive medication were discontinued.  She was seen again in 2017 for increased palpitation, repeat event monitor showed no significant arrhythmia other than PACs and one brief run of NSVT.  In January 2018, patient was admitted to the hospital with GI illness and acute encephalopathy with severe hyponatremia.  She was treated with hypertonic saline and Lasix.  She was eventually discharged on atenolol and Lasix.  She returned to the ED in late February 2018 with shortness of breath and lightheadedness.  She was orthostatic at the time, Lasix was discontinued.  Repeat echocardiogram obtained in February 2018 showed normal EF, grade 2 DD, mild to moderate TR, PA peak pressure 36 mmHg.   ? ?I previously saw the patient via virtual visit in October 2021, at which time she had a single episode of tachycardia palpitation for which she took extra dose of atenolol.  More recently, she was seen by Caron Presume on 01/15/2021 at which time she was doing well.  Prior to that, she did go to the ED in May 2022 for racing heartbeat, however by the time she arrived in the ED, EKG showed normal rhythm.  Since the last visit, patient went to the ED on 03/21/2021 with rapid heart  rate.  EKG on arrival showed atrial fibrillation with RVR with heart rate in the 140s.  She was given a single dose of metoprolol and her heart rate slowed down and converted spontaneously back to sinus rhythm.  Basic lab work was unremarkable.  Her CHA2DS-Vasc score was elevated, however the ED physician discussed the case with her son and they made the joint decision not to pursue anticoagulation at the present. ? ?Patient presents today for follow-up.  She has very good cardiac awareness of atrial fibrillation and duration of the A-fib was quite transient and self converted on rate control medication.  Her CHA2DS2-Vasc score is 4. With no medication, her annual stroke risk is 4.0%, bleeding risk is 0.6%.  By placing her on Eliquis 2.5 mg twice a day, her annual stroke risk come down to 1.7%, bleeding risk increased to 2.6%.  Given her transient nature of the atrial fibrillation which her son Dr. Jeffie Pollock suggested was due to stress from family member visit, we decided to hold off on starting her on Eliquis at this time.  She is aware of increased stroke risk.  If she has increased episode of A-fib in the future, I would have low threshold of starting her on 2.5 mg twice a day of Eliquis. ? ? ?Past Medical History:  ?Diagnosis Date  ? Acute upper respiratory infections of unspecified site 06/09/2011  ? Breast cancer ([place]) 12/11/2008  ? Fatigue 01/2012  ?  Hypercholesterolemia   ? on lipitor  ? Hypertension   ? Hyponatremia 01/2016  ? Memory loss   ? Mitral valve prolapse   ? Other abnormal blood chemistry 01/21/2010  ? Palpitations 08/05/2000  ? Right bundle branch block   ? Scoliosis   ? Senile osteoporosis 09/28/2001  ? ? ?Past Surgical History:  ?Procedure Laterality Date  ? BREAST LUMPECTOMY Right 12/27/2007  ? LAPAROSCOPIC LYSIS OF ADHESIONS  07/15/2006  ? [name]  ? OTHER SURGICAL HISTORY    ? TONSILLECTOMY    ? TUBAL LIGATION  1963  ? ? ?Current Medications: ?Current Meds  ?Medication Sig  ? atenolol (TENORMIN) 25 MG  tablet Take 1/4 tablet daily (Patient taking differently: Taking 6.'25mg'$  Daily capsule formulary)  ? Cholecalciferol (VITAMIN D3) 1000 units CAPS Take 1,000 Units by mouth daily.   ?  ? ?Allergies:   Patient has no known allergies.  ? ?Social History  ? ?Socioeconomic History  ? Marital status: Widowed  ?  Spouse name: Not on file  ? Number of children: 3  ? Years of education: Not on file  ? Highest education level: Not on file  ?Occupational History  ? Occupation: Retired Pharmacist, hospital  ?  Employer: RETIRED  ?Tobacco Use  ? Smoking status: Former  ?  Packs/day: 0.25  ?  Years: 10.00  ?  Pack years: 2.50  ?  Types: Cigarettes  ?  Quit date: 08/06/1954  ?  Years since quitting: 66.7  ? Smokeless tobacco: Never  ?Vaping Use  ? Vaping Use: Never used  ?Substance and Sexual Activity  ? Alcohol use: Yes  ?  Comment: Occasionally   ? Drug use: No  ? Sexual activity: Never  ?Other Topics Concern  ? Not on file  ?Social History Narrative  ? Lives at [facility] since 05/1998  ? Widowed  ? Living will  ? Former smoker, stopped 1956  ? Alcohol  Rare  ? Exercise - walk one hour daily 7 days a week,  3 days of stretching and strengthen, yoga 45 minutes 2 days a week  ? ?Social Determinants of Health  ? ?Financial Resource Strain: Not on file  ?Food Insecurity: Not on file  ?Transportation Needs: Not on file  ?Physical Activity: Not on file  ?Stress: Not on file  ?Social Connections: Not on file  ?  ? ?Family History: ?The patient's family history includes Alzheimer's disease in her sister; Cancer in her father; Stroke in her mother. ? ?ROS:   ?Please see the history of present illness.    ? All other systems reviewed and are negative. ? ?EKGs/Labs/Other Studies Reviewed:   ? ?The following studies were reviewed today: ? ?Echo 02/21/2016 ?- Left ventricle: The cavity size was normal. Wall thickness was  ?  normal. Features are consistent with a pseudonormal left  ?  ventricular filling pattern, with concomitant abnormal relaxation  ?  and  increased filling pressure (grade 2 diastolic dysfunction).  ?- Aortic valve: Mildly to moderately calcified annulus. There was  ?  mild regurgitation. Valve area (VTI): 1.19 cm^2. Valve area  ?  (Vmax): 1.19 cm^2. Valve area (Vmean): 1.29 cm^2.  ?- Tricuspid valve: There was mild-moderate regurgitation.  ?- Pulmonary arteries: Systolic pressure was mildly increased. PA  ?  peak pressure: 36 mm Hg (S).  ? ?EKG:  EKG is ordered today.  The ekg ordered today demonstrates normal sinus rhythm, right bundle branch block. ? ?Recent Labs: ?11/16/2020: ALT 9 ?03/21/2021: BUN 23; Creatinine, Ser 0.81;  Hemoglobin 14.5; Magnesium 2.1; Platelets 299; Potassium 3.7; Sodium 135  ?Recent Lipid Panel ?   ?Component Value Date/Time  ? CHOL 369 (A) 08/28/2020 0000  ? TRIG 170 (A) 08/28/2020 0000  ? HDL 51 08/28/2020 0000  ? CHOLHDL 3.5 01/27/2016 0241  ? VLDL 19 01/27/2016 0241  ? [place] 284 08/28/2020 0000  ? ? ? ?Risk Assessment/Calculations:   ? ?CHA2DS2-VASc Score = 4  ? This indicates a 4.8% annual risk of stroke. ?The patient's score is based upon: ?CHF History: 0 ?HTN History: 1 ?Diabetes History: 0 ?Stroke History: 0 ?Vascular Disease History: 0 ?Age Score: 2 ?Gender Score: 1 ?  ? ? ?    ? ?Physical Exam:   ? ?VS:  BP 132/68 (BP Location: Left Arm, Patient Position: Sitting, Cuff Size: Normal)   Pulse 64   Ht '5\' 1"'$  (1.549 m)   Wt 115 lb 12.8 oz (52.5 kg)   SpO2 97%   BMI 21.88 kg/m?    ? ?Wt Readings from Last 3 Encounters:  ?04/30/21 115 lb 12.8 oz (52.5 kg)  ?01/15/21 118 lb 12.8 oz (53.9 kg)  ?12/26/20 118 lb 9.6 oz (53.8 kg)  ?  ? ?GEN:  Well nourished, well developed in no acute distress ?HEENT: Normal ?NECK: No JVD; No carotid bruits ?LYMPHATICS: No lymphadenopathy ?CARDIAC: RRR, no murmurs, rubs, gallops ?RESPIRATORY:  Clear to auscultation without rales, wheezing or rhonchi  ?ABDOMEN: Soft, non-tender, non-distended ?MUSCULOSKELETAL:  No edema; No deformity  ?SKIN: Warm and dry ?NEUROLOGIC:  Alert and oriented x  3 ?PSYCHIATRIC:  Normal affect  ? ?ASSESSMENT:   ? ?1. PAF (paroxysmal atrial fibrillation) ([place])   ?2. Essential hypertension, benign   ?3. Hyperlipidemia LDL goal <100   ? ?PLAN:   ? ?In order of problems

## 2021-04-30 NOTE — Patient Instructions (Addendum)
Medication Instructions:  ?Your physician recommends that you continue on your current medications as directed. Please refer to the Current Medication list given to you today. ? ?*If you need a refill on your cardiac medications before your next appointment, please call your pharmacy* ? ?Lab Work: ?NONE ordered at this time of appointment  ? ?If you have labs (blood work) drawn today and your tests are completely normal, you will receive your results only by: ?MyChart Message (if you have MyChart) OR ?A paper copy in the mail ?If you have any lab test that is abnormal or we need to change your treatment, we will call you to review the results. ? ?Testing/Procedures: ?NONE ordered at this time of appointment  ? ?Follow-Up: ?At [facility], you and your health needs are our priority.  As part of our continuing mission to provide you with exceptional heart care, we have created designated Provider Care Teams.  These Care Teams include your primary Cardiologist (physician) and Advanced Practice Providers (APPs -  Physician Assistants and Nurse Practitioners) who all work together to provide you with the care you need, when you need it. ? ?Your next appointment:   ?3 month(s) ? ?The format for your next appointment:   ?In Person ? ?Provider:   ?[name], MD   ? ?Other Instructions ? ? ?Important Information About Sugar ? ? ? ? ? ? ?

## 2021-05-02 ENCOUNTER — Encounter: Payer: Self-pay | Admitting: Physician Assistant

## 2021-05-02 NOTE — Addendum Note (Signed)
Addended byEulas Post, Maeryn Mcgath on: 05/02/2021 11:25 PM ? ? Modules accepted: Level of Service ? ?

## 2021-06-09 ENCOUNTER — Other Ambulatory Visit: Payer: Self-pay | Admitting: Urology

## 2021-06-09 MED ORDER — ATENOLOL 25 MG PO TABS: ORAL_TABLET | ORAL | 0 refills | Status: DC

## 2021-06-18 DIAGNOSIS — I10 Essential (primary) hypertension: Secondary | ICD-10-CM | POA: Diagnosis not present

## 2021-06-18 DIAGNOSIS — E871 Hypo-osmolality and hyponatremia: Secondary | ICD-10-CM | POA: Diagnosis not present

## 2021-06-18 LAB — CBC AND DIFFERENTIAL
HCT: 41 (ref 36–46)
Hemoglobin: 14.1 (ref 12.0–16.0)
Platelets: 323 10*3/uL (ref 150–400)
WBC: 5.7

## 2021-06-18 LAB — BASIC METABOLIC PANEL
BUN: 18 (ref 4–21)
CO2: 23 — AB (ref 13–22)
Chloride: 100 (ref 99–108)
Creatinine: 0.7 (ref 0.5–1.1)
Glucose: 106
Potassium: 4.3 mEq/L (ref 3.5–5.1)
Sodium: 135 — AB (ref 137–147)

## 2021-06-18 LAB — HEPATIC FUNCTION PANEL
ALT: 8 U/L (ref 7–35)
AST: 15 (ref 13–35)
Alkaline Phosphatase: 70 (ref 25–125)
Bilirubin, Total: 0.3

## 2021-06-18 LAB — COMPREHENSIVE METABOLIC PANEL
Calcium: 9.6 (ref 8.7–10.7)
Globulin: 2

## 2021-06-18 LAB — CBC: RBC: 4.19 (ref 3.87–5.11)

## 2021-06-18 LAB — PROTEIN / CREATININE RATIO, URINE: Albumin, U: 4.3

## 2021-06-21 ENCOUNTER — Encounter: Payer: Self-pay | Admitting: Adult Health

## 2021-06-24 ENCOUNTER — Encounter: Payer: Medicare Other | Admitting: Adult Health

## 2021-06-24 NOTE — Progress Notes (Unsigned)
Location:  [facility]  POS: Clinic  Provider: Royal Hawthorn, ANP  Code Status:  Goals of Care:     06/21/2021    4:09 PM  Advanced Directives  Does Patient Have a Medical Advance Directive? Yes  Type of Advance Directive [place]  Does patient want to make changes to medical advance directive? No - Patient declined  Copy of [place] in Chart? Yes - validated most recent copy scanned in chart (See row information)     Chief Complaint  Patient presents with   Medical Management of Chronic Issues    Patient returns to the clinic for 6 month follow up and discuss labs, with MMSE.    Quality Metric Gaps    Discuss need for #5 covid vaccine    HPI: Patient is a 88 y.o. female seen today for medical management of chronic diseases.    PMH significant for hyponatremia HLD, MVP, memory loss, HTN, breast ca, osteoporosis,  Seen by Dr Martinique due to afib which is transient. Her CHA2DS2-Vasc score is 4.They decided to hold off on eliquis for now. She was in SR at the office visit 04/30/21 Seen in the ED for palpitations and found to be in afib 03/21/21 and converted back to SR with metoprolol.  Takes vit d, walks regularly  Tscore -2.5 2019, ordered DEXA last year not done yet.   Has memory loss CT of the head 07/12/20 showed no acute intracranial findings with Chronic microvascular ischemic change and cerebral volume loss. MMSE 28/30 2021. CIT score rising from 0 in 2020 now to 4 2022.       Past Medical History:  Diagnosis Date   Acute upper respiratory infections of unspecified site 06/09/2011   Breast cancer ([place]) 12/11/2008   Fatigue 01/2012   Hypercholesterolemia    on lipitor   Hypertension    Hyponatremia 01/2016   Memory loss    Mitral valve prolapse    Other abnormal blood chemistry 01/21/2010   Palpitations 08/05/2000   Right bundle branch block    Scoliosis    Senile osteoporosis 09/28/2001    Past Surgical History:   Procedure Laterality Date   BREAST LUMPECTOMY Right 12/27/2007   LAPAROSCOPIC LYSIS OF ADHESIONS  07/15/2006   [name]   OTHER SURGICAL HISTORY     TONSILLECTOMY     TUBAL LIGATION  1963    No Known Allergies  Outpatient Encounter Medications as of 06/24/2021  Medication Sig   atenolol (TENORMIN) 25 MG tablet 1/4 tablet (6.'25mg'$ ) po daily   atenolol (TENORMIN) 25 MG tablet Take 1/4 tablet daily (Patient taking differently: Taking 6.'25mg'$  Daily capsule formulary)   Cholecalciferol (VITAMIN D3) 1000 units CAPS Take 1,000 Units by mouth daily.    No facility-administered encounter medications on file as of 06/24/2021.    Review of Systems:  Review of Systems  Health Maintenance  Topic Date Due   COVID-19 Vaccine (5 - Booster for Moderna series) 12/14/2020   INFLUENZA VACCINE  08/06/2021   TETANUS/TDAP  05/14/2027   Pneumonia Vaccine 92+ Years old  Completed   DEXA SCAN  Completed   Zoster Vaccines- Shingrix  Completed   HPV VACCINES  Aged Out    Physical Exam: There were no vitals filed for this visit. There is no height or weight on file to calculate BMI. Physical Exam  Labs reviewed: Basic Metabolic Panel: Recent Labs    07/12/20 1045 08/28/20 0000 11/15/20 0000 11/16/20 0000 03/21/21 2220 06/18/21 0000  NA 134*   < >  --  137 135 135*  K 3.9   < >  --  4.7 3.7 4.3  CL 100   < >  --  99 99 100  CO2 26   < >  --  24* 27 23*  GLUCOSE 103*  --   --   --  162*  --   BUN 23   < >  --  '15 23 18  '$ CREATININE 0.77   < >  --  0.8 0.81 0.7  CALCIUM 9.7   < > 9.5  --  10.7* 9.6  MG  --   --   --   --  2.1  --    < > = values in this interval not displayed.   Liver Function Tests: Recent Labs    11/15/20 0000 11/16/20 0000 06/18/21 0000  AST  --  15 15  ALT  --  9 8  ALKPHOS  --  81 70  ALBUMIN 4.1  --   --    No results for input(s): "LIPASE", "AMYLASE" in the last 8760 hours. No results for input(s): "AMMONIA" in the last 8760 hours. CBC: Recent Labs     07/12/20 1045 08/28/20 0000 03/21/21 2220 06/18/21 0000  WBC 5.8 6.1 7.4 5.7  HGB 14.6 15.0 14.5 14.1  HCT 43.0 43 42.8 41  MCV 97.3  --  95.7  --   PLT 285 261 299 323   Lipid Panel: Recent Labs    08/28/20 0000  CHOL 369*  HDL 51  LDLCALC 284  TRIG 170*   Lab Results  Component Value Date   HGBA1C 5.9 (H) 01/23/2016    Procedures since last visit: No results found.  Assessment/Plan There are no diagnoses linked to this encounter.   Labs/tests ordered:  * No order type specified * Next appt:  Visit date not found   Total time **:  time greater than 50% of total time spent doing pt counseling and coordination of care

## 2021-07-14 ENCOUNTER — Other Ambulatory Visit: Payer: Self-pay

## 2021-07-14 ENCOUNTER — Encounter (HOSPITAL_COMMUNITY): Payer: Self-pay | Admitting: Emergency Medicine

## 2021-07-14 ENCOUNTER — Emergency Department (HOSPITAL_COMMUNITY): Payer: Medicare Other

## 2021-07-14 ENCOUNTER — Observation Stay (HOSPITAL_COMMUNITY)
Admission: EM | Admit: 2021-07-14 | Discharge: 2021-07-14 | Disposition: A | Discharge status: Home / Self Care | Payer: Medicare Other | Attending: Internal Medicine | Admitting: Internal Medicine

## 2021-07-14 DIAGNOSIS — Z87891 Personal history of nicotine dependence: Secondary | ICD-10-CM | POA: Insufficient documentation

## 2021-07-14 DIAGNOSIS — R778 Other specified abnormalities of plasma proteins: Secondary | ICD-10-CM | POA: Diagnosis not present

## 2021-07-14 DIAGNOSIS — Z79899 Other long term (current) drug therapy: Secondary | ICD-10-CM | POA: Diagnosis not present

## 2021-07-14 DIAGNOSIS — I480 Paroxysmal atrial fibrillation: Secondary | ICD-10-CM | POA: Diagnosis not present

## 2021-07-14 DIAGNOSIS — E7800 Pure hypercholesterolemia, unspecified: Secondary | ICD-10-CM

## 2021-07-14 DIAGNOSIS — Z743 Need for continuous supervision: Secondary | ICD-10-CM | POA: Diagnosis not present

## 2021-07-14 DIAGNOSIS — Z853 Personal history of malignant neoplasm of breast: Secondary | ICD-10-CM | POA: Insufficient documentation

## 2021-07-14 DIAGNOSIS — R404 Transient alteration of awareness: Secondary | ICD-10-CM | POA: Diagnosis not present

## 2021-07-14 DIAGNOSIS — G3184 Mild cognitive impairment, so stated: Secondary | ICD-10-CM | POA: Diagnosis not present

## 2021-07-14 DIAGNOSIS — J9811 Atelectasis: Secondary | ICD-10-CM | POA: Diagnosis not present

## 2021-07-14 DIAGNOSIS — R002 Palpitations: Principal | ICD-10-CM

## 2021-07-14 DIAGNOSIS — I10 Essential (primary) hypertension: Secondary | ICD-10-CM | POA: Diagnosis not present

## 2021-07-14 DIAGNOSIS — R6889 Other general symptoms and signs: Secondary | ICD-10-CM | POA: Diagnosis not present

## 2021-07-14 DIAGNOSIS — R079 Chest pain, unspecified: Secondary | ICD-10-CM | POA: Diagnosis not present

## 2021-07-14 DIAGNOSIS — E871 Hypo-osmolality and hyponatremia: Secondary | ICD-10-CM | POA: Diagnosis not present

## 2021-07-14 DIAGNOSIS — R1110 Vomiting, unspecified: Secondary | ICD-10-CM | POA: Diagnosis not present

## 2021-07-14 LAB — BASIC METABOLIC PANEL
Anion gap: 10 (ref 5–15)
BUN: 19 mg/dL (ref 8–23)
CO2: 23 mmol/L (ref 22–32)
Calcium: 9 mg/dL (ref 8.9–10.3)
Chloride: 100 mmol/L (ref 98–111)
Creatinine, Ser: 0.96 mg/dL (ref 0.44–1.00)
GFR, Estimated: 54 mL/min — ABNORMAL LOW (ref >60)
Glucose, Bld: 104 mg/dL — ABNORMAL HIGH (ref 70–99)
Potassium: 3.6 mmol/L (ref 3.5–5.1)
Sodium: 133 mmol/L — ABNORMAL LOW (ref 135–145)

## 2021-07-14 LAB — CBC WITH DIFFERENTIAL/PLATELET
Abs Immature Granulocytes: 0.07 10*3/uL (ref 0.00–0.07)
Basophils Absolute: 0.1 10*3/uL (ref 0.0–0.1)
Basophils Relative: 1 %
Eosinophils Absolute: 0.1 10*3/uL (ref 0.0–0.5)
Eosinophils Relative: 2 %
HCT: 39.7 % (ref 36.0–46.0)
Hemoglobin: 13.6 g/dL (ref 12.0–15.0)
Immature Granulocytes: 1 %
Lymphocytes Relative: 26 %
Lymphs Abs: 1.6 10*3/uL (ref 0.7–4.0)
MCH: 32.8 pg (ref 26.0–34.0)
MCHC: 34.3 g/dL (ref 30.0–36.0)
MCV: 95.7 fL (ref 80.0–100.0)
Monocytes Absolute: 0.6 10*3/uL (ref 0.1–1.0)
Monocytes Relative: 10 %
Neutro Abs: 3.6 10*3/uL (ref 1.7–7.7)
Neutrophils Relative %: 60 %
Platelets: 276 10*3/uL (ref 150–400)
RBC: 4.15 MIL/uL (ref 3.87–5.11)
RDW: 13.6 % (ref 11.5–15.5)
WBC: 6 10*3/uL (ref 4.0–10.5)
nRBC: 0 % (ref 0.0–0.2)

## 2021-07-14 LAB — TROPONIN I (HIGH SENSITIVITY)
Troponin I (High Sensitivity): 21 ng/L — ABNORMAL HIGH (ref <18)
Troponin I (High Sensitivity): 33 ng/L — ABNORMAL HIGH (ref <18)
Troponin I (High Sensitivity): 53 ng/L — ABNORMAL HIGH (ref <18)
Troponin I (High Sensitivity): 63 ng/L — ABNORMAL HIGH (ref <18)

## 2021-07-14 MED ORDER — ATENOLOL 25 MG PO TABS: ORAL_TABLET | ORAL | 3 refills | Status: DC

## 2021-07-14 MED ORDER — NONFORMULARY OR COMPOUNDED ITEM
6.2500 mg | Freq: Every day | Status: DC
  Administered 2021-07-14: 6.25 mg via ORAL
  Filled 2021-07-14 (×2): qty 1

## 2021-07-14 MED ORDER — ATENOLOL 25 MG PO TABS: ORAL_TABLET | ORAL | 3 refills | Status: AC

## 2021-07-14 MED ORDER — ASPIRIN 325 MG PO TBEC
325.0000 mg | DELAYED_RELEASE_TABLET | Freq: Every day | ORAL | Status: DC
  Administered 2021-07-14: 325 mg via ORAL
  Filled 2021-07-14: qty 1

## 2021-07-14 MED ORDER — ENOXAPARIN SODIUM 30 MG/0.3ML IJ SOSY
30.0000 mg | PREFILLED_SYRINGE | INTRAMUSCULAR | Status: DC
  Administered 2021-07-14: 30 mg via SUBCUTANEOUS
  Filled 2021-07-14: qty 0.3

## 2021-07-14 MED ORDER — ACETAMINOPHEN 325 MG PO TABS: 650.0000 mg | ORAL_TABLET | ORAL | Status: DC | PRN

## 2021-07-14 MED ORDER — ONDANSETRON HCL 4 MG/2ML IJ SOLN: 4.0000 mg | Freq: Four times a day (QID) | INTRAMUSCULAR | Status: DC | PRN

## 2021-07-14 MED ORDER — IOHEXOL 350 MG/ML SOLN
75.0000 mL | Freq: Once | INTRAVENOUS | Status: AC | PRN
  Administered 2021-07-14: 75 mL via INTRAVENOUS

## 2021-07-14 MED ORDER — NON FORMULARY: 6.2500 mg | Freq: Every day | Status: DC

## 2021-07-14 NOTE — Assessment & Plan Note (Signed)
Med rec pending. Doesn't look like she takes statin anymore though ("diet controlled" per April cards note).

## 2021-07-14 NOTE — ED Notes (Signed)
Pt dressed and states she is ready to go. RN explained to pt that she will need to wait for [facility] to take her back to [place]. Pt states she is disappointed and prefers to not be hooked back up to monitor at this time.

## 2021-07-14 NOTE — Assessment & Plan Note (Addendum)
Looks like she has MCI at baseline per Geriatrics note from Dec 2022.

## 2021-07-14 NOTE — ED Provider Notes (Signed)
[facility] EMERGENCY DEPARTMENT Provider Note   CSN: [id number] Arrival date & time: 07/14/21  0214     History  Chief Complaint  Patient presents with   Palpitations    Cynthia Watkins is a 88 y.o. female.  The history is provided by the patient and the EMS personnel.  Palpitations Palpitations quality:  Fast Onset quality:  Sudden Timing:  Constant Progression:  Resolved Chronicity:  New Relieved by:  Nothing Worsened by:  Nothing Ineffective treatments:  None tried Associated symptoms: no vomiting   Risk factors: no diabetes mellitus        Home Medications Prior to Admission medications   Medication Sig Start Date End Date Taking? Authorizing Provider  atenolol (TENORMIN) 25 MG tablet 1/4 tablet (6.'25mg'$ ) po daily 06/09/21   Irine Seal, MD  atenolol (TENORMIN) 25 MG tablet Take 1/4 tablet daily Patient taking differently: Taking 6.'25mg'$  Daily capsule formulary 11/25/18   Martinique, Peter M, MD  Cholecalciferol (VITAMIN D3) 1000 units CAPS Take 1,000 Units by mouth daily.     [provider]      Allergies    Patient has no known allergies.    Review of Systems   Review of Systems  Constitutional:  Negative for fever.  HENT:  Negative for facial swelling.   Eyes:  Negative for redness.  Cardiovascular:  Positive for palpitations.  Gastrointestinal:  Negative for vomiting.  Neurological:  Negative for facial asymmetry.  All other systems reviewed and are negative.   Physical Exam Updated Vital Signs BP 122/69   Pulse 63   Temp 97.8 F (36.6 C) (Oral)   Resp 16   SpO2 94%  Physical Exam Vitals and nursing note reviewed.  Constitutional:      General: She is not in acute distress.    Appearance: Normal appearance. She is well-developed. She is not diaphoretic.  HENT:     Head: Normocephalic and atraumatic.     Nose: Nose normal.  Eyes:     Pupils: Pupils are equal, round, and reactive to light.  Cardiovascular:     Rate  and Rhythm: Normal rate and regular rhythm.     Pulses: Normal pulses.     Heart sounds: Normal heart sounds.  Pulmonary:     Effort: Pulmonary effort is normal. No respiratory distress.     Breath sounds: Normal breath sounds.  Abdominal:     General: Bowel sounds are normal. There is no distension.     Palpations: Abdomen is soft.     Tenderness: There is no abdominal tenderness. There is no guarding or rebound.  Genitourinary:    Vagina: No vaginal discharge.  Musculoskeletal:        General: Normal range of motion.     Cervical back: Neck supple.  Skin:    General: Skin is warm and dry.     Capillary Refill: Capillary refill takes less than 2 seconds.     Findings: No erythema or rash.  Neurological:     General: No focal deficit present.     Mental Status: She is alert.     Deep Tendon Reflexes: Reflexes normal.  Psychiatric:        Mood and Affect: Mood normal.     ED Results / Procedures / Treatments   Labs (all labs ordered are listed, but only abnormal results are displayed) Results for orders placed or performed during the hospital encounter of 07/14/21  CBC with Differential  Result Value Ref Range  WBC 6.0 4.0 - 10.5 K/uL   RBC 4.15 3.87 - 5.11 MIL/uL   Hemoglobin 13.6 12.0 - 15.0 g/dL   HCT 39.7 36.0 - 46.0 %   MCV 95.7 80.0 - 100.0 fL   MCH 32.8 26.0 - 34.0 pg   MCHC 34.3 30.0 - 36.0 g/dL   RDW 13.6 11.5 - 15.5 %   Platelets 276 150 - 400 K/uL   nRBC 0.0 0.0 - 0.2 %   Neutrophils Relative % 60 %   Neutro Abs 3.6 1.7 - 7.7 K/uL   Lymphocytes Relative 26 %   Lymphs Abs 1.6 0.7 - 4.0 K/uL   Monocytes Relative 10 %   Monocytes Absolute 0.6 0.1 - 1.0 K/uL   Eosinophils Relative 2 %   Eosinophils Absolute 0.1 0.0 - 0.5 K/uL   Basophils Relative 1 %   Basophils Absolute 0.1 0.0 - 0.1 K/uL   Immature Granulocytes 1 %   Abs Immature Granulocytes 0.07 0.00 - 0.07 K/uL  Basic metabolic panel  Result Value Ref Range   Sodium 133 (L) 135 - 145 mmol/L    Potassium 3.6 3.5 - 5.1 mmol/L   Chloride 100 98 - 111 mmol/L   CO2 23 22 - 32 mmol/L   Glucose, Bld 104 (H) 70 - 99 mg/dL   BUN 19 8 - 23 mg/dL   Creatinine, Ser 0.96 0.44 - 1.00 mg/dL   Calcium 9.0 8.9 - 10.3 mg/dL   GFR, Estimated 54 (L) >60 mL/min   Anion gap 10 5 - 15  Troponin I (High Sensitivity)  Result Value Ref Range   Troponin I (High Sensitivity) 21 (H) <18 ng/L   CT Angio Chest PE W and/or Wo Contrast  Result Date: 07/14/2021 CLINICAL DATA:  Chest pain or shortness of breath, pleurisy or effusion suspected EXAM: CT ANGIOGRAPHY CHEST WITH CONTRAST TECHNIQUE: Multidetector CT imaging of the chest was performed using the standard protocol during bolus administration of intravenous contrast. Multiplanar CT image reconstructions and MIPs were obtained to evaluate the vascular anatomy. RADIATION DOSE REDUCTION: This exam was performed according to the departmental dose-optimization program which includes automated exposure control, adjustment of the mA and/or kV according to patient size and/or use of iterative reconstruction technique. CONTRAST:  52m OMNIPAQUE IOHEXOL 350 MG/ML SOLN COMPARISON:  01/25/2016 chest CT FINDINGS: Cardiovascular: Satisfactory opacification of the pulmonary arteries to the segmental level. No evidence of pulmonary embolism. Normal heart size. No pericardial effusion. Atheromatous calcification. Mediastinum/Nodes: Negative for adenopathy or mass Lungs/Pleura: Dependent atelectasis, subsegmental. There is no edema, consolidation, effusion, or pneumothorax. Upper Abdomen: No acute finding.  Granulomatous calcifications Musculoskeletal: No acute finding Review of the MIP images confirms the above findings. IMPRESSION: 1. Negative for pulmonary embolism. 2. Low volume chest with atelectasis. Electronically Signed   By: JJorje GuildM.D.   On: 07/14/2021 04:26   DG Chest Portable 1 View  Result Date: 07/14/2021 CLINICAL DATA:  Chest pain and cardiac palpitations  EXAM: PORTABLE CHEST 1 VIEW COMPARISON:  05/21/2020 FINDINGS: Cardiac shadow is mildly enlarged but stable. Aortic calcifications are seen. Lungs are well aerated bilaterally. Minimal left basilar atelectasis is noted. No bony abnormality is seen. IMPRESSION: Minimal left basilar atelectasis. Electronically Signed   By: MInez CatalinaM.D.   On: 07/14/2021 02:56     EKG Interpretation  Date/Time:  Sunday July 14 2021 02:25:08 EDT Ventricular Rate:  72 PR Interval:  193 QRS Duration: 128 QT Interval:  375 QTC Calculation: 411 R Axis:   -10 Text  Interpretation: Sinus rhythm Ventricular premature complex Right bundle branch block Confirmed by Randal Buba, Taia Bramlett ([id number]) on 07/14/2021 2:30:17 AM  Radiology CT Angio Chest PE W and/or Wo Contrast  Result Date: 07/14/2021 CLINICAL DATA:  Chest pain or shortness of breath, pleurisy or effusion suspected EXAM: CT ANGIOGRAPHY CHEST WITH CONTRAST TECHNIQUE: Multidetector CT imaging of the chest was performed using the standard protocol during bolus administration of intravenous contrast. Multiplanar CT image reconstructions and MIPs were obtained to evaluate the vascular anatomy. RADIATION DOSE REDUCTION: This exam was performed according to the departmental dose-optimization program which includes automated exposure control, adjustment of the mA and/or kV according to patient size and/or use of iterative reconstruction technique. CONTRAST:  13m OMNIPAQUE IOHEXOL 350 MG/ML SOLN COMPARISON:  01/25/2016 chest CT FINDINGS: Cardiovascular: Satisfactory opacification of the pulmonary arteries to the segmental level. No evidence of pulmonary embolism. Normal heart size. No pericardial effusion. Atheromatous calcification. Mediastinum/Nodes: Negative for adenopathy or mass Lungs/Pleura: Dependent atelectasis, subsegmental. There is no edema, consolidation, effusion, or pneumothorax. Upper Abdomen: No acute finding.  Granulomatous calcifications Musculoskeletal: No acute  finding Review of the MIP images confirms the above findings. IMPRESSION: 1. Negative for pulmonary embolism. 2. Low volume chest with atelectasis. Electronically Signed   By: JJorje GuildM.D.   On: 07/14/2021 04:26   DG Chest Portable 1 View  Result Date: 07/14/2021 CLINICAL DATA:  Chest pain and cardiac palpitations EXAM: PORTABLE CHEST 1 VIEW COMPARISON:  05/21/2020 FINDINGS: Cardiac shadow is mildly enlarged but stable. Aortic calcifications are seen. Lungs are well aerated bilaterally. Minimal left basilar atelectasis is noted. No bony abnormality is seen. IMPRESSION: Minimal left basilar atelectasis. Electronically Signed   By: MInez CatalinaM.D.   On: 07/14/2021 02:56    Procedures Procedures    Medications Ordered in ED Medications  aspirin EC tablet 325 mg (has no administration in time range)  iohexol (OMNIPAQUE) 350 MG/ML injection 75 mL (75 mLs Intravenous Contrast Given 07/14/21 0414)    ED Course/ Medical Decision Making/ A&P                           Medical Decision Making Patient with a history of atrial fibrillation who usually resolves with a dose of lopressor presents with palpitations prior to arrival   Amount and/or Complexity of Data Reviewed Independent Historian: EMS    Details: see above External Data Reviewed: notes.    Details: previous notes reviewed Labs: ordered.    Details: all labs reviewed:  Sodium slightly low 133, glucose slight elevated 104.  Normal potassium.  Normal white count and hemoglobin and platelet count.  Troponin slightly elevated at 21. Radiology: ordered.    Details: Case d/w Dr. CRenella Cunasof cardiology,  NSR would not recommend Lopressor  522 Case d/w Dr. GAlcario Droughtwho will admit the patient ECG/medicine tests: ordered and independent interpretation performed. Decision-making details documented in ED Course.  Risk OTC drugs. Prescription drug management. Decision regarding hospitalization. Risk Details: Patient has been ruled out  for PE in the ED.  First troponin is above the top limit of normal.  Will start aspirin and admit to medicine for ongoing care.      Final Clinical Impression(s) / ED Diagnoses Final diagnoses:  None   The patient appears reasonably stabilized for admission considering the current resources, flow, and capabilities available in the ED at this time, and I doubt any other [facility]iring further screening and/or treatment in the ED prior to admission.  Rx / DC Orders ED Discharge Orders     None         Nasra Counce, MD 07/14/21 9233

## 2021-07-14 NOTE — ED Notes (Signed)
RN updated son, Nissley via phone

## 2021-07-14 NOTE — ED Notes (Signed)
Patient denies pain and is resting comfortably.  

## 2021-07-14 NOTE — ED Notes (Signed)
Pt ambulatory to bathroom

## 2021-07-14 NOTE — Discharge Summary (Signed)
Physician Discharge Summary  Cynthia Watkins FAO:[id number] DOB: 1924/12/14 DOA: 07/14/2021  PCP: Royal Hawthorn, NP  Admit date: 07/14/2021 Discharge date: 07/14/2021 Discharging to: [facility] independent living Recommendations for Outpatient Follow-up:  None  Consults:  EP Procedures:  None   Discharge Diagnoses:   Principal Problem:   Palpitations Active Problems:   PAF (paroxysmal atrial fibrillation) (HCC)   Hypercholesterolemia   Essential hypertension, benign   MCI (mild cognitive impairment)     Hospital Course:  88 year old female with paroxysmal atrial fibrillation, hyperlipidemia and hypertension who comes to the hospital from independent living for complaint of palpitations.  When she arrived to the ED she was in normal sinus rhythm.  Troponin was noted to be 21.  The patient was recommended for admission.  Principal Problem:   Palpitations history of paroxysmal atrial fibrillation -Resolved prior to coming to the ED - Cardiology consult requested - Patient evaluated by Dr. Curt Bears who stated that the patient could increase her atenolol from 6.25-12.5-I have spoken with the patient and her son and they are in agreement with this-  Active Problems: Elevated troponin -.Troponin 21> 33> 53> 63 - This is likely as a result of the above-mentioned palpitations - No further work-up    Essential hypertension, benign -Most recent BPs 142/80 and 147/75-there is room to double her atenolol    MCI (mild cognitive impairment) -On my exam she does not appear to have any cognitive impairment   Body mass index is 21.73 kg/m. Nutrition Status:          Discharge Instructions  Discharge Instructions     Diet - low sodium heart healthy   Complete by: As directed    Increase activity slowly   Complete by: As directed       Allergies as of 07/14/2021   No Known Allergies      Medication List     TAKE these medications    atenolol 25 MG tablet Commonly  known as: TENORMIN Take 1/2 tab daily What changed: additional instructions   Vitamin D3 25 MCG (1000 UT) Caps Take 1,000 Units by mouth daily.            The results of significant diagnostics from this hospitalization (including imaging, microbiology, ancillary and laboratory) are listed below for reference.    CT Angio Chest PE W and/or Wo Contrast  Result Date: 07/14/2021 CLINICAL DATA:  Chest pain or shortness of breath, pleurisy or effusion suspected EXAM: CT ANGIOGRAPHY CHEST WITH CONTRAST TECHNIQUE: Multidetector CT imaging of the chest was performed using the standard protocol during bolus administration of intravenous contrast. Multiplanar CT image reconstructions and MIPs were obtained to evaluate the vascular anatomy. RADIATION DOSE REDUCTION: This exam was performed according to the departmental dose-optimization program which includes automated exposure control, adjustment of the mA and/or kV according to patient size and/or use of iterative reconstruction technique. CONTRAST:  30m OMNIPAQUE IOHEXOL 350 MG/ML SOLN COMPARISON:  01/25/2016 chest CT FINDINGS: Cardiovascular: Satisfactory opacification of the pulmonary arteries to the segmental level. No evidence of pulmonary embolism. Normal heart size. No pericardial effusion. Atheromatous calcification. Mediastinum/Nodes: Negative for adenopathy or mass Lungs/Pleura: Dependent atelectasis, subsegmental. There is no edema, consolidation, effusion, or pneumothorax. Upper Abdomen: No acute finding.  Granulomatous calcifications Musculoskeletal: No acute finding Review of the MIP images confirms the above findings. IMPRESSION: 1. Negative for pulmonary embolism. 2. Low volume chest with atelectasis. Electronically Signed   By: JJorje GuildM.D.   On: 07/14/2021 04:26   DG Chest  Portable 1 View  Result Date: 07/14/2021 CLINICAL DATA:  Chest pain and cardiac palpitations EXAM: PORTABLE CHEST 1 VIEW COMPARISON:  05/21/2020 FINDINGS:  Cardiac shadow is mildly enlarged but stable. Aortic calcifications are seen. Lungs are well aerated bilaterally. Minimal left basilar atelectasis is noted. No bony abnormality is seen. IMPRESSION: Minimal left basilar atelectasis. Electronically Signed   By: Inez Catalina M.D.   On: 07/14/2021 02:56   Labs:   Basic Metabolic Panel: Recent Labs  Lab 07/14/21 0242  NA 133*  K 3.6  CL 100  CO2 23  GLUCOSE 104*  BUN 19  CREATININE 0.96  CALCIUM 9.0     CBC: Recent Labs  Lab 07/14/21 0242  WBC 6.0  NEUTROABS 3.6  HGB 13.6  HCT 39.7  MCV 95.7  PLT 276         SIGNED:   Debbe Odea, MD  [facility] 07/14/2021, 3:23 PM

## 2021-07-14 NOTE — ED Notes (Signed)
Son Dr Irine Seal [phone] would like an update immediately

## 2021-07-14 NOTE — ED Notes (Signed)
Admitting provider at bedside.

## 2021-07-14 NOTE — ED Notes (Signed)
Patient transported to CT 

## 2021-07-14 NOTE — ED Notes (Signed)
RN called PTAR.

## 2021-07-14 NOTE — Assessment & Plan Note (Addendum)
See above NSR at this time. 1. Continue tenormin very low dose 2. CHADS VASC = 4; however, pt felt to be high risk for [facility].  Cards felt that risks of starting AC likely outweighed benefits of stroke prevention as per their most recent office note in April of this year (see office note for details) and so anti coagulation not started at that time. 1. Will continue to not start [facility] / defer to cardiology.

## 2021-07-14 NOTE — Assessment & Plan Note (Signed)
Continue Very low dose atenolol

## 2021-07-14 NOTE — Assessment & Plan Note (Addendum)
Asymptomatic at this time. Suspect patient was having A.Fib RVR PTA, though now clearly converted to NSR with HR 63. Looks like every time shes had palpitations in past she had a.fib RVR as cause in setting of known PAF. Trop slightly elevated at 21.  Repeat 33 1. Tele monitor 2. Obs for now due to slight trop elevation and rising trop. 3. Resume home 1/4th tab of tenormin daily (6.25 mg) 4. Trend troponin. 5. Probably okay to go home after trop starts trending down if she continues to be in NSR and completely asymptomatic as she is now.

## 2021-07-14 NOTE — ED Notes (Signed)
Attempted report to [facility] again. Made contact with Bruce at front desk but multiple unsuccessful attempts to contact RN.

## 2021-07-14 NOTE — ED Notes (Signed)
RN attempted to call [facility]. [facility] has voicemail that they will not return call until Monday.

## 2021-07-14 NOTE — H&P (Signed)
History and Physical    Patient: Cynthia Watkins GMW:[id number] DOB: 1924/05/15 DOA: 07/14/2021 DOS: the patient was seen and examined on 07/14/2021 PCP: Royal Hawthorn, NP  Patient coming from:  [facility]  Chief Complaint:  Chief Complaint  Patient presents with   Palpitations   HPI: Cynthia Watkins is a 88 y.o. female with medical history significant of PAF, HTN, HLD, MCI.  Pt with h/o episodic palpitations in past.  These have been associated with episodes of A.Fib RVR.  She is currently on very low dose atenolol (6.'25mg'$  PO daily) as only med for this.  Cards, in discussion with her POA (her son who is also an urologist here in the [facility] system), feels that she is not a great candidate to start [facility] on at this time (see April cards office note).  Today she reportedly had palpitations at her facility.  Not clear time of onset nor duration.  Not clear if facility gave her an extra dose of PO atenolol or not.  But EMS was called and patient transported to ED.  By the time of her arrival to ED, her palpitations have completely resolved, she is completely asymptomatic, and she is in NSR with a rate of 63.  Unfortunately due to her MCI, shes not able to tell me much at all about the events of earlier this evening.  She is able to tell me how she is feeling right now ("fine, and can I go home now?"), and that's about it.  This is her baseline mental status as per her Son.   Review of Systems: As mentioned in the history of present illness. All other systems reviewed and are negative. Past Medical History:  Diagnosis Date   Acute upper respiratory infections of unspecified site 06/09/2011   Breast cancer ([place]) 12/11/2008   Fatigue 01/2012   Hypercholesterolemia    on lipitor   Hypertension    Hyponatremia 01/2016   Memory loss    Mitral valve prolapse    Other abnormal blood chemistry 01/21/2010   Palpitations 08/05/2000   Right bundle branch block    Scoliosis    Senile osteoporosis 09/28/2001    Past Surgical History:  Procedure Laterality Date   BREAST LUMPECTOMY Right 12/27/2007   LAPAROSCOPIC LYSIS OF ADHESIONS  07/15/2006   [name]   OTHER SURGICAL HISTORY     TONSILLECTOMY     TUBAL LIGATION  1963   Social History:  reports that she quit smoking about 66 years ago. Her smoking use included cigarettes. She has a 2.50 pack-year smoking history. She has never used smokeless tobacco. She reports current alcohol use. She reports that she does not use drugs.  No Known Allergies  Family History  Problem Relation Age of Onset   Stroke Mother    Cancer Father        colon   Alzheimer's disease Sister     Prior to Admission medications   Medication Sig Start Date End Date Taking? Authorizing Provider  atenolol (TENORMIN) 25 MG tablet 1/4 tablet (6.'25mg'$ ) po daily 06/09/21   Irine Seal, MD  atenolol (TENORMIN) 25 MG tablet Take 1/4 tablet daily Patient taking differently: Taking 6.'25mg'$  Daily capsule formulary 11/25/18   Martinique, Peter M, MD  Cholecalciferol (VITAMIN D3) 1000 units CAPS Take 1,000 Units by mouth daily.     [provider]    Physical Exam: Vitals:   07/14/21 0300 07/14/21 0315 07/14/21 0500 07/14/21 0600  BP: 117/75 122/69 129/60   Pulse: 64  63 (!) 58   Resp: '20 16 17   '$ Temp:      TempSrc:      SpO2: 94% 94% 95%   Weight:    52.2 kg  Height:    '5\' 1"'$  (1.549 m)   Constitutional: NAD, calm, comfortable Eyes: PERRL, lids and conjunctivae normal ENMT: Mucous membranes are moist. Posterior pharynx clear of any exudate or lesions.Normal dentition.  Neck: normal, supple, no masses, no thyromegaly Respiratory: clear to auscultation bilaterally, no wheezing, no crackles. Normal respiratory effort. No accessory muscle use.  Cardiovascular: Regular rate and rhythm, no murmurs / rubs / gallops. No extremity edema. 2+ pedal pulses. No carotid bruits.  Abdomen: no tenderness, no masses palpated. No hepatosplenomegaly. Bowel sounds positive.   Musculoskeletal: no clubbing / cyanosis. No joint deformity upper and lower extremities. Good ROM, no contractures. Normal muscle tone.  Skin: no rashes, lesions, ulcers. No induration Neurologic: CN 2-12 grossly intact. Sensation intact, DTR normal. Strength 5/5 in all 4.  Psychiatric: Normal judgment and insight. Oriented to self and location.  Doesn't have great memory of previous events though.   Data Reviewed:    EKG in ED shows NSR at rate of 63.  Trops 21 and 33 on repeat.  CBC and CMP not very impressive.  Assessment and Plan: * Palpitations Asymptomatic at this time. Question if patient was having A.Fib RVR PTA, though now clearly converted to NSR with HR 63. Looks like every time shes had palpitations in past she had a.fib RVR as cause in setting of known PAF. Trop slightly elevated at 21.  Repeat 33 Tele monitor Obs for now due to slight trop elevation and rising trop. Resume home 1/4th tab of tenormin daily (6.25 mg) Trend troponin. Probably okay to go home after trop starts trending down if she continues to be in NSR and completely asymptomatic as she is now.  PAF (paroxysmal atrial fibrillation) ([place]) See above NSR at this time. Continue tenormin very low dose CHADS VASC = 4; however, pt felt to be high risk for [facility].  Cards felt that risks of starting AC likely outweighed benefits of stroke prevention as per their most recent office note in April of this year (see office note for details) and so anti coagulation not started at that time. Will continue to not start [facility] / defer to cardiology.  MCI (mild cognitive impairment) Looks like she has MCI at baseline per Geriatrics note from Dec 2022.  Essential hypertension, benign Continue Very low dose atenolol  Hypercholesterolemia Med rec pending. Doesn't look like she takes statin anymore though ("diet controlled" per April cards note).      Advance Care Planning:   Code Status: DNR Confirmed with Dr. Jeffie Pollock  (her son the Urologist), patient is DNR  Consults: None  Family Communication: Spoke with son over phone ([phone])  Severity of Illness: The appropriate patient status for this patient is OBSERVATION. Observation status is judged to be reasonable and necessary in order to provide the required intensity of service to ensure the patient's safety. The patient's presenting symptoms, physical exam findings, and initial radiographic and laboratory data in the context of their medical condition is felt to place them at decreased risk for further clinical deterioration. Furthermore, it is anticipated that the patient will be medically stable for discharge from the hospital within 2 midnights of admission.   Author: Etta Quill., DO 07/14/2021 6:24 AM  For on call review [email or web address].

## 2021-07-14 NOTE — ED Triage Notes (Signed)
Pt brought to ED by [facility] from [facility] for evaluation of palpitations.Has hx of same. Denies any chest pain at this time but endorses feeling slightly dizzy before onset of symptoms.   EMS Vitals BP 121/73 Hr 72 RR 16 SPO2 98% RA

## 2021-07-14 NOTE — ED Notes (Signed)
Pt ate lunch tray

## 2021-07-14 NOTE — ED Notes (Signed)
RN called PTAR, pt is 2nd on list

## 2021-07-14 NOTE — Consult Note (Signed)
Cardiology Consultation:   Patient ID: Cynthia Watkins MRN: [id number]; DOB: 11/30/1924  Admit date: 07/14/2021 Date of Consult: 07/14/2021  PCP:  Cynthia Hawthorn, NP   [facility] Providers Cardiologist:  [name], MD        Patient Profile:   Cynthia Watkins is a 88 y.o. female with a hx of hypertension, hyperlipidemia, atrial fibrillation who is being seen 07/14/2021 for the evaluation of palpitations at the request of Siama Rizwan.  History of Present Illness:   [name] has a past history as above.  She had been having episodic palpitations.  These have been associated with episodes of atrial fibrillation.  She has been on low-dose atenolol.  She had previous discussions about anticoagulation but it was thought that due to fall risk, she did not be anticoagulated.  She began having palpitations at her facility.  There was no clear time of onset or duration.  EMS was called and she was transported to the emergency room.  On arrival to emergency room, palpitations resolved.  She is currently asymptomatic.  She tells me that she had an episode of syncope as the cause of her presentation.  She states that she passed out the night before her admission, after getting up from bed.  Based on her mild cognitive impairment, though, he was unclear as to whether or not this actually occurred based on reports of prior notes.  Currently she feels well and is without complaint.   Past Medical History:  Diagnosis Date   Acute upper respiratory infections of unspecified site 06/09/2011   Breast cancer ([place]) 12/11/2008   Fatigue 01/2012   Hypercholesterolemia    on lipitor   Hypertension    Hyponatremia 01/2016   Memory loss    Mitral valve prolapse    Other abnormal blood chemistry 01/21/2010   Palpitations 08/05/2000   Right bundle branch block    Scoliosis    Senile osteoporosis 09/28/2001    Past Surgical History:  Procedure Laterality Date   BREAST LUMPECTOMY Right 12/27/2007    LAPAROSCOPIC LYSIS OF ADHESIONS  07/15/2006   [name]   OTHER SURGICAL HISTORY     TONSILLECTOMY     TUBAL LIGATION  1963     Home Medications:  Prior to Admission medications   Medication Sig Start Date End Date Taking? Authorizing Provider  atenolol (TENORMIN) 25 MG tablet Take 1/4 tablet daily Patient taking differently: Take 6.25 mg by mouth daily. Taking 6.'25mg'$  Daily capsule formulary 11/25/18  Yes Martinique, Peter M, MD  Cholecalciferol (VITAMIN D3) 1000 units CAPS Take 1,000 Units by mouth daily.    Yes [provider]    Inpatient Medications: Scheduled Meds:  aspirin EC  325 mg Oral Daily   ATENOLOL 6.25 mg  (use one-quarter of atenolol '25mg'$  tab)  6.25 mg Oral Daily   enoxaparin (LOVENOX) injection  30 mg Subcutaneous Q24H   Continuous Infusions:  PRN Meds: acetaminophen, ondansetron (ZOFRAN) IV  Allergies:   No Known Allergies  Social History:   Social History   Socioeconomic History   Marital status: Widowed    Spouse name: Not on file   Number of children: 3   Years of education: Not on file   Highest education level: Not on file  Occupational History   Occupation: Retired Product manager: RETIRED  Tobacco Use   Smoking status: Former    Packs/day: 0.25    Years: 10.00    Total pack years: 2.50  Types: Cigarettes    Quit date: 08/06/1954    Years since quitting: 66.9   Smokeless tobacco: Never  Vaping Use   Vaping Use: Never used  Substance and Sexual Activity   Alcohol use: Yes    Comment: Occasionally    Drug use: No   Sexual activity: Never  Other Topics Concern   Not on file  Social History Narrative   Lives at [place] since 05/1998   Widowed   Living [place]   Former smoker, stopped 1956   Alcohol  Rare   Exercise - walk one hour daily 7 days a week,  3 days of stretching and strengthen, yoga 45 minutes 2 days a week   Social Determinants of Health   Financial Resource Strain: Low Risk  (09/15/2017)   Overall Financial  Resource Strain (CARDIA)    Difficulty of Paying Living Expenses: Not hard at all  Food Insecurity: No Food Insecurity (09/15/2017)   Hunger Vital Sign    Worried About Running Out of Food in the Last Year: Never true    [place] in the Last Year: Never true  Transportation Needs: No Transportation Needs (09/15/2017)   PRAPARE - Hydrologist (Medical): No    Lack of Transportation (Non-Medical): No  Physical Activity: Sufficiently Active (09/15/2017)   Exercise Vital Sign    Days of Exercise per Week: 7 days    Minutes of Exercise per Session: 120 min  Stress: No Stress Concern Present (09/15/2017)   [place]    Feeling of Stress : Not at all  Social Connections: Moderately Isolated (09/15/2017)   Social Connection and Isolation Panel [NHANES]    Frequency of Communication with Friends and Family: More than three times a week    Frequency of Social Gatherings with Friends and Family: More than three times a week    Attends Religious Services: Never    Marine scientist or Organizations: No    Attends Archivist Meetings: Never    Marital Status: Widowed  Intimate Partner Violence: Not At Risk (09/15/2017)   Humiliation, Afraid, Rape, and Kick questionnaire    Fear of Current or Ex-Partner: No    Emotionally Abused: No    Physically Abused: No    Sexually Abused: No    Family History:    Family History  Problem Relation Age of Onset   Stroke Mother    Cancer Father        colon   Alzheimer's disease Sister      ROS:  Please see the history of present illness.   All other ROS reviewed and negative.     Physical Exam/Data:   Vitals:   07/14/21 0800 07/14/21 0900 07/14/21 1145 07/14/21 1200  BP: 121/67 (!) 145/74 (!) 143/82 (!) 141/74  Pulse: (!) 58 62 69 63  Resp: 13 20 (!) 23 17  Temp:      TempSrc:      SpO2: 95% 95% 95% 96%  Weight:      Height:        No intake or output data in the 24 hours ending 07/14/21 1250    07/14/2021    6:00 AM 04/30/2021    3:25 PM 01/15/2021   11:04 AM  Last 3 Weights  Weight (lbs) 115 lb 115 lb 12.8 oz 118 lb 12.8 oz  Weight (kg) 52.164 kg 52.527 kg 53.887 kg  Body mass index is 21.73 kg/m.  General:  Well nourished, well developed, in no acute distress HEENT: normal Neck: no JVD Vascular: No carotid bruits; Distal pulses 2+ bilaterally Cardiac:  normal S1, S2; RRR; no murmur  Lungs:  clear to auscultation bilaterally, no wheezing, rhonchi or rales  Abd: soft, nontender, no hepatomegaly  Ext: no edema Musculoskeletal:  No deformities, BUE and BLE strength normal and equal Skin: warm and dry  Neuro:  CNs 2-12 intact, no focal abnormalities noted Psych:  Normal affect   EKG:  The EKG was personally reviewed and demonstrates:  sinus rhythm, rate 72, PVC, RBBB Telemetry:  Telemetry was personally reviewed and demonstrates:  sinus rhythm  Relevant CV Studies: TTE 2018 - Left ventricle: The cavity size was normal. Wall thickness was    normal. Features are consistent with a pseudonormal left    ventricular filling pattern, with concomitant abnormal relaxation    and increased filling pressure (grade 2 diastolic dysfunction).  - Aortic valve: Mildly to moderately calcified annulus. There was    mild regurgitation. Valve area (VTI): 1.19 cm^2. Valve area    (Vmax): 1.19 cm^2. Valve area (Vmean): 1.29 cm^2.  - Tricuspid valve: There was mild-moderate regurgitation.  - Pulmonary arteries: Systolic pressure was mildly increased. PA    peak pressure: 36 mm Hg (S).   Laboratory Data:  High Sensitivity Troponin:   Recent Labs  Lab 07/14/21 0242 07/14/21 0421 07/14/21 0622 07/14/21 0813  TROPONINIHS 21* 33* 53* 63*     Chemistry Recent Labs  Lab 07/14/21 0242  NA 133*  K 3.6  CL 100  CO2 23  GLUCOSE 104*  BUN 19  CREATININE 0.96  CALCIUM 9.0  GFRNONAA 54*  ANIONGAP 10    No  results for input(s): "PROT", "ALBUMIN", "AST", "ALT", "ALKPHOS", "BILITOT" in the last 168 hours. Lipids No results for input(s): "CHOL", "TRIG", "HDL", "LABVLDL", "LDLCALC", "CHOLHDL" in the last 168 hours.  Hematology Recent Labs  Lab 07/14/21 0242  WBC 6.0  RBC 4.15  HGB 13.6  HCT 39.7  MCV 95.7  MCH 32.8  MCHC 34.3  RDW 13.6  PLT 276   Thyroid No results for input(s): "TSH", "FREET4" in the last 168 hours.  BNPNo results for input(s): "BNP", "PROBNP" in the last 168 hours.  DDimer No results for input(s): "DDIMER" in the last 168 hours.   Radiology/Studies:  CT Angio Chest PE W and/or Wo Contrast  Result Date: 07/14/2021 CLINICAL DATA:  Chest pain or shortness of breath, pleurisy or effusion suspected EXAM: CT ANGIOGRAPHY CHEST WITH CONTRAST TECHNIQUE: Multidetector CT imaging of the chest was performed using the standard protocol during bolus administration of intravenous contrast. Multiplanar CT image reconstructions and MIPs were obtained to evaluate the vascular anatomy. RADIATION DOSE REDUCTION: This exam was performed according to the departmental dose-optimization program which includes automated exposure control, adjustment of the mA and/or kV according to patient size and/or use of iterative reconstruction technique. CONTRAST:  38m OMNIPAQUE IOHEXOL 350 MG/ML SOLN COMPARISON:  01/25/2016 chest CT FINDINGS: Cardiovascular: Satisfactory opacification of the pulmonary arteries to the segmental level. No evidence of pulmonary embolism. Normal heart size. No pericardial effusion. Atheromatous calcification. Mediastinum/Nodes: Negative for adenopathy or mass Lungs/Pleura: Dependent atelectasis, subsegmental. There is no edema, consolidation, effusion, or pneumothorax. Upper Abdomen: No acute finding.  Granulomatous calcifications Musculoskeletal: No acute finding Review of the MIP images confirms the above findings. IMPRESSION: 1. Negative for pulmonary embolism. 2. Low volume chest  with atelectasis. Electronically Signed  By: Jorje Guild M.D.   On: 07/14/2021 04:26   DG Chest Portable 1 View  Result Date: 07/14/2021 CLINICAL DATA:  Chest pain and cardiac palpitations EXAM: PORTABLE CHEST 1 VIEW COMPARISON:  05/21/2020 FINDINGS: Cardiac shadow is mildly enlarged but stable. Aortic calcifications are seen. Lungs are well aerated bilaterally. Minimal left basilar atelectasis is noted. No bony abnormality is seen. IMPRESSION: Minimal left basilar atelectasis. Electronically Signed   By: Inez Catalina M.D.   On: 07/14/2021 02:56     Assessment and Plan:   Paroxysmal atrial fibrillation: Patient not anticoagulated due to fall risk as deemed by her primary cardiologist.  Currently on atenolol, though very low dose.  She is continue to have palpitations.  On presentation emergency room, palpitations had subsided.  Could increase atenolol to 12.5 mg.  Aside from that, she is minimally symptomatic, no further work-up is necessary.   Risk Assessment/Risk Scores:          CHA2DS2-VASc Score = 4   This indicates a 4.8% annual risk of stroke. The patient's score is based upon: CHF History: 0 HTN History: 1 Diabetes History: 0 Stroke History: 0 Vascular Disease History: 0 Age Score: 2 Gender Score: 1     [facility] Shulamis Wenberg sign off.   Medication Recommendations:  increase atenolol  Other recommendations (labs, testing, etc):  none Follow up as an outpatient:  usual follow up with cardiology  For questions or updates, please contact [place] Please consult [email or web address] for contact info under    Signed, Ericha Whittingham Meredith Leeds, MD  07/14/2021 12:50 PM

## 2021-07-15 ENCOUNTER — Telehealth: Payer: Self-pay | Admitting: *Deleted

## 2021-07-15 NOTE — Telephone Encounter (Signed)
Called and left message.

## 2021-07-15 NOTE — Telephone Encounter (Signed)
Dr. Lebron Conners, Son called and stated that he wants Cindi Carbon to call him regarding some concerns with patient.   Stated that he is John's brother.   Wants you to call him at [phone]

## 2021-07-17 ENCOUNTER — Encounter: Payer: Self-pay | Admitting: Urology

## 2021-07-18 DIAGNOSIS — G3184 Mild cognitive impairment, so stated: Secondary | ICD-10-CM | POA: Diagnosis not present

## 2021-07-18 LAB — CBC AND DIFFERENTIAL
HCT: 42 (ref 36–46)
Hemoglobin: 14.3 (ref 12.0–16.0)
Platelets: 325 10*3/uL (ref 150–400)
WBC: 6.9

## 2021-07-18 LAB — BASIC METABOLIC PANEL
BUN: 17 (ref 4–21)
CO2: 23 — AB (ref 13–22)
Chloride: 100 (ref 99–108)
Creatinine: 0.8 (ref 0.5–1.1)
Glucose: 108
Potassium: 4.4 mEq/L (ref 3.5–5.1)
Sodium: 135 — AB (ref 137–147)

## 2021-07-18 LAB — HEPATIC FUNCTION PANEL
ALT: 9 U/L (ref 7–35)
AST: 16 (ref 13–35)
Alkaline Phosphatase: 68 (ref 25–125)
Bilirubin, Total: 0.5

## 2021-07-18 LAB — CBC: RBC: 4.33 (ref 3.87–5.11)

## 2021-07-18 LAB — COMPREHENSIVE METABOLIC PANEL
Albumin: 4.3 (ref 3.5–5.0)
Calcium: 9.4 (ref 8.7–10.7)
Globulin: 2.4

## 2021-07-18 LAB — VITAMIN B12: Vitamin B-12: 491

## 2021-07-18 LAB — TSH: TSH: 2.15 (ref 0.41–5.90)

## 2021-07-19 ENCOUNTER — Encounter: Payer: Self-pay | Admitting: Adult Health

## 2021-07-22 ENCOUNTER — Encounter: Payer: Medicare Other | Admitting: Adult Health

## 2021-07-22 NOTE — Progress Notes (Signed)
Location:  [facility]  POS: Clinic  Provider: Royal Hawthorn, ANP  Code Status:  Goals of Care:     07/19/2021    1:46 PM  Advanced Directives  Does Patient Have a Medical Advance Directive? Yes  Type of Advance Directive [place]  Does patient want to make changes to medical advance directive? No - Patient declined  Copy of [place] in Chart? Yes - validated most recent copy scanned in chart (See row information)     Chief Complaint  Patient presents with   Medical Management of Chronic Issues    Patient returns to the clinic to follow up on memory with MMSE.    Quality Metric Gaps    Verified patient is due for #5 covid vaccine.        Past Medical History:  Diagnosis Date   Acute upper respiratory infections of unspecified site 06/09/2011   Breast cancer ([place]) 12/11/2008   Fatigue 01/2012   Hypercholesterolemia    on lipitor   Hypertension    Hyponatremia 01/2016   Memory loss    Mitral valve prolapse    Other abnormal blood chemistry 01/21/2010   Palpitations 08/05/2000   Right bundle branch block    Scoliosis    Senile osteoporosis 09/28/2001    Past Surgical History:  Procedure Laterality Date   BREAST LUMPECTOMY Right 12/27/2007   LAPAROSCOPIC LYSIS OF ADHESIONS  07/15/2006   [name]   OTHER SURGICAL HISTORY     TONSILLECTOMY     TUBAL LIGATION  1963    No Known Allergies  Outpatient Encounter Medications as of 07/22/2021  Medication Sig   atenolol (TENORMIN) 25 MG tablet Take 1/2 tab daily   Cholecalciferol (VITAMIN D3) 1000 units CAPS Take 1,000 Units by mouth daily.    No facility-administered encounter medications on file as of 07/22/2021.    Review of Systems:  Review of Systems  Health Maintenance  Topic Date Due   COVID-19 Vaccine (5 - Booster for Moderna series) 12/14/2020   INFLUENZA VACCINE  08/06/2021   TETANUS/TDAP  05/14/2027   Pneumonia Vaccine 59+ Years old  Completed   DEXA SCAN   Completed   Zoster Vaccines- Shingrix  Completed   HPV VACCINES  Aged Out    Physical Exam: There were no vitals filed for this visit. There is no height or weight on file to calculate BMI. Physical Exam  Labs reviewed: Basic Metabolic Panel: Recent Labs    03/21/21 2220 06/18/21 0000 07/14/21 0242 07/18/21 0000  NA 135 135* 133* 135*  K 3.7 4.3 3.6 4.4  CL 99 100 100 100  CO2 27 23* 23 23*  GLUCOSE 162*  --  104*  --   BUN '23 18 19 17  '$ CREATININE 0.81 0.7 0.96 0.8  CALCIUM 10.7* 9.6 9.0 9.4  MG 2.1  --   --   --   TSH  --   --   --  2.15   Liver Function Tests: Recent Labs    11/15/20 0000 11/16/20 0000 06/18/21 0000 07/18/21 0000  AST  --  '15 15 16  '$ ALT  --  '9 8 9  '$ ALKPHOS  --  81 70 68  ALBUMIN 4.1  --   --  4.3   No results for input(s): "LIPASE", "AMYLASE" in the last 8760 hours. No results for input(s): "AMMONIA" in the last 8760 hours. CBC: Recent Labs    03/21/21 2220 06/18/21 0000 07/14/21 0242 07/18/21 0000  WBC 7.4 5.7 6.0 6.9  NEUTROABS  --   --  3.6  --   HGB 14.5 14.1 13.6 14.3  HCT 42.8 41 39.7 42  MCV 95.7  --  95.7  --   PLT 299 323 276 325   Lipid Panel: Recent Labs    08/28/20 0000  CHOL 369*  HDL 51  LDLCALC 284  TRIG 170*   Lab Results  Component Value Date   HGBA1C 5.9 (H) 01/23/2016    Procedures since last visit: CT Angio Chest PE W and/or Wo Contrast  Result Date: 07/14/2021 CLINICAL DATA:  Chest pain or shortness of breath, pleurisy or effusion suspected EXAM: CT ANGIOGRAPHY CHEST WITH CONTRAST TECHNIQUE: Multidetector CT imaging of the chest was performed using the standard protocol during bolus administration of intravenous contrast. Multiplanar CT image reconstructions and MIPs were obtained to evaluate the vascular anatomy. RADIATION DOSE REDUCTION: This exam was performed according to the departmental dose-optimization program which includes automated exposure control, adjustment of the mA and/or kV according to  patient size and/or use of iterative reconstruction technique. CONTRAST:  59m OMNIPAQUE IOHEXOL 350 MG/ML SOLN COMPARISON:  01/25/2016 chest CT FINDINGS: Cardiovascular: Satisfactory opacification of the pulmonary arteries to the segmental level. No evidence of pulmonary embolism. Normal heart size. No pericardial effusion. Atheromatous calcification. Mediastinum/Nodes: Negative for adenopathy or mass Lungs/Pleura: Dependent atelectasis, subsegmental. There is no edema, consolidation, effusion, or pneumothorax. Upper Abdomen: No acute finding.  Granulomatous calcifications Musculoskeletal: No acute finding Review of the MIP images confirms the above findings. IMPRESSION: 1. Negative for pulmonary embolism. 2. Low volume chest with atelectasis. Electronically Signed   By: JJorje GuildM.D.   On: 07/14/2021 04:26   DG Chest Portable 1 View  Result Date: 07/14/2021 CLINICAL DATA:  Chest pain and cardiac palpitations EXAM: PORTABLE CHEST 1 VIEW COMPARISON:  05/21/2020 FINDINGS: Cardiac shadow is mildly enlarged but stable. Aortic calcifications are seen. Lungs are well aerated bilaterally. Minimal left basilar atelectasis is noted. No bony abnormality is seen. IMPRESSION: Minimal left basilar atelectasis. Electronically Signed   By: MInez CatalinaM.D.   On: 07/14/2021 02:56    Assessment/Plan

## 2021-07-23 NOTE — Progress Notes (Signed)
This encounter was created in error - please disregard.

## 2021-08-16 NOTE — Progress Notes (Unsigned)
Cardiology Office Note:    Date:  08/20/2021   ID:  Cynthia Watkins, DOB 1924/07/17, MRN [id number]  PCP:  Royal Hawthorn, NP   [facility] Providers Cardiologist:  Margarine Grosshans [place], MD     Referring MD: Royal Hawthorn, NP   Chief Complaint  Patient presents with   Atrial Fibrillation    History of Present Illness:    Cynthia Watkins is a 88 y.o. female with a hx of hypertension, hyperlipidemia, RBBB and history of palpitation.  Her son is a urologist.  Patient was previously seen for dizziness and palpitations in 2016.  Event monitor showed no significant arrhythmia.  Echocardiogram obtained on 07/13/2014 showed EF 55 to 60%, no regional wall motion abnormality, mild AI, mild to moderate MR, mild LAE.  All of her antihypertensive medication were discontinued.  She was seen again in 2017 for increased palpitation, repeat event monitor showed no significant arrhythmia other than PACs and one brief run of NSVT.  In January 2018, patient was admitted to the hospital with GI illness and acute encephalopathy with severe hyponatremia.  She was treated with hypertonic saline and Lasix.  She was eventually discharged on atenolol and Lasix.  She returned to the ED in late February 2018 with shortness of breath and lightheadedness.  She was orthostatic at the time, Lasix was discontinued.  Repeat echocardiogram obtained in February 2018 showed normal EF, grade 2 DD, mild to moderate TR, PA peak pressure 36 mmHg.    In October 2021 she had a single episode of tachycardia palpitation for which she took extra dose of atenolol.  She was seen by Caron Presume on 01/15/2021 at which time she was doing well.  Prior to that, she did go to the ED in May 2022 for racing heartbeat, however by the time she arrived in the ED, EKG showed normal rhythm.  Since the last visit, patient went to the ED on 03/21/2021 with rapid heart rate.  EKG on arrival showed atrial fibrillation with RVR with heart rate in the 140s.  She  was given a single dose of metoprolol and her heart rate slowed down and converted spontaneously back to sinus rhythm.  Basic lab work was unremarkable.  Her CHA2DS-Vasc score was elevated, however the ED physician discussed the case with her son and they made the joint decision not to pursue anticoagulation at the present. She was seen in follow up with Almyra Deforest PA-C in March and doing well. Anticoagulation again deferred.   She was seen on July 9 after symptoms of palpitations. Symptoms resolved before she arrived in ED. Was in NSR. Troponin 21> 33> 53> 63. Was DC home on higher dose of atenolol.   Since then she has done very well. No palpitations, dizziness, edema or chest pain. Walks several miles a day at [employer] Feels well.      Past Medical History:  Diagnosis Date   Acute upper respiratory infections of unspecified site 06/09/2011   Breast cancer ([place]) 12/11/2008   Fatigue 01/2012   Hypercholesterolemia    on lipitor   Hypertension    Hyponatremia 01/2016   Memory loss    Mitral valve prolapse    Other abnormal blood chemistry 01/21/2010   Palpitations 08/05/2000   Right bundle branch block    Scoliosis    Senile osteoporosis 09/28/2001    Past Surgical History:  Procedure Laterality Date   BREAST LUMPECTOMY Right 12/27/2007   LAPAROSCOPIC LYSIS OF ADHESIONS  07/15/2006   [name]  OTHER SURGICAL HISTORY     TONSILLECTOMY     TUBAL LIGATION  1963    Current Medications: Current Meds  Medication Sig   atenolol (TENORMIN) 25 MG tablet Take 1/2 tab daily   Cholecalciferol (VITAMIN D3) 1000 units CAPS Take 1,000 Units by mouth daily.      Allergies:   Patient has no known allergies.   Social History   Socioeconomic History   Marital status: Widowed    Spouse name: Not on file   Number of children: 3   Years of education: Not on file   Highest education level: Not on file  Occupational History   Occupation: Retired Product manager: RETIRED  Tobacco Use    Smoking status: Former    Packs/day: 0.25    Years: 10.00    Total pack years: 2.50    Types: Cigarettes    Quit date: 08/06/1954    Years since quitting: 67.0   Smokeless tobacco: Never  Vaping Use   Vaping Use: Never used  Substance and Sexual Activity   Alcohol use: Yes    Comment: Occasionally    Drug use: No   Sexual activity: Never  Other Topics Concern   Not on file  Social History Narrative   Lives at [place] since 05/1998   Widowed   Living will   Former smoker, stopped 1956   Alcohol  Rare   Exercise - walk one hour daily 7 days a week,  3 days of stretching and strengthen, yoga 45 minutes 2 days a week   Social Determinants of Health   Financial Resource Strain: Low Risk  (09/15/2017)   Overall Financial Resource Strain (CARDIA)    Difficulty of Paying Living Expenses: Not hard at all  Food Insecurity: No Food Insecurity (09/15/2017)   Hunger Vital Sign    Worried About Running Out of Food in the Last Year: Never true    [place] in the Last Year: Never true  Transportation Needs: No Transportation Needs (09/15/2017)   PRAPARE - Hydrologist (Medical): No    Lack of Transportation (Non-Medical): No  Physical Activity: Sufficiently Active (09/15/2017)   Exercise Vital Sign    Days of Exercise per Week: 7 days    Minutes of Exercise per Session: 120 min  Stress: No Stress Concern Present (09/15/2017)   [place]    Feeling of Stress : Not at all  Social Connections: Moderately Isolated (09/15/2017)   Social Connection and Isolation Panel [NHANES]    Frequency of Communication with Friends and Family: More than three times a week    Frequency of Social Gatherings with Friends and Family: More than three times a week    Attends Religious Services: Never    Marine scientist or Organizations: No    Attends Archivist Meetings: Never    Marital  Status: Widowed     Family History: The patient's family history includes Alzheimer's disease in her sister; Cancer in her father; Stroke in her mother.  ROS:   Please see the history of present illness.     All other systems reviewed and are negative.  EKGs/Labs/Other Studies Reviewed:    The following studies were reviewed today:  Echo 02/21/2016 - Left ventricle: The cavity size was normal. Wall thickness was    normal. Features are consistent with a pseudonormal left    ventricular filling pattern, with  concomitant abnormal relaxation    and increased filling pressure (grade 2 diastolic dysfunction).  - Aortic valve: Mildly to moderately calcified annulus. There was    mild regurgitation. Valve area (VTI): 1.19 cm^2. Valve area    (Vmax): 1.19 cm^2. Valve area (Vmean): 1.29 cm^2.  - Tricuspid valve: There was mild-moderate regurgitation.  - Pulmonary arteries: Systolic pressure was mildly increased. PA    peak pressure: 36 mm Hg (S).   EKG:  EKG is not ordered today.   Recent Labs: 03/21/2021: Magnesium 2.1 07/18/2021: ALT 9; BUN 17; Creatinine 0.8; Hemoglobin 14.3; Platelets 325; Potassium 4.4; Sodium 135; TSH 2.15  Recent Lipid Panel    Component Value Date/Time   CHOL 369 (A) 08/28/2020 0000   TRIG 170 (A) 08/28/2020 0000   HDL 51 08/28/2020 0000   CHOLHDL 3.5 01/27/2016 0241   VLDL 19 01/27/2016 0241   LDLCALC 284 08/28/2020 0000     Risk Assessment/Calculations:    CHA2DS2-VASc Score = 4   This indicates a 4.8% annual risk of stroke. The patient's score is based upon: CHF History: 0 HTN History: 1 Diabetes History: 0 Stroke History: 0 Vascular Disease History: 0 Age Score: 2 Gender Score: 1          Physical Exam:    VS:  BP 128/72   Pulse 65   Ht '5\' 1"'$  (1.549 m)   Wt 110 lb 6.4 oz (50.1 kg)   SpO2 98%   BMI 20.86 kg/m     Wt Readings from Last 3 Encounters:  08/20/21 110 lb 6.4 oz (50.1 kg)  07/14/21 115 lb (52.2 kg)  04/30/21 115 lb  12.8 oz (52.5 kg)     GEN:  Well nourished, well developed in no acute distress HEENT: Normal NECK: No JVD; No carotid bruits LYMPHATICS: No lymphadenopathy CARDIAC: RRR, no murmurs, rubs, gallops RESPIRATORY:  Clear to auscultation without rales, wheezing or rhonchi  ABDOMEN: Soft, non-tender, non-distended MUSCULOSKELETAL:  No edema; No deformity  SKIN: Warm and dry NEUROLOGIC:  Alert and oriented x 3 PSYCHIATRIC:  Normal affect   ASSESSMENT:    1. PAF (paroxysmal atrial fibrillation) ([place])   2. Essential hypertension, benign   3. Hypercholesterolemia     PLAN:    In order of problems listed above:  PAF: infrequent symptoms that are self limiting. No recurrence since atenolol dose increased. Still on low dose. I think it is reasonable to defer anticoagulation for now.   Hypertension: Blood pressure is ok.   Hyperlipidemia: Diet controlled.    Follow up in 6 months        Medication Adjustments/Labs and Tests Ordered: Current medicines are reviewed at length with the patient today.  Concerns regarding medicines are outlined above.  No orders of the defined types were placed in this encounter.  No orders of the defined types were placed in this encounter.   There are no Patient Instructions on file for this visit.   Signed, Albina Gosney [place], MD  08/20/2021 4:19 PM    [facility]

## 2021-08-20 ENCOUNTER — Ambulatory Visit: Payer: Medicare Other | Admitting: Cardiology

## 2021-08-20 ENCOUNTER — Encounter: Payer: Self-pay | Admitting: Cardiology

## 2021-08-20 VITALS — BP 128/72 | HR 65 | Ht 61.0 in | Wt 110.4 lb

## 2021-08-20 DIAGNOSIS — E7800 Pure hypercholesterolemia, unspecified: Secondary | ICD-10-CM | POA: Diagnosis not present

## 2021-08-20 DIAGNOSIS — I480 Paroxysmal atrial fibrillation: Secondary | ICD-10-CM

## 2021-08-20 DIAGNOSIS — I10 Essential (primary) hypertension: Secondary | ICD-10-CM | POA: Diagnosis not present

## 2021-08-27 ENCOUNTER — Other Ambulatory Visit: Payer: Self-pay | Admitting: Orthopedic Surgery

## 2021-08-27 DIAGNOSIS — E559 Vitamin D deficiency, unspecified: Secondary | ICD-10-CM

## 2021-08-27 MED ORDER — VITAMIN D3 25 MCG (1000 UT) PO CAPS: 1000.0000 [IU] | ORAL_CAPSULE | Freq: Every day | ORAL | 10 refills | Status: AC

## 2021-10-08 ENCOUNTER — Other Ambulatory Visit (HOSPITAL_BASED_OUTPATIENT_CLINIC_OR_DEPARTMENT_OTHER): Payer: Self-pay

## 2021-10-08 MED ORDER — FLUAD QUADRIVALENT 0.5 ML IM PRSY
PREFILLED_SYRINGE | INTRAMUSCULAR | 0 refills | Status: DC
  Filled 2021-10-08: qty 0.5, 1d supply, fill #0

## 2021-10-30 ENCOUNTER — Other Ambulatory Visit (HOSPITAL_BASED_OUTPATIENT_CLINIC_OR_DEPARTMENT_OTHER): Payer: Self-pay

## 2021-10-30 MED ORDER — COMIRNATY 30 MCG/0.3ML IM SUSY
PREFILLED_SYRINGE | INTRAMUSCULAR | 0 refills | Status: DC
  Filled 2021-10-30: qty 0.3, 1d supply, fill #0

## 2022-01-08 ENCOUNTER — Encounter (HOSPITAL_BASED_OUTPATIENT_CLINIC_OR_DEPARTMENT_OTHER): Payer: Self-pay

## 2022-01-08 ENCOUNTER — Other Ambulatory Visit: Payer: Self-pay

## 2022-01-08 ENCOUNTER — Emergency Department (HOSPITAL_BASED_OUTPATIENT_CLINIC_OR_DEPARTMENT_OTHER)
Admission: EM | Admit: 2022-01-08 | Discharge: 2022-01-08 | Disposition: A | Discharge status: Home / Self Care | Payer: Medicare Other | Attending: Emergency Medicine | Admitting: Emergency Medicine

## 2022-01-08 ENCOUNTER — Emergency Department (HOSPITAL_BASED_OUTPATIENT_CLINIC_OR_DEPARTMENT_OTHER): Payer: Medicare Other

## 2022-01-08 ENCOUNTER — Emergency Department (HOSPITAL_BASED_OUTPATIENT_CLINIC_OR_DEPARTMENT_OTHER): Payer: Medicare Other | Admitting: Radiology

## 2022-01-08 DIAGNOSIS — S0990XA Unspecified injury of head, initial encounter: Secondary | ICD-10-CM | POA: Insufficient documentation

## 2022-01-08 DIAGNOSIS — T1490XA Injury, unspecified, initial encounter: Secondary | ICD-10-CM | POA: Diagnosis not present

## 2022-01-08 DIAGNOSIS — S2241XA Multiple fractures of ribs, right side, initial encounter for closed fracture: Secondary | ICD-10-CM

## 2022-01-08 DIAGNOSIS — W19XXXA Unspecified fall, initial encounter: Secondary | ICD-10-CM | POA: Insufficient documentation

## 2022-01-08 DIAGNOSIS — R0789 Other chest pain: Secondary | ICD-10-CM | POA: Diagnosis present

## 2022-01-08 DIAGNOSIS — R001 Bradycardia, unspecified: Secondary | ICD-10-CM | POA: Diagnosis not present

## 2022-01-08 LAB — CBC WITH DIFFERENTIAL/PLATELET
Abs Immature Granulocytes: 0.03 10*3/uL (ref 0.00–0.07)
Basophils Absolute: 0 10*3/uL (ref 0.0–0.1)
Basophils Relative: 0 %
Eosinophils Absolute: 0.1 10*3/uL (ref 0.0–0.5)
Eosinophils Relative: 1 %
HCT: 42.2 % (ref 36.0–46.0)
Hemoglobin: 14.1 g/dL (ref 12.0–15.0)
Immature Granulocytes: 0 %
Lymphocytes Relative: 28 %
Lymphs Abs: 2.2 10*3/uL (ref 0.7–4.0)
MCH: 32.6 pg (ref 26.0–34.0)
MCHC: 33.4 g/dL (ref 30.0–36.0)
MCV: 97.5 fL (ref 80.0–100.0)
Monocytes Absolute: 0.7 10*3/uL (ref 0.1–1.0)
Monocytes Relative: 9 %
Neutro Abs: 4.8 10*3/uL (ref 1.7–7.7)
Neutrophils Relative %: 62 %
Platelets: 279 10*3/uL (ref 150–400)
RBC: 4.33 MIL/uL (ref 3.87–5.11)
RDW: 14.1 % (ref 11.5–15.5)
WBC: 7.8 10*3/uL (ref 4.0–10.5)
nRBC: 0 % (ref 0.0–0.2)

## 2022-01-08 LAB — BASIC METABOLIC PANEL
Anion gap: 7 (ref 5–15)
BUN: 21 mg/dL (ref 8–23)
CO2: 27 mmol/L (ref 22–32)
Calcium: 9.9 mg/dL (ref 8.9–10.3)
Chloride: 101 mmol/L (ref 98–111)
Creatinine, Ser: 0.96 mg/dL (ref 0.44–1.00)
GFR, Estimated: 54 mL/min — ABNORMAL LOW (ref >60)
Glucose, Bld: 109 mg/dL — ABNORMAL HIGH (ref 70–99)
Potassium: 4.5 mmol/L (ref 3.5–5.1)
Sodium: 135 mmol/L (ref 135–145)

## 2022-01-08 LAB — TROPONIN I (HIGH SENSITIVITY)
Troponin I (High Sensitivity): 6 ng/L (ref <18)
Troponin I (High Sensitivity): 6 ng/L (ref <18)

## 2022-01-08 LAB — CBG MONITORING, ED: Glucose-Capillary: 102 mg/dL — ABNORMAL HIGH (ref 70–99)

## 2022-01-08 MED ORDER — ACETAMINOPHEN 325 MG PO TABS
650.0000 mg | ORAL_TABLET | Freq: Once | ORAL | Status: AC
  Administered 2022-01-08: 650 mg via ORAL
  Filled 2022-01-08: qty 2

## 2022-01-08 NOTE — ED Notes (Addendum)
Back from CT, pending results

## 2022-01-08 NOTE — Discharge Instructions (Addendum)
It was a pleasure taking care of you today!  Your labs today did not show any emergent concerning findings.  The CT of your head and neck did not show any concerning findings today.  Your x-ray of your ribs show concern for right rib fractures to 6th, 7th, 8th fractures. Your CT of your chest showed concern for 6th, 8th, 10th, 11th ribs on the right. You will be sent an incentive spirometer, use as directed. You may take over the counter 500 mg tylenol every 6 hours for your pain as needed. You may place ice to the affected area every 15 minutes as needed and place a barrier to the skin. You may follow up with your primary care provider as needed. Return to the ED if you are experiencing increasing/worsening symptoms.

## 2022-01-08 NOTE — ED Provider Notes (Signed)
[facility] EMERGENCY DEPT Provider Note   CSN: [id number] Arrival date & time: 01/08/22  1144     History  Chief Complaint  Patient presents with   [name]    Cynthia Watkins is a 89 y.o. female who presents to the ED with concerns for fall onset PTA.  Pt unsure how she fell. She has pain to her right lateral ribs. No anticoags at this time. Denies hitting her head, however, she is unsure of that. No LOC. Denies dizziness, lightheadedness, neck pain, back pain, headache. Pt unsure if she had a mechanical fall at this time.    The history is provided by the patient. No language interpreter was used.       Home Medications Prior to Admission medications   Medication Sig Start Date End Date Taking? Authorizing Provider  atenolol (TENORMIN) 25 MG tablet Take 1/2 tab daily 07/14/21   Debbe Odea, MD  Cholecalciferol (VITAMIN D3) 25 MCG (1000 UT) CAPS Take 1 capsule (1,000 Units total) by mouth daily. 08/27/21   Yvonna Alanis, NP  COVID-19 mRNA vaccine [id number] (COMIRNATY) syringe Inject into the muscle. 10/30/21   Carlyle Basques, MD  influenza vaccine adjuvanted (FLUAD QUADRIVALENT) 0.5 ML injection Inject into the muscle. 10/07/21   Carlyle Basques, MD      Allergies    Patient has no known allergies.    Review of Systems   Review of Systems  All other systems reviewed and are negative.   Physical Exam Updated Vital Signs BP (!) 152/76 (BP Location: Right Arm)   Pulse (!) 50   Temp 97.8 F (36.6 C) (Oral)   Resp (!) 25   Ht '5\' 1"'$  (1.549 m)   Wt 50.1 kg   SpO2 97%   BMI 20.87 kg/m  Physical Exam Vitals and nursing note reviewed.  Constitutional:      General: She is not in acute distress.    Appearance: She is not diaphoretic.  HENT:     Head: Normocephalic and atraumatic.     Mouth/Throat:     Pharynx: No oropharyngeal exudate.  Eyes:     General: No scleral icterus.    Conjunctiva/sclera: Conjunctivae normal.  Cardiovascular:     Rate and Rhythm:  Normal rate and regular rhythm.     Pulses: Normal pulses.     Heart sounds: Normal heart sounds.  Pulmonary:     Effort: Pulmonary effort is normal. No respiratory distress.     Breath sounds: Normal breath sounds. No wheezing.  Chest:     Chest wall: Tenderness present.     Comments: TTP noted to right lateral ribs.  Abdominal:     General: Bowel sounds are normal.     Palpations: Abdomen is soft. There is no mass.     Tenderness: There is no abdominal tenderness. There is no guarding or rebound.  Musculoskeletal:        General: Normal range of motion.     Cervical back: Normal range of motion and neck supple.     Comments: No spinal TTP.  Skin:    General: Skin is warm and dry.  Neurological:     Mental Status: She is alert.  Psychiatric:        Behavior: Behavior normal.     ED Results / Procedures / Treatments   Labs (all labs ordered are listed, but only abnormal results are displayed) Labs Reviewed  BASIC METABOLIC PANEL - Abnormal; Notable for the following components:  Result Value   Glucose, Bld 109 (*)    GFR, Estimated 54 (*)    All other components within normal limits  CBG MONITORING, ED - Abnormal; Notable for the following components:   Glucose-Capillary 102 (*)    All other components within normal limits  CBC WITH DIFFERENTIAL/PLATELET  TROPONIN I (HIGH SENSITIVITY)  TROPONIN I (HIGH SENSITIVITY)    EKG EKG Interpretation  Date/Time:  Wednesday January 08 2022 12:19:16 EST Ventricular Rate:  49 PR Interval:  208 QRS Duration: 104 QT Interval:  430 QTC Calculation: 388 R Axis:   10 Text Interpretation: Sinus bradycardia Incomplete right bundle branch block Borderline ECG When compared with ECG of 14-Jul-2021 02:25, PREVIOUS ECG IS PRESENT No STEMI Confirmed by Octaviano Glow [phone]) on 01/08/2022 1:52:37 PM  Radiology CT Chest Wo Contrast  Addendum Date: 01/08/2022   ADDENDUM REPORT: 01/08/2022 14:48 ADDENDUM: There may be a minimally  displaced fracture involving the anterior portion of the right eighth rib. Electronically Signed   By: Marijo Conception M.D.   On: 01/08/2022 14:48   Result Date: 01/08/2022 CLINICAL DATA:  Chest pain after fall. EXAM: CT CHEST WITHOUT CONTRAST TECHNIQUE: Multidetector CT imaging of the chest was performed following the standard protocol without IV contrast. RADIATION DOSE REDUCTION: This exam was performed according to the departmental dose-optimization program which includes automated exposure control, adjustment of the mA and/or kV according to patient size and/or use of iterative reconstruction technique. COMPARISON:  July 14, 2021. FINDINGS: Cardiovascular: Atherosclerosis of thoracic aorta is noted without aneurysm formation. Mild cardiomegaly. No pericardial effusion. Minimal coronary artery calcifications are noted. Mediastinum/Nodes: No enlarged mediastinal or axillary lymph nodes. Thyroid gland, trachea, and esophagus demonstrate no significant findings. Lungs/Pleura: No pneumothorax or pleural effusion is noted. Minimal bibasilar subsegmental atelectasis or scarring is noted. Mild biapical scarring is noted. Upper Abdomen: No acute abnormality. Musculoskeletal: Minimally displaced fractures are seen involving the right sixth, tenth and eleventh ribs. IMPRESSION: Minimal bibasilar subsegmental atelectasis or scarring. Minimal coronary artery calcifications are noted. Minimally displaced right sixth, tenth and eleventh rib fractures. Aortic Atherosclerosis (ICD10-I70.0). Electronically Signed: By: Marijo Conception M.D. On: 01/08/2022 14:36   DG Ribs Unilateral W/Chest Right  Result Date: 01/08/2022 CLINICAL DATA:  right rib pain status post fall EXAM: RIGHT RIBS AND CHEST - 3+ VIEW COMPARISON:  CT 07/14/2021 FINDINGS: Unchanged cardiomediastinal silhouette. There is no focal airspace consolidation. There is no pleural effusion or evidence of pneumothorax. There are minimally displaced right anterolateral  sixth, seventh, and eighth rib fractures. IMPRESSION: Minimally displaced right anterolateral sixth, seventh, and eighth rib fractures. No acute cardiopulmonary disease. Electronically Signed   By: Maurine Simmering M.D.   On: 01/08/2022 12:47   CT Head Wo Contrast  Result Date: 01/08/2022 CLINICAL DATA:  Trauma EXAM: CT HEAD WITHOUT CONTRAST CT CERVICAL SPINE WITHOUT CONTRAST TECHNIQUE: Multidetector CT imaging of the head and cervical spine was performed following the standard protocol without intravenous contrast. Multiplanar CT image reconstructions of the cervical spine were also generated. RADIATION DOSE REDUCTION: This exam was performed according to the departmental dose-optimization program which includes automated exposure control, adjustment of the mA and/or kV according to patient size and/or use of iterative reconstruction technique. COMPARISON:  CT Head 07/12/20 FINDINGS: CT HEAD FINDINGS Brain: No evidence of acute infarction, hemorrhage, hydrocephalus, extra-axial collection or mass lesion/mass effect. Sequela of mild chronic microvascular ischemic change. Vascular: No hyperdense vessel or unexpected calcification. Skull: Normal. Negative for fracture or focal lesion. Sinuses/Orbits: Bilateral lens replacement.  Paranasal sinuses and mastoid air cells are clear. No middle ear effusion. Other: None. CT CERVICAL SPINE FINDINGS Alignment: Trace retrolisthesis of C4 on C5. Skull base and vertebrae: No acute fracture. No primary bone lesion or focal pathologic process. There is fusion of the C2-C3 facet joints on the left. Soft tissues and spinal canal: No prevertebral fluid or swelling. No visible canal hematoma. Disc levels:  No evidence of high-grade spinal canal stenosis. Upper chest: Negative. Other: 1.2 cm left thyroid nodule. IMPRESSION: 1. No acute intracranial abnormality. 2. No acute cervical spine fracture. Electronically Signed   By: Marin Roberts M.D.   On: 01/08/2022 12:43   CT Cervical Spine Wo  Contrast  Result Date: 01/08/2022 CLINICAL DATA:  Trauma EXAM: CT HEAD WITHOUT CONTRAST CT CERVICAL SPINE WITHOUT CONTRAST TECHNIQUE: Multidetector CT imaging of the head and cervical spine was performed following the standard protocol without intravenous contrast. Multiplanar CT image reconstructions of the cervical spine were also generated. RADIATION DOSE REDUCTION: This exam was performed according to the departmental dose-optimization program which includes automated exposure control, adjustment of the mA and/or kV according to patient size and/or use of iterative reconstruction technique. COMPARISON:  CT Head 07/12/20 FINDINGS: CT HEAD FINDINGS Brain: No evidence of acute infarction, hemorrhage, hydrocephalus, extra-axial collection or mass lesion/mass effect. Sequela of mild chronic microvascular ischemic change. Vascular: No hyperdense vessel or unexpected calcification. Skull: Normal. Negative for fracture or focal lesion. Sinuses/Orbits: Bilateral lens replacement. Paranasal sinuses and mastoid air cells are clear. No middle ear effusion. Other: None. CT CERVICAL SPINE FINDINGS Alignment: Trace retrolisthesis of C4 on C5. Skull base and vertebrae: No acute fracture. No primary bone lesion or focal pathologic process. There is fusion of the C2-C3 facet joints on the left. Soft tissues and spinal canal: No prevertebral fluid or swelling. No visible canal hematoma. Disc levels:  No evidence of high-grade spinal canal stenosis. Upper chest: Negative. Other: 1.2 cm left thyroid nodule. IMPRESSION: 1. No acute intracranial abnormality. 2. No acute cervical spine fracture. Electronically Signed   By: Marin Roberts M.D.   On: 01/08/2022 12:43    Procedures Procedures    Medications Ordered in ED Medications  acetaminophen (TYLENOL) tablet 650 mg (650 mg Oral Given 01/08/22 1524)    ED Course/ Medical Decision Making/ A&P Clinical Course as of 01/08/22 1553  Wed Jan 08, 2022  1403 89 yo female here with  mechanical fall, right rib pain, here with family present.  Found to have multiple PTX on xray and CT, no flail chest.  Pain well controlled with minimal intervention.  Gave Incentive spirometer training, okay to f/u with PCP.  Hospitalization considered but she is not hypoxic, tachypneic, and stable for outpatient mgmt at this time. [MT]  9211 Discussed with patient and family regarding CT and lab findings. Discussed discharge treatment plan with patient and family at bedside. Answered all available questions. Pt appears safe for discharge at this time.  [SB]    Clinical Course User Index [MT] Trifan, Carola Rhine, MD [SB] Emmerson Taddei A, PA-C                           Medical Decision Making Amount and/or Complexity of Data Reviewed Labs: ordered. Radiology: ordered. ECG/medicine tests: ordered.  Risk OTC drugs.   Pt with fall occurring PTA. Unsure if she hit her head. Denies LOC. Pt afebrile.  On exam patient with no spinal tenderness to palpation.  Tenderness to palpation noted  to right lateral ribs.  No acute cardiovascular respiratory exam findings.  Differential diagnosis includes fracture, dislocation, contusion, arrhythmia, electrolyte abnormality, anemia, hypoglycemia.  Additional history obtained:  Additional history obtained from Daughter/Son  Labs:  I ordered, and personally interpreted labs.  The pertinent results include:   Initial and delta troponin stable at 6 CBG at 102 BMP unremarkable CBC unremarkable  Imaging: I ordered imaging studies including CT head, cervical, chest, right rib x-ray I independently visualized and interpreted imaging which showed: No acute findings noted on CT head or cervical spine.  Right rib x-ray with  Minimally displaced right anterolateral sixth, seventh, and eighth  rib fractures.    No acute cardiopulmonary disease.   CT chest with  Minimal bibasilar subsegmental atelectasis or scarring.    Minimal coronary artery calcifications  are noted.    Minimally displaced right sixth, eight, tenth and eleventh rib fractures.     Aortic Atherosclerosis (ICD10-I70.0).   I agree with the radiologist interpretation  Medications:  I ordered medication including tylenol for symptom management I have reviewed the patients home medicines and have made adjustments as needed    Disposition: Presentation suspicious for fall as well as right rib fractures.  No dislocations noted.  No acute intracranial abnormality, pneumothorax.  Doubt arrhythmia, anemia, electrolyte abnormality, hypoglycemia. After consideration of the diagnostic results and the patients response to treatment, I feel that the patient would benefit from Discharge home.  Incentive spirometer given to patient today. Patient instructed on its use. Supportive care and strict return precautions discussed with patient and family member. Appears safe for discharge at this time. Follow up as indicated in discharge paperwork.   This chart was dictated using voice recognition software, Dragon. Despite the best efforts of this provider to proofread and correct errors, errors may still occur which can change documentation meaning.  Final Clinical Impression(s) / ED Diagnoses Final diagnoses:  Closed fracture of multiple ribs of right side, initial encounter    Rx / DC Orders ED Discharge Orders     None         Chaniya Genter A, PA-C 01/08/22 1554    Wyvonnia Dusky, MD 01/08/22 [phone]

## 2022-01-08 NOTE — ED Triage Notes (Signed)
Patient here POV from [facility].  Endorses Fall this AM (Unsure of how/why she fell). Pain to Right Lateral Chest/Torso.   No Known Head Injury but Unsure. No LOC However.   NAD Noted during Triage. BIB Wheelchair.

## 2022-01-08 NOTE — ED Notes (Signed)
Dr. Langston Masker at [facility]. Pt c/o R thoracic back pain c/w FXs.

## 2022-01-08 NOTE — ED Notes (Signed)
To CT via stretcher

## 2022-01-08 NOTE — ED Provider Triage Note (Signed)
Emergency Medicine Provider Triage Evaluation Note  Cynthia Watkins , a 89 y.o. female  was evaluated in triage.  Pt complains of fall PTA. Pt unsure how she fell. She has pain to her right lateral ribs. No anticoags at this time. Denies hitting her head, however, she is unsure of that. No LOC. Denies dizziness, lightheadedness, neck pain, back pain, headache. Pt unsure if she had a mechanical fall at this time.   Review of Systems  Positive:  Negative:   Physical Exam  BP 129/73 (BP Location: Left Arm)   Pulse (!) 49   Temp 98.4 F (36.9 C)   Resp (!) 22   Ht '5\' 1"'$  (1.549 m)   Wt 50.1 kg   SpO2 96%   BMI 20.87 kg/m  Gen:   Awake, no distress   Resp:  Normal effort  MSK:   Moves extremities without difficulty  Other:  No spinal TTP. TTP noted to right lateral ribs.   Medical Decision Making  Medically screening exam initiated at 12:09 PM.  Appropriate orders placed.  [name] was informed that the remainder of the evaluation will be completed by another provider, this initial triage assessment does not replace that evaluation, and the importance of remaining in the ED until their evaluation is complete.     Tiwan Schnitker A, PA-C 01/08/22 1212

## 2022-01-08 NOTE — ED Notes (Signed)
RT instructed the Pt on incentive Pt was able to do 1150.

## 2022-01-09 ENCOUNTER — Encounter: Payer: Self-pay | Admitting: Adult Health

## 2022-01-09 ENCOUNTER — Non-Acute Institutional Stay (SKILLED_NURSING_FACILITY): Payer: Medicare Other | Admitting: Adult Health

## 2022-01-09 DIAGNOSIS — I4510 Unspecified right bundle-branch block: Secondary | ICD-10-CM | POA: Diagnosis not present

## 2022-01-09 DIAGNOSIS — G3184 Mild cognitive impairment, so stated: Secondary | ICD-10-CM | POA: Diagnosis not present

## 2022-01-09 DIAGNOSIS — I10 Essential (primary) hypertension: Secondary | ICD-10-CM | POA: Diagnosis not present

## 2022-01-09 DIAGNOSIS — S2241XA Multiple fractures of ribs, right side, initial encounter for closed fracture: Secondary | ICD-10-CM | POA: Diagnosis not present

## 2022-01-09 DIAGNOSIS — I480 Paroxysmal atrial fibrillation: Secondary | ICD-10-CM | POA: Diagnosis not present

## 2022-01-09 DIAGNOSIS — H6121 Impacted cerumen, right ear: Secondary | ICD-10-CM

## 2022-01-09 DIAGNOSIS — H9193 Unspecified hearing loss, bilateral: Secondary | ICD-10-CM | POA: Diagnosis not present

## 2022-01-09 MED ORDER — ACETAMINOPHEN 325 MG PO TABS: 650.0000 mg | ORAL_TABLET | Freq: Three times a day (TID) | ORAL | 0 refills | Status: DC

## 2022-01-09 MED ORDER — TRAMADOL HCL 50 MG PO TABS: 50.0000 mg | ORAL_TABLET | Freq: Three times a day (TID) | ORAL | 0 refills | Status: DC | PRN

## 2022-01-09 MED ORDER — METHOCARBAMOL 500 MG PO TABS: 500.0000 mg | ORAL_TABLET | Freq: Three times a day (TID) | ORAL | 0 refills | Status: DC | PRN

## 2022-01-09 NOTE — Progress Notes (Signed)
Location:  [facility] Room Number: 154A Place of Service:  SNF [phone]) Provider:  Royal Hawthorn, NP   Royal Hawthorn, NP  Patient Care Team: Royal Hawthorn, NP as PCP - General (Nurse Practitioner) Martinique, Peter M, MD as PCP - Cardiology (Cardiology) Prentiss Bells, MD as Consulting Physician (Ophthalmology) [place], Peter M, MD as Consulting Physician (Cardiology) Allyn Kenner, MD as Consulting Physician (Dermatology) Michael Boston, MD as Consulting Physician (General Surgery) [facility], Well Loreli Slot, MD as Consulting Physician (Hematology and Oncology) Magrinat, Virgie Dad, MD (Inactive) as Consulting Physician (Oncology) [name], MD as Consulting Physician (Ophthalmology)  Extended Emergency Contact Information Primary Emergency Contact: Gertha Calkin of [place] Phone: [phone] Work Phone: [phone] Mobile Phone: [phone] Relation: Son Secondary Emergency Contact: Joesphine Bare of [place] Phone: [phone] Mobile Phone: [phone] Relation: Son  Code Status:  DNR Goals of care: Advanced Directive information    01/09/2022    9:13 AM  Advanced Directives  Does Patient Have a Medical Advance Directive? Yes  Type of Advance Directive Out of facility DNR (pink MOST or yellow form);Healthcare Power of Attorney  Does patient want to make changes to medical advance directive? No - Patient declined  Copy of [place] in Chart? No - copy requested  Would patient like information on creating a medical advance directive? No - Patient declined     Chief Complaint  Patient presents with   Acute Visit    Follow up from hospital visit   Immunizations    Discussed the need for Tetanus and Covid Vaccine NCIR verified   Quality Metric Gaps    Discussed the need for AWV    HPI:  Pt is a 89 y.o. female seen today for an acute visit for follow up  after an ER visit and hospital admission. She resides in independent living at [facility] with some home care support. Now in skilled rehab for additional help with pain management and functional status.   PMH significant for memory loss, HTN, afib, palpitations breast ca, hyponatremia, scoliosis, OP, hypercholesterolemia.    She was seen in the ER on 01/08/22 due to a mechanical fall with rib pain. She was found to have minimally displaced 6th, 10th, 11th rib fractures, minimal bibasilar atx vs scarring, and aortic atherosclerosis.   She was prescribed ultram, lidocaine patch,  and an IS.  Nurse reports she has been in a significant amt of pain.   HR in the ER was 49 on 12 lead EKG with incomplete RBBB. Today HR is ranging 50-70s. She denies any sob or palpitations.   CT of the head was obtained 01/08/22 which showed no acute findings. Left thyroid nodule noted. Has chronic microvascular ischemic changes   Past Medical History:  Diagnosis Date   Acute upper respiratory infections of unspecified site 06/09/2011   Aphasia    Breast cancer ([place]) 12/11/2008   Encounter for general adult medical examination without abnormal findings    Encounter for immunization    Fatigue 01/2012   Hypercholesterolemia    on lipitor   Hypertension    Hyponatremia 01/2016   Impacted cerumen, bilateral    Memory loss    Mild cognitive impairment of uncertain or unknown etiology    Mitral valve prolapse    Nocturia    Other abnormal blood chemistry 01/21/2010   Palpitations 08/05/2000   Pure hypercholesterolemia, unspecified    Right bundle branch block    Sacrococcygeal disorders, not  elsewhere classified    Scoliosis    Senile osteoporosis 09/28/2001   Unilateral primary osteoarthritis, left hip    Unspecified dementia, unspecified severity, without behavioral disturbance, psychotic disturbance, mood disturbance, and anxiety (HCC)    Unspecified hearing loss, bilateral    Unsteadiness on feet     Vascular dementia, unspecified severity, without behavioral disturbance, psychotic disturbance, mood disturbance, and anxiety ([place])    Past Surgical History:  Procedure Laterality Date   BREAST LUMPECTOMY Right 12/27/2007   LAPAROSCOPIC LYSIS OF ADHESIONS  07/15/2006   [name]   OTHER SURGICAL HISTORY     TONSILLECTOMY     TUBAL LIGATION  1963    No Known Allergies  Outpatient Encounter Medications as of 01/09/2022  Medication Sig   acetaminophen (TYLENOL) 325 MG tablet Take 650 mg by mouth every 4 (four) hours as needed for mild pain. PO every 4 hours PRN pain for up to 72 hours. Document area of discomfort and effectiveness of medication.   atenolol (TENORMIN) 25 MG tablet Take 1/2 tab daily   Cholecalciferol (VITAMIN D3) 25 MCG (1000 UT) CAPS Take 1 capsule (1,000 Units total) by mouth daily.   COVID-19 mRNA vaccine 2023-2024 (COMIRNATY) syringe Inject into the muscle.   influenza vaccine adjuvanted (FLUAD QUADRIVALENT) 0.5 ML injection Inject into the muscle.   lidocaine 4 % Place 1 patch onto the skin in the morning and at bedtime. 4%, transdermal, Twice A Day, Lidocaine 4% patch: apply to affected area in AM and remove in PM.   polyethylene glycol (MIRALAX / GLYCOLAX) 17 g packet Take 17 g by mouth daily as needed for mild constipation. Take 17 gram daily in 6-8 oz of beverage of choice daily as needed for mild constipation.   traMADol (ULTRAM) 50 MG tablet Take 50 mg by mouth every 6 (six) hours as needed for moderate pain.   No facility-administered encounter medications on file as of 01/09/2022.    Review of Systems  Constitutional:  Positive for activity change. Negative for appetite change, chills, diaphoresis, fatigue, fever and unexpected weight change.  HENT:  Negative for congestion.   Respiratory:  Negative for cough, shortness of breath and wheezing.   Cardiovascular:  Positive for chest pain (right rib pain). Negative for palpitations and leg swelling.   Gastrointestinal:  Negative for abdominal distention, abdominal pain, constipation and diarrhea.  Genitourinary:  Negative for difficulty urinating and dysuria.  Musculoskeletal:  Positive for gait problem (recent fall). Negative for arthralgias, back pain, joint swelling and myalgias.  Neurological:  Negative for dizziness, tremors, seizures, syncope, facial asymmetry, speech difficulty, weakness, light-headedness, numbness and headaches.  Psychiatric/Behavioral:  Positive for confusion. Negative for agitation and behavioral problems.     Immunization History  Administered Date(s) Administered   COVID-19, mRNA, vaccine(Comirnaty)12 years and older 10/30/2021   Fluad Quad(high Dose 65+) 10/08/2021   Influenza Split 10/23/2011   Influenza, High Dose Seasonal PF 09/20/2015, 10/09/2016, 10/15/2018   Influenza,inj,Quad PF,6+ Mos 10/28/2013, 10/30/2017   Influenza-Unspecified 10/06/2012, 10/26/2014   Moderna Sars-Covid-2 Vaccination 01/16/2019, 02/15/2019, 11/22/2019, 10/19/2020   Pneumococcal Conjugate-13 02/14/2014   Pneumococcal Polysaccharide-23 01/06/1997   Tetanus 05/13/2017   Zoster Recombinat (Shingrix) 10/09/2016, 09/17/2017, 01/14/2018   Zoster, Live 01/07/1999   Pertinent  Health Maintenance Due  Topic Date Due   INFLUENZA VACCINE  Completed   DEXA SCAN  Completed      03/21/2021   10:10 PM 06/21/2021    4:09 PM 07/14/2021    2:12 AM 07/14/2021    2:50 PM  01/08/2022   12:07 PM  Fall Risk  Falls in the past year?  0     Was there an injury with Fall?  0     Fall Risk Category Calculator  0     Fall Risk Category  Low     Patient Fall Risk Level Low fall risk Low fall risk Low fall risk Low fall risk High fall risk  Patient at Risk for Falls Due to  Impaired balance/gait     Fall risk Follow up  Falls evaluation completed      Functional Status Survey:    Vitals:   01/09/22 0857  BP: (!) 156/61  Pulse: 72  Resp: 15  Temp: (!) 97.2 F (36.2 C)  TempSrc: Temporal   SpO2: 94%  Weight: 110 lb (49.9 kg)  Height: '5\' 1"'$  (1.549 m)   Body mass index is 20.78 kg/m. Physical Exam Vitals and nursing note reviewed.  Constitutional:      General: She is not in acute distress.    Appearance: She is not diaphoretic.  HENT:     Head: Normocephalic and atraumatic.     Right Ear: There is impacted cerumen.     Left Ear: There is no impacted cerumen.     Nose: No congestion.     Mouth/Throat:     Mouth: Mucous membranes are moist.     Pharynx: Oropharynx is clear.  Eyes:     Conjunctiva/sclera: Conjunctivae normal.     Pupils: Pupils are equal, round, and reactive to light.  Neck:     Vascular: No JVD.  Cardiovascular:     Rate and Rhythm: Regular rhythm. Bradycardia present.     Heart sounds: No murmur heard. Pulmonary:     Effort: Pulmonary effort is normal. No respiratory distress.     Breath sounds: Rales (slight right base.) present. No wheezing.  Chest:     Chest wall: Tenderness (right rib cage, no crepitus.) present.  Abdominal:     General: Abdomen is flat. Bowel sounds are normal.     Palpations: Abdomen is soft.  Musculoskeletal:        General: No swelling or deformity.     Right lower leg: No edema.     Left lower leg: No edema.     Comments: Rotated bilateral hips, knees, shoulders and elbows with no pain elicited.   Skin:    General: Skin is warm and dry.  Neurological:     General: No focal deficit present.     Mental Status: She is alert. Mental status is at baseline.  Psychiatric:        Mood and Affect: Mood normal.     Labs reviewed: Recent Labs    03/21/21 2220 06/18/21 0000 07/14/21 0242 07/18/21 0000 01/08/22 1212  NA 135   < > 133* 135* 135  K 3.7   < > 3.6 4.4 4.5  CL 99   < > 100 100 101  CO2 27   < > 23 23* 27  GLUCOSE 162*  --  104*  --  109*  BUN 23   < > '19 17 21  '$ CREATININE 0.81   < > 0.96 0.8 0.96  CALCIUM 10.7*   < > 9.0 9.4 9.9  MG 2.1  --   --   --   --    < > = values in this interval not  displayed.   Recent Labs    06/18/21 0000 07/18/21 0000  AST 15  16  ALT 8 9  ALKPHOS 70 68  ALBUMIN  --  4.3   Recent Labs    03/21/21 2220 06/18/21 0000 07/14/21 0242 07/18/21 0000 01/08/22 1212  WBC 7.4   < > 6.0 6.9 7.8  NEUTROABS  --   --  3.6  --  4.8  HGB 14.5   < > 13.6 14.3 14.1  HCT 42.8   < > 39.7 42 42.2  MCV 95.7  --  95.7  --  97.5  PLT 299   < > 276 325 279   < > = values in this interval not displayed.   Lab Results  Component Value Date   TSH 2.15 07/18/2021   Lab Results  Component Value Date   HGBA1C 5.9 (H) 01/23/2016   Lab Results  Component Value Date   CHOL 369 (A) 08/28/2020   HDL 51 08/28/2020   LDLCALC 284 08/28/2020   TRIG 170 (A) 08/28/2020   CHOLHDL 3.5 01/27/2016    Significant Diagnostic Results in last 30 days:  CT Chest Wo Contrast  Addendum Date: 01/08/2022   ADDENDUM REPORT: 01/08/2022 14:48 ADDENDUM: There may be a minimally displaced fracture involving the anterior portion of the right eighth rib. Electronically Signed   By: Marijo Conception M.D.   On: 01/08/2022 14:48   Result Date: 01/08/2022 CLINICAL DATA:  Chest pain after fall. EXAM: CT CHEST WITHOUT CONTRAST TECHNIQUE: Multidetector CT imaging of the chest was performed following the standard protocol without IV contrast. RADIATION DOSE REDUCTION: This exam was performed according to the departmental dose-optimization program which includes automated exposure control, adjustment of the mA and/or kV according to patient size and/or use of iterative reconstruction technique. COMPARISON:  July 14, 2021. FINDINGS: Cardiovascular: Atherosclerosis of thoracic aorta is noted without aneurysm formation. Mild cardiomegaly. No pericardial effusion. Minimal coronary artery calcifications are noted. Mediastinum/Nodes: No enlarged mediastinal or axillary lymph nodes. Thyroid gland, trachea, and esophagus demonstrate no significant findings. Lungs/Pleura: No pneumothorax or pleural effusion is  noted. Minimal bibasilar subsegmental atelectasis or scarring is noted. Mild biapical scarring is noted. Upper Abdomen: No acute abnormality. Musculoskeletal: Minimally displaced fractures are seen involving the right sixth, tenth and eleventh ribs. IMPRESSION: Minimal bibasilar subsegmental atelectasis or scarring. Minimal coronary artery calcifications are noted. Minimally displaced right sixth, tenth and eleventh rib fractures. Aortic Atherosclerosis (ICD10-I70.0). Electronically Signed: By: Marijo Conception M.D. On: 01/08/2022 14:36   DG Ribs Unilateral W/Chest Right  Result Date: 01/08/2022 CLINICAL DATA:  right rib pain status post fall EXAM: RIGHT RIBS AND CHEST - 3+ VIEW COMPARISON:  CT 07/14/2021 FINDINGS: Unchanged cardiomediastinal silhouette. There is no focal airspace consolidation. There is no pleural effusion or evidence of pneumothorax. There are minimally displaced right anterolateral sixth, seventh, and eighth rib fractures. IMPRESSION: Minimally displaced right anterolateral sixth, seventh, and eighth rib fractures. No acute cardiopulmonary disease. Electronically Signed   By: Maurine Simmering M.D.   On: 01/08/2022 12:47   CT Head Wo Contrast  Result Date: 01/08/2022 CLINICAL DATA:  Trauma EXAM: CT HEAD WITHOUT CONTRAST CT CERVICAL SPINE WITHOUT CONTRAST TECHNIQUE: Multidetector CT imaging of the head and cervical spine was performed following the standard protocol without intravenous contrast. Multiplanar CT image reconstructions of the cervical spine were also generated. RADIATION DOSE REDUCTION: This exam was performed according to the departmental dose-optimization program which includes automated exposure control, adjustment of the mA and/or kV according to patient size and/or use of iterative reconstruction technique. COMPARISON:  CT Head 07/12/20 FINDINGS:  CT HEAD FINDINGS Brain: No evidence of acute infarction, hemorrhage, hydrocephalus, extra-axial collection or mass lesion/mass effect.  Sequela of mild chronic microvascular ischemic change. Vascular: No hyperdense vessel or unexpected calcification. Skull: Normal. Negative for fracture or focal lesion. Sinuses/Orbits: Bilateral lens replacement. Paranasal sinuses and mastoid air cells are clear. No middle ear effusion. Other: None. CT CERVICAL SPINE FINDINGS Alignment: Trace retrolisthesis of C4 on C5. Skull base and vertebrae: No acute fracture. No primary bone lesion or focal pathologic process. There is fusion of the C2-C3 facet joints on the left. Soft tissues and spinal canal: No prevertebral fluid or swelling. No visible canal hematoma. Disc levels:  No evidence of high-grade spinal canal stenosis. Upper chest: Negative. Other: 1.2 cm left thyroid nodule. IMPRESSION: 1. No acute intracranial abnormality. 2. No acute cervical spine fracture. Electronically Signed   By: Marin Roberts M.D.   On: 01/08/2022 12:43   CT Cervical Spine Wo Contrast  Result Date: 01/08/2022 CLINICAL DATA:  Trauma EXAM: CT HEAD WITHOUT CONTRAST CT CERVICAL SPINE WITHOUT CONTRAST TECHNIQUE: Multidetector CT imaging of the head and cervical spine was performed following the standard protocol without intravenous contrast. Multiplanar CT image reconstructions of the cervical spine were also generated. RADIATION DOSE REDUCTION: This exam was performed according to the departmental dose-optimization program which includes automated exposure control, adjustment of the mA and/or kV according to patient size and/or use of iterative reconstruction technique. COMPARISON:  CT Head 07/12/20 FINDINGS: CT HEAD FINDINGS Brain: No evidence of acute infarction, hemorrhage, hydrocephalus, extra-axial collection or mass lesion/mass effect. Sequela of mild chronic microvascular ischemic change. Vascular: No hyperdense vessel or unexpected calcification. Skull: Normal. Negative for fracture or focal lesion. Sinuses/Orbits: Bilateral lens replacement. Paranasal sinuses and mastoid air cells  are clear. No middle ear effusion. Other: None. CT CERVICAL SPINE FINDINGS Alignment: Trace retrolisthesis of C4 on C5. Skull base and vertebrae: No acute fracture. No primary bone lesion or focal pathologic process. There is fusion of the C2-C3 facet joints on the left. Soft tissues and spinal canal: No prevertebral fluid or swelling. No visible canal hematoma. Disc levels:  No evidence of high-grade spinal canal stenosis. Upper chest: Negative. Other: 1.2 cm left thyroid nodule. IMPRESSION: 1. No acute intracranial abnormality. 2. No acute cervical spine fracture. Electronically Signed   By: Marin Roberts M.D.   On: 01/08/2022 12:43    Assessment/Plan  1. Closed fracture of multiple ribs of right side, initial encounter IS q 2 while awake Still having significant pain will add scheduled tylenol and extend ultram order. Keep lidocaine patch. Add prn robaxin.  She will like need to stay a few days to help with pain management.  PT OT ordered.  - acetaminophen (TYLENOL) 325 MG tablet; Take 2 tablets (650 mg total) by mouth in the morning, at noon, and at bedtime.  Dispense: 30 tablet; Refill: 0 - methocarbamol (ROBAXIN) 500 MG tablet; Take 1 tablet (500 mg total) by mouth 3 (three) times daily as needed for muscle spasms.  Dispense: 30 tablet; Refill: 0 - traMADol (ULTRAM) 50 MG tablet; Take 1 tablet (50 mg total) by mouth 3 (three) times daily as needed for up to 14 days for moderate pain.  Dispense: 30 tablet; Refill: 0  2. Impacted cerumen of right ear Right cerumen impaction protocol with debrox   3. PAF (paroxysmal atrial fibrillation) (HCC) HR running low not sure if this contributed to her fall Will hold atenolol if <60.  Staff to monitor pulse qshift to gain insight may  need to adjust atenolol to 1/4 tab and can reach to cardiology if needed Anticoagulation deferred per cardiology   4. Right bundle branch block Noted on EKG   5. Essential hypertension, benign Controlled.   6.  Bilateral hearing loss, unspecified hearing loss type Noted worsening over time, likely due to comprehension issues.   7. MCI (mild cognitive impairment) Will obtain new MMSE and compare to prior scoring  Has declined in the past year cognitively and is now using med management at [facility] and home care for house hold duties.    Family/ staff Communication: Irine Seal son urologist  Labs/tests ordered:  NA

## 2022-01-13 ENCOUNTER — Encounter: Payer: Self-pay | Admitting: Adult Health

## 2022-01-13 ENCOUNTER — Encounter: Payer: Self-pay | Admitting: Internal Medicine

## 2022-01-13 ENCOUNTER — Non-Acute Institutional Stay (SKILLED_NURSING_FACILITY): Payer: Medicare Other | Admitting: Internal Medicine

## 2022-01-13 DIAGNOSIS — R2689 Other abnormalities of gait and mobility: Secondary | ICD-10-CM | POA: Diagnosis not present

## 2022-01-13 DIAGNOSIS — S2241XD Multiple fractures of ribs, right side, subsequent encounter for fracture with routine healing: Secondary | ICD-10-CM

## 2022-01-13 DIAGNOSIS — S2241XS Multiple fractures of ribs, right side, sequela: Secondary | ICD-10-CM | POA: Diagnosis not present

## 2022-01-13 DIAGNOSIS — R278 Other lack of coordination: Secondary | ICD-10-CM | POA: Diagnosis not present

## 2022-01-13 DIAGNOSIS — M2559 Pain in other specified joint: Secondary | ICD-10-CM | POA: Diagnosis not present

## 2022-01-13 DIAGNOSIS — I480 Paroxysmal atrial fibrillation: Secondary | ICD-10-CM | POA: Diagnosis not present

## 2022-01-13 DIAGNOSIS — G3184 Mild cognitive impairment, so stated: Secondary | ICD-10-CM | POA: Diagnosis not present

## 2022-01-13 DIAGNOSIS — Z9181 History of falling: Secondary | ICD-10-CM | POA: Diagnosis not present

## 2022-01-13 DIAGNOSIS — I10 Essential (primary) hypertension: Secondary | ICD-10-CM

## 2022-01-13 DIAGNOSIS — R2681 Unsteadiness on feet: Secondary | ICD-10-CM | POA: Diagnosis not present

## 2022-01-13 NOTE — Progress Notes (Signed)
.  pscThis encounter was created in error - please disregard.

## 2022-01-13 NOTE — Progress Notes (Signed)
Provider:   Location:  [facility] Room Number: 154A Place of Service:  SNF (31)  PCP: Veleta Miners MD  Royal Hawthorn, NP Patient Care Team: Royal Hawthorn, NP as PCP - General (Nurse Practitioner) [place]ique, Peter M, MD as PCP - Cardiology (Cardiology) Prentiss Bells, MD as Consulting Physician (Ophthalmology) [place], Peter M, MD as Consulting Physician (Cardiology) Allyn Kenner, MD as Consulting Physician (Dermatology) Michael Boston, MD as Consulting Physician (General Surgery) Community, Well Loreli Slot, MD as Consulting Physician (Hematology and Oncology) Magrinat, Virgie Dad, MD (Inactive) as Consulting Physician (Oncology) [name], MD as Consulting Physician (Ophthalmology)  Extended Emergency Contact Information Primary Emergency Contact: Gertha Calkin of [place] Phone: [phone] Work Phone: [phone] Mobile Phone: [phone] Relation: Son Secondary Emergency Contact: Joesphine Bare of [place] Phone: [phone] Mobile Phone: [phone] Relation: Son  Code Status: DNR Goals of Care: Advanced Directive information    01/13/2022    8:58 AM  Advanced Directives  Does Patient Have a Medical Advance Directive? Yes  Type of Advance Directive Out of facility DNR (pink MOST or yellow form);Healthcare Power of Attorney  Does patient want to make changes to medical advance directive? No - Patient declined  Copy of [place] in Chart? No - copy requested  Would patient like information on creating a medical advance directive? No - Patient declined      Chief Complaint  Patient presents with   New Admit To SNF    Patient is new admission to SNF   Immunizations    Discussed the need for tetanus and updated covid   Quality Metric Gaps    Patient is due for AWv    HPI: Patient is a 89 y.o. female seen today for admission to after fall  [place]  Has h/o Hyponatremia, Palpitations/PAF, Osteoporosis, Hyperlipidemia, MCI   Patient fell not sure how but fell in the hallway Was seen in ED was found to have Displaced 6,10,11 Rib Fracture  HR was 49 Ct head showed no acute findings    Past Medical History:  Diagnosis Date   Acute upper respiratory infections of unspecified site 06/09/2011   Aphasia    Breast cancer ([place]) 12/11/2008   Encounter for general adult medical examination without abnormal findings    Encounter for immunization    Fatigue 01/2012   Hypercholesterolemia    on lipitor   Hypertension    Hyponatremia 01/2016   Impacted cerumen, bilateral    Memory loss    Mild cognitive impairment of uncertain or unknown etiology    Mitral valve prolapse    Nocturia    Other abnormal blood chemistry 01/21/2010   Palpitations 08/05/2000   Pure hypercholesterolemia, unspecified    Right bundle branch block    Sacrococcygeal disorders, not elsewhere classified    Scoliosis    Senile osteoporosis 09/28/2001   Unilateral primary osteoarthritis, left hip    Unspecified dementia, unspecified severity, without behavioral disturbance, psychotic disturbance, mood disturbance, and anxiety ([place])    Unspecified hearing loss, bilateral    Unsteadiness on feet    Vascular dementia, unspecified severity, without behavioral disturbance, psychotic disturbance, mood disturbance, and anxiety ([place])    Past Surgical History:  Procedure Laterality Date   BREAST LUMPECTOMY Right 12/27/2007   LAPAROSCOPIC LYSIS OF ADHESIONS  07/15/2006   [name]   OTHER SURGICAL HISTORY     TONSILLECTOMY     TUBAL LIGATION  1963  reports that she quit smoking about 67 years ago. Her smoking use included cigarettes. She has a 2.50 pack-year smoking history. She has never used smokeless tobacco. She reports that she does not currently use alcohol. She reports that she does not use drugs. Social History   Socioeconomic History    Marital status: Widowed    Spouse name: Not on file   Number of children: 3   Years of education: Not on file   Highest education level: Not on file  Occupational History   Occupation: Retired Product manager: RETIRED  Tobacco Use   Smoking status: Former    Packs/day: 0.25    Years: 10.00    Total pack years: 2.50    Types: Cigarettes    Quit date: 08/06/1954    Years since quitting: 67.4   Smokeless tobacco: Never  Vaping Use   Vaping Use: Never used  Substance and Sexual Activity   Alcohol use: Not Currently    Comment: Occasionally    Drug use: No   Sexual activity: Never  Other Topics Concern   Not on file  Social History Narrative   Lives at [facility] since 05/1998   Widowed   Living will   Former smoker, stopped 1956   Alcohol  Rare   Exercise - walk one hour daily 7 days a week,  3 days of stretching and strengthen, yoga 45 minutes 2 days a week   Social Determinants of Health   Financial Resource Strain: Low Risk  (09/15/2017)   Overall Financial Resource Strain (CARDIA)    Difficulty of Paying Living Expenses: Not hard at all  Food Insecurity: No Food Insecurity (09/15/2017)   Hunger Vital Sign    Worried About Running Out of Food in the Last Year: Never true    [place] in the Last Year: Never true  Transportation Needs: No Transportation Needs (09/15/2017)   PRAPARE - Hydrologist (Medical): No    Lack of Transportation (Non-Medical): No  Physical Activity: Sufficiently Active (09/15/2017)   Exercise Vital Sign    Days of Exercise per Week: 7 days    Minutes of Exercise per Session: 120 min  Stress: No Stress Concern Present (09/15/2017)   [place]    Feeling of Stress : Not at all  Social Connections: Moderately Isolated (09/15/2017)   Social Connection and Isolation Panel [NHANES]    Frequency of Communication with Friends and Family: More than  three times a week    Frequency of Social Gatherings with Friends and Family: More than three times a week    Attends Religious Services: Never    Marine scientist or Organizations: No    Attends Archivist Meetings: Never    Marital Status: Widowed  Intimate Partner Violence: Not At Risk (09/15/2017)   Humiliation, Afraid, Rape, and Kick questionnaire    Fear of Current or Ex-Partner: No    Emotionally Abused: No    Physically Abused: No    Sexually Abused: No    Functional Status Survey:    Family History  Problem Relation Age of Onset   Stroke Mother    Cancer Father        colon   Alzheimer's disease Sister     Health Maintenance  Topic Date Due   DTaP/Tdap/Td (1 - Tdap) 05/14/2017   Medicare Annual Wellness (AWV)  11/20/2021   COVID-19 Vaccine (6 -  2023-24 season) 12/25/2021   Pneumonia Vaccine 42+ Years old  Completed   INFLUENZA VACCINE  Completed   DEXA SCAN  Completed   Zoster Vaccines- Shingrix  Completed   HPV VACCINES  Aged Out    No Known Allergies  Outpatient Encounter Medications as of 01/13/2022  Medication Sig   acetaminophen (TYLENOL) 325 MG tablet Take 2 tablets (650 mg total) by mouth in the morning, at noon, and at bedtime.   atenolol (TENORMIN) 25 MG tablet Take 1/2 tab daily   Cholecalciferol (VITAMIN D3) 25 MCG (1000 UT) CAPS Take 1 capsule (1,000 Units total) by mouth daily.   lidocaine 4 % Place 1 patch onto the skin in the morning and at bedtime. 4%, transdermal, Twice A Day, Lidocaine 4% patch: apply to affected area in AM and remove in PM.   methocarbamol (ROBAXIN) 500 MG tablet Take 1 tablet (500 mg total) by mouth 3 (three) times daily as needed for muscle spasms.   polyethylene glycol (MIRALAX / GLYCOLAX) 17 g packet Take 17 g by mouth daily as needed for mild constipation. Take 17 gram daily in 6-8 oz of beverage of choice daily as needed for mild constipation.   traMADol (ULTRAM) 50 MG tablet Take 1 tablet (50 mg total) by  mouth 3 (three) times daily as needed for up to 14 days for moderate pain.   No facility-administered encounter medications on file as of 01/13/2022.    Review of Systems  Unable to perform ROS: Dementia    Vitals:   01/13/22 0856  BP: 132/76  Pulse: 71  Resp: 20  Temp: 97.7 F (36.5 C)  TempSrc: Temporal  SpO2: 94%  Weight: 110 lb (49.9 kg)  Height: '5\' 1"'$  (1.549 m)   Body mass index is 20.78 kg/m. Physical Exam Vitals reviewed.  Constitutional:      Appearance: Normal appearance.  HENT:     Head: Normocephalic.     Nose: Nose normal.     Mouth/Throat:     Mouth: Mucous membranes are moist.     Pharynx: Oropharynx is clear.  Eyes:     Pupils: Pupils are equal, round, and reactive to light.  Cardiovascular:     Rate and Rhythm: Normal rate and regular rhythm.     Pulses: Normal pulses.     Heart sounds: Normal heart sounds. No murmur heard. Pulmonary:     Effort: Pulmonary effort is normal.     Breath sounds: Normal breath sounds.  Abdominal:     General: Abdomen is flat. Bowel sounds are normal.     Palpations: Abdomen is soft.  Musculoskeletal:        General: No swelling.     Cervical back: Neck supple.  Skin:    General: Skin is warm.  Neurological:     General: No focal deficit present.     Mental Status: She is alert.  Psychiatric:        Mood and Affect: Mood normal.        Thought Content: Thought content normal.     Labs reviewed: Basic Metabolic Panel: Recent Labs    03/21/21 2220 06/18/21 0000 07/14/21 0242 07/18/21 0000 01/08/22 1212  NA 135   < > 133* 135* 135  K 3.7   < > 3.6 4.4 4.5  CL 99   < > 100 100 101  CO2 27   < > 23 23* 27  GLUCOSE 162*  --  104*  --  109*  BUN 23   < >  $'19 17 21  'c$ CREATININE 0.81   < > 0.96 0.8 0.96  CALCIUM 10.7*   < > 9.0 9.4 9.9  MG 2.1  --   --   --   --    < > = values in this interval not displayed.   Liver Function Tests: Recent Labs    06/18/21 0000 07/18/21 0000  AST 15 16  ALT 8 9   ALKPHOS 70 68  ALBUMIN  --  4.3   No results for input(s): "LIPASE", "AMYLASE" in the last 8760 hours. No results for input(s): "AMMONIA" in the last 8760 hours. CBC: Recent Labs    03/21/21 2220 06/18/21 0000 07/14/21 0242 07/18/21 0000 01/08/22 1212  WBC 7.4   < > 6.0 6.9 7.8  NEUTROABS  --   --  3.6  --  4.8  HGB 14.5   < > 13.6 14.3 14.1  HCT 42.8   < > 39.7 42 42.2  MCV 95.7  --  95.7  --  97.5  PLT 299   < > 276 325 279   < > = values in this interval not displayed.   Cardiac Enzymes: No results for input(s): "CKTOTAL", "CKMB", "CKMBINDEX", "TROPONINI" in the last 8760 hours. BNP: Invalid input(s): "POCBNP" Lab Results  Component Value Date   HGBA1C 5.9 (H) 01/23/2016   Lab Results  Component Value Date   TSH 2.15 07/18/2021   Lab Results  Component Value Date   [id number] 07/18/2021   No results found for: "FOLATE" No results found for: "IRON", "TIBC", "FERRITIN"  Imaging and Procedures obtained prior to SNF admission: CT Chest Wo Contrast  Addendum Date: 01/08/2022   ADDENDUM REPORT: 01/08/2022 14:48 ADDENDUM: There may be a minimally displaced fracture involving the anterior portion of the right eighth rib. Electronically Signed   By: Marijo Conception M.D.   On: 01/08/2022 14:48   Result Date: 01/08/2022 CLINICAL DATA:  Chest pain after fall. EXAM: CT CHEST WITHOUT CONTRAST TECHNIQUE: Multidetector CT imaging of the chest was performed following the standard protocol without IV contrast. RADIATION DOSE REDUCTION: This exam was performed according to the departmental dose-optimization program which includes automated exposure control, adjustment of the mA and/or kV according to patient size and/or use of iterative reconstruction technique. COMPARISON:  July 14, 2021. FINDINGS: Cardiovascular: Atherosclerosis of thoracic aorta is noted without aneurysm formation. Mild cardiomegaly. No pericardial effusion. Minimal coronary artery calcifications are noted.  Mediastinum/Nodes: No enlarged mediastinal or axillary lymph nodes. Thyroid gland, trachea, and esophagus demonstrate no significant findings. Lungs/Pleura: No pneumothorax or pleural effusion is noted. Minimal bibasilar subsegmental atelectasis or scarring is noted. Mild biapical scarring is noted. Upper Abdomen: No acute abnormality. Musculoskeletal: Minimally displaced fractures are seen involving the right sixth, tenth and eleventh ribs. IMPRESSION: Minimal bibasilar subsegmental atelectasis or scarring. Minimal coronary artery calcifications are noted. Minimally displaced right sixth, tenth and eleventh rib fractures. Aortic Atherosclerosis (ICD10-I70.0). Electronically Signed: By: Marijo Conception M.D. On: 01/08/2022 14:36   DG Ribs Unilateral W/Chest Right  Result Date: 01/08/2022 CLINICAL DATA:  right rib pain status post fall EXAM: RIGHT RIBS AND CHEST - 3+ VIEW COMPARISON:  CT 07/14/2021 FINDINGS: Unchanged cardiomediastinal silhouette. There is no focal airspace consolidation. There is no pleural effusion or evidence of pneumothorax. There are minimally displaced right anterolateral sixth, seventh, and eighth rib fractures. IMPRESSION: Minimally displaced right anterolateral sixth, seventh, and eighth rib fractures. No acute cardiopulmonary disease. Electronically Signed   By: Ileene Patrick.D.  On: 01/08/2022 12:47   CT Head Wo Contrast  Result Date: 01/08/2022 CLINICAL DATA:  Trauma EXAM: CT HEAD WITHOUT CONTRAST CT CERVICAL SPINE WITHOUT CONTRAST TECHNIQUE: Multidetector CT imaging of the head and cervical spine was performed following the standard protocol without intravenous contrast. Multiplanar CT image reconstructions of the cervical spine were also generated. RADIATION DOSE REDUCTION: This exam was performed according to the departmental dose-optimization program which includes automated exposure control, adjustment of the mA and/or kV according to patient size and/or use of iterative  reconstruction technique. COMPARISON:  CT Head 07/12/20 FINDINGS: CT HEAD FINDINGS Brain: No evidence of acute infarction, hemorrhage, hydrocephalus, extra-axial collection or mass lesion/mass effect. Sequela of mild chronic microvascular ischemic change. Vascular: No hyperdense vessel or unexpected calcification. Skull: Normal. Negative for fracture or focal lesion. Sinuses/Orbits: Bilateral lens replacement. Paranasal sinuses and mastoid air cells are clear. No middle ear effusion. Other: None. CT CERVICAL SPINE FINDINGS Alignment: Trace retrolisthesis of C4 on C5. Skull base and vertebrae: No acute fracture. No primary bone lesion or focal pathologic process. There is fusion of the C2-C3 facet joints on the left. Soft tissues and spinal canal: No prevertebral fluid or swelling. No visible canal hematoma. Disc levels:  No evidence of high-grade spinal canal stenosis. Upper chest: Negative. Other: 1.2 cm left thyroid nodule. IMPRESSION: 1. No acute intracranial abnormality. 2. No acute cervical spine fracture. Electronically Signed   By: Marin Roberts M.D.   On: 01/08/2022 12:43   CT Cervical Spine Wo Contrast  Result Date: 01/08/2022 CLINICAL DATA:  Trauma EXAM: CT HEAD WITHOUT CONTRAST CT CERVICAL SPINE WITHOUT CONTRAST TECHNIQUE: Multidetector CT imaging of the head and cervical spine was performed following the standard protocol without intravenous contrast. Multiplanar CT image reconstructions of the cervical spine were also generated. RADIATION DOSE REDUCTION: This exam was performed according to the departmental dose-optimization program which includes automated exposure control, adjustment of the mA and/or kV according to patient size and/or use of iterative reconstruction technique. COMPARISON:  CT Head 07/12/20 FINDINGS: CT HEAD FINDINGS Brain: No evidence of acute infarction, hemorrhage, hydrocephalus, extra-axial collection or mass lesion/mass effect. Sequela of mild chronic microvascular ischemic  change. Vascular: No hyperdense vessel or unexpected calcification. Skull: Normal. Negative for fracture or focal lesion. Sinuses/Orbits: Bilateral lens replacement. Paranasal sinuses and mastoid air cells are clear. No middle ear effusion. Other: None. CT CERVICAL SPINE FINDINGS Alignment: Trace retrolisthesis of C4 on C5. Skull base and vertebrae: No acute fracture. No primary bone lesion or focal pathologic process. There is fusion of the C2-C3 facet joints on the left. Soft tissues and spinal canal: No prevertebral fluid or swelling. No visible canal hematoma. Disc levels:  No evidence of high-grade spinal canal stenosis. Upper chest: Negative. Other: 1.2 cm left thyroid nodule. IMPRESSION: 1. No acute intracranial abnormality. 2. No acute cervical spine fracture. Electronically Signed   By: Marin Roberts M.D.   On: 01/08/2022 12:43    Assessment/Plan 1. Closed fracture of multiple ribs of right side with routine healing, subsequent encounter [facility] Tylenol seemed to be working  Will discontinue Tramadol and Change Robaxin to 250 mg Q8 PRN AS Tramadol was causing Confusion  2. PAF (paroxysmal atrial fibrillation) (HCC) No DOCA due to age and frailty On Low dose of Atenolol to keep HR control  3. Essential hypertension, benign Controlled  4. MCI (mild cognitive impairment) Have been more confused here Has started working with therapy    Family/ staff Communication:   Labs/tests ordered:

## 2022-01-14 DIAGNOSIS — R2681 Unsteadiness on feet: Secondary | ICD-10-CM | POA: Diagnosis not present

## 2022-01-14 DIAGNOSIS — M6389 Disorders of muscle in diseases classified elsewhere, multiple sites: Secondary | ICD-10-CM | POA: Diagnosis not present

## 2022-01-14 DIAGNOSIS — R2689 Other abnormalities of gait and mobility: Secondary | ICD-10-CM | POA: Diagnosis not present

## 2022-01-14 DIAGNOSIS — M2559 Pain in other specified joint: Secondary | ICD-10-CM | POA: Diagnosis not present

## 2022-01-14 DIAGNOSIS — Z9181 History of falling: Secondary | ICD-10-CM | POA: Diagnosis not present

## 2022-01-14 DIAGNOSIS — R278 Other lack of coordination: Secondary | ICD-10-CM | POA: Diagnosis not present

## 2022-01-14 DIAGNOSIS — S2241XS Multiple fractures of ribs, right side, sequela: Secondary | ICD-10-CM | POA: Diagnosis not present

## 2022-01-15 DIAGNOSIS — M2559 Pain in other specified joint: Secondary | ICD-10-CM | POA: Diagnosis not present

## 2022-01-15 DIAGNOSIS — S2241XS Multiple fractures of ribs, right side, sequela: Secondary | ICD-10-CM | POA: Diagnosis not present

## 2022-01-15 DIAGNOSIS — R278 Other lack of coordination: Secondary | ICD-10-CM | POA: Diagnosis not present

## 2022-01-15 DIAGNOSIS — M6389 Disorders of muscle in diseases classified elsewhere, multiple sites: Secondary | ICD-10-CM | POA: Diagnosis not present

## 2022-01-15 DIAGNOSIS — Z9181 History of falling: Secondary | ICD-10-CM | POA: Diagnosis not present

## 2022-01-15 DIAGNOSIS — G9341 Metabolic encephalopathy: Secondary | ICD-10-CM | POA: Diagnosis not present

## 2022-01-15 DIAGNOSIS — F01A0 Vascular dementia, mild, without behavioral disturbance, psychotic disturbance, mood disturbance, and anxiety: Secondary | ICD-10-CM | POA: Diagnosis not present

## 2022-01-16 ENCOUNTER — Encounter: Payer: Self-pay | Admitting: Adult Health

## 2022-01-16 ENCOUNTER — Non-Acute Institutional Stay (SKILLED_NURSING_FACILITY): Payer: Medicare Other | Admitting: Adult Health

## 2022-01-16 DIAGNOSIS — Z9181 History of falling: Secondary | ICD-10-CM | POA: Diagnosis not present

## 2022-01-16 DIAGNOSIS — S2241XS Multiple fractures of ribs, right side, sequela: Secondary | ICD-10-CM | POA: Diagnosis not present

## 2022-01-16 DIAGNOSIS — G9341 Metabolic encephalopathy: Secondary | ICD-10-CM | POA: Diagnosis not present

## 2022-01-16 DIAGNOSIS — L853 Xerosis cutis: Secondary | ICD-10-CM | POA: Diagnosis not present

## 2022-01-16 DIAGNOSIS — I480 Paroxysmal atrial fibrillation: Secondary | ICD-10-CM

## 2022-01-16 DIAGNOSIS — R278 Other lack of coordination: Secondary | ICD-10-CM | POA: Diagnosis not present

## 2022-01-16 DIAGNOSIS — S2241XD Multiple fractures of ribs, right side, subsequent encounter for fracture with routine healing: Secondary | ICD-10-CM

## 2022-01-16 DIAGNOSIS — H1013 Acute atopic conjunctivitis, bilateral: Secondary | ICD-10-CM | POA: Diagnosis not present

## 2022-01-16 DIAGNOSIS — F01A0 Vascular dementia, mild, without behavioral disturbance, psychotic disturbance, mood disturbance, and anxiety: Secondary | ICD-10-CM | POA: Diagnosis not present

## 2022-01-16 DIAGNOSIS — M2559 Pain in other specified joint: Secondary | ICD-10-CM | POA: Diagnosis not present

## 2022-01-16 DIAGNOSIS — G3184 Mild cognitive impairment, so stated: Secondary | ICD-10-CM | POA: Diagnosis not present

## 2022-01-16 MED ORDER — EUCERIN EX LOTN: TOPICAL_LOTION | Freq: Every day | CUTANEOUS | 0 refills | Status: AC

## 2022-01-16 NOTE — Progress Notes (Signed)
Location:   [place] Room Number: [place] of Service:  SNF [phone]) Provider:  Royal Hawthorn, NP  Royal Hawthorn, NP  Patient Care Team: Royal Hawthorn, NP as PCP - General (Nurse Practitioner) [place], Peter M, MD as PCP - Cardiology (Cardiology) Prentiss Bells, MD as Consulting Physician (Ophthalmology) [place], Peter M, MD as Consulting Physician (Cardiology) Allyn Kenner, MD as Consulting Physician (Dermatology) Michael Boston, MD as Consulting Physician (General Surgery) Community, Well Loreli Slot, MD as Consulting Physician (Hematology and Oncology) Magrinat, Virgie Dad, MD (Inactive) as Consulting Physician (Oncology) [name], MD as Consulting Physician (Ophthalmology)  Extended Emergency Contact Information Primary Emergency Contact: Gertha Calkin of [place] Phone: [phone] Work Phone: [phone] Mobile Phone: [phone] Relation: Son Secondary Emergency Contact: Joesphine Bare of [place] Phone: [phone] Mobile Phone: [phone] Relation: Son  Code Status:  DNR Goals of care: Advanced Directive information    01/16/2022    9:33 AM  Advanced Directives  Does Patient Have a Medical Advance Directive? Yes  Does patient want to make changes to medical advance directive? No - Patient declined     Chief Complaint  Patient presents with   Acute Visit    Eye drainage    HPI:  Pt is a 89 y.o. female seen today for an acute visit for eye drainage.   PMH significant for memory loss, HTN, afib, palpitations breast ca, hyponatremia, scoliosis, OP, hypercholesterolemia.   Nurse reports erythema, scaly skin, itchy watery eyes, and yellow drainage at the eye lid present for two days. Oncall provider started zyrtec last evening 1/10. She received one dose. Her son is at the bedside and states her eyes are improved. Cynthia Watkins denies any eye pain or change in  vision. Just some itching.  Covid swab rapid done and negative.   She was seen in the ER on 01/08/22 due to a mechanical fall with rib pain. She was found to have minimally displaced 6th, 10th, 11th rib fractures, minimal bibasilar atx vs scarring, and aortic atherosclerosis.   Pain has improved. Did have some confusion with ultram and robaxin which have been held and she is more clear mentally. Speech therapy saw her to assess her cognition. Scores were 17/30 on SLUMs.   HR in the ER was 49 on 12 lead EKG with incomplete RBBB. She is on atenolol for afib. HR in rehab has improved to 60-70s.   Very dry skin to back and legs noted.    Past Medical History:  Diagnosis Date   Acute upper respiratory infections of unspecified site 06/09/2011   Aphasia    Breast cancer ([place]) 12/11/2008   Encounter for general adult medical examination without abnormal findings    Encounter for immunization    Fatigue 01/2012   Hypercholesterolemia    on lipitor   Hypertension    Hyponatremia 01/2016   Impacted cerumen, bilateral    Memory loss    Mild cognitive impairment of uncertain or unknown etiology    Mitral valve prolapse    Nocturia    Other abnormal blood chemistry 01/21/2010   Palpitations 08/05/2000   Pure hypercholesterolemia, unspecified    Right bundle branch block    Sacrococcygeal disorders, not elsewhere classified    Scoliosis    Senile osteoporosis 09/28/2001   Unilateral primary osteoarthritis, left hip    Unspecified dementia, unspecified severity, without behavioral disturbance, psychotic disturbance, mood disturbance, and anxiety ([place])    Unspecified hearing loss, bilateral  Unsteadiness on feet    Vascular dementia, unspecified severity, without behavioral disturbance, psychotic disturbance, mood disturbance, and anxiety ([place])    Past Surgical History:  Procedure Laterality Date   BREAST LUMPECTOMY Right 12/27/2007   LAPAROSCOPIC LYSIS OF ADHESIONS  07/15/2006   [name]    OTHER SURGICAL HISTORY     TONSILLECTOMY     TUBAL LIGATION  1963    No Known Allergies  Allergies as of 01/16/2022   No Known Allergies      Medication List        Accurate as of January 16, 2022  9:40 AM. If you have any questions, ask your nurse or doctor.          STOP taking these medications    traMADol 50 MG tablet Commonly known as: ULTRAM Stopped by: Royal Hawthorn, NP       TAKE these medications    acetaminophen 325 MG tablet Commonly known as: Tylenol Take 2 tablets (650 mg total) by mouth in the morning, at noon, and at bedtime.   atenolol 25 MG tablet Commonly known as: TENORMIN Take 1/2 tab daily   cetirizine 10 MG chewable tablet Commonly known as: ZYRTEC Chew 10 mg by mouth daily.   lidocaine 4 % Place 1 patch onto the skin in the morning and at bedtime. 4%, transdermal, Twice A Day, Lidocaine 4% patch: apply to affected area in AM and remove in PM.   METHOCARBAMOL PO Take 250 mg by mouth every 8 (eight) hours as needed. What changed: Another medication with the same name was removed. Continue taking this medication, and follow the directions you see here. Changed by: Royal Hawthorn, NP   polyethylene glycol 17 g packet Commonly known as: MIRALAX / GLYCOLAX Take 17 g by mouth daily as needed for mild constipation. Take 17 gram daily in 6-8 oz of beverage of choice daily as needed for mild constipation.   Vitamin D3 25 MCG (1000 UT) Caps Take 1 capsule (1,000 Units total) by mouth daily.        Review of Systems  Constitutional:  Positive for activity change. Negative for appetite change, chills, diaphoresis, fatigue, fever and unexpected weight change.  HENT:  Positive for hearing loss and postnasal drip. Negative for congestion, ear discharge, ear pain, sinus pressure, sinus pain, sneezing, sore throat, tinnitus and trouble swallowing.   Eyes:  Positive for discharge, redness and itching. Negative for photophobia, pain and visual  disturbance.  Respiratory:  Negative for cough, shortness of breath and wheezing.        Right rib pain improved.   Cardiovascular:  Negative for chest pain, palpitations and leg swelling.  Gastrointestinal:  Negative for abdominal distention, abdominal pain, constipation and diarrhea.  Genitourinary:  Negative for difficulty urinating and dysuria.  Musculoskeletal:  Positive for arthralgias. Negative for back pain, gait problem, joint swelling and myalgias.  Skin:  Positive for color change.       Dry skin  Neurological:  Negative for dizziness, tremors, seizures, syncope, facial asymmetry, speech difficulty, weakness, light-headedness, numbness and headaches.  Psychiatric/Behavioral:  Positive for confusion (improving with underlying memory loss.). Negative for agitation and behavioral problems.     Immunization History  Administered Date(s) Administered   COVID-19, mRNA, vaccine(Comirnaty)12 years and older 10/30/2021   Fluad Quad(high Dose 65+) 10/08/2021   Influenza Split 10/23/2011   Influenza, High Dose Seasonal PF 09/20/2015, 10/09/2016, 10/15/2018   Influenza,inj,Quad PF,6+ Mos 10/28/2013, 10/30/2017   Influenza-Unspecified 10/06/2012, 10/26/2014   Moderna Sars-Covid-2  Vaccination 01/16/2019, 02/15/2019, 11/22/2019, 10/19/2020   Pneumococcal Conjugate-13 02/14/2014   Pneumococcal Polysaccharide-23 01/06/1997   Tetanus 05/13/2017   Zoster Recombinat (Shingrix) 10/09/2016, 09/17/2017, 01/14/2018   Zoster, Live 01/07/1999   Pertinent  Health Maintenance Due  Topic Date Due   INFLUENZA VACCINE  Completed   DEXA SCAN  Completed      03/21/2021   10:10 PM 06/21/2021    4:09 PM 07/14/2021    2:12 AM 07/14/2021    2:50 PM 01/08/2022   12:07 PM  [place] in the past year?  0     Was there an injury with Fall?  0     Fall Risk Category Calculator  0     Fall Risk Category  Low     Patient Fall Risk Level Low fall risk Low fall risk Low fall risk Low fall risk High fall  risk  Patient at Risk for Falls Due to  Impaired balance/gait     Fall risk Follow up  Falls evaluation completed      Functional Status Survey:    Vitals:   01/16/22 0926  BP: (!) 147/68  Pulse: 63  Resp: 17  Temp: 98.8 F (37.1 C)  SpO2: 94%  Weight: 105 lb (47.6 kg)  Height: '5\' 1"'$  (1.549 m)   Body mass index is 19.84 kg/m. Physical Exam Vitals and nursing note reviewed.  Constitutional:      General: She is not in acute distress.    Appearance: She is not diaphoretic.  HENT:     Head: Normocephalic and atraumatic.     Mouth/Throat:     Mouth: Mucous membranes are moist.  Eyes:     General:        Right eye: Discharge (yellow) present.        Left eye: Discharge (yellow) present.    Conjunctiva/sclera: Conjunctivae normal.     Right eye: Right conjunctiva is not injected. No chemosis or exudate.    Left eye: Left conjunctiva is not injected. No chemosis or exudate.    Pupils: Pupils are equal, round, and reactive to light.     Comments: Upper and lower lids with scaly dry erythema.   Neck:     Vascular: No JVD.  Cardiovascular:     Rate and Rhythm: Normal rate and regular rhythm.     Heart sounds: No murmur heard. Pulmonary:     Effort: Pulmonary effort is normal. No respiratory distress.     Breath sounds: Normal breath sounds. No wheezing.  Skin:    General: Skin is warm and dry.     Comments: Dry skin  Neurological:     General: No focal deficit present.     Mental Status: She is alert. Mental status is at baseline.  Psychiatric:        Mood and Affect: Mood normal.     Labs reviewed: Recent Labs    03/21/21 2220 06/18/21 0000 07/14/21 0242 07/18/21 0000 01/08/22 1212  NA 135   < > 133* 135* 135  K 3.7   < > 3.6 4.4 4.5  CL 99   < > 100 100 101  CO2 27   < > 23 23* 27  GLUCOSE 162*  --  104*  --  109*  BUN 23   < > '19 17 21  '$ CREATININE 0.81   < > 0.96 0.8 0.96  CALCIUM 10.7*   < > 9.0 9.4 9.9  MG 2.1  --   --   --   --    < > =  values in  this interval not displayed.   Recent Labs    06/18/21 0000 07/18/21 0000  AST 15 16  ALT 8 9  ALKPHOS 70 68  ALBUMIN  --  4.3   Recent Labs    03/21/21 2220 06/18/21 0000 07/14/21 0242 07/18/21 0000 01/08/22 1212  WBC 7.4   < > 6.0 6.9 7.8  NEUTROABS  --   --  3.6  --  4.8  HGB 14.5   < > 13.6 14.3 14.1  HCT 42.8   < > 39.7 42 42.2  MCV 95.7  --  95.7  --  97.5  PLT 299   < > 276 325 279   < > = values in this interval not displayed.   Lab Results  Component Value Date   TSH 2.15 07/18/2021   Lab Results  Component Value Date   HGBA1C 5.9 (H) 01/23/2016   Lab Results  Component Value Date   CHOL 369 (A) 08/28/2020   HDL 51 08/28/2020   LDLCALC 284 08/28/2020   TRIG 170 (A) 08/28/2020   CHOLHDL 3.5 01/27/2016    Significant Diagnostic Results in last 30 days:  CT Chest Wo Contrast  Addendum Date: 01/08/2022   ADDENDUM REPORT: 01/08/2022 14:48 ADDENDUM: There may be a minimally displaced fracture involving the anterior portion of the right eighth rib. Electronically Signed   By: Marijo Conception M.D.   On: 01/08/2022 14:48   Result Date: 01/08/2022 CLINICAL DATA:  Chest pain after fall. EXAM: CT CHEST WITHOUT CONTRAST TECHNIQUE: Multidetector CT imaging of the chest was performed following the standard protocol without IV contrast. RADIATION DOSE REDUCTION: This exam was performed according to the departmental dose-optimization program which includes automated exposure control, adjustment of the mA and/or kV according to patient size and/or use of iterative reconstruction technique. COMPARISON:  July 14, 2021. FINDINGS: Cardiovascular: Atherosclerosis of thoracic aorta is noted without aneurysm formation. Mild cardiomegaly. No pericardial effusion. Minimal coronary artery calcifications are noted. Mediastinum/Nodes: No enlarged mediastinal or axillary lymph nodes. Thyroid gland, trachea, and esophagus demonstrate no significant findings. Lungs/Pleura: No pneumothorax or  pleural effusion is noted. Minimal bibasilar subsegmental atelectasis or scarring is noted. Mild biapical scarring is noted. Upper Abdomen: No acute abnormality. Musculoskeletal: Minimally displaced fractures are seen involving the right sixth, tenth and eleventh ribs. IMPRESSION: Minimal bibasilar subsegmental atelectasis or scarring. Minimal coronary artery calcifications are noted. Minimally displaced right sixth, tenth and eleventh rib fractures. Aortic Atherosclerosis (ICD10-I70.0). Electronically Signed: By: Marijo Conception M.D. On: 01/08/2022 14:36   DG Ribs Unilateral W/Chest Right  Result Date: 01/08/2022 CLINICAL DATA:  right rib pain status post fall EXAM: RIGHT RIBS AND CHEST - 3+ VIEW COMPARISON:  CT 07/14/2021 FINDINGS: Unchanged cardiomediastinal silhouette. There is no focal airspace consolidation. There is no pleural effusion or evidence of pneumothorax. There are minimally displaced right anterolateral sixth, seventh, and eighth rib fractures. IMPRESSION: Minimally displaced right anterolateral sixth, seventh, and eighth rib fractures. No acute cardiopulmonary disease. Electronically Signed   By: Maurine Simmering M.D.   On: 01/08/2022 12:47   CT Head Wo Contrast  Result Date: 01/08/2022 CLINICAL DATA:  Trauma EXAM: CT HEAD WITHOUT CONTRAST CT CERVICAL SPINE WITHOUT CONTRAST TECHNIQUE: Multidetector CT imaging of the head and cervical spine was performed following the standard protocol without intravenous contrast. Multiplanar CT image reconstructions of the cervical spine were also generated. RADIATION DOSE REDUCTION: This exam was performed according to the departmental dose-optimization program which includes automated exposure control, adjustment  of the mA and/or kV according to patient size and/or use of iterative reconstruction technique. COMPARISON:  CT Head 07/12/20 FINDINGS: CT HEAD FINDINGS Brain: No evidence of acute infarction, hemorrhage, hydrocephalus, extra-axial collection or mass  lesion/mass effect. Sequela of mild chronic microvascular ischemic change. Vascular: No hyperdense vessel or unexpected calcification. Skull: Normal. Negative for fracture or focal lesion. Sinuses/Orbits: Bilateral lens replacement. Paranasal sinuses and mastoid air cells are clear. No middle ear effusion. Other: None. CT CERVICAL SPINE FINDINGS Alignment: Trace retrolisthesis of C4 on C5. Skull base and vertebrae: No acute fracture. No primary bone lesion or focal pathologic process. There is fusion of the C2-C3 facet joints on the left. Soft tissues and spinal canal: No prevertebral fluid or swelling. No visible canal hematoma. Disc levels:  No evidence of high-grade spinal canal stenosis. Upper chest: Negative. Other: 1.2 cm left thyroid nodule. IMPRESSION: 1. No acute intracranial abnormality. 2. No acute cervical spine fracture. Electronically Signed   By: Marin Roberts M.D.   On: 01/08/2022 12:43   CT Cervical Spine Wo Contrast  Result Date: 01/08/2022 CLINICAL DATA:  Trauma EXAM: CT HEAD WITHOUT CONTRAST CT CERVICAL SPINE WITHOUT CONTRAST TECHNIQUE: Multidetector CT imaging of the head and cervical spine was performed following the standard protocol without intravenous contrast. Multiplanar CT image reconstructions of the cervical spine were also generated. RADIATION DOSE REDUCTION: This exam was performed according to the departmental dose-optimization program which includes automated exposure control, adjustment of the mA and/or kV according to patient size and/or use of iterative reconstruction technique. COMPARISON:  CT Head 07/12/20 FINDINGS: CT HEAD FINDINGS Brain: No evidence of acute infarction, hemorrhage, hydrocephalus, extra-axial collection or mass lesion/mass effect. Sequela of mild chronic microvascular ischemic change. Vascular: No hyperdense vessel or unexpected calcification. Skull: Normal. Negative for fracture or focal lesion. Sinuses/Orbits: Bilateral lens replacement. Paranasal sinuses  and mastoid air cells are clear. No middle ear effusion. Other: None. CT CERVICAL SPINE FINDINGS Alignment: Trace retrolisthesis of C4 on C5. Skull base and vertebrae: No acute fracture. No primary bone lesion or focal pathologic process. There is fusion of the C2-C3 facet joints on the left. Soft tissues and spinal canal: No prevertebral fluid or swelling. No visible canal hematoma. Disc levels:  No evidence of high-grade spinal canal stenosis. Upper chest: Negative. Other: 1.2 cm left thyroid nodule. IMPRESSION: 1. No acute intracranial abnormality. 2. No acute cervical spine fracture. Electronically Signed   By: Marin Roberts M.D.   On: 01/08/2022 12:43    Assessment/Plan  1. Allergic conjunctivitis of both eyes Continue zyrtec If worsening with purulent drainage report back to psc  2. Closed fracture of multiple ribs of right side with routine healing, subsequent encounter Improved pain Improved mobility Encouraged to use IS Off ultram Will d/c robaxin Continue tylenol and lidocaine patch Anticipate d/c back to [place] with home care support in the next week   3. Xerosis of skin Add Eucerin   4. PAF (paroxysmal atrial fibrillation) (HCC) Rate is controlled with no low numbers at this time.   5. MCI SLUMS 17/30  Progressing over the past year towards a moderate stage   Labs/tests ordered:  NA

## 2022-01-17 DIAGNOSIS — R2689 Other abnormalities of gait and mobility: Secondary | ICD-10-CM | POA: Diagnosis not present

## 2022-01-17 DIAGNOSIS — S2241XS Multiple fractures of ribs, right side, sequela: Secondary | ICD-10-CM | POA: Diagnosis not present

## 2022-01-17 DIAGNOSIS — F01A0 Vascular dementia, mild, without behavioral disturbance, psychotic disturbance, mood disturbance, and anxiety: Secondary | ICD-10-CM | POA: Diagnosis not present

## 2022-01-17 DIAGNOSIS — R278 Other lack of coordination: Secondary | ICD-10-CM | POA: Diagnosis not present

## 2022-01-17 DIAGNOSIS — R2681 Unsteadiness on feet: Secondary | ICD-10-CM | POA: Diagnosis not present

## 2022-01-17 DIAGNOSIS — Z9181 History of falling: Secondary | ICD-10-CM | POA: Diagnosis not present

## 2022-01-17 DIAGNOSIS — M2559 Pain in other specified joint: Secondary | ICD-10-CM | POA: Diagnosis not present

## 2022-01-17 DIAGNOSIS — G9341 Metabolic encephalopathy: Secondary | ICD-10-CM | POA: Diagnosis not present

## 2022-01-20 DIAGNOSIS — N390 Urinary tract infection, site not specified: Secondary | ICD-10-CM | POA: Diagnosis not present

## 2022-01-20 DIAGNOSIS — M2559 Pain in other specified joint: Secondary | ICD-10-CM | POA: Diagnosis not present

## 2022-01-20 DIAGNOSIS — R2689 Other abnormalities of gait and mobility: Secondary | ICD-10-CM | POA: Diagnosis not present

## 2022-01-20 DIAGNOSIS — F01A0 Vascular dementia, mild, without behavioral disturbance, psychotic disturbance, mood disturbance, and anxiety: Secondary | ICD-10-CM | POA: Diagnosis not present

## 2022-01-20 DIAGNOSIS — G9341 Metabolic encephalopathy: Secondary | ICD-10-CM | POA: Diagnosis not present

## 2022-01-20 DIAGNOSIS — Z9181 History of falling: Secondary | ICD-10-CM | POA: Diagnosis not present

## 2022-01-20 DIAGNOSIS — R3589 Other polyuria: Secondary | ICD-10-CM | POA: Diagnosis not present

## 2022-01-20 DIAGNOSIS — S2241XS Multiple fractures of ribs, right side, sequela: Secondary | ICD-10-CM | POA: Diagnosis not present

## 2022-01-20 DIAGNOSIS — R2681 Unsteadiness on feet: Secondary | ICD-10-CM | POA: Diagnosis not present

## 2022-01-21 DIAGNOSIS — Z9181 History of falling: Secondary | ICD-10-CM | POA: Diagnosis not present

## 2022-01-21 DIAGNOSIS — R2681 Unsteadiness on feet: Secondary | ICD-10-CM | POA: Diagnosis not present

## 2022-01-21 DIAGNOSIS — S2241XS Multiple fractures of ribs, right side, sequela: Secondary | ICD-10-CM | POA: Diagnosis not present

## 2022-01-21 DIAGNOSIS — R2689 Other abnormalities of gait and mobility: Secondary | ICD-10-CM | POA: Diagnosis not present

## 2022-01-21 DIAGNOSIS — M2559 Pain in other specified joint: Secondary | ICD-10-CM | POA: Diagnosis not present

## 2022-01-21 DIAGNOSIS — R278 Other lack of coordination: Secondary | ICD-10-CM | POA: Diagnosis not present

## 2022-01-21 DIAGNOSIS — M6389 Disorders of muscle in diseases classified elsewhere, multiple sites: Secondary | ICD-10-CM | POA: Diagnosis not present

## 2022-01-21 DIAGNOSIS — G9341 Metabolic encephalopathy: Secondary | ICD-10-CM | POA: Diagnosis not present

## 2022-01-21 DIAGNOSIS — F01A0 Vascular dementia, mild, without behavioral disturbance, psychotic disturbance, mood disturbance, and anxiety: Secondary | ICD-10-CM | POA: Diagnosis not present

## 2022-01-22 DIAGNOSIS — F01A0 Vascular dementia, mild, without behavioral disturbance, psychotic disturbance, mood disturbance, and anxiety: Secondary | ICD-10-CM | POA: Diagnosis not present

## 2022-01-22 DIAGNOSIS — S2241XS Multiple fractures of ribs, right side, sequela: Secondary | ICD-10-CM | POA: Diagnosis not present

## 2022-01-22 DIAGNOSIS — M6389 Disorders of muscle in diseases classified elsewhere, multiple sites: Secondary | ICD-10-CM | POA: Diagnosis not present

## 2022-01-22 DIAGNOSIS — R350 Frequency of micturition: Secondary | ICD-10-CM | POA: Diagnosis not present

## 2022-01-22 DIAGNOSIS — R8279 Other abnormal findings on microbiological examination of urine: Secondary | ICD-10-CM | POA: Diagnosis not present

## 2022-01-22 DIAGNOSIS — R3915 Urgency of urination: Secondary | ICD-10-CM | POA: Diagnosis not present

## 2022-01-22 DIAGNOSIS — M2559 Pain in other specified joint: Secondary | ICD-10-CM | POA: Diagnosis not present

## 2022-01-22 DIAGNOSIS — Z9181 History of falling: Secondary | ICD-10-CM | POA: Diagnosis not present

## 2022-01-22 DIAGNOSIS — G9341 Metabolic encephalopathy: Secondary | ICD-10-CM | POA: Diagnosis not present

## 2022-01-22 DIAGNOSIS — R278 Other lack of coordination: Secondary | ICD-10-CM | POA: Diagnosis not present

## 2022-01-23 DIAGNOSIS — M2559 Pain in other specified joint: Secondary | ICD-10-CM | POA: Diagnosis not present

## 2022-01-23 DIAGNOSIS — F01A0 Vascular dementia, mild, without behavioral disturbance, psychotic disturbance, mood disturbance, and anxiety: Secondary | ICD-10-CM | POA: Diagnosis not present

## 2022-01-23 DIAGNOSIS — S2241XS Multiple fractures of ribs, right side, sequela: Secondary | ICD-10-CM | POA: Diagnosis not present

## 2022-01-23 DIAGNOSIS — R278 Other lack of coordination: Secondary | ICD-10-CM | POA: Diagnosis not present

## 2022-01-23 DIAGNOSIS — G9341 Metabolic encephalopathy: Secondary | ICD-10-CM | POA: Diagnosis not present

## 2022-01-23 DIAGNOSIS — Z9181 History of falling: Secondary | ICD-10-CM | POA: Diagnosis not present

## 2022-01-24 DIAGNOSIS — G9341 Metabolic encephalopathy: Secondary | ICD-10-CM | POA: Diagnosis not present

## 2022-01-24 DIAGNOSIS — R2681 Unsteadiness on feet: Secondary | ICD-10-CM | POA: Diagnosis not present

## 2022-01-24 DIAGNOSIS — R2689 Other abnormalities of gait and mobility: Secondary | ICD-10-CM | POA: Diagnosis not present

## 2022-01-24 DIAGNOSIS — R278 Other lack of coordination: Secondary | ICD-10-CM | POA: Diagnosis not present

## 2022-01-24 DIAGNOSIS — M6389 Disorders of muscle in diseases classified elsewhere, multiple sites: Secondary | ICD-10-CM | POA: Diagnosis not present

## 2022-01-24 DIAGNOSIS — M2559 Pain in other specified joint: Secondary | ICD-10-CM | POA: Diagnosis not present

## 2022-01-24 DIAGNOSIS — S2241XS Multiple fractures of ribs, right side, sequela: Secondary | ICD-10-CM | POA: Diagnosis not present

## 2022-01-24 DIAGNOSIS — Z9181 History of falling: Secondary | ICD-10-CM | POA: Diagnosis not present

## 2022-01-24 DIAGNOSIS — F01A0 Vascular dementia, mild, without behavioral disturbance, psychotic disturbance, mood disturbance, and anxiety: Secondary | ICD-10-CM | POA: Diagnosis not present

## 2022-01-26 DIAGNOSIS — Z9181 History of falling: Secondary | ICD-10-CM | POA: Diagnosis not present

## 2022-01-26 DIAGNOSIS — S2241XS Multiple fractures of ribs, right side, sequela: Secondary | ICD-10-CM | POA: Diagnosis not present

## 2022-01-26 DIAGNOSIS — M2559 Pain in other specified joint: Secondary | ICD-10-CM | POA: Diagnosis not present

## 2022-01-26 DIAGNOSIS — R278 Other lack of coordination: Secondary | ICD-10-CM | POA: Diagnosis not present

## 2022-01-26 DIAGNOSIS — M6389 Disorders of muscle in diseases classified elsewhere, multiple sites: Secondary | ICD-10-CM | POA: Diagnosis not present

## 2022-01-27 DIAGNOSIS — S2241XS Multiple fractures of ribs, right side, sequela: Secondary | ICD-10-CM | POA: Diagnosis not present

## 2022-01-27 DIAGNOSIS — G9341 Metabolic encephalopathy: Secondary | ICD-10-CM | POA: Diagnosis not present

## 2022-01-27 DIAGNOSIS — R2689 Other abnormalities of gait and mobility: Secondary | ICD-10-CM | POA: Diagnosis not present

## 2022-01-27 DIAGNOSIS — R2681 Unsteadiness on feet: Secondary | ICD-10-CM | POA: Diagnosis not present

## 2022-01-27 DIAGNOSIS — F01A0 Vascular dementia, mild, without behavioral disturbance, psychotic disturbance, mood disturbance, and anxiety: Secondary | ICD-10-CM | POA: Diagnosis not present

## 2022-01-27 DIAGNOSIS — M2559 Pain in other specified joint: Secondary | ICD-10-CM | POA: Diagnosis not present

## 2022-01-27 DIAGNOSIS — Z9181 History of falling: Secondary | ICD-10-CM | POA: Diagnosis not present

## 2022-01-28 DIAGNOSIS — Z9181 History of falling: Secondary | ICD-10-CM | POA: Diagnosis not present

## 2022-01-28 DIAGNOSIS — M2559 Pain in other specified joint: Secondary | ICD-10-CM | POA: Diagnosis not present

## 2022-01-28 DIAGNOSIS — M6389 Disorders of muscle in diseases classified elsewhere, multiple sites: Secondary | ICD-10-CM | POA: Diagnosis not present

## 2022-01-28 DIAGNOSIS — R278 Other lack of coordination: Secondary | ICD-10-CM | POA: Diagnosis not present

## 2022-01-28 DIAGNOSIS — R2681 Unsteadiness on feet: Secondary | ICD-10-CM | POA: Diagnosis not present

## 2022-01-28 DIAGNOSIS — S2241XS Multiple fractures of ribs, right side, sequela: Secondary | ICD-10-CM | POA: Diagnosis not present

## 2022-01-28 DIAGNOSIS — R2689 Other abnormalities of gait and mobility: Secondary | ICD-10-CM | POA: Diagnosis not present

## 2022-01-30 DIAGNOSIS — F01A0 Vascular dementia, mild, without behavioral disturbance, psychotic disturbance, mood disturbance, and anxiety: Secondary | ICD-10-CM | POA: Diagnosis not present

## 2022-01-30 DIAGNOSIS — Z9181 History of falling: Secondary | ICD-10-CM | POA: Diagnosis not present

## 2022-01-30 DIAGNOSIS — S2241XS Multiple fractures of ribs, right side, sequela: Secondary | ICD-10-CM | POA: Diagnosis not present

## 2022-01-30 DIAGNOSIS — G9341 Metabolic encephalopathy: Secondary | ICD-10-CM | POA: Diagnosis not present

## 2022-01-30 DIAGNOSIS — M2559 Pain in other specified joint: Secondary | ICD-10-CM | POA: Diagnosis not present

## 2022-01-30 DIAGNOSIS — R2681 Unsteadiness on feet: Secondary | ICD-10-CM | POA: Diagnosis not present

## 2022-01-30 DIAGNOSIS — R2689 Other abnormalities of gait and mobility: Secondary | ICD-10-CM | POA: Diagnosis not present

## 2022-01-31 DIAGNOSIS — F01A0 Vascular dementia, mild, without behavioral disturbance, psychotic disturbance, mood disturbance, and anxiety: Secondary | ICD-10-CM | POA: Diagnosis not present

## 2022-01-31 DIAGNOSIS — G9341 Metabolic encephalopathy: Secondary | ICD-10-CM | POA: Diagnosis not present

## 2022-02-03 DIAGNOSIS — S2241XS Multiple fractures of ribs, right side, sequela: Secondary | ICD-10-CM | POA: Diagnosis not present

## 2022-02-03 DIAGNOSIS — G9341 Metabolic encephalopathy: Secondary | ICD-10-CM | POA: Diagnosis not present

## 2022-02-03 DIAGNOSIS — R278 Other lack of coordination: Secondary | ICD-10-CM | POA: Diagnosis not present

## 2022-02-03 DIAGNOSIS — Z9181 History of falling: Secondary | ICD-10-CM | POA: Diagnosis not present

## 2022-02-03 DIAGNOSIS — F01A0 Vascular dementia, mild, without behavioral disturbance, psychotic disturbance, mood disturbance, and anxiety: Secondary | ICD-10-CM | POA: Diagnosis not present

## 2022-02-03 DIAGNOSIS — M2559 Pain in other specified joint: Secondary | ICD-10-CM | POA: Diagnosis not present

## 2022-02-04 DIAGNOSIS — R2681 Unsteadiness on feet: Secondary | ICD-10-CM | POA: Diagnosis not present

## 2022-02-04 DIAGNOSIS — M2559 Pain in other specified joint: Secondary | ICD-10-CM | POA: Diagnosis not present

## 2022-02-04 DIAGNOSIS — R2689 Other abnormalities of gait and mobility: Secondary | ICD-10-CM | POA: Diagnosis not present

## 2022-02-04 DIAGNOSIS — S2241XS Multiple fractures of ribs, right side, sequela: Secondary | ICD-10-CM | POA: Diagnosis not present

## 2022-02-04 DIAGNOSIS — G9341 Metabolic encephalopathy: Secondary | ICD-10-CM | POA: Diagnosis not present

## 2022-02-04 DIAGNOSIS — Z9181 History of falling: Secondary | ICD-10-CM | POA: Diagnosis not present

## 2022-02-04 DIAGNOSIS — F01A0 Vascular dementia, mild, without behavioral disturbance, psychotic disturbance, mood disturbance, and anxiety: Secondary | ICD-10-CM | POA: Diagnosis not present

## 2022-02-05 DIAGNOSIS — R278 Other lack of coordination: Secondary | ICD-10-CM | POA: Diagnosis not present

## 2022-02-05 DIAGNOSIS — M2559 Pain in other specified joint: Secondary | ICD-10-CM | POA: Diagnosis not present

## 2022-02-05 DIAGNOSIS — S2241XS Multiple fractures of ribs, right side, sequela: Secondary | ICD-10-CM | POA: Diagnosis not present

## 2022-02-05 DIAGNOSIS — Z9181 History of falling: Secondary | ICD-10-CM | POA: Diagnosis not present

## 2022-02-06 ENCOUNTER — Non-Acute Institutional Stay (SKILLED_NURSING_FACILITY): Payer: Medicare Other | Admitting: Adult Health

## 2022-02-06 ENCOUNTER — Encounter: Payer: Self-pay | Admitting: Adult Health

## 2022-02-06 DIAGNOSIS — E7800 Pure hypercholesterolemia, unspecified: Secondary | ICD-10-CM

## 2022-02-06 DIAGNOSIS — I480 Paroxysmal atrial fibrillation: Secondary | ICD-10-CM

## 2022-02-06 DIAGNOSIS — E559 Vitamin D deficiency, unspecified: Secondary | ICD-10-CM | POA: Diagnosis not present

## 2022-02-06 DIAGNOSIS — S2241XD Multiple fractures of ribs, right side, subsequent encounter for fracture with routine healing: Secondary | ICD-10-CM | POA: Diagnosis not present

## 2022-02-06 DIAGNOSIS — Z66 Do not resuscitate: Secondary | ICD-10-CM

## 2022-02-06 DIAGNOSIS — G3184 Mild cognitive impairment, so stated: Secondary | ICD-10-CM | POA: Diagnosis not present

## 2022-02-06 NOTE — Progress Notes (Signed)
Location:  [facility] Room Number: [place] of Service:  SNF [phone])  Provider: Royal Hawthorn, NP    PCP: Royal Hawthorn, NP Patient Care Team: Royal Hawthorn, NP as PCP - General (Nurse Practitioner) Martinique, Peter M, MD as PCP - Cardiology (Cardiology) Prentiss Bells, MD as Consulting Physician (Ophthalmology) Martinique, Peter M, MD as Consulting Physician (Cardiology) Allyn Kenner, MD as Consulting Physician (Dermatology) Michael Boston, MD as Consulting Physician (General Surgery) [facility], Well Loreli Slot, MD as Consulting Physician (Hematology and Oncology) Magrinat, Virgie Dad, MD (Inactive) as Consulting Physician (Oncology) [name], MD as Consulting Physician (Ophthalmology)  Extended Emergency Contact Information Primary Emergency Contact: Gertha Calkin of [place] Phone: [phone] Work Phone: [phone] Mobile Phone: [phone] Relation: Son Secondary Emergency Contact: Joesphine Bare of [place] Phone: [phone] Mobile Phone: [phone] Relation: Son  Code Status: DNR Goals of care:  Advanced Directive information    02/06/2022    9:46 AM  Advanced Directives  Does Patient Have a Medical Advance Directive? Yes  Type of Paramedic of [place];Out of facility DNR (pink MOST or yellow form)  Does patient want to make changes to medical advance directive? No - Patient declined  Copy of [place] in Chart? Yes - validated most recent copy scanned in chart (See row information)  Pre-existing out of facility DNR order (yellow form or pink MOST form) Yellow form placed in chart (order not valid for inpatient use)     No Known Allergies  Chief Complaint  Patient presents with   Discharge Note    Discharge from [facility]     HPI:  89 y.o. female seen for discharge from skilled rehab at [facility] to assisted  living.    PMH significant for memory loss, HTN, afib, palpitations, breast ca, hyponatremia, OP, hypercholesterolemia.   She was seen in the ER on 01/08/22 due to a mechanical fall with rib pain. She was found to have minimally displaced 6th, 10th, 11th rib fractures, minimal bibasilar atx vs scarring, and aortic atherosclerosis.   She came to rehab due to increased care needs and pain management.   At this time her ribs area healing and her pain has reduced. She is able to perform tasks with reminders. Ambulatory, pleasant, and able to dress/bath herself (cuing) MMSE 22/30 Has some apathy and lack of verbalization.   She is ready to move to AL.    Past Medical History:  Diagnosis Date   Acute upper respiratory infections of unspecified site 06/09/2011   Aphasia    Breast cancer ([place]) 12/11/2008   Encounter for general adult medical examination without abnormal findings    Encounter for immunization    Fatigue 01/2012   Hypercholesterolemia    on lipitor   Hypertension    Hyponatremia 01/2016   Impacted cerumen, bilateral    Memory loss    Mild cognitive impairment of uncertain or unknown etiology    Mitral valve prolapse    Nocturia    Other abnormal blood chemistry 01/21/2010   Palpitations 08/05/2000   Pure hypercholesterolemia, unspecified    Right bundle branch block    Sacrococcygeal disorders, not elsewhere classified    Scoliosis    Senile osteoporosis 09/28/2001   Unilateral primary osteoarthritis, left hip    Unspecified dementia, unspecified severity, without behavioral disturbance, psychotic disturbance, mood disturbance, and anxiety ([place])    Unspecified hearing loss, bilateral    Unsteadiness on feet  Vascular dementia, unspecified severity, without behavioral disturbance, psychotic disturbance, mood disturbance, and anxiety [facility])     Past Surgical History:  Procedure Laterality Date   BREAST LUMPECTOMY Right 12/27/2007   LAPAROSCOPIC LYSIS OF ADHESIONS   07/15/2006   [name]   OTHER SURGICAL HISTORY     TONSILLECTOMY     TUBAL LIGATION  1963      reports that she quit smoking about 67 years ago. Her smoking use included cigarettes. She has a 2.50 pack-year smoking history. She has never used smokeless tobacco. She reports that she does not currently use alcohol. She reports that she does not use drugs. Social History   Socioeconomic History   Marital status: Widowed    Spouse name: Not on file   Number of children: 3   Years of education: Not on file   Highest education level: Not on file  Occupational History   Occupation: Retired Product manager: RETIRED  Tobacco Use   Smoking status: Former    Packs/day: 0.25    Years: 10.00    Total pack years: 2.50    Types: Cigarettes    Quit date: 08/06/1954    Years since quitting: 67.5   Smokeless tobacco: Never  Vaping Use   Vaping Use: Never used  Substance and Sexual Activity   Alcohol use: Not Currently    Comment: Occasionally    Drug use: No   Sexual activity: Never  Other Topics Concern   Not on file  Social History Narrative   Lives at [facility] since 05/1998   Widowed   Living will   Former smoker, stopped 1956   Alcohol  Rare   Exercise - walk one hour daily 7 days a week,  3 days of stretching and strengthen, yoga 45 minutes 2 days a week   Social Determinants of Health   Financial Resource Strain: Low Risk  (09/15/2017)   Overall Financial Resource Strain (CARDIA)    Difficulty of Paying Living Expenses: Not hard at all  Food Insecurity: No Food Insecurity (09/15/2017)   Hunger Vital Sign    Worried About Running Out of Food in the Last Year: Never true    [place] in the Last Year: Never true  Transportation Needs: No Transportation Needs (09/15/2017)   PRAPARE - Hydrologist (Medical): No    Lack of Transportation (Non-Medical): No  Physical Activity: Sufficiently Active (09/15/2017)   Exercise Vital Sign    Days of  Exercise per Week: 7 days    Minutes of Exercise per Session: 120 min  Stress: No Stress Concern Present (09/15/2017)   [place]    Feeling of Stress : Not at all  Social Connections: Moderately Isolated (09/15/2017)   Social Connection and Isolation Panel [NHANES]    Frequency of Communication with Friends and Family: More than three times a week    Frequency of Social Gatherings with Friends and Family: More than three times a week    Attends Religious Services: Never    Marine scientist or Organizations: No    Attends Archivist Meetings: Never    Marital Status: Widowed  Intimate Partner Violence: Not At Risk (09/15/2017)   Humiliation, Afraid, Rape, and Kick questionnaire    Fear of Current or Ex-Partner: No    Emotionally Abused: No    Physically Abused: No    Sexually Abused: No   Functional  Status Survey:    No Known Allergies  Pertinent  Health Maintenance Due  Topic Date Due   INFLUENZA VACCINE  Completed   DEXA SCAN  Completed    Medications: Outpatient Encounter Medications as of 02/06/2022  Medication Sig   acetaminophen (TYLENOL) 325 MG tablet Take 650 mg by mouth 3 (three) times daily as needed.   atenolol (TENORMIN) 25 MG tablet Take 1/2 tab daily   Cholecalciferol (VITAMIN D3) 25 MCG (1000 UT) CAPS Take 1 capsule (1,000 Units total) by mouth daily.   Emollient (EUCERIN) lotion Apply topically daily.   lidocaine 4 % Place 1 patch onto the skin in the morning and at bedtime. 4%, transdermal, Twice A Day, Lidocaine 4% patch: apply to affected area in AM and remove in PM.   [DISCONTINUED] acetaminophen (TYLENOL) 325 MG tablet Take 2 tablets (650 mg total) by mouth in the morning, at noon, and at bedtime.   [DISCONTINUED] cetirizine (ZYRTEC) 10 MG chewable tablet Chew 10 mg by mouth daily.   [DISCONTINUED] polyethylene glycol (MIRALAX / GLYCOLAX) 17 g packet Take 17 g by mouth daily as  needed for mild constipation. Take 17 gram daily in 6-8 oz of beverage of choice daily as needed for mild constipation.   No facility-administered encounter medications on file as of 02/06/2022.    Review of Systems  Constitutional:  Negative for activity change, appetite change, chills, diaphoresis, fatigue, fever and unexpected weight change.  HENT:  Negative for congestion.   Respiratory:  Negative for cough, shortness of breath and wheezing.   Cardiovascular:  Negative for chest pain, palpitations and leg swelling.  Gastrointestinal:  Negative for abdominal distention, abdominal pain, constipation and diarrhea.  Genitourinary:  Negative for difficulty urinating and dysuria.  Musculoskeletal:  Negative for arthralgias, back pain, gait problem, joint swelling and myalgias.  Neurological:  Negative for dizziness, tremors, seizures, syncope, facial asymmetry, speech difficulty, weakness, light-headedness, numbness and headaches.  Psychiatric/Behavioral:  Negative for agitation, behavioral problems and confusion.        Memory loss    Vitals:   02/06/22 0944  BP: 124/77  Pulse: 71  Resp: 14  SpO2: 96%  Weight: 107 lb (48.5 kg)  Height: '5\' 2"'$  (1.575 m)   Body mass index is 19.57 kg/m. Physical Exam Vitals and nursing note reviewed.  Constitutional:      General: She is not in acute distress.    Appearance: She is not diaphoretic.  HENT:     Head: Normocephalic and atraumatic.  Neck:     Vascular: No JVD.  Cardiovascular:     Rate and Rhythm: Normal rate.     Heart sounds: No murmur heard.    Comments: Regular with skipped beat Pulmonary:     Effort: Pulmonary effort is normal. No respiratory distress.     Breath sounds: Normal breath sounds. No wheezing or rales.  Chest:     Chest wall: No tenderness.  Abdominal:     General: Bowel sounds are normal. There is no distension.     Palpations: Abdomen is soft.     Tenderness: There is no abdominal tenderness.   Musculoskeletal:     Right lower leg: No edema.     Left lower leg: No edema.  Skin:    General: Skin is warm and dry.     Comments: dry  Neurological:     General: No focal deficit present.     Mental Status: She is alert. Mental status is at baseline.  Psychiatric:  Comments: flat     Labs reviewed: Basic Metabolic Panel: Recent Labs    03/21/21 2220 06/18/21 0000 07/14/21 0242 07/18/21 0000 01/08/22 1212  NA 135   < > 133* 135* 135  K 3.7   < > 3.6 4.4 4.5  CL 99   < > 100 100 101  CO2 27   < > 23 23* 27  GLUCOSE 162*  --  104*  --  109*  BUN 23   < > '19 17 21  '$ CREATININE 0.81   < > 0.96 0.8 0.96  CALCIUM 10.7*   < > 9.0 9.4 9.9  MG 2.1  --   --   --   --    < > = values in this interval not displayed.   Liver Function Tests: Recent Labs    06/18/21 0000 07/18/21 0000  AST 15 16  ALT 8 9  ALKPHOS 70 68  ALBUMIN  --  4.3   No results for input(s): "LIPASE", "AMYLASE" in the last 8760 hours. No results for input(s): "AMMONIA" in the last 8760 hours. CBC: Recent Labs    03/21/21 2220 06/18/21 0000 07/14/21 0242 07/18/21 0000 01/08/22 1212  WBC 7.4   < > 6.0 6.9 7.8  NEUTROABS  --   --  3.6  --  4.8  HGB 14.5   < > 13.6 14.3 14.1  HCT 42.8   < > 39.7 42 42.2  MCV 95.7  --  95.7  --  97.5  PLT 299   < > 276 325 279   < > = values in this interval not displayed.   Cardiac Enzymes: No results for input(s): "CKTOTAL", "CKMB", "CKMBINDEX", "TROPONINI" in the last 8760 hours. BNP: Invalid input(s): "POCBNP" CBG: Recent Labs    01/08/22 1214  GLUCAP 102*    Procedures and Imaging Studies During Stay: CT Chest Wo Contrast  Addendum Date: 01/08/2022   ADDENDUM REPORT: 01/08/2022 14:48 ADDENDUM: There may be a minimally displaced fracture involving the anterior portion of the right eighth rib. Electronically Signed   By: Marijo Conception M.D.   On: 01/08/2022 14:48   Result Date: 01/08/2022 CLINICAL DATA:  Chest pain after fall. EXAM: CT CHEST  WITHOUT CONTRAST TECHNIQUE: Multidetector CT imaging of the chest was performed following the standard protocol without IV contrast. RADIATION DOSE REDUCTION: This exam was performed according to the departmental dose-optimization program which includes automated exposure control, adjustment of the mA and/or kV according to patient size and/or use of iterative reconstruction technique. COMPARISON:  July 14, 2021. FINDINGS: Cardiovascular: Atherosclerosis of thoracic aorta is noted without aneurysm formation. Mild cardiomegaly. No pericardial effusion. Minimal coronary artery calcifications are noted. Mediastinum/Nodes: No enlarged mediastinal or axillary lymph nodes. Thyroid gland, trachea, and esophagus demonstrate no significant findings. Lungs/Pleura: No pneumothorax or pleural effusion is noted. Minimal bibasilar subsegmental atelectasis or scarring is noted. Mild biapical scarring is noted. Upper Abdomen: No acute abnormality. Musculoskeletal: Minimally displaced fractures are seen involving the right sixth, tenth and eleventh ribs. IMPRESSION: Minimal bibasilar subsegmental atelectasis or scarring. Minimal coronary artery calcifications are noted. Minimally displaced right sixth, tenth and eleventh rib fractures. Aortic Atherosclerosis (ICD10-I70.0). Electronically Signed: By: Marijo Conception M.D. On: 01/08/2022 14:36   DG Ribs Unilateral W/Chest Right  Result Date: 01/08/2022 CLINICAL DATA:  right rib pain status post fall EXAM: RIGHT RIBS AND CHEST - 3+ VIEW COMPARISON:  CT 07/14/2021 FINDINGS: Unchanged cardiomediastinal silhouette. There is no focal airspace consolidation. There is no pleural  effusion or evidence of pneumothorax. There are minimally displaced right anterolateral sixth, seventh, and eighth rib fractures. IMPRESSION: Minimally displaced right anterolateral sixth, seventh, and eighth rib fractures. No acute cardiopulmonary disease. Electronically Signed   By: Maurine Simmering M.D.   On:  01/08/2022 12:47   CT Head Wo Contrast  Result Date: 01/08/2022 CLINICAL DATA:  Trauma EXAM: CT HEAD WITHOUT CONTRAST CT CERVICAL SPINE WITHOUT CONTRAST TECHNIQUE: Multidetector CT imaging of the head and cervical spine was performed following the standard protocol without intravenous contrast. Multiplanar CT image reconstructions of the cervical spine were also generated. RADIATION DOSE REDUCTION: This exam was performed according to the departmental dose-optimization program which includes automated exposure control, adjustment of the mA and/or kV according to patient size and/or use of iterative reconstruction technique. COMPARISON:  CT Head 07/12/20 FINDINGS: CT HEAD FINDINGS Brain: No evidence of acute infarction, hemorrhage, hydrocephalus, extra-axial collection or mass lesion/mass effect. Sequela of mild chronic microvascular ischemic change. Vascular: No hyperdense vessel or unexpected calcification. Skull: Normal. Negative for fracture or focal lesion. Sinuses/Orbits: Bilateral lens replacement. Paranasal sinuses and mastoid air cells are clear. No middle ear effusion. Other: None. CT CERVICAL SPINE FINDINGS Alignment: Trace retrolisthesis of C4 on C5. Skull base and vertebrae: No acute fracture. No primary bone lesion or focal pathologic process. There is fusion of the C2-C3 facet joints on the left. Soft tissues and spinal canal: No prevertebral fluid or swelling. No visible canal hematoma. Disc levels:  No evidence of high-grade spinal canal stenosis. Upper chest: Negative. Other: 1.2 cm left thyroid nodule. IMPRESSION: 1. No acute intracranial abnormality. 2. No acute cervical spine fracture. Electronically Signed   By: Marin Roberts M.D.   On: 01/08/2022 12:43   CT Cervical Spine Wo Contrast  Result Date: 01/08/2022 CLINICAL DATA:  Trauma EXAM: CT HEAD WITHOUT CONTRAST CT CERVICAL SPINE WITHOUT CONTRAST TECHNIQUE: Multidetector CT imaging of the head and cervical spine was performed following the  standard protocol without intravenous contrast. Multiplanar CT image reconstructions of the cervical spine were also generated. RADIATION DOSE REDUCTION: This exam was performed according to the departmental dose-optimization program which includes automated exposure control, adjustment of the mA and/or kV according to patient size and/or use of iterative reconstruction technique. COMPARISON:  CT Head 07/12/20 FINDINGS: CT HEAD FINDINGS Brain: No evidence of acute infarction, hemorrhage, hydrocephalus, extra-axial collection or mass lesion/mass effect. Sequela of mild chronic microvascular ischemic change. Vascular: No hyperdense vessel or unexpected calcification. Skull: Normal. Negative for fracture or focal lesion. Sinuses/Orbits: Bilateral lens replacement. Paranasal sinuses and mastoid air cells are clear. No middle ear effusion. Other: None. CT CERVICAL SPINE FINDINGS Alignment: Trace retrolisthesis of C4 on C5. Skull base and vertebrae: No acute fracture. No primary bone lesion or focal pathologic process. There is fusion of the C2-C3 facet joints on the left. Soft tissues and spinal canal: No prevertebral fluid or swelling. No visible canal hematoma. Disc levels:  No evidence of high-grade spinal canal stenosis. Upper chest: Negative. Other: 1.2 cm left thyroid nodule. IMPRESSION: 1. No acute intracranial abnormality. 2. No acute cervical spine fracture. Electronically Signed   By: Marin Roberts M.D.   On: 01/08/2022 12:43    Assessment/Plan:    1. Do not resuscitate Order updated  - Do not attempt resuscitation (DNR)  2. Closed fracture of multiple ribs of right side with routine healing, subsequent encounter Healing with reduced pain No trouble breathing/cough/crepitus Change lidocaine patch to prn D/C IS Ok for discharge to AL F/U in clinic  in 2-4 weeks s/p move to see how she is doing.   3. MCI (mild cognitive impairment) MMSE 22/30 passed clock Jan 2024 Has had some progression over the  past year.  Doing well and ready to move to assisted living   4. PAF (paroxysmal atrial fibrillation) ([place]) Rate is controlled Followed by cardiology [place] deferred per cardiology  5. Vitamin D deficiency Last vitamin D Lab Results  Component Value Date   VD25OH 37.5 11/15/2020   Current on vitamin d  6. Hypercholesterolemia Lab Results  Component Value Date   CHOL 369 (A) 08/28/2020   HDL 51 08/28/2020   LDLCALC 284 08/28/2020   TRIG 170 (A) 08/28/2020   CHOLHDL 3.5 01/27/2016   Has previously declined and statin and given her age I do not feel this would add a benefit for her.    Patient is being discharged with the following home health services:  NA  Patient is being discharged with the following durable medical equipment:   NA Patient has been advised to f/u with their PCP in 1-2 weeks to for a transitions of care visit.  Social services at their facility was responsible for arranging this appointment.  Pt was provided with adequate prescriptions of noncontrolled medications to reach the scheduled appointment .  For controlled substances, a limited supply was provided as appropriate for the individual patient.  If the pt normally receives these medications from a pain clinic or has a contract with another physician, these medications should be received from that clinic or physician only).    Future labs/tests needed:  NA other than routine labs

## 2022-02-07 DIAGNOSIS — R278 Other lack of coordination: Secondary | ICD-10-CM | POA: Diagnosis not present

## 2022-02-07 DIAGNOSIS — M2559 Pain in other specified joint: Secondary | ICD-10-CM | POA: Diagnosis not present

## 2022-02-07 DIAGNOSIS — F01A0 Vascular dementia, mild, without behavioral disturbance, psychotic disturbance, mood disturbance, and anxiety: Secondary | ICD-10-CM | POA: Diagnosis not present

## 2022-02-07 DIAGNOSIS — Z9181 History of falling: Secondary | ICD-10-CM | POA: Diagnosis not present

## 2022-02-07 DIAGNOSIS — M6389 Disorders of muscle in diseases classified elsewhere, multiple sites: Secondary | ICD-10-CM | POA: Diagnosis not present

## 2022-02-07 DIAGNOSIS — S2241XS Multiple fractures of ribs, right side, sequela: Secondary | ICD-10-CM | POA: Diagnosis not present

## 2022-02-10 DIAGNOSIS — R278 Other lack of coordination: Secondary | ICD-10-CM | POA: Diagnosis not present

## 2022-02-10 DIAGNOSIS — S2241XS Multiple fractures of ribs, right side, sequela: Secondary | ICD-10-CM | POA: Diagnosis not present

## 2022-02-10 DIAGNOSIS — Z9181 History of falling: Secondary | ICD-10-CM | POA: Diagnosis not present

## 2022-02-10 DIAGNOSIS — M2559 Pain in other specified joint: Secondary | ICD-10-CM | POA: Diagnosis not present

## 2022-02-10 DIAGNOSIS — G9341 Metabolic encephalopathy: Secondary | ICD-10-CM | POA: Diagnosis not present

## 2022-02-10 DIAGNOSIS — M6389 Disorders of muscle in diseases classified elsewhere, multiple sites: Secondary | ICD-10-CM | POA: Diagnosis not present

## 2022-02-10 DIAGNOSIS — F01A0 Vascular dementia, mild, without behavioral disturbance, psychotic disturbance, mood disturbance, and anxiety: Secondary | ICD-10-CM | POA: Diagnosis not present

## 2022-02-11 IMAGING — CT CT HEAD W/O CM
4 series · 17 of 47 positions shown, 19 images · non-contrast
Comparison: 03/22/2019

CLINICAL DATA: Dizziness

EXAM:
CT HEAD WITHOUT CONTRAST
TECHNIQUE: Contiguous axial images were obtained from the base of the skull
through the vertex without intravenous contrast.

[Series 2: head wo · axial · 0.43mm/px · z∈[-143,-23]mm · 7 of 32 slices shown, 9 images]
[im 4/32  brain]
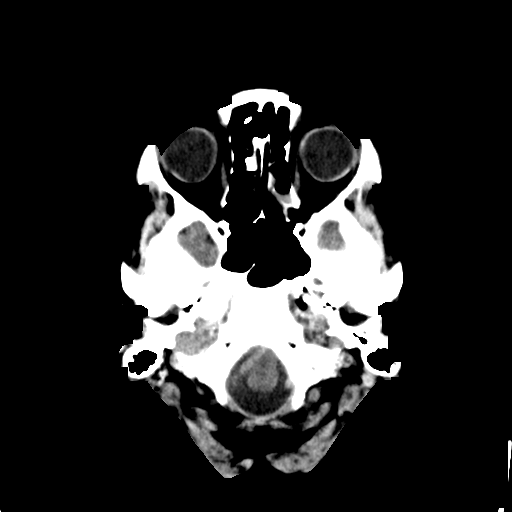
[im 4/32  bone]
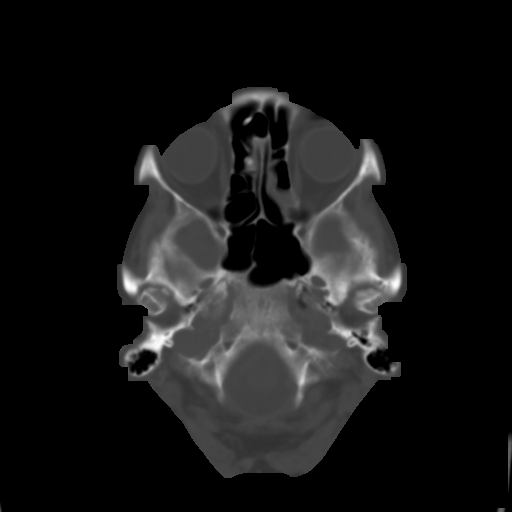
[im 8/32  brain]
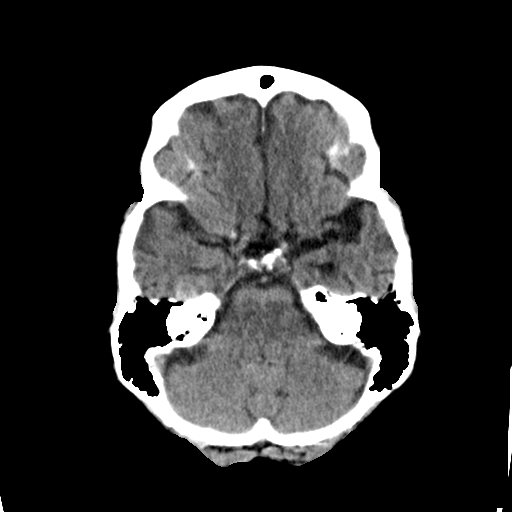
[im 12/32  brain]
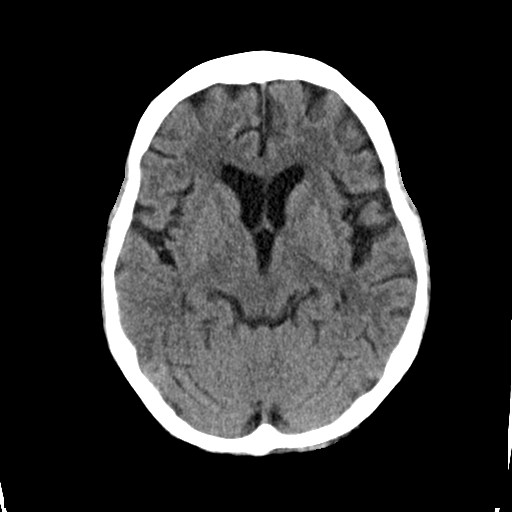
[im 16/32  brain]
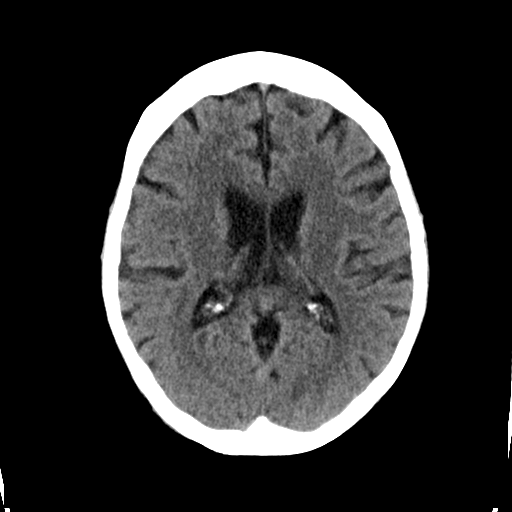
[im 20/32  brain]
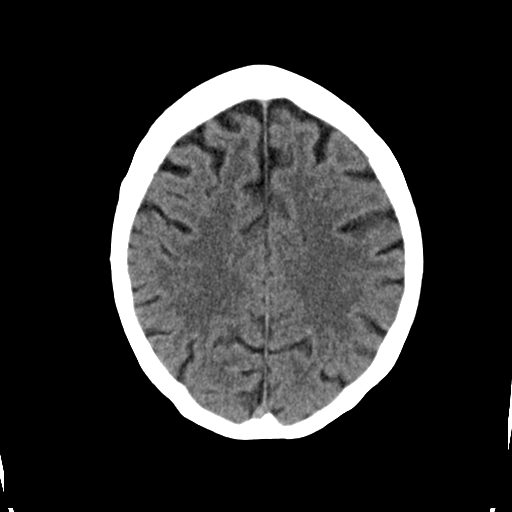
[im 20/32  bone]
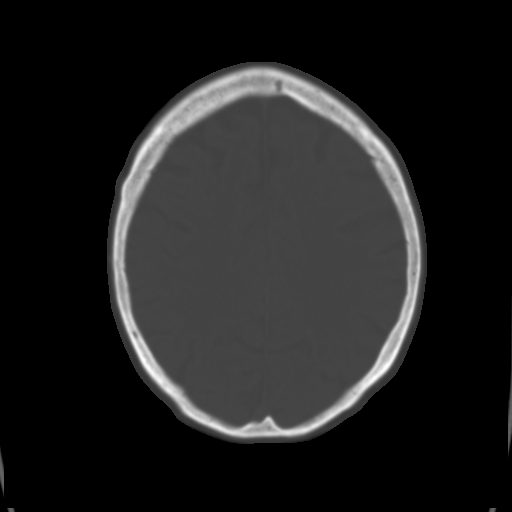
[im 24/32  brain]
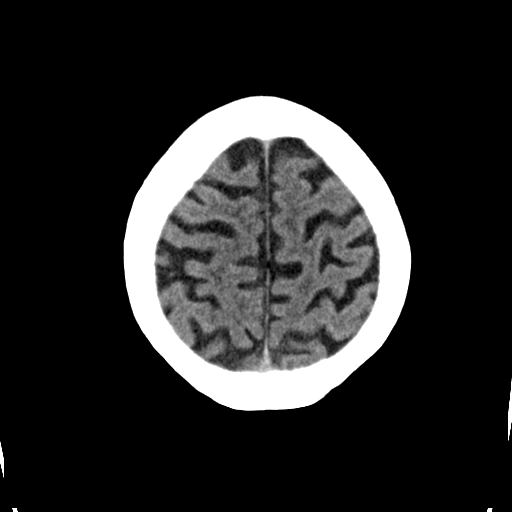
[im 28/32  brain]
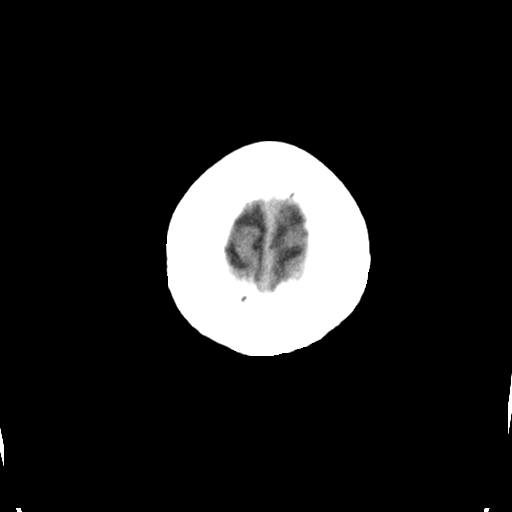

[Series 3: head bone · axial · 0.43mm/px · z∈[-144,-88]mm · 4 of 80 slices shown]
[im 8/80  bone]
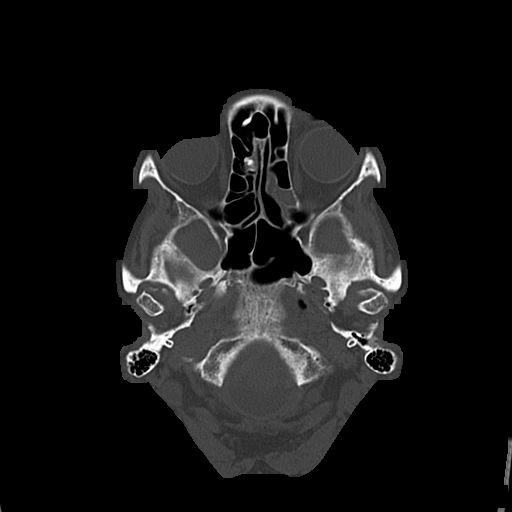
[im 16/80  bone]
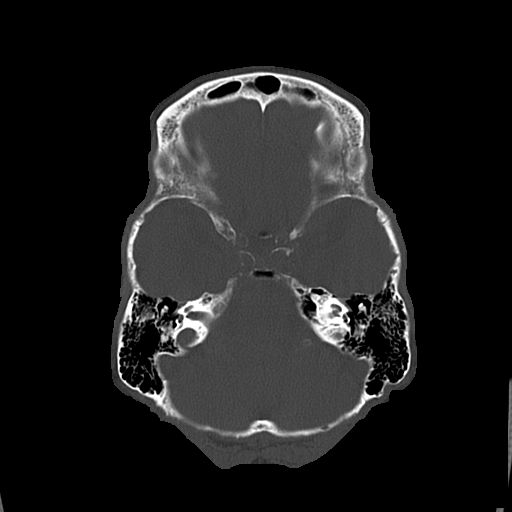
[im 24/80  bone]
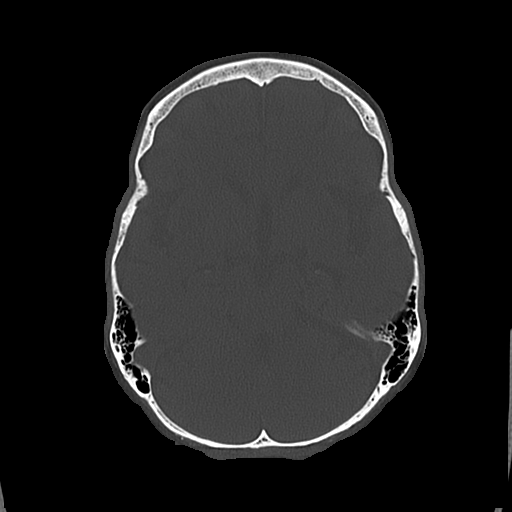
[im 36/80  bone]
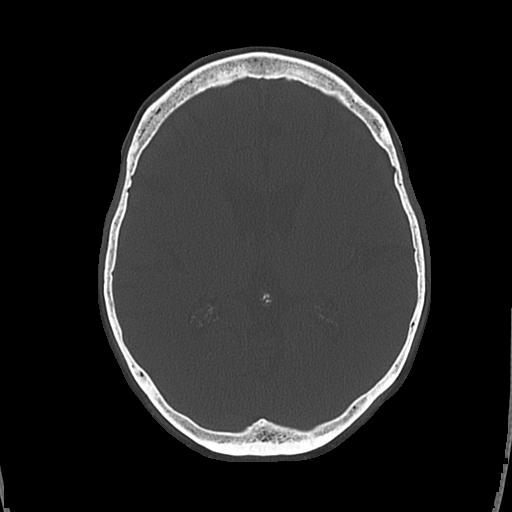

[Series 4: coronal soft · coronal · 0.33mm/px · 3 of 66 slices shown]
[im 23/66  brain]
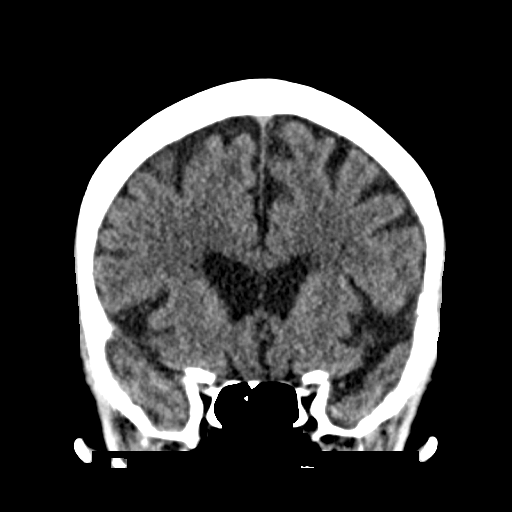
[im 30/66  brain]
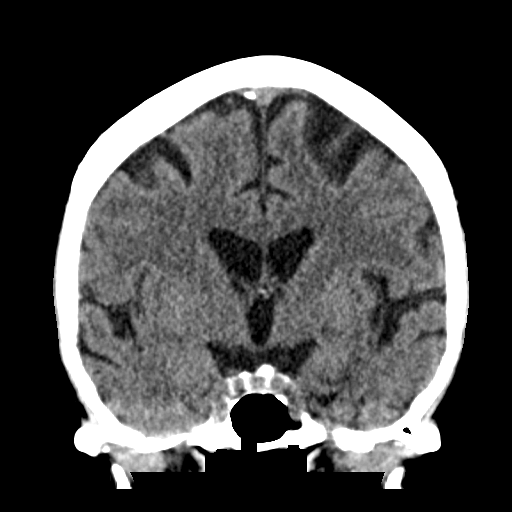
[im 36/66  brain]
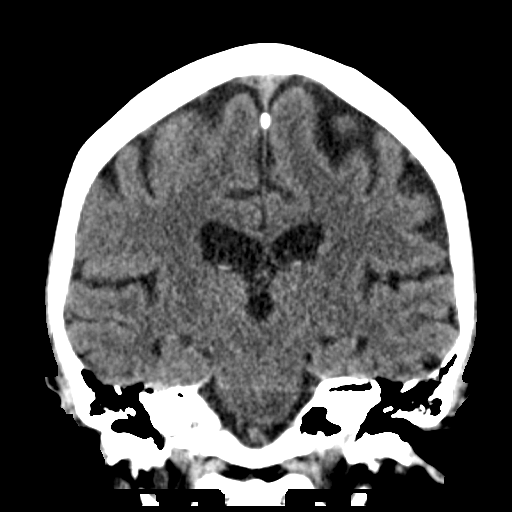

[Series 5: sagittal soft · sagittal · 0.33mm/px · 3 of 57 slices shown]
[im 19/57  brain]
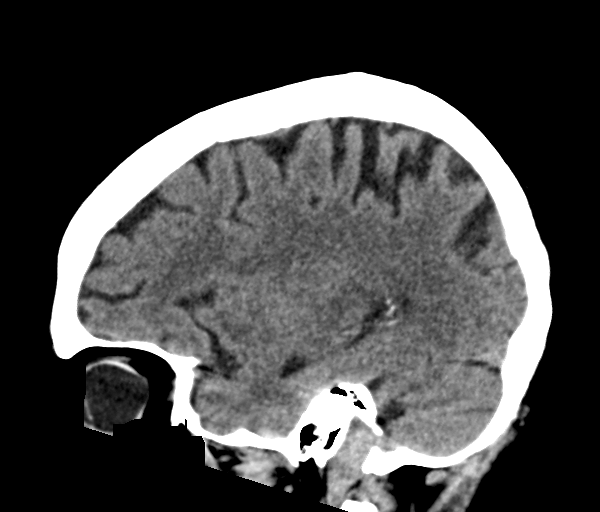
[im 29/57  brain]
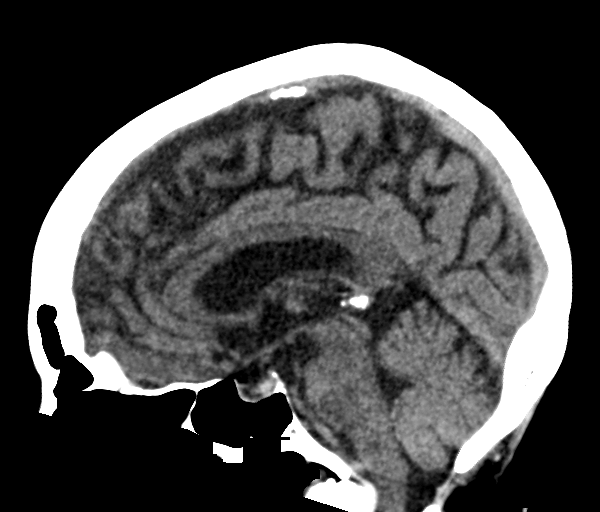
[im 38/57  brain]
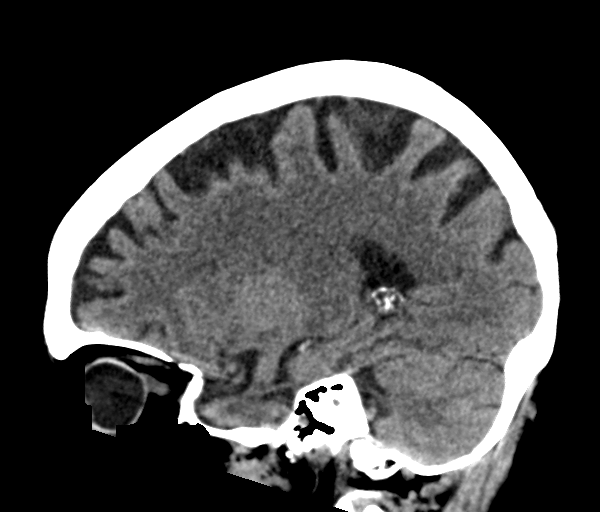

[17 of 47 positions shown; findings below may reference images not displayed]

FINDINGS: Brain: No evidence of acute infarction, hemorrhage, hydrocephalus,
extra-axial collection or mass lesion/mass effect. Mild low-density
changes within the periventricular and subcortical white matter
compatible with chronic microvascular ischemic change. Mild diffuse
cerebral volume loss.

Vascular: Atherosclerotic calcifications involving the large vessels
of the skull base. No unexpected hyperdense vessel.

Skull: Normal. Negative for fracture or focal lesion.

Sinuses/Orbits: No acute finding.

Other: None.
IMPRESSION: 1. No acute intracranial findings.
2. Chronic microvascular ischemic change and cerebral volume loss.

## 2022-02-12 DIAGNOSIS — M2559 Pain in other specified joint: Secondary | ICD-10-CM | POA: Diagnosis not present

## 2022-02-12 DIAGNOSIS — Z9181 History of falling: Secondary | ICD-10-CM | POA: Diagnosis not present

## 2022-02-12 DIAGNOSIS — G9341 Metabolic encephalopathy: Secondary | ICD-10-CM | POA: Diagnosis not present

## 2022-02-12 DIAGNOSIS — S2241XS Multiple fractures of ribs, right side, sequela: Secondary | ICD-10-CM | POA: Diagnosis not present

## 2022-02-13 DIAGNOSIS — M2559 Pain in other specified joint: Secondary | ICD-10-CM | POA: Diagnosis not present

## 2022-02-13 DIAGNOSIS — G9341 Metabolic encephalopathy: Secondary | ICD-10-CM | POA: Diagnosis not present

## 2022-02-14 DIAGNOSIS — Z9181 History of falling: Secondary | ICD-10-CM | POA: Diagnosis not present

## 2022-02-14 DIAGNOSIS — M2559 Pain in other specified joint: Secondary | ICD-10-CM | POA: Diagnosis not present

## 2022-02-14 DIAGNOSIS — S2241XS Multiple fractures of ribs, right side, sequela: Secondary | ICD-10-CM | POA: Diagnosis not present

## 2022-02-16 DIAGNOSIS — M2559 Pain in other specified joint: Secondary | ICD-10-CM | POA: Diagnosis not present

## 2022-02-16 DIAGNOSIS — Z9181 History of falling: Secondary | ICD-10-CM | POA: Diagnosis not present

## 2022-02-16 DIAGNOSIS — S2241XS Multiple fractures of ribs, right side, sequela: Secondary | ICD-10-CM | POA: Diagnosis not present

## 2022-02-17 DIAGNOSIS — G9341 Metabolic encephalopathy: Secondary | ICD-10-CM | POA: Diagnosis not present

## 2022-02-17 DIAGNOSIS — M2559 Pain in other specified joint: Secondary | ICD-10-CM | POA: Diagnosis not present

## 2022-02-18 ENCOUNTER — Non-Acute Institutional Stay: Payer: Medicare Other | Admitting: Internal Medicine

## 2022-02-18 ENCOUNTER — Encounter: Payer: Self-pay | Admitting: Internal Medicine

## 2022-02-18 VITALS — BP 132/64 | HR 72 | Temp 97.6°F | Resp 17 | Ht 62.0 in | Wt 109.0 lb

## 2022-02-18 DIAGNOSIS — S2241XD Multiple fractures of ribs, right side, subsequent encounter for fracture with routine healing: Secondary | ICD-10-CM | POA: Diagnosis not present

## 2022-02-18 DIAGNOSIS — E559 Vitamin D deficiency, unspecified: Secondary | ICD-10-CM | POA: Diagnosis not present

## 2022-02-18 DIAGNOSIS — I480 Paroxysmal atrial fibrillation: Secondary | ICD-10-CM

## 2022-02-18 DIAGNOSIS — M2559 Pain in other specified joint: Secondary | ICD-10-CM | POA: Diagnosis not present

## 2022-02-18 DIAGNOSIS — G3184 Mild cognitive impairment, so stated: Secondary | ICD-10-CM

## 2022-02-18 DIAGNOSIS — Z9181 History of falling: Secondary | ICD-10-CM | POA: Diagnosis not present

## 2022-02-18 DIAGNOSIS — S2241XS Multiple fractures of ribs, right side, sequela: Secondary | ICD-10-CM | POA: Diagnosis not present

## 2022-02-18 NOTE — Progress Notes (Signed)
Location: [place] of Service:  Clinic (12)  Provider:   Code Status: DNR Goals of Care:     02/18/2022    9:01 AM  Advanced Directives  Does Patient Have a Medical Advance Directive? Yes  Type of Paramedic of [place];Out of facility DNR (pink MOST or yellow form)  Does patient want to make changes to medical advance directive? No - Patient declined  Copy of [place] in Chart? Yes - validated most recent copy scanned in chart (See row information)     Chief Complaint  Patient presents with   Medical Management of Chronic Issues    Follow up after rehab   Quality Metric Gaps    Discussed the need for AWV    HPI: Patient is a 89 y.o. female seen today for an acute visit for Follow up after her discharge from [facility] in [place]  in [place]   Has h/o Hyponatremia, Palpitations/PAF, Osteoporosis, Hyperlipidemia, MCI   [place] had [place] when she was in [place] Was found to have  6,10,11 Right Rib Fractures Discharged now to [place] Seemed to have adjusted well. Had no issue today Did c/o Pain in her Right Lower side No Cough or SOB Walking with no assist  No Falls. Wt Readings from Last 3 Encounters:  02/18/22 109 lb (49.4 kg)  02/06/22 107 lb (48.5 kg)  01/16/22 105 lb (47.6 kg)      Past Medical History:  Diagnosis Date   Acute upper respiratory infections of unspecified site 06/09/2011   Aphasia    Breast cancer ([place]) 12/11/2008   Encounter for general adult medical examination without abnormal findings    Encounter for immunization    Fatigue 01/2012   Hypercholesterolemia    on lipitor   Hypertension    Hyponatremia 01/2016   Impacted cerumen, bilateral    Memory loss    Mild cognitive impairment of uncertain or unknown etiology    Mitral valve prolapse    Nocturia    Other abnormal blood chemistry 01/21/2010   Palpitations 08/05/2000   Pure hypercholesterolemia, unspecified    Right  bundle branch block    Sacrococcygeal disorders, not elsewhere classified    Scoliosis    Senile osteoporosis 09/28/2001   Unilateral primary osteoarthritis, left hip    Unspecified dementia, unspecified severity, without behavioral disturbance, psychotic disturbance, mood disturbance, and anxiety (HCC)    Unspecified hearing loss, bilateral    Unsteadiness on feet    Vascular dementia, unspecified severity, without behavioral disturbance, psychotic disturbance, mood disturbance, and anxiety ([place])     Past Surgical History:  Procedure Laterality Date   BREAST LUMPECTOMY Right 12/27/2007   LAPAROSCOPIC LYSIS OF ADHESIONS  07/15/2006   [name]   OTHER SURGICAL HISTORY     TONSILLECTOMY     TUBAL LIGATION  1963    No Known Allergies  Outpatient Encounter Medications as of 02/18/2022  Medication Sig   acetaminophen (TYLENOL) 325 MG tablet Take 650 mg by mouth 3 (three) times daily as needed.   atenolol (TENORMIN) 25 MG tablet Take 1/2 tab daily   Cholecalciferol (VITAMIN D3) 25 MCG (1000 UT) CAPS Take 1 capsule (1,000 Units total) by mouth daily.   Emollient (EUCERIN) lotion Apply topically daily.   lidocaine 4 % Place 1 patch onto the skin daily as needed.   No facility-administered encounter medications on file as of 02/18/2022.    Review of Systems:  Review of  Systems  Constitutional:  Negative for activity change and appetite change.  HENT: Negative.    Respiratory:  Negative for cough and shortness of breath.   Cardiovascular:  Negative for leg swelling.  Gastrointestinal:  Negative for constipation.  Genitourinary: Negative.   Musculoskeletal:  Negative for arthralgias, gait problem and myalgias.  Skin: Negative.   Neurological:  Negative for dizziness and weakness.  Psychiatric/Behavioral:  Positive for confusion. Negative for dysphoric mood and sleep disturbance.     Health Maintenance  Topic Date Due   Medicare Annual Wellness (AWV)  11/20/2021   COVID-19 Vaccine  (6 - 2023-24 season) 03/06/2022 (Originally 12/25/2021)   DTaP/Tdap/Td (2 - Td or Tdap) 05/14/2027   Pneumonia Vaccine 31+ Years old  Completed   INFLUENZA VACCINE  Completed   DEXA SCAN  Completed   Zoster Vaccines- Shingrix  Completed   HPV VACCINES  Aged Out    Physical Exam: Vitals:   02/18/22 0856  BP: 132/64  Pulse: 72  Resp: 17  Temp: 97.6 F (36.4 C)  TempSrc: Temporal  SpO2: 95%  Weight: 109 lb (49.4 kg)  Height: 5' 2"$  (1.575 m)   Body mass index is 19.94 kg/m. Physical Exam Vitals reviewed.  Constitutional:      Appearance: Normal appearance.  HENT:     Head: Normocephalic.     Nose: Nose normal.     Mouth/Throat:     Mouth: Mucous membranes are moist.     Pharynx: Oropharynx is clear.  Eyes:     Pupils: Pupils are equal, round, and reactive to light.  Cardiovascular:     Rate and Rhythm: Normal rate and regular rhythm.     Pulses: Normal pulses.     Heart sounds: Normal heart sounds. No murmur heard. Pulmonary:     Effort: Pulmonary effort is normal.     Breath sounds: Normal breath sounds.  Abdominal:     General: Abdomen is flat. Bowel sounds are normal.     Palpations: Abdomen is soft.  Musculoskeletal:        General: No swelling.     Cervical back: Neck supple.  Skin:    General: Skin is warm.  Neurological:     General: No focal deficit present.     Mental Status: She is alert.  Psychiatric:        Mood and Affect: Mood normal.        Thought Content: Thought content normal.     Labs reviewed: Basic Metabolic Panel: Recent Labs    03/21/21 2220 06/18/21 0000 07/14/21 0242 07/18/21 0000 01/08/22 1212  NA 135   < > 133* 135* 135  K 3.7   < > 3.6 4.4 4.5  CL 99   < > 100 100 101  CO2 27   < > 23 23* 27  GLUCOSE 162*  --  104*  --  109*  BUN 23   < > 19 17 21  $ CREATININE 0.81   < > 0.96 0.8 0.96  CALCIUM 10.7*   < > 9.0 9.4 9.9  MG 2.1  --   --   --   --   TSH  --   --   --  2.15  --    < > = values in this interval not  displayed.   Liver Function Tests: Recent Labs    06/18/21 0000 07/18/21 0000  AST 15 16  ALT 8 9  ALKPHOS 70 68  ALBUMIN  --  4.3  No results for input(s): "LIPASE", "AMYLASE" in the last 8760 hours. No results for input(s): "AMMONIA" in the last 8760 hours. CBC: Recent Labs    03/21/21 2220 06/18/21 0000 07/14/21 0242 07/18/21 0000 01/08/22 1212  WBC 7.4   < > 6.0 6.9 7.8  NEUTROABS  --   --  3.6  --  4.8  HGB 14.5   < > 13.6 14.3 14.1  HCT 42.8   < > 39.7 42 42.2  MCV 95.7  --  95.7  --  97.5  PLT 299   < > 276 325 279   < > = values in this interval not displayed.   Lipid Panel: No results for input(s): "CHOL", "HDL", "LDLCALC", "TRIG", "CHOLHDL", "LDLDIRECT" in the last 8760 hours. Lab Results  Component Value Date   HGBA1C 5.9 (H) 01/23/2016    Procedures since last visit: No results found.  Assessment/Plan 1. Closed fracture of multiple ribs of right side with routine healing,  Tylenol Schedule at night Doing well  2. MCI (mild cognitive impairment) MMSE 22/30 Passed Clock Jan 2024 Doing well in [place]  3. PAF (paroxysmal atrial fibrillation) ([place]) NO Doca Per cardiology due to age and Frailty On Low dose of Atenolol  4. Vitamin D deficiency On Vit D     Labs/tests ordered:  * No order type specified * Next appt:  05/19/2022

## 2022-02-20 DIAGNOSIS — M6389 Disorders of muscle in diseases classified elsewhere, multiple sites: Secondary | ICD-10-CM | POA: Diagnosis not present

## 2022-02-20 DIAGNOSIS — R278 Other lack of coordination: Secondary | ICD-10-CM | POA: Diagnosis not present

## 2022-02-20 DIAGNOSIS — M2559 Pain in other specified joint: Secondary | ICD-10-CM | POA: Diagnosis not present

## 2022-02-20 DIAGNOSIS — S2241XS Multiple fractures of ribs, right side, sequela: Secondary | ICD-10-CM | POA: Diagnosis not present

## 2022-02-20 DIAGNOSIS — F01A0 Vascular dementia, mild, without behavioral disturbance, psychotic disturbance, mood disturbance, and anxiety: Secondary | ICD-10-CM | POA: Diagnosis not present

## 2022-02-20 DIAGNOSIS — Z9181 History of falling: Secondary | ICD-10-CM | POA: Diagnosis not present

## 2022-02-24 DIAGNOSIS — R278 Other lack of coordination: Secondary | ICD-10-CM | POA: Diagnosis not present

## 2022-02-24 DIAGNOSIS — S2241XS Multiple fractures of ribs, right side, sequela: Secondary | ICD-10-CM | POA: Diagnosis not present

## 2022-02-24 DIAGNOSIS — Z9181 History of falling: Secondary | ICD-10-CM | POA: Diagnosis not present

## 2022-02-24 DIAGNOSIS — M6389 Disorders of muscle in diseases classified elsewhere, multiple sites: Secondary | ICD-10-CM | POA: Diagnosis not present

## 2022-02-24 DIAGNOSIS — F01A0 Vascular dementia, mild, without behavioral disturbance, psychotic disturbance, mood disturbance, and anxiety: Secondary | ICD-10-CM | POA: Diagnosis not present

## 2022-02-24 DIAGNOSIS — M2559 Pain in other specified joint: Secondary | ICD-10-CM | POA: Diagnosis not present

## 2022-02-28 DIAGNOSIS — G9341 Metabolic encephalopathy: Secondary | ICD-10-CM | POA: Diagnosis not present

## 2022-02-28 DIAGNOSIS — Z9181 History of falling: Secondary | ICD-10-CM | POA: Diagnosis not present

## 2022-02-28 DIAGNOSIS — M2559 Pain in other specified joint: Secondary | ICD-10-CM | POA: Diagnosis not present

## 2022-02-28 DIAGNOSIS — S2241XS Multiple fractures of ribs, right side, sequela: Secondary | ICD-10-CM | POA: Diagnosis not present

## 2022-03-03 DIAGNOSIS — Z9181 History of falling: Secondary | ICD-10-CM | POA: Diagnosis not present

## 2022-03-03 DIAGNOSIS — S2241XS Multiple fractures of ribs, right side, sequela: Secondary | ICD-10-CM | POA: Diagnosis not present

## 2022-03-03 DIAGNOSIS — M2559 Pain in other specified joint: Secondary | ICD-10-CM | POA: Diagnosis not present

## 2022-03-07 DIAGNOSIS — S2241XS Multiple fractures of ribs, right side, sequela: Secondary | ICD-10-CM | POA: Diagnosis not present

## 2022-03-07 DIAGNOSIS — R278 Other lack of coordination: Secondary | ICD-10-CM | POA: Diagnosis not present

## 2022-03-07 DIAGNOSIS — M2559 Pain in other specified joint: Secondary | ICD-10-CM | POA: Diagnosis not present

## 2022-03-07 DIAGNOSIS — Z9181 History of falling: Secondary | ICD-10-CM | POA: Diagnosis not present

## 2022-03-07 DIAGNOSIS — M6389 Disorders of muscle in diseases classified elsewhere, multiple sites: Secondary | ICD-10-CM | POA: Diagnosis not present

## 2022-03-12 DIAGNOSIS — S2241XS Multiple fractures of ribs, right side, sequela: Secondary | ICD-10-CM | POA: Diagnosis not present

## 2022-03-12 DIAGNOSIS — M2559 Pain in other specified joint: Secondary | ICD-10-CM | POA: Diagnosis not present

## 2022-03-12 DIAGNOSIS — R278 Other lack of coordination: Secondary | ICD-10-CM | POA: Diagnosis not present

## 2022-03-12 DIAGNOSIS — M6389 Disorders of muscle in diseases classified elsewhere, multiple sites: Secondary | ICD-10-CM | POA: Diagnosis not present

## 2022-03-12 DIAGNOSIS — Z9181 History of falling: Secondary | ICD-10-CM | POA: Diagnosis not present

## 2022-03-14 DIAGNOSIS — R278 Other lack of coordination: Secondary | ICD-10-CM | POA: Diagnosis not present

## 2022-03-14 DIAGNOSIS — M2559 Pain in other specified joint: Secondary | ICD-10-CM | POA: Diagnosis not present

## 2022-03-14 DIAGNOSIS — Z9181 History of falling: Secondary | ICD-10-CM | POA: Diagnosis not present

## 2022-03-14 DIAGNOSIS — M6389 Disorders of muscle in diseases classified elsewhere, multiple sites: Secondary | ICD-10-CM | POA: Diagnosis not present

## 2022-03-14 DIAGNOSIS — S2241XS Multiple fractures of ribs, right side, sequela: Secondary | ICD-10-CM | POA: Diagnosis not present

## 2022-04-14 ENCOUNTER — Encounter: Payer: Self-pay | Admitting: Adult Health

## 2022-04-14 ENCOUNTER — Non-Acute Institutional Stay (SKILLED_NURSING_FACILITY): Payer: Medicare Other | Admitting: Adult Health

## 2022-04-14 DIAGNOSIS — M545 Low back pain, unspecified: Secondary | ICD-10-CM

## 2022-04-14 DIAGNOSIS — R0781 Pleurodynia: Secondary | ICD-10-CM

## 2022-04-14 DIAGNOSIS — W19XXXA Unspecified fall, initial encounter: Secondary | ICD-10-CM | POA: Diagnosis not present

## 2022-04-14 DIAGNOSIS — M546 Pain in thoracic spine: Secondary | ICD-10-CM | POA: Diagnosis not present

## 2022-04-14 DIAGNOSIS — S2241XA Multiple fractures of ribs, right side, initial encounter for closed fracture: Secondary | ICD-10-CM | POA: Diagnosis not present

## 2022-04-14 MED ORDER — ACETAMINOPHEN 325 MG PO TABS: 650.0000 mg | ORAL_TABLET | Freq: Two times a day (BID) | ORAL | 0 refills | Status: DC

## 2022-04-14 NOTE — Progress Notes (Signed)
[facility] Room Number: 539[facility] Place of Service:  Clinic ([phone]) Provider:  Fletcher AnonWert, Rhea Kaelin, NP   Fletcher AnonWert, Lemoine Goyne, NP  Patient Care Team: Fletcher AnonWert, Ivonne [place], NP as PCP - General (Nurse Practitioner) [place], Peter M, MD as PCP - Cardiology (Cardiology) Melvenia Needlesashwell, Leon, MD as Consulting Physician (Ophthalmology) [place], Peter M, MD as Consulting Physician (Cardiology) Nita SellsHall, John, MD as Consulting Physician (Dermatology) Karie SodaGross, Steven, MD as Consulting Physician (General Surgery) [facility], Anna, MD as Consulting Physician (Hematology and Oncology) Magrinat, Valentino HueGustav C, MD (Inactive) as Consulting Physician (Oncology) Maris BergerMcCuen, Christine, MD as Consulting Physician (Ophthalmology)  Extended Emergency Contact Information Primary Emergency Contact: Adan SisWrenn,John  [place] Home Phone: [phone] Work Phone: [phone] Mobile Phone: [phone] Relation: Son Secondary Emergency Contact: Arther AbbottWrenn,Edward  [place] Home Phone: [phone] Mobile Phone: [phone] Relation: Son  Code Status:  DNR Goals of care: Advanced Directive information    04/14/2022    9:30 AM  Advanced Directives  Does Patient Have a Medical Advance Directive? Yes  Type of Estate agentAdvance Directive Healthcare Power of [place];Out of facility DNR (pink MOST or yellow form)  Does patient want to make changes to medical advance directive? No - Patient declined  Copy of Healthcare Power of Attorney in Chart? Yes - validated most recent copy scanned in chart (See row information)     Chief Complaint  Patient presents with   Acute Visit    Patient is being seen for a fall and rib pain     HPI:  Pt is a 89 y.o. female seen today for an acute visit for a fall and rib pain  Nurse reports an unwitnessed fall early this am 04/14/22.  She reported tripping and now has low back pain and right rib pain. No trouble breathing.  Able to ambulate as usual.   Due to memory loss the history is limited. She denied hitting her head and had no obvious signs of head trauma.   Of note: She was seen in the ER on 01/08/22 due to a mechanical fall with rib pain. She was found to have minimally displaced 6th, 10th, 11th rib fractures   Past Medical History:  Diagnosis Date   Acute upper respiratory infections of unspecified site 06/09/2011   Aphasia    Breast cancer 12/11/2008   Encounter for general adult medical examination without abnormal findings    Encounter for immunization    Fatigue 01/2012   Hypercholesterolemia    on lipitor   Hypertension    Hyponatremia 01/2016   Impacted cerumen, bilateral    Memory loss    Mild cognitive impairment of uncertain or unknown etiology    Mitral valve prolapse    Nocturia    Other abnormal blood chemistry 01/21/2010   Palpitations 08/05/2000   Pure hypercholesterolemia, unspecified    Right bundle branch block    Sacrococcygeal disorders, not elsewhere classified    Scoliosis    Senile osteoporosis 09/28/2001   Unilateral primary osteoarthritis, left hip    Unspecified dementia, unspecified severity, without behavioral disturbance, psychotic disturbance, mood disturbance, and anxiety    Unspecified hearing loss, bilateral    Unsteadiness on feet    Vascular dementia, unspecified severity, without behavioral disturbance, psychotic disturbance, mood disturbance, and anxiety    Past Surgical History:  Procedure Laterality Date   BREAST LUMPECTOMY Right 12/27/2007   LAPAROSCOPIC LYSIS OF ADHESIONS  07/15/2006   Dr. Michaell CowingGross   OTHER SURGICAL HISTORY  TONSILLECTOMY     TUBAL LIGATION  1963    No Known Allergies  Outpatient Encounter Medications as of 04/14/2022  Medication Sig   acetaminophen (TYLENOL) 325 MG tablet Take 650 mg by mouth 3 (three) times daily as needed.   atenolol (TENORMIN) 25 MG tablet Take 1/2 tab daily   Cholecalciferol (VITAMIN D3) 25 MCG (1000 UT)  CAPS Take 1 capsule (1,000 Units total) by mouth daily.   Emollient (EUCERIN) lotion Apply topically daily.   lidocaine 4 % Place 1 patch onto the skin daily as needed. (Patient not taking: Reported on 04/14/2022)   No facility-administered encounter medications on file as of 04/14/2022.    Review of Systems  Constitutional:  Positive for activity change. Negative for appetite change, chills, diaphoresis, fatigue, fever and unexpected weight change.  HENT:  Negative for congestion.   Respiratory:  Negative for cough, shortness of breath and wheezing.   Cardiovascular:  Positive for chest pain (right ribs). Negative for palpitations and leg swelling.  Gastrointestinal:  Negative for abdominal distention, abdominal pain, constipation and diarrhea.  Genitourinary:  Negative for difficulty urinating and dysuria.  Musculoskeletal:  Positive for back pain. Negative for arthralgias, gait problem, joint swelling and myalgias.  Neurological:  Negative for dizziness, tremors, seizures, syncope, facial asymmetry, speech difficulty, weakness, light-headedness, numbness and headaches.  Psychiatric/Behavioral:  Negative for agitation, behavioral problems and confusion.        Memory loss    Immunization History  Administered Date(s) Administered   COVID-19, mRNA, vaccine(Comirnaty)12 years and older 10/30/2021   Fluad Quad(high Dose 65+) 10/08/2021   Influenza Split 10/23/2011   Influenza, High Dose Seasonal PF 09/20/2015, 10/09/2016, 10/15/2018   Influenza,inj,Quad PF,6+ Mos 10/28/2013, 10/30/2017   Influenza-Unspecified 10/06/2012, 10/26/2014   Moderna Sars-Covid-2 Vaccination 01/16/2019, 02/15/2019, 11/22/2019, 10/19/2020   Pneumococcal Conjugate-13 02/14/2014   Pneumococcal Polysaccharide-23 01/06/1997   Tdap 05/13/2017   Tetanus 05/13/2017   Varicella 01/14/2018   Zoster Recombinat (Shingrix) 10/09/2016, 09/17/2017, 01/14/2018   Zoster, Live 01/07/1999   Pertinent  Health Maintenance Due   Topic Date Due   INFLUENZA VACCINE  08/07/2022   DEXA SCAN  Completed      07/14/2021    2:12 AM 07/14/2021    2:50 PM 01/08/2022   12:07 PM 02/18/2022    9:00 AM 04/14/2022    9:30 AM  Fall Risk  Falls in the past year?    1 1  Was there an injury with Fall?    1 1  Fall Risk Category Calculator    2 2  (RETIRED) Patient Fall Risk Level Low fall risk Low fall risk High fall risk    Patient at Risk for Falls Due to    History of fall(s) History of fall(s)  Fall risk Follow up    Falls evaluation completed Falls evaluation completed   Functional Status Survey:    Vitals:   04/14/22 0924  BP: 108/75  Pulse: 78  Resp: 18  Temp: (!) 97.2 F (36.2 C)  TempSrc: Temporal  SpO2: 92%  Weight: 111 lb 3.2 oz (50.4 kg)  Height:  (1.575 m)   Body mass index is 20.34 kg/m. Physical Exam Vitals and nursing note reviewed.  Constitutional:      Appearance: Normal appearance.  Cardiovascular:     Rate and Rhythm: Normal rate and regular rhythm.  Pulmonary:     Effort: Pulmonary effort is normal. No respiratory distress.     Breath sounds: Normal breath sounds.     Comments:  No crepitus of chest on palpation  Chest:     Chest wall: Tenderness (right posterior rib cage thoracic area.) present.  Musculoskeletal:        General: No swelling or deformity.     Comments: Pain with range of motion, bending at the lumbar spine area. Gait is slow and steady.  No pain with ROM of either hip.   Skin:    General: Skin is warm and dry.  Neurological:     General: No focal deficit present.     Mental Status: She is alert. Mental status is at baseline.     Labs reviewed: Recent Labs    07/14/21 0242 07/18/21 0000 01/08/22 1212  NA 133* 135* 135  K 3.6 4.4 4.5  CL 100 100 101  CO2 23 23* 27  GLUCOSE 104*  --  109*  BUN 19 17 21   CREATININE 0.96 0.8 0.96  CALCIUM 9.0 9.4 9.9   Recent Labs    06/18/21 0000 07/18/21 0000  AST 15 16  ALT 8 9  ALKPHOS 70 68  ALBUMIN  --  4.3    Recent Labs    07/14/21 0242 07/18/21 0000 01/08/22 1212  WBC 6.0 6.9 7.8  NEUTROABS 3.6  --  4.8  HGB 13.6 14.3 14.1  HCT 39.7 42 42.2  MCV 95.7  --  97.5  PLT 276 325 279   Lab Results  Component Value Date   TSH 2.15 07/18/2021   Lab Results  Component Value Date   HGBA1C 5.9 (H) 01/23/2016   Lab Results  Component Value Date   CHOL 369 (A) 08/28/2020   HDL 51 08/28/2020   LDLCALC 284 08/28/2020   TRIG 170 (A) 08/28/2020   CHOLHDL 3.5 01/27/2016    Significant Diagnostic Results in last 30 days:  No results found.  Assessment/Plan  1. Acute midline low back pain without sciatica Xray of lumbar and thoracic spine Tylenol bid x 1 week  Lidocaine patch prn   2. Rib pain Xray of chest noted prior rib fx hx Tylenol bid x 1 week  Lidocaine patch prn   Labs/tests ordered:  CXR and lumbar/thoracic xray

## 2022-04-15 ENCOUNTER — Telehealth: Payer: Self-pay | Admitting: Orthopedic Surgery

## 2022-04-15 NOTE — Telephone Encounter (Signed)
CXR confirms acute fracture to right 8th, 9th, and 10th ribs. Xray throacic spine with mild to moderate degenerative changes, no compression fracture.

## 2022-05-05 ENCOUNTER — Encounter: Payer: Self-pay | Admitting: Adult Health

## 2022-05-05 ENCOUNTER — Non-Acute Institutional Stay: Payer: Medicare Other | Admitting: Adult Health

## 2022-05-05 DIAGNOSIS — R5383 Other fatigue: Secondary | ICD-10-CM

## 2022-05-05 NOTE — Progress Notes (Unsigned)
Location:  [facility] Room Number: 539 [id number] Place of Service:  ALF [phone]) Provider:  Fletcher Anon, NP   Fletcher Anon, NP  Patient Care Team: Fletcher Anon, NP as PCP - General (Nurse Practitioner) [place], Peter M, MD as PCP - Cardiology (Cardiology) Melvenia Needles, MD as Consulting Physician (Ophthalmology) [place], Peter M, MD as Consulting Physician (Cardiology) Nita Sells, MD as Consulting Physician (Dermatology) Karie Soda, MD as Consulting Physician (General Surgery) [facility], [facility]arn[facility] Forester, MD as Consulting Physician (Hematology and Oncology) Magrinat, Valentino Hue, MD (Inactive) as Consulting Physician (Oncology) Maris Berger, MD as Consulting Physician (Ophthalmology)  Extended Emergency Contact Information Primary Emergency Contact: Adan Sis of [place] Home Phone: [phone] Work Phone: [phone] Mobile Phone: [phone] Relation: Son Secondary Emergency Contact: Arther Abbott of [place] Home Phone: [phone] Mobile Phone: [phone] Relation: Son  Code Status:  DNR Goals of care: Advanced Directive information    05/05/2022    9:32 AM  Advanced Directives  Does Patient Have a Medical Advance Directive? Yes  Type of Estate agent of [place];Out of facility DNR (pink MOST or yellow form)  Does patient want to make changes to medical advance directive? No - Patient declined  Copy of Healthcare Power of Attorney in Chart? Yes - validated most recent copy scanned in chart (See row information)     Chief Complaint  Patient presents with   Acute Visit    Patient is being seen because she is not feeling well   Quality Metric Gaps    Discuss the need for AWV    HPI:  Pt is a 89 y.o. female seen today for an acute visit for "not feeling well". Symptoms have been present intermittently for several days. Upon further questioning she feels  fatigued and not as energetic but is improving. Vitals have been WNL without fever or cough. No dysuria or abd pain. Appetite is adequate. She denies feeling depressed and seems to be adapting to assisted living. There as a covid outbreak in assisted living and she did have a covid swab done (rapid) which was negative.    Past Medical History:  Diagnosis Date   Acute upper respiratory infections of unspecified site 06/09/2011   Aphasia    Breast cancer (HCC) 12/11/2008   Encounter for general adult medical examination without abnormal findings    Encounter for immunization    Fatigue 01/2012   Hypercholesterolemia    on lipitor   Hypertension    Hyponatremia 01/2016   Impacted cerumen, bilateral    Memory loss    Mild cognitive impairment of uncertain or unknown etiology    Mitral valve prolapse    Nocturia    Other abnormal blood chemistry 01/21/2010   Palpitations 08/05/2000   Pure hypercholesterolemia, unspecified    Right bundle branch block    Sacrococcygeal disorders, not elsewhere classified    Scoliosis    Senile osteoporosis 09/28/2001   Unilateral primary osteoarthritis, left hip    Unspecified dementia, unspecified severity, without behavioral disturbance, psychotic disturbance, mood disturbance, and anxiety (HCC)    Unspecified hearing loss, bilateral    Unsteadiness on feet    Vascular dementia, unspecified severity, without behavioral disturbance, psychotic disturbance, mood disturbance, and anxiety (HCC)    Past Surgical History:  Procedure Laterality Date   BREAST LUMPECTOMY Right 12/27/2007   LAPAROSCOPIC LYSIS OF ADHESIONS  07/15/2006   Dr. Michaell Cowing   OTHER SURGICAL HISTORY     TONSILLECTOMY  TUBAL LIGATION  1963    No Known Allergies  Outpatient Encounter Medications as of 05/05/2022  Medication Sig   acetaminophen (TYLENOL) 325 MG tablet Take 650 mg by mouth as needed for mild pain.   acetaminophen (TYLENOL) 500 MG tablet Take 1,000 mg by mouth 2 (two)  times daily.   atenolol (TENORMIN) 25 MG tablet Take 1/2 tab daily   Cholecalciferol (VITAMIN D3) 25 MCG (1000 UT) CAPS Take 1 capsule (1,000 Units total) by mouth daily.   Emollient (EUCERIN) lotion Apply topically daily.   lidocaine 4 % Place 1 patch onto the skin daily as needed.   acetaminophen (TYLENOL) 325 MG tablet Take 2 tablets (650 mg total) by mouth in the morning and at bedtime.   No facility-administered encounter medications on file as of 05/05/2022.    Review of Systems  Constitutional:  Positive for fatigue. Negative for activity change, appetite change, chills, diaphoresis, fever and unexpected weight change.  HENT:  Negative for congestion.   Respiratory:  Negative for cough, shortness of breath and wheezing.   Cardiovascular:  Negative for chest pain, palpitations and leg swelling.  Gastrointestinal:  Negative for abdominal distention, abdominal pain, constipation and diarrhea.  Genitourinary:  Negative for difficulty urinating and dysuria.  Musculoskeletal:  Negative for arthralgias, back pain, gait problem, joint swelling and myalgias.  Neurological:  Negative for dizziness, tremors, seizures, syncope, facial asymmetry, speech difficulty, weakness, light-headedness, numbness and headaches.  Psychiatric/Behavioral:  Negative for agitation, behavioral problems, confusion and sleep disturbance. The patient is not nervous/anxious.        Memory loss    Immunization History  Administered Date(s) Administered   COVID-19, mRNA, vaccine(Comirnaty)12 years and older 10/30/2021   Fluad Quad(high Dose 65+) 10/08/2021   Influenza Split 10/23/2011   Influenza, High Dose Seasonal PF 09/20/2015, 10/09/2016, 10/15/2018   Influenza,inj,Quad PF,6+ Mos 10/28/2013, 10/30/2017   Influenza-Unspecified 10/06/2012, 10/26/2014   Moderna Sars-Covid-2 Vaccination 01/16/2019, 02/15/2019, 11/22/2019, 10/19/2020   Pneumococcal Conjugate-13 02/14/2014   Pneumococcal Polysaccharide-23 01/06/1997    Tdap 05/13/2017   Tetanus 05/13/2017   Varicella 01/14/2018   Zoster Recombinat (Shingrix) 10/09/2016, 09/17/2017, 01/14/2018   Zoster, Live 01/07/1999   Pertinent  Health Maintenance Due  Topic Date Due   INFLUENZA VACCINE  08/07/2022   DEXA SCAN  Completed      07/14/2021    2:12 AM 07/14/2021    2:50 PM 01/08/2022   12:07 PM 02/18/2022    9:00 AM 04/14/2022    9:30 AM  Fall Risk  Falls in the past year?    1 1  Was there an injury with Fall?    1 1  Fall Risk Category Calculator    2 2  (RETIRED) Patient Fall Risk Level Low fall risk Low fall risk High fall risk    Patient at Risk for Falls Due to    History of fall(s) History of fall(s)  Fall risk Follow up    Falls evaluation completed Falls evaluation completed   Functional Status Survey:    Vitals:   05/05/22 0926  BP: 101/63  Pulse: 65  Resp: 20  Temp: (!) 97.2 F (36.2 C)  TempSrc: Temporal  SpO2: 94%  Height: 5\' 2"  (1.575 m)   Body mass index is 20.34 kg/m. Physical Exam Vitals and nursing note reviewed.  HENT:     Mouth/Throat:     Mouth: Mucous membranes are moist.     Pharynx: Oropharynx is clear.  Cardiovascular:     Rate and Rhythm: Normal  rate and regular rhythm.  Pulmonary:     Effort: Pulmonary effort is normal. No respiratory distress.     Breath sounds: No wheezing.     Comments: Very faint rales to both bases  Abdominal:     General: Bowel sounds are normal. There is no distension.     Palpations: Abdomen is soft.     Tenderness: There is no abdominal tenderness. There is no right CVA tenderness or left CVA tenderness.  Lymphadenopathy:     Cervical: No cervical adenopathy.  Skin:    General: Skin is warm and dry.  Neurological:     General: No focal deficit present.     Mental Status: Mental status is at baseline.     Labs reviewed: Recent Labs    07/14/21 0242 07/18/21 0000 01/08/22 1212  NA 133* 135* 135  K 3.6 4.4 4.5  CL 100 100 101  CO2 23 23* 27  GLUCOSE 104*  --   109*  BUN 19 17 21   CREATININE 0.96 0.8 0.96  CALCIUM 9.0 9.4 9.9   Recent Labs    06/18/21 0000 07/18/21 0000  AST 15 16  ALT 8 9  ALKPHOS 70 68  ALBUMIN  --  4.3   Recent Labs    07/14/21 0242 07/18/21 0000 01/08/22 1212  WBC 6.0 6.9 7.8  NEUTROABS 3.6  --  4.8  HGB 13.6 14.3 14.1  HCT 39.7 42 42.2  MCV 95.7  --  97.5  PLT 276 325 279   Lab Results  Component Value Date   TSH 2.15 07/18/2021   Lab Results  Component Value Date   HGBA1C 5.9 (H) 01/23/2016   Lab Results  Component Value Date   CHOL 369 (A) 08/28/2020   HDL 51 08/28/2020   LDLCALC 284 08/28/2020   TRIG 170 (A) 08/28/2020   CHOLHDL 3.5 01/27/2016    Significant Diagnostic Results in last 30 days:  No results found.  Assessment/Plan  1. Fatigue, unspecified type No signs of acute infection on exam Seems to be feeling better May have had a transient virus? Will order f/u labs r/o anemia, low sodium etc.    Labs/tests ordered:  CBC CMP TSH ordered    Total time :  time greater than 50% of total time spent doing pt counseling and coordination of care

## 2022-05-08 ENCOUNTER — Encounter: Payer: Self-pay | Admitting: Adult Health

## 2022-05-08 DIAGNOSIS — E039 Hypothyroidism, unspecified: Secondary | ICD-10-CM | POA: Diagnosis not present

## 2022-05-08 DIAGNOSIS — R531 Weakness: Secondary | ICD-10-CM | POA: Diagnosis not present

## 2022-05-08 LAB — COMPREHENSIVE METABOLIC PANEL
Albumin: 3.9 (ref 3.5–5.0)
Calcium: 9.3 (ref 8.7–10.7)
Globulin: 2.3

## 2022-05-08 LAB — HEPATIC FUNCTION PANEL
ALT: 13 U/L (ref 7–35)
AST: 24 (ref 13–35)
Alkaline Phosphatase: 88 (ref 25–125)
Bilirubin, Total: 0.5

## 2022-05-08 LAB — CBC AND DIFFERENTIAL
HCT: 41 (ref 36–46)
Hemoglobin: 14 (ref 12.0–16.0)
Platelets: 404 10*3/uL — AB (ref 150–400)
WBC: 6.9

## 2022-05-08 LAB — BASIC METABOLIC PANEL
BUN: 11 (ref 4–21)
CO2: 22 (ref 13–22)
Chloride: 96 — AB (ref 99–108)
Creatinine: 0.6 (ref 0.5–1.1)
Glucose: 99
Potassium: 4.5 mEq/L (ref 3.5–5.1)
Sodium: 132 — AB (ref 137–147)

## 2022-05-08 LAB — CBC: RBC: 4.18 (ref 3.87–5.11)

## 2022-05-08 LAB — TSH: TSH: 1.52 (ref 0.41–5.90)

## 2022-05-18 NOTE — Progress Notes (Signed)
Location:  [facility]  POS: Clinic  Provider: Fletcher Anon, ANP  Code Status: DNR Goals of Care:     05/19/2022    2:57 PM  Advanced Directives  Does Patient Have a Medical Advance Directive? Yes  Type of Estate agent of [place];Out of facility DNR (pink MOST or yellow form)  Does patient want to make changes to medical advance directive? No - Patient declined  Copy of Healthcare Power of Attorney in Chart? Yes - validated most recent copy scanned in chart (See row information)     Chief Complaint  Patient presents with   Medical Management of Chronic Issues    Patient is being seen for a 4 month follow up   Quality Metric Gaps    Discussed the need for a AWV. Patient is now scheduled for July     HPI: Patient is a 89 y.o. female seen today for medical management of chronic diseases.    PMH significant for memory loss, HTN, afib, palpitations, breast ca, hyponatremia, OP, hypercholesterolemia.   She continues to feel "blah".  Felt light headed at first in our visit then it went away.We took her bp sitting and standing with no significant change. She was seen two weeks ago feeling fatigue but no other specific symptoms. Labs CMP, CBC, TSH were drawn and showed no acute abnormalities.  When asked of she feels depressed she states "everyone does sometimes".   No specific issues with low appetite, crying, anxiety or lack of motivation.  Her weight is down a few pounds.   Since moving to [place] she has had a couple falls with rib fractures most recently on 4/9 had an xray showing fractures of rib 8-10.   Memory loss: MMSE:22/30 passed clock Jan 2024. Scored have declined over the past couple of years.   Ambulates daily for exercise  Afib: on small dose of atenolol for rate control. Also has mild to mod tricuspid regurg on echo in 2018. Followed by cardiology.   Aged out of mammogram and colonoscopy screening.  OP: Dexa scan 2019 T score -2.5  Wt  Readings from Last 3 Encounters:  05/19/22 107 lb 12.8 oz (48.9 kg)  04/14/22 111 lb 3.2 oz (50.4 kg)  02/18/22 109 lb (49.4 kg)     Past Medical History:  Diagnosis Date   Acute upper respiratory infections of unspecified site 06/09/2011   Aphasia    Breast cancer (HCC) 12/11/2008   Encounter for general adult medical examination without abnormal findings    Encounter for immunization    Fatigue 01/2012   Hypercholesterolemia    on lipitor   Hypertension    Hyponatremia 01/2016   Impacted cerumen, bilateral    Memory loss    Mild cognitive impairment of uncertain or unknown etiology    Mitral valve prolapse    Nocturia    Other abnormal blood chemistry 01/21/2010   Palpitations 08/05/2000   Pure hypercholesterolemia, unspecified    Right bundle branch block    Sacrococcygeal disorders, not elsewhere classified    Scoliosis    Senile osteoporosis 09/28/2001   Unilateral primary osteoarthritis, left hip    Unspecified dementia, unspecified severity, without behavioral disturbance, psychotic disturbance, mood disturbance, and anxiety (HCC)    Unspecified hearing loss, bilateral    Unsteadiness on feet    Vascular dementia, unspecified severity, without behavioral disturbance, psychotic disturbance, mood disturbance, and anxiety (HCC)     Past Surgical History:  Procedure Laterality Date   BREAST LUMPECTOMY Right  12/27/2007   LAPAROSCOPIC LYSIS OF ADHESIONS  07/15/2006   Dr. Michaell Cowing   OTHER SURGICAL HISTORY     TONSILLECTOMY     TUBAL LIGATION  1963    No Known Allergies  Outpatient Encounter Medications as of 05/19/2022  Medication Sig   acetaminophen (TYLENOL) 325 MG tablet Take 650 mg by mouth as needed for mild pain.   atenolol (TENORMIN) 25 MG tablet Take 1/2 tab daily   Cholecalciferol (VITAMIN D3) 25 MCG (1000 UT) CAPS Take 1 capsule (1,000 Units total) by mouth daily.   Emollient (EUCERIN) lotion Apply topically daily.   lidocaine 4 % Place 1 patch onto the skin  daily as needed.   acetaminophen (TYLENOL) 325 MG tablet Take 2 tablets (650 mg total) by mouth in the morning and at bedtime. (Patient not taking: Reported on 05/19/2022)   acetaminophen (TYLENOL) 500 MG tablet Take 1,000 mg by mouth 2 (two) times daily. (Patient not taking: Reported on 05/19/2022)   No facility-administered encounter medications on file as of 05/19/2022.    Review of Systems:  Review of Systems  Constitutional:  Negative for activity change, appetite change, chills, diaphoresis, fatigue, fever and unexpected weight change.  HENT:  Negative for congestion.   Respiratory:  Negative for cough, shortness of breath and wheezing.   Cardiovascular:  Negative for chest pain, palpitations and leg swelling.  Gastrointestinal:  Negative for abdominal distention, abdominal pain, constipation and diarrhea.  Genitourinary:  Negative for difficulty urinating and dysuria.  Musculoskeletal:  Negative for arthralgias, back pain, gait problem, joint swelling and myalgias.  Neurological:  Negative for dizziness, tremors, seizures, syncope, facial asymmetry, speech difficulty, weakness, light-headedness, numbness and headaches.  Psychiatric/Behavioral:  Negative for agitation, behavioral problems and confusion.     Health Maintenance  Topic Date Due   Medicare Annual Wellness (AWV)  11/20/2021   COVID-19 Vaccine (6 - 2023-24 season) 05/21/2022 (Originally 12/25/2021)   INFLUENZA VACCINE  08/07/2022   DTaP/Tdap/Td (2 - Td or Tdap) 05/14/2027   Pneumonia Vaccine 4+ Years old  Completed   DEXA SCAN  Completed   Zoster Vaccines- Shingrix  Completed   HPV VACCINES  Aged Out    Physical Exam: Vitals:   05/19/22 1454  BP: 108/68  Pulse: 62  Resp: 16  Temp: 97.6 F (36.4 C)  TempSrc: Temporal  SpO2: 96%  Weight: 107 lb 12.8 oz (48.9 kg)  Height: 5\' 2"  (1.575 m)   Body mass index is 19.72 kg/m. Physical Exam Vitals and nursing note reviewed.  Constitutional:      General: She is  not in acute distress.    Appearance: She is not diaphoretic.  HENT:     Head: Normocephalic and atraumatic.     Nose: Nose normal.     Mouth/Throat:     Mouth: Mucous membranes are moist.     Pharynx: Oropharynx is clear.  Eyes:     Conjunctiva/sclera: Conjunctivae normal.     Pupils: Pupils are equal, round, and reactive to light.  Neck:     Vascular: No JVD.  Cardiovascular:     Rate and Rhythm: Normal rate and regular rhythm.     Heart sounds: No murmur heard. Pulmonary:     Effort: Pulmonary effort is normal. No respiratory distress.     Breath sounds: Normal breath sounds. No wheezing.  Abdominal:     General: Bowel sounds are normal. There is no distension.     Palpations: Abdomen is soft.     Tenderness: There is  no abdominal tenderness. There is no right CVA tenderness or left CVA tenderness.  Musculoskeletal:     Cervical back: No rigidity or tenderness.     Right lower leg: No edema.     Left lower leg: No edema.  Lymphadenopathy:     Cervical: No cervical adenopathy.  Skin:    General: Skin is warm and dry.  Neurological:     General: No focal deficit present.     Mental Status: She is alert. Mental status is at baseline.  Psychiatric:     Comments: flat     Labs reviewed: Basic Metabolic Panel: Recent Labs    07/14/21 0242 07/18/21 0000 01/08/22 1212 05/08/22 0000  NA 133* 135* 135 132*  K 3.6 4.4 4.5 4.5  CL 100 100 101 96*  CO2 23 23* 27 22  GLUCOSE 104*  --  109*  --   BUN 19 17 21 11   CREATININE 0.96 0.8 0.96 0.6  CALCIUM 9.0 9.4 9.9 9.3  TSH  --  2.15  --  1.52   Liver Function Tests: Recent Labs    06/18/21 0000 07/18/21 0000 05/08/22 0000  AST 15 16 24   ALT 8 9 13   ALKPHOS 70 68 88  ALBUMIN  --  4.3 3.9   No results for input(s): "LIPASE", "AMYLASE" in the last 8760 hours. No results for input(s): "AMMONIA" in the last 8760 hours. CBC: Recent Labs    07/14/21 0242 07/18/21 0000 01/08/22 1212 05/08/22 0000  WBC 6.0 6.9  7.8 6.9  NEUTROABS 3.6  --  4.8  --   HGB 13.6 14.3 14.1 14.0  HCT 39.7 42 42.2 41  MCV 95.7  --  97.5  --   PLT 276 325 279 404*   Lipid Panel: No results for input(s): "CHOL", "HDL", "LDLCALC", "TRIG", "CHOLHDL", "LDLDIRECT" in the last 8760 hours. Lab Results  Component Value Date   HGBA1C 5.9 (H) 01/23/2016    Procedures since last visit: No results found.  Assessment/Plan  1. Dementia without behavioral disturbance (HCC) Progressing over time but remains ambulatory and able to perform ADLs  2. Dysphoric mood Flat affect at times with some apathy Difficult to tease out symptoms of depression vs progression in dementia Will f/u in 4 months and did discuss care concerns with her son in epic messaging. If worsening could consider low dose antidepressant. Noted hx of low sodium.  3. PAF (paroxysmal atrial fibrillation) (HCC) Last EKG 01/2022 showed sinus brady with incomplete RBBB Continue atenolol Not on DOAC see cardiology notes  4. Senile osteoporosis Weight bearing exercise Vitamin D Given her age meds for this problem would likely not be beneficial    Labs/tests ordered:  * No order type specified * Next appt:  4 months with Dr Chales Abrahams    Total time :  time greater than 50% of total time spent doing pt counseling and coordination of care

## 2022-05-19 ENCOUNTER — Encounter: Payer: Self-pay | Admitting: Adult Health

## 2022-05-19 ENCOUNTER — Non-Acute Institutional Stay: Payer: Medicare Other | Admitting: Adult Health

## 2022-05-19 VITALS — BP 108/68 | HR 62 | Temp 97.6°F | Resp 16 | Ht 62.0 in | Wt 107.8 lb

## 2022-05-19 DIAGNOSIS — R4589 Other symptoms and signs involving emotional state: Secondary | ICD-10-CM

## 2022-05-19 DIAGNOSIS — M810 Age-related osteoporosis without current pathological fracture: Secondary | ICD-10-CM

## 2022-05-19 DIAGNOSIS — F0390 Unspecified dementia without behavioral disturbance: Secondary | ICD-10-CM | POA: Diagnosis not present

## 2022-05-19 DIAGNOSIS — I480 Paroxysmal atrial fibrillation: Secondary | ICD-10-CM

## 2022-06-10 DIAGNOSIS — E870 Hyperosmolality and hypernatremia: Secondary | ICD-10-CM | POA: Diagnosis not present

## 2022-06-10 DIAGNOSIS — E871 Hypo-osmolality and hyponatremia: Secondary | ICD-10-CM | POA: Diagnosis not present

## 2022-06-11 LAB — BASIC METABOLIC PANEL
BUN: 21 (ref 4–21)
CO2: 24 — AB (ref 13–22)
Chloride: 98 — AB (ref 99–108)
Creatinine: 0.7 (ref 0.5–1.1)
Glucose: 123
Potassium: 4.2 mEq/L (ref 3.5–5.1)
Sodium: 134 — AB (ref 137–147)

## 2022-06-11 LAB — COMPREHENSIVE METABOLIC PANEL WITH GFR
Calcium: 9 (ref 8.7–10.7)
eGFR: 75

## 2022-06-16 ENCOUNTER — Non-Acute Institutional Stay: Payer: Medicare Other | Admitting: Internal Medicine

## 2022-06-16 ENCOUNTER — Encounter: Payer: Self-pay | Admitting: Internal Medicine

## 2022-06-16 DIAGNOSIS — I480 Paroxysmal atrial fibrillation: Secondary | ICD-10-CM

## 2022-06-16 DIAGNOSIS — R0789 Other chest pain: Secondary | ICD-10-CM

## 2022-06-16 DIAGNOSIS — F0390 Unspecified dementia without behavioral disturbance: Secondary | ICD-10-CM

## 2022-06-16 NOTE — Progress Notes (Unsigned)
Location: [facility] Room Number: [phone] Place of Service:  ALF [phone])  Provider: Shea Evans  Code Status: DNR Goals of Care:     06/16/2022    4:23 PM  Advanced Directives  Does Patient Have a Medical Advance Directive? Yes  Type of Estate agent of [place];Out of facility DNR (pink MOST or yellow form)  Does patient want to make changes to medical advance directive? No - Patient declined  Copy of Healthcare Power of Attorney in Chart? Yes - validated most recent copy scanned in chart (See row information)     Chief Complaint  Patient presents with   Acute Visit    Patient is being seen for acute visit   Quality Metric Gaps    Discuss the need for AWV     HPI: Patient is a 89 y.o. female seen today for an acute visit for Atypical Chest pain Lives in [place] in [place]  Has h/o Hyponatremia, Palpitations/PAF, Osteoporosis, Hyperlipidemia, MCI   History of multiple right rib fractures  Recently had few episodes when she will c/o Chest pain Mid chest  Not associated with any shortness of breath diaphoresis. Usually has responded well to Tylenol. This time her pain was also associated with some nausea Today patient is not having any pain.  She does not remember any associated features with her pain. She was walking by herself with no assist and had no shortness of breath   Past Medical History:  Diagnosis Date   Acute upper respiratory infections of unspecified site 06/09/2011   Aphasia    Breast cancer (HCC) 12/11/2008   Encounter for general adult medical examination without abnormal findings    Encounter for immunization    Fatigue 01/2012   Hypercholesterolemia    on lipitor   Hypertension    Hyponatremia 01/2016   Impacted cerumen, bilateral    Memory loss    Mild cognitive impairment of uncertain or unknown etiology    Mitral valve prolapse    Nocturia    Other abnormal blood chemistry 01/21/2010    Palpitations 08/05/2000   Pure hypercholesterolemia, unspecified    Right bundle branch block    Sacrococcygeal disorders, not elsewhere classified    Scoliosis    Senile osteoporosis 09/28/2001   Unilateral primary osteoarthritis, left hip    Unspecified dementia, unspecified severity, without behavioral disturbance, psychotic disturbance, mood disturbance, and anxiety (HCC)    Unspecified hearing loss, bilateral    Unsteadiness on feet    Vascular dementia, unspecified severity, without behavioral disturbance, psychotic disturbance, mood disturbance, and anxiety (HCC)     Past Surgical History:  Procedure Laterality Date   BREAST LUMPECTOMY Right 12/27/2007   LAPAROSCOPIC LYSIS OF ADHESIONS  07/15/2006   Dr. Michaell Cowing   OTHER SURGICAL HISTORY     TONSILLECTOMY     TUBAL LIGATION  1963    No Known Allergies  Outpatient Encounter Medications as of 06/16/2022  Medication Sig   acetaminophen (TYLENOL) 325 MG tablet Take 650 mg by mouth as needed for mild pain.   AREXVY 120 MCG/0.5ML injection Inject 0.5 mLs into the muscle once.   atenolol (TENORMIN) 25 MG tablet Take 1/2 tab daily   Carboxymethylcellulose Sod PF 0.25 % SOLN Apply 1 drop to eye 3 (three) times daily as needed. 1 drop in both eyes TID PRN   Cholecalciferol (VITAMIN D3) 25 MCG (1000 UT) CAPS Take 1 capsule (1,000 Units total) by mouth daily.   Emollient (EUCERIN) lotion Apply  topically daily.   lidocaine 4 % Place 1 patch onto the skin daily as needed.   Olopatadine HCl 0.2 % SOLN Apply to eye.   omeprazole (PRILOSEC) 20 MG capsule Take 20 mg by mouth daily. (Patient not taking: Reported on 06/16/2022)   No facility-administered encounter medications on file as of 06/16/2022.    Review of Systems:  Review of Systems  Constitutional:  Negative for activity change and appetite change.  HENT: Negative.    Respiratory:  Negative for cough and shortness of breath.   Cardiovascular:  Positive for chest pain. Negative for  leg swelling.  Gastrointestinal:  Positive for nausea. Negative for constipation.  Genitourinary: Negative.   Musculoskeletal:  Negative for arthralgias, gait problem and myalgias.  Skin: Negative.   Neurological:  Negative for dizziness and weakness.  Psychiatric/Behavioral:  Positive for confusion. Negative for dysphoric mood and sleep disturbance.     Health Maintenance  Topic Date Due   Medicare Annual Wellness (AWV)  11/20/2021   COVID-19 Vaccine (6 - 2023-24 season) 12/25/2021   INFLUENZA VACCINE  08/07/2022   DTaP/Tdap/Td (2 - Td or Tdap) 05/14/2027   Pneumonia Vaccine 35+ Years old  Completed   DEXA SCAN  Completed   Zoster Vaccines- Shingrix  Completed   HPV VACCINES  Aged Out    Physical Exam: Vitals:   06/16/22 1615  BP: 129/79  Pulse: 69  Resp: 17  Temp: (!) 97.2 F (36.2 C)  TempSrc: Temporal  SpO2: 95%  Weight: 109 lb 12.8 oz (49.8 kg)  Height: 5\' 2"  (1.575 m)   Body mass index is 20.08 kg/m. Physical Exam Vitals reviewed.  Constitutional:      Appearance: Normal appearance.  HENT:     Head: Normocephalic.     Nose: Nose normal.     Mouth/Throat:     Mouth: Mucous membranes are moist.     Pharynx: Oropharynx is clear.  Eyes:     Pupils: Pupils are equal, round, and reactive to light.  Cardiovascular:     Rate and Rhythm: Normal rate and regular rhythm.     Pulses: Normal pulses.     Heart sounds: Normal heart sounds. No murmur heard. Pulmonary:     Effort: Pulmonary effort is normal.     Breath sounds: Normal breath sounds.  Abdominal:     General: Abdomen is flat. Bowel sounds are normal.     Palpations: Abdomen is soft.  Musculoskeletal:        General: No swelling.     Cervical back: Neck supple.  Skin:    General: Skin is warm.  Neurological:     General: No focal deficit present.     Mental Status: She is alert.  Psychiatric:        Mood and Affect: Mood normal.        Thought Content: Thought content normal.     Labs  reviewed: Basic Metabolic Panel: Recent Labs    07/14/21 0242 07/18/21 0000 01/08/22 1212 05/08/22 0000  NA 133* 135* 135 132*  K 3.6 4.4 4.5 4.5  CL 100 100 101 96*  CO2 23 23* 27 22  GLUCOSE 104*  --  109*  --   BUN 19 17 21 11   CREATININE 0.96 0.8 0.96 0.6  CALCIUM 9.0 9.4 9.9 9.3  TSH  --  2.15  --  1.52   Liver Function Tests: Recent Labs    06/18/21 0000 07/18/21 0000 05/08/22 0000  AST 15 16 24   ALT  8 9 13   ALKPHOS 70 68 88  ALBUMIN  --  4.3 3.9   No results for input(s): "LIPASE", "AMYLASE" in the last 8760 hours. No results for input(s): "AMMONIA" in the last 8760 hours. CBC: Recent Labs    07/14/21 0242 07/18/21 0000 01/08/22 1212 05/08/22 0000  WBC 6.0 6.9 7.8 6.9  NEUTROABS 3.6  --  4.8  --   HGB 13.6 14.3 14.1 14.0  HCT 39.7 42 42.2 41  MCV 95.7  --  97.5  --   PLT 276 325 279 404*   Lipid Panel: No results for input(s): "CHOL", "HDL", "LDLCALC", "TRIG", "CHOLHDL", "LDLDIRECT" in the last 8760 hours. Lab Results  Component Value Date   HGBA1C 5.9 (H) 01/23/2016    Procedures since last visit: No results found.  Assessment/Plan 1. Atypical chest pain/GERD Patient does not have any other associated symptoms besides nausea.  The pain is not related to her exertion .  And has responded well to Tylenol Will try some Prilosec 20 mg daily  2. Dementia without behavioral disturbance (HCC) Doing well in [place] 3 PAF No DOCA only on Tenormin    Labs/tests ordered:  * No order type specified * Next appt:  07/07/2022

## 2022-06-23 ENCOUNTER — Encounter: Payer: Medicare Other | Admitting: Adult Health

## 2022-06-29 ENCOUNTER — Telehealth: Payer: Self-pay | Admitting: Adult Health

## 2022-06-29 DIAGNOSIS — R079 Chest pain, unspecified: Secondary | ICD-10-CM | POA: Diagnosis not present

## 2022-06-29 NOTE — Telephone Encounter (Signed)
Nurse called to report Cynthia Watkins is having mid sternal chest pain without radiation. BP 181/74 HR 60-70s. No nausea, palpitations, or sob. This has been going on per the nurse off and on for a couple of weeks and tends to happen after a nap. She was seen by Dr Chales Abrahams on 6/10 and Prilosec prescribed. Symptoms thought to be esophageal. Order given to try nitroglycerin.  One tab given BP improved to 120/88 but chest pain persisted. 02 sat 90% nurse applied 02 sat now 96%. Order given to try mylanta and re eval patient. Also CXR stat ordered. If symptoms persist will need to consider ER eval although given her age, DNR status, and memory loss careful consideration is warranted with shared decision making with her family.

## 2022-06-30 ENCOUNTER — Non-Acute Institutional Stay: Payer: Medicare Other | Admitting: Internal Medicine

## 2022-06-30 ENCOUNTER — Encounter: Payer: Self-pay | Admitting: Internal Medicine

## 2022-06-30 ENCOUNTER — Telehealth: Payer: Self-pay

## 2022-06-30 DIAGNOSIS — F0390 Unspecified dementia without behavioral disturbance: Secondary | ICD-10-CM | POA: Diagnosis not present

## 2022-06-30 DIAGNOSIS — R0789 Other chest pain: Secondary | ICD-10-CM | POA: Diagnosis not present

## 2022-06-30 DIAGNOSIS — K219 Gastro-esophageal reflux disease without esophagitis: Secondary | ICD-10-CM

## 2022-06-30 NOTE — Telephone Encounter (Signed)
Patient's son Ramon Dredge) called to speak with Dr. Chales Abrahams about his mother's care. He stated that they have spoke once before and he was needed to ask some questions. He reports calling the nurse in [place] at [facility] and no answer or haven't received a call back. Ramon Dredge report he can be reached at [phone].

## 2022-06-30 NOTE — Progress Notes (Unsigned)
Location:  [facility] Room Number: NO/539/A Place of Service:  ALF [phone]) Provider:  Einar Crow , MD  Patient Care Team: Fletcher Anon, NP as PCP - General (Nurse Practitioner) [place], Peter M, MD as PCP - Cardiology (Cardiology) Melvenia Needles, MD as Consulting Physician (Ophthalmology) [place], Peter M, MD as Consulting Physician (Cardiology) Nita Sells, MD as Consulting Physician (Dermatology) Karie Soda, MD as Consulting Physician (General Surgery) [facility] Forester, MD as Consulting Physician (Hematology and Oncology) Magrinat, Valentino Hue, MD (Inactive) as Consulting Physician (Oncology) Maris Berger, MD as Consulting Physician (Ophthalmology)  Extended Emergency Contact Information Primary Emergency Contact: Adan Sis of [place] Home Phone: [phone] Work Phone: [phone] Mobile Phone: [phone] Relation: Son Secondary Emergency Contact: Arther Abbott of [place] Home Phone: [phone] Mobile Phone: [phone] Relation: Son  Code Status:  DNR Goals of care: Advanced Directive information    06/30/2022    2:17 PM  Advanced Directives  Does Patient Have a Medical Advance Directive? Yes  Type of Advance Directive Out of facility DNR (pink MOST or yellow form)  Does patient want to make changes to medical advance directive? No - Patient declined  Copy of Healthcare Power of Attorney in Chart? No - copy requested  Pre-existing out of facility DNR order (yellow form or pink MOST form) Yellow form placed in chart (order not valid for inpatient use)     Chief Complaint  Patient presents with   Acute Visit    Patient is being seen for chest pain    HPI:  Pt is a 89 y.o. female seen today for an acute visit for    Past Medical History:  Diagnosis Date   Acute upper respiratory infections of unspecified site 06/09/2011   Aphasia    Breast cancer (HCC)  12/11/2008   Encounter for general adult medical examination without abnormal findings    Encounter for immunization    Fatigue 01/2012   Hypercholesterolemia    on lipitor   Hypertension    Hyponatremia 01/2016   Impacted cerumen, bilateral    Memory loss    Mild cognitive impairment of uncertain or unknown etiology    Mitral valve prolapse    Nocturia    Other abnormal blood chemistry 01/21/2010   Palpitations 08/05/2000   Pure hypercholesterolemia, unspecified    Right bundle branch block    Sacrococcygeal disorders, not elsewhere classified    Scoliosis    Senile osteoporosis 09/28/2001   Unilateral primary osteoarthritis, left hip    Unspecified dementia, unspecified severity, without behavioral disturbance, psychotic disturbance, mood disturbance, and anxiety (HCC)    Unspecified hearing loss, bilateral    Unsteadiness on feet    Vascular dementia, unspecified severity, without behavioral disturbance, psychotic disturbance, mood disturbance, and anxiety (HCC)    Past Surgical History:  Procedure Laterality Date   BREAST LUMPECTOMY Right 12/27/2007   LAPAROSCOPIC LYSIS OF ADHESIONS  07/15/2006   Dr. Michaell Cowing   OTHER SURGICAL HISTORY     TONSILLECTOMY     TUBAL LIGATION  1963    No Known Allergies  Outpatient Encounter Medications as of 06/30/2022  Medication Sig   acetaminophen (TYLENOL) 325 MG tablet Take 650 mg by mouth as needed for mild pain.   aluminum-magnesium hydroxide-simethicone (MAALOX) 200-200-20 MG/5ML SUSP Take 30 mLs by mouth 4 (four) times daily -  before meals and at bedtime.   atenolol (TENORMIN) 25 MG tablet Take 1/2 tab daily   Carboxymethylcellulose Sod PF 0.25 %  SOLN Apply 1 drop to eye 3 (three) times daily as needed. 1 drop in both eyes TID PRN   Cholecalciferol (VITAMIN D3) 25 MCG (1000 UT) CAPS Take 1 capsule (1,000 Units total) by mouth daily.   Emollient (EUCERIN) lotion Apply topically daily.   lidocaine 4 % Place 1 patch onto the skin daily  as needed.   Olopatadine HCl 0.2 % SOLN Apply to eye.   omeprazole (PRILOSEC) 20 MG capsule Take 20 mg by mouth daily.   [DISCONTINUED] AREXVY 120 MCG/0.5ML injection Inject 0.5 mLs into the muscle once.   No facility-administered encounter medications on file as of 06/30/2022.    Review of Systems  Immunization History  Administered Date(s) Administered   COVID-19, mRNA, vaccine(Comirnaty)12 years and older 10/30/2021   Fluad Quad(high Dose 65+) 10/08/2021   Influenza Split 10/23/2011   Influenza, High Dose Seasonal PF 09/20/2015, 10/09/2016, 10/15/2018   Influenza,inj,Quad PF,6+ Mos 10/28/2013, 10/30/2017   Influenza-Unspecified 10/06/2012, 10/26/2014   Moderna Sars-Covid-2 Vaccination 01/16/2019, 02/15/2019, 11/22/2019, 10/19/2020   Pneumococcal Conjugate-13 02/14/2014   Pneumococcal Polysaccharide-23 01/06/1997   Tdap 05/13/2017   Tetanus 05/13/2017   Varicella 01/14/2018   Zoster Recombinat (Shingrix) 10/09/2016, 09/17/2017, 01/14/2018   Zoster, Live 01/07/1999   Pertinent  Health Maintenance Due  Topic Date Due   INFLUENZA VACCINE  08/07/2022   DEXA SCAN  Completed      01/08/2022   12:07 PM 02/18/2022    9:00 AM 04/14/2022    9:30 AM 05/19/2022    2:57 PM 06/30/2022    2:19 PM  Fall Risk  Falls in the past year?  1 1 1 1   Was there an injury with Fall?  1 1 1 1   Fall Risk Category Calculator  2 2 2 2   (RETIRED) Patient Fall Risk Level High fall risk      Patient at Risk for Falls Due to  History of fall(s) History of fall(s) History of fall(s) History of fall(s)  Fall risk Follow up  Falls evaluation completed Falls evaluation completed Falls evaluation completed Falls evaluation completed   Functional Status Survey:    Vitals:   06/30/22 1414  BP: 131/68  Pulse: 66  Resp: 20  Temp: (!) 97.3 F (36.3 C)  SpO2: 95%  Weight: 109 lb 12.8 oz (49.8 kg)  Height: 5\' 2"  (1.575 m)   Body mass index is 20.08 kg/m. Physical Exam  Labs reviewed: Recent Labs     07/14/21 0242 07/18/21 0000 01/08/22 1212 05/08/22 0000 06/11/22 0000  NA 133*   < > 135 132* 134*  K 3.6   < > 4.5 4.5 4.2  CL 100   < > 101 96* 98*  CO2 23   < > 27 22 24*  GLUCOSE 104*  --  109*  --   --   BUN 19   < > 21 11 21   CREATININE 0.96   < > 0.96 0.6 0.7  CALCIUM 9.0   < > 9.9 9.3 9.0   < > = values in this interval not displayed.   Recent Labs    07/18/21 0000 05/08/22 0000  AST 16 24  ALT 9 13  ALKPHOS 68 88  ALBUMIN 4.3 3.9   Recent Labs    07/14/21 0242 07/18/21 0000 01/08/22 1212 05/08/22 0000  WBC 6.0 6.9 7.8 6.9  NEUTROABS 3.6  --  4.8  --   HGB 13.6 14.3 14.1 14.0  HCT 39.7 42 42.2 41  MCV 95.7  --  97.5  --  PLT 276 325 279 404*   Lab Results  Component Value Date   TSH 1.52 05/08/2022   Lab Results  Component Value Date   HGBA1C 5.9 (H) 01/23/2016   Lab Results  Component Value Date   CHOL 369 (A) 08/28/2020   HDL 51 08/28/2020   LDLCALC 284 08/28/2020   TRIG 170 (A) 08/28/2020   CHOLHDL 3.5 01/27/2016    Significant Diagnostic Results in last 30 days:  No results found.  Assessment/Plan 1. Atypical chest pain ***    Family/ staff Communication: ***  Labs/tests ordered:  ***

## 2022-06-30 NOTE — Progress Notes (Unsigned)
Location: [facility] Room Number: NO/539/A Place of Service:  ALF (13)  Provider:   Code Status: DNR Goals of Care:     06/30/2022    2:17 PM  Advanced Directives  Does Patient Have a Medical Advance Directive? Yes  Type of Advance Directive Out of facility DNR (pink MOST or yellow form)  Does patient want to make changes to medical advance directive? No - Patient declined  Copy of Healthcare Power of Attorney in Chart? No - copy requested  Pre-existing out of facility DNR order (yellow form or pink MOST form) Yellow form placed in chart (order not valid for inpatient use)     Chief Complaint  Patient presents with   Acute Visit    Patient is being seen for chest pain    HPI: Patient is a 89 y.o. female seen today for an acute visit for Chest pain again 2 days ago Lives in [place] in [place]   Has h/o Hyponatremia, Palpitations/PAF, Osteoporosis, Hyperlipidemia, MCI   History of multiple right rib fractures  Has been having Atypical Chest pain Usually happens in the evening Started on Prilosec 2 weeks ago Patient had another episode of midsternal pain.  It happened at 10 Pm  Her  blood pressure was elevated this time. She got S/L  nitroglycerin which helped her blood pressure but did not help her pain.  Then she got dose of Mylanta which did help Chest Xray was negative  She today said she had mild pain in her midsternal area Was tender on touch No SOB,Cough or Diaphoresis Past Medical History:  Diagnosis Date   Acute upper respiratory infections of unspecified site 06/09/2011   Aphasia    Breast cancer (HCC) 12/11/2008   Encounter for general adult medical examination without abnormal findings    Encounter for immunization    Fatigue 01/2012   Hypercholesterolemia    on lipitor   Hypertension    Hyponatremia 01/2016   Impacted cerumen, bilateral    Memory loss    Mild cognitive impairment of uncertain or unknown etiology    Mitral  valve prolapse    Nocturia    Other abnormal blood chemistry 01/21/2010   Palpitations 08/05/2000   Pure hypercholesterolemia, unspecified    Right bundle branch block    Sacrococcygeal disorders, not elsewhere classified    Scoliosis    Senile osteoporosis 09/28/2001   Unilateral primary osteoarthritis, left hip    Unspecified dementia, unspecified severity, without behavioral disturbance, psychotic disturbance, mood disturbance, and anxiety (HCC)    Unspecified hearing loss, bilateral    Unsteadiness on feet    Vascular dementia, unspecified severity, without behavioral disturbance, psychotic disturbance, mood disturbance, and anxiety (HCC)     Past Surgical History:  Procedure Laterality Date   BREAST LUMPECTOMY Right 12/27/2007   LAPAROSCOPIC LYSIS OF ADHESIONS  07/15/2006   Dr. Michaell Cowing   OTHER SURGICAL HISTORY     TONSILLECTOMY     TUBAL LIGATION  1963    No Known Allergies  Outpatient Encounter Medications as of 06/30/2022  Medication Sig   acetaminophen (TYLENOL) 325 MG tablet Take 650 mg by mouth as needed for mild pain.   aluminum-magnesium hydroxide-simethicone (MAALOX) 200-200-20 MG/5ML SUSP Take 30 mLs by mouth 4 (four) times daily -  before meals and at bedtime.   atenolol (TENORMIN) 25 MG tablet Take 1/2 tab daily   Carboxymethylcellulose Sod PF 0.25 % SOLN Apply 1 drop to eye 3 (three) times daily as needed. 1  drop in both eyes TID PRN   Cholecalciferol (VITAMIN D3) 25 MCG (1000 UT) CAPS Take 1 capsule (1,000 Units total) by mouth daily.   Emollient (EUCERIN) lotion Apply topically daily.   lidocaine 4 % Place 1 patch onto the skin daily as needed.   Olopatadine HCl 0.2 % SOLN Apply to eye.   omeprazole (PRILOSEC) 20 MG capsule Take 20 mg by mouth daily.   [DISCONTINUED] AREXVY 120 MCG/0.5ML injection Inject 0.5 mLs into the muscle once.   No facility-administered encounter medications on file as of 06/30/2022.    Review of Systems:  Review of Systems  Unable to  perform ROS: Dementia    Health Maintenance  Topic Date Due   Medicare Annual Wellness (AWV)  11/20/2021   COVID-19 Vaccine (6 - 2023-24 season) 08/16/2022 (Originally 12/25/2021)   INFLUENZA VACCINE  08/07/2022   DTaP/Tdap/Td (2 - Td or Tdap) 05/14/2027   Pneumonia Vaccine 51+ Years old  Completed   DEXA SCAN  Completed   Zoster Vaccines- Shingrix  Completed   HPV VACCINES  Aged Out    Physical Exam: Vitals:   06/30/22 1414  BP: 131/68  Pulse: 66  Resp: 20  Temp: (!) 97.3 F (36.3 C)  SpO2: 95%  Weight: 109 lb 12.8 oz (49.8 kg)  Height: 5\' 2"  (1.575 m)   Body mass index is 20.08 kg/m. Physical Exam Vitals reviewed.  Constitutional:      Appearance: Normal appearance.  HENT:     Head: Normocephalic.     Nose: Nose normal.     Mouth/Throat:     Mouth: Mucous membranes are moist.     Pharynx: Oropharynx is clear.  Eyes:     Pupils: Pupils are equal, round, and reactive to light.  Cardiovascular:     Rate and Rhythm: Normal rate and regular rhythm.     Pulses: Normal pulses.     Heart sounds: Murmur heard.  Pulmonary:     Effort: Pulmonary effort is normal.     Breath sounds: Normal breath sounds.     Comments: Few rales in Right lower lung Abdominal:     General: Abdomen is flat. Bowel sounds are normal.     Palpations: Abdomen is soft.  Musculoskeletal:        General: No swelling.     Cervical back: Neck supple.  Skin:    General: Skin is warm.  Neurological:     General: No focal deficit present.     Mental Status: She is alert.  Psychiatric:        Mood and Affect: Mood normal.        Thought Content: Thought content normal.    Labs reviewed: Basic Metabolic Panel: Recent Labs    07/14/21 0242 07/18/21 0000 01/08/22 1212 05/08/22 0000 06/11/22 0000  NA 133* 135* 135 132* 134*  K 3.6 4.4 4.5 4.5 4.2  CL 100 100 101 96* 98*  CO2 23 23* 27 22 24*  GLUCOSE 104*  --  109*  --   --   BUN 19 17 21 11 21   CREATININE 0.96 0.8 0.96 0.6 0.7   CALCIUM 9.0 9.4 9.9 9.3 9.0  TSH  --  2.15  --  1.52  --    Liver Function Tests: Recent Labs    07/18/21 0000 05/08/22 0000  AST 16 24  ALT 9 13  ALKPHOS 68 88  ALBUMIN 4.3 3.9   No results for input(s): "LIPASE", "AMYLASE" in the last 8760 hours. No  results for input(s): "AMMONIA" in the last 8760 hours. CBC: Recent Labs    07/14/21 0242 07/18/21 0000 01/08/22 1212 05/08/22 0000  WBC 6.0 6.9 7.8 6.9  NEUTROABS 3.6  --  4.8  --   HGB 13.6 14.3 14.1 14.0  HCT 39.7 42 42.2 41  MCV 95.7  --  97.5  --   PLT 276 325 279 404*   Lipid Panel: No results for input(s): "CHOL", "HDL", "LDLCALC", "TRIG", "CHOLHDL", "LDLDIRECT" in the last 8760 hours. Lab Results  Component Value Date   HGBA1C 5.9 (H) 01/23/2016    Procedures since last visit: No results found.  Assessment/Plan 1. Atypical chest pain Differential incudes Anxiety , Cardiac, Reflux or Musculaskletal Try Tylenol Discussed in detail with Dr. Annabell Howells  We agreed that she does not need extensive workup due to her age and dementia.  We will try Tylenol and increase her Prilosec to twice daily to see if this helps her pain.  2. Gastroesophageal reflux disease, unspecified whether esophagitis present Change Prilosec to 20 mg BID  3. Dementia without behavioral disturbance (HCC) Continues to do well in [place]    Labs/tests ordered:  * No order type specified * Next appt:  07/07/2022

## 2022-07-07 ENCOUNTER — Non-Acute Institutional Stay (INDEPENDENT_AMBULATORY_CARE_PROVIDER_SITE_OTHER): Payer: Medicare Other | Admitting: Adult Health

## 2022-07-07 ENCOUNTER — Encounter: Payer: Self-pay | Admitting: Adult Health

## 2022-07-07 VITALS — BP 124/68 | HR 62 | Temp 97.8°F | Resp 16 | Ht 62.0 in | Wt 114.2 lb

## 2022-07-07 DIAGNOSIS — Z0000 Encounter for general adult medical examination without abnormal findings: Secondary | ICD-10-CM

## 2022-07-07 NOTE — Progress Notes (Signed)
Subjective:   Cynthia Watkins is a 89 y.o. female who presents for Medicare Annual (Subsequent) preventive examination at [facility] setting. .  Visit Complete: In person  Patient Medicare AWV questionnaire was completed by the patient on 07/07/22; I have confirmed that all information answered by patient is correct and no changes since this date.  Review of Systems           Objective:    Today's Vitals   07/07/22 1351  BP: 124/68  Pulse: 62  Resp: 16  Temp: 97.8 F (36.6 C)  TempSrc: Temporal  SpO2: 96%  Weight: 114 lb 3.2 oz (51.8 kg)  Height: 5\' 2"  (1.575 m)   Body mass index is 20.89 kg/m.     07/07/2022    1:54 PM 06/30/2022    2:17 PM 06/16/2022    4:23 PM 05/19/2022    2:57 PM 05/05/2022    9:32 AM 04/14/2022    9:30 AM 02/18/2022    9:01 AM  Advanced Directives  Does Patient Have a Medical Advance Directive? Yes Yes Yes Yes Yes Yes Yes  Type of Advance Directive Out of facility DNR (pink MOST or yellow form) Out of facility DNR (pink MOST or yellow form) Healthcare Power of [place];Out of facility DNR (pink MOST or yellow form) Healthcare Power of [place];Out of facility DNR (pink MOST or yellow form) Healthcare Power of [place];Out of facility DNR (pink MOST or yellow form) Healthcare Power of [place];Out of facility DNR (pink MOST or yellow form) Healthcare Power of [place];Out of facility DNR (pink MOST or yellow form)  Does patient want to make changes to medical advance directive? No - Patient declined No - Patient declined No - Patient declined No - Patient declined No - Patient declined No - Patient declined No - Patient declined  Copy of Healthcare Power of Attorney in Chart? No - copy requested No - copy requested Yes - validated most recent copy scanned in chart (See row information) Yes - validated most recent copy scanned in chart (See row information) Yes - validated most recent copy scanned in chart (See row information) Yes -  validated most recent copy scanned in chart (See row information) Yes - validated most recent copy scanned in chart (See row information)  Pre-existing out of facility DNR order (yellow form or pink MOST form) Yellow form placed in chart (order not valid for inpatient use) Yellow form placed in chart (order not valid for inpatient use)         Current Medications (verified) Outpatient Encounter Medications as of 07/07/2022  Medication Sig   acetaminophen (TYLENOL) 325 MG tablet Take 650 mg by mouth as needed for mild pain.   atenolol (TENORMIN) 25 MG tablet Take 1/2 tab daily   Carboxymethylcellulose Sod PF 0.25 % SOLN Apply 1 drop to eye 3 (three) times daily as needed. 1 drop in both eyes TID PRN   Cholecalciferol (VITAMIN D3) 25 MCG (1000 UT) CAPS Take 1 capsule (1,000 Units total) by mouth daily.   Emollient (EUCERIN) lotion Apply topically daily.   lidocaine 4 % Place 1 patch onto the skin daily as needed.   Olopatadine HCl 0.2 % SOLN Apply to eye.   omeprazole (PRILOSEC) 20 MG capsule Take 20 mg by mouth daily.   [DISCONTINUED] aluminum-magnesium hydroxide-simethicone (MAALOX) 200-200-20 MG/5ML SUSP Take 30 mLs by mouth 4 (four) times daily -  before meals and at bedtime.   No facility-administered encounter medications on file as  of 07/07/2022.    Allergies (verified) Patient has no known allergies.   History: Past Medical History:  Diagnosis Date   Acute upper respiratory infections of unspecified site 06/09/2011   Aphasia    Breast cancer (HCC) 12/11/2008   Encounter for general adult medical examination without abnormal findings    Encounter for immunization    Fatigue 01/2012   Hypercholesterolemia    on lipitor   Hypertension    Hyponatremia 01/2016   Impacted cerumen, bilateral    Memory loss    Mild cognitive impairment of uncertain or unknown etiology    Mitral valve prolapse    Nocturia    Other abnormal blood chemistry 01/21/2010   Palpitations 08/05/2000    Pure hypercholesterolemia, unspecified    Right bundle branch block    Sacrococcygeal disorders, not elsewhere classified    Scoliosis    Senile osteoporosis 09/28/2001   Unilateral primary osteoarthritis, left hip    Unspecified dementia, unspecified severity, without behavioral disturbance, psychotic disturbance, mood disturbance, and anxiety (HCC)    Unspecified hearing loss, bilateral    Unsteadiness on feet    Vascular dementia, unspecified severity, without behavioral disturbance, psychotic disturbance, mood disturbance, and anxiety (HCC)    Past Surgical History:  Procedure Laterality Date   BREAST LUMPECTOMY Right 12/27/2007   LAPAROSCOPIC LYSIS OF ADHESIONS  07/15/2006   Dr. Michaell Cowing   OTHER SURGICAL HISTORY     TONSILLECTOMY     TUBAL LIGATION  1963   Family History  Problem Relation Age of Onset   Stroke Mother    Cancer Father        colon   Alzheimer's disease Sister    Social History   Socioeconomic History   Marital status: Widowed    Spouse name: Not on file   Number of children: 3   Years of education: Not on file   Highest education level: Not on file  Occupational History   Occupation: Retired Magazine features editor: RETIRED  Tobacco Use   Smoking status: Former    Packs/day: 0.25    Years: 10.00    Additional pack years: 0.00    Total pack years: 2.50    Types: Cigarettes    Quit date: 08/06/1954    Years since quitting: 67.9   Smokeless tobacco: Never  Vaping Use   Vaping Use: Never used  Substance and Sexual Activity   Alcohol use: Not Currently    Comment: Occasionally    Drug use: No   Sexual activity: Never  Other Topics Concern   Not on file  Social History Narrative   Lives at [facility] since 05/1998   Widowed   Living will   Former smoker, stopped 1956   Alcohol  Rare   Exercise - walk one hour daily 7 days a week,  3 days of stretching and strengthen, yoga 45 minutes 2 days a week   Social Determinants of Health   Financial  Resource Strain: Low Risk  (09/15/2017)   Overall Financial Resource Strain (CARDIA)    Difficulty of Paying Living Expenses: Not hard at all  Food Insecurity: No Food Insecurity (09/15/2017)   Hunger Vital Sign    Worried About Running Out of Food in the Last Year: Never true    Ran Out of Food in the Last Year: Never true  Transportation Needs: No Transportation Needs (09/15/2017)   PRAPARE - Administrator, Civil Service (Medical): No    Lack of Transportation (Non-Medical): No  Physical Activity: Sufficiently Active (09/15/2017)   Exercise Vital Sign    Days of Exercise per Week: 7 days    Minutes of Exercise per Session: 120 min  Stress: No Stress Concern Present (09/15/2017)   [employer] of Occupational Health - Occupational Stress Questionnaire    Feeling of Stress : Not at all  Social Connections: Moderately Isolated (09/15/2017)   Social Connection and Isolation Panel [NHANES]    Frequency of Communication with Friends and Family: More than three times a week    Frequency of Social Gatherings with Friends and Family: More than three times a week    Attends Religious Services: Never    Database administrator or Organizations: No    Attends Banker Meetings: Never    Marital Status: Widowed    Tobacco Counseling Counseling given: Not Answered   Clinical Intake:  Pre-visit preparation completed: No  Pain : No/denies pain     BMI - recorded: 20.8 Nutritional Status: BMI of 19-24  Normal Nutritional Risks: None Diabetes: No  How often do you need to have someone help you when you read instructions, pamphlets, or other written materials from your doctor or pharmacy?: 3 - Sometimes What is the last grade level you completed in school?: PHD history  Interpreter Needed?: No  Information entered by :: Fletcher Anon NP   Activities of Daily Living    07/07/2022    1:54 PM  In your present state of health, do you have any difficulty  performing the following activities:  Hearing? 1  Vision? 0  Difficulty concentrating or making decisions? 1  Walking or climbing stairs? 0  Dressing or bathing? 0  Doing errands, shopping? 1    Patient Care Team: Fletcher Anon, NP as PCP - General (Nurse Practitioner) [place], Peter M, MD as PCP - Cardiology (Cardiology) Melvenia Needles, MD as Consulting Physician (Ophthalmology) [place], Peter M, MD as Consulting Physician (Cardiology) Nita Sells, MD as Consulting Physician (Dermatology) Karie Soda, MD as Consulting Physician (General Surgery) [facility], [facility] Forester, MD as Consulting Physician (Hematology and Oncology) Magrinat, Valentino Hue, MD (Inactive) as Consulting Physician (Oncology) Maris Berger, MD as Consulting Physician (Ophthalmology)  Indicate any recent Medical Services you may have received from other than [facility] providers in the past year (date may be approximate).     Assessment:   This is a routine wellness examination for Cynthia Watkins.  Hearing/Vision screen No results found.  Dietary issues and exercise activities discussed:     Goals Addressed   None    Depression Screen    07/07/2022    1:53 PM 02/18/2022    9:00 AM 06/21/2021    4:09 PM 11/20/2020   11:32 AM 01/25/2020   11:34 AM 11/15/2019    9:11 AM 10/26/2019   11:30 AM  PHQ 2/9 Scores  PHQ - 2 Score 0 0 0 0 0 0 0    Fall Risk    07/07/2022    1:53 PM 06/30/2022    2:19 PM 05/19/2022    2:57 PM 04/14/2022    9:30 AM 02/18/2022    9:00 AM  Fall Risk   Falls in the past year? 1 1 1 1 1   Number falls in past yr: 0 0 0 0 0  Injury with Fall? 1 1 1 1 1   Risk for fall due to : History of fall(s) History of fall(s) History of fall(s) History of fall(s) History of fall(s)  Follow up Falls evaluation completed  Falls evaluation completed Falls evaluation completed Falls evaluation completed Falls evaluation completed    MEDICARE RISK AT HOME:   TIMED UP AND GO:  Was the  test performed?  Yes  Length of time to ambulate 10 feet: 12 sec Gait steady and fast without use of assistive device    Cognitive Function:    07/07/2022    2:00 PM 02/23/2019   10:15 AM 09/15/2017    2:14 PM 08/06/2016    3:33 PM 08/15/2015    2:52 PM  MMSE - Mini Mental State Exam  Orientation to time 3 4 4 5 4   Orientation to Place 5 4 5 5 5   Registration 3 3 3 3 3   Attention/ Calculation 5 5 5 5 5   Recall 2 3 3 3 3   Language- name 2 objects 2 2 2 2 2   Language- repeat 1 1 1 1 1   Language- follow 3 step command 3 3 3 3 3   Language- read & follow direction 0 1 1 1 1   Write a sentence 1 1 1 1 1   Copy design 0 1 1 1 1   Total score 25 28 29 30 29         11/20/2020   11:35 AM 11/15/2019    9:14 AM 11/11/2018    9:19 AM  6CIT Screen  What Year? 0 points 0 points 0 points  What month? 0 points 0 points 0 points  What time? 0 points 0 points 0 points  Count back from 20 0 points 0 points 0 points  Months in reverse 2 points 0 points 0 points  Repeat phrase 2 points 2 points 0 points  Total Score 4 points 2 points 0 points    Immunizations Immunization History  Administered Date(s) Administered   COVID-19, mRNA, vaccine(Comirnaty)12 years and older 10/30/2021   Fluad Quad(high Dose 65+) 10/08/2021   Influenza Split 10/23/2011   Influenza, High Dose Seasonal PF 09/20/2015, 10/09/2016, 10/15/2018   Influenza,inj,Quad PF,6+ Mos 10/28/2013, 10/30/2017   Influenza-Unspecified 10/06/2012, 10/26/2014   Moderna Sars-Covid-2 Vaccination 01/16/2019, 02/15/2019, 11/22/2019, 10/19/2020   Pneumococcal Conjugate-13 02/14/2014   Pneumococcal Polysaccharide-23 01/06/1997   Tdap 05/13/2017   Tetanus 05/13/2017   Varicella 01/14/2018   Zoster Recombinant(Shingrix) 10/09/2016, 09/17/2017, 01/14/2018   Zoster, Live 01/07/1999    TDAP status: Up to date  Flu Vaccine status: Up to date  Pneumococcal vaccine status: Up to date  Covid-19 vaccine status: Completed vaccines  Qualifies for  Shingles Vaccine? Yes   Zostavax completed Yes   Shingrix Completed?: Yes  Screening Tests Health Maintenance  Topic Date Due   Medicare Annual Wellness (AWV)  11/20/2021   COVID-19 Vaccine (6 - 2023-24 season) 08/16/2022 (Originally 12/25/2021)   INFLUENZA VACCINE  08/07/2022   DTaP/Tdap/Td (2 - Td or Tdap) 05/14/2027   Pneumonia Vaccine 52+ Years old  Completed   DEXA SCAN  Completed   Zoster Vaccines- Shingrix  Completed   HPV VACCINES  Aged Out    Health Maintenance  Health Maintenance Due  Topic Date Due   Medicare Annual Wellness (AWV)  11/20/2021    Colorectal cancer screening: No longer required.   Mammogram status: No longer required due to age.  Bone Density status: Ordered na due to age and goals of care. Marland Kitchen Pt provided with contact info and advised to call to schedule appt.  Lung Cancer Screening: (Low Dose CT Chest recommended if Age 45-80 years, 20 pack-year currently smoking OR have quit w/in 15years.) does not qualify.  Lung Cancer Screening Referral: na  Additional Screening:  Hepatitis C Screening: does not qualify; Completed na  Vision Screening: Recommended annual ophthalmology exams for early detection of glaucoma and other disorders of the eye. Is the patient up to date with their annual eye exam?  No  Who is the provider or what is the name of the office in which the patient attends annual eye exams? na If pt is not established with a provider, would they like to be referred to a provider to establish care? No .   Dental Screening: Recommended annual dental exams for proper oral hygiene  Diabetic Foot Exam: Diabetic Foot Exam: Completed na  Community Resource Referral / Chronic Care Management: CRR required this visit?  No   CCM required this visit?  No     Plan:     I have personally reviewed and noted the following in the patient's chart:   Medical and social history Use of alcohol, tobacco or illicit drugs  Current medications and  supplements including opioid prescriptions. Patient is not currently taking opioid prescriptions. Functional ability and status Nutritional status Physical activity Advanced directives List of other physicians Hospitalizations, surgeries, and ER visits in previous 12 months Vitals Screenings to include cognitive, depression, and falls Referrals and appointments  In addition, I have reviewed and discussed with patient certain preventive protocols, quality metrics, and best practice recommendations. A written personalized care plan for preventive services as well as general preventive health recommendations were provided to patient.     Fletcher Anon, NP   07/07/2022   After Visit Summary: Given to patient and sent to [facility].  Nurse Notes: na

## 2022-07-07 NOTE — Patient Instructions (Signed)
Cynthia Watkins , Thank you for taking time to come for your Medicare Wellness Visit. I appreciate your ongoing commitment to your health goals. Please review the following plan we discussed and let me know if I can assist you in the future.   Screening recommendations/referrals: Colonoscopy aged out Mammogram aged out Bone Density not indicated due to goals of care.  Recommended yearly ophthalmology/optometry visit for glaucoma screening and checkup Recommended yearly dental visit for hygiene and checkup  Vaccinations: Influenza vaccine- due annually in September/October Pneumococcal vaccine up to date Tdap vaccine up to date Shingles vaccine up to date    Advanced directives: reviewed  Conditions/risks identified: fall risk  Next appointment: 1 year   Preventive Care 87 Years and Older, Female Preventive care refers to lifestyle choices and visits with your health care provider that can promote health and wellness. What does preventive care include? A yearly physical exam. This is also called an annual well check. Dental exams once or twice a year. Routine eye exams. Ask your health care provider how often you should have your eyes checked. Personal lifestyle choices, including: Daily care of your teeth and gums. Regular physical activity. Eating a healthy diet. Avoiding tobacco and drug use. Limiting alcohol use. Practicing safe sex. Taking low-dose aspirin every day. Taking vitamin and mineral supplements as recommended by your health care provider. What happens during an annual well check? The services and screenings done by your health care provider during your annual well check will depend on your age, overall health, lifestyle risk factors, and family history of disease. Counseling  Your health care provider may ask you questions about your: Alcohol use. Tobacco use. Drug use. Emotional well-being. Home and relationship well-being. Sexual activity. Eating  habits. History of falls. Memory and ability to understand (cognition). Work and work Astronomer. Reproductive health. Screening  You may have the following tests or measurements: Height, weight, and BMI. Blood pressure. Lipid and cholesterol levels. These may be checked every 5 years, or more frequently if you are over 64 years old. Skin check. Lung cancer screening. You may have this screening every year starting at age 87 if you have a 30-pack-year history of smoking and currently smoke or have quit within the past 15 years. Fecal occult blood test (FOBT) of the stool. You may have this test every year starting at age 16. Flexible sigmoidoscopy or colonoscopy. You may have a sigmoidoscopy every 5 years or a colonoscopy every 10 years starting at age 87. Hepatitis C blood test. Hepatitis B blood test. Sexually transmitted disease (STD) testing. Diabetes screening. This is done by checking your blood sugar (glucose) after you have not eaten for a while (fasting). You may have this done every 1-3 years. Bone density scan. This is done to screen for osteoporosis. You may have this done starting at age 87. Mammogram. This may be done every 1-2 years. Talk to your health care provider about how often you should have regular mammograms. Talk with your health care provider about your test results, treatment options, and if necessary, the need for more tests. Vaccines  Your health care provider may recommend certain vaccines, such as: Influenza vaccine. This is recommended every year. Tetanus, diphtheria, and acellular pertussis (Tdap, Td) vaccine. You may need a Td booster every 10 years. Zoster vaccine. You may need this after age 43. Pneumococcal 13-valent conjugate (PCV13) vaccine. One dose is recommended after age 25. Pneumococcal polysaccharide (PPSV23) vaccine. One dose is recommended after age 53. Talk to your  health care provider about which screenings and vaccines you need and how  often you need them. This information is not intended to replace advice given to you by your health care provider. Make sure you discuss any questions you have with your health care provider. Document Released: 01/19/2015 Document Revised: 09/12/2015 Document Reviewed: 10/24/2014 Elsevier Interactive Patient Education  2017 [place] Prevention in the Home Falls can cause injuries. They can happen to people of all ages. There are many things you can do to make your home safe and to help prevent falls. What can I do on the outside of my home? Regularly fix the edges of walkways and driveways and fix any cracks. Remove anything that might make you trip as you walk through a door, such as a raised step or threshold. Trim any bushes or trees on the path to your home. Use bright outdoor lighting. Clear any walking paths of anything that might make someone trip, such as rocks or tools. Regularly check to see if handrails are loose or broken. Make sure that both sides of any steps have handrails. Any raised decks and porches should have guardrails on the edges. Have any leaves, snow, or ice cleared regularly. Use sand or salt on walking paths during winter. Clean up any spills in your garage right away. This includes oil or grease spills. What can I do in the bathroom? Use night lights. Install grab bars by the toilet and in the tub and shower. Do not use towel bars as grab bars. Use non-skid mats or decals in the tub or shower. If you need to sit down in the shower, use a plastic, non-slip stool. Keep the floor dry. Clean up any water that spills on the floor as soon as it happens. Remove soap buildup in the tub or shower regularly. Attach bath mats securely with double-sided non-slip rug tape. Do not have throw rugs and other things on the floor that can make you trip. What can I do in the bedroom? Use night lights. Make sure that you have a light by your bed that is easy to  reach. Do not use any sheets or blankets that are too big for your bed. They should not hang down onto the floor. Have a firm chair that has side arms. You can use this for support while you get dressed. Do not have throw rugs and other things on the floor that can make you trip. What can I do in the kitchen? Clean up any spills right away. Avoid walking on wet floors. Keep items that you use a lot in easy-to-reach places. If you need to reach something above you, use a strong step stool that has a grab bar. Keep electrical cords out of the way. Do not use floor polish or wax that makes floors slippery. If you must use wax, use non-skid floor wax. Do not have throw rugs and other things on the floor that can make you trip. What can I do with my stairs? Do not leave any items on the stairs. Make sure that there are handrails on both sides of the stairs and use them. Fix handrails that are broken or loose. Make sure that handrails are as long as the stairways. Check any carpeting to make sure that it is firmly attached to the stairs. Fix any carpet that is loose or worn. Avoid having throw rugs at the top or bottom of the stairs. If you do have throw rugs, attach them  to the floor with carpet tape. Make sure that you have a light switch at the top of the stairs and the bottom of the stairs. If you do not have them, ask someone to add them for you. What else can I do to help prevent falls? Wear shoes that: Do not have high heels. Have rubber bottoms. Are comfortable and fit you well. Are closed at the toe. Do not wear sandals. If you use a stepladder: Make sure that it is fully opened. Do not climb a closed stepladder. Make sure that both sides of the stepladder are locked into place. Ask someone to hold it for you, if possible. Clearly mark and make sure that you can see: Any grab bars or handrails. First and last steps. Where the edge of each step is. Use tools that help you move  around (mobility aids) if they are needed. These include: Canes. Walkers. Scooters. Crutches. Turn on the lights when you go into a dark area. Replace any light bulbs as soon as they burn out. Set up your furniture so you have a clear path. Avoid moving your furniture around. If any of your floors are uneven, fix them. If there are any pets around you, be aware of where they are. Review your medicines with your doctor. Some medicines can make you feel dizzy. This can increase your chance of falling. Ask your doctor what other things that you can do to help prevent falls. This information is not intended to replace advice given to you by your health care provider. Make sure you discuss any questions you have with your health care provider. Document Released: 10/19/2008 Document Revised: 05/31/2015 Document Reviewed: 01/27/2014 Elsevier Interactive Patient Education  2017 [employer].

## 2022-07-09 DIAGNOSIS — R278 Other lack of coordination: Secondary | ICD-10-CM | POA: Diagnosis not present

## 2022-07-09 DIAGNOSIS — M6389 Disorders of muscle in diseases classified elsewhere, multiple sites: Secondary | ICD-10-CM | POA: Diagnosis not present

## 2022-07-09 DIAGNOSIS — G9341 Metabolic encephalopathy: Secondary | ICD-10-CM | POA: Diagnosis not present

## 2022-07-11 DIAGNOSIS — R278 Other lack of coordination: Secondary | ICD-10-CM | POA: Diagnosis not present

## 2022-07-11 DIAGNOSIS — M6389 Disorders of muscle in diseases classified elsewhere, multiple sites: Secondary | ICD-10-CM | POA: Diagnosis not present

## 2022-07-11 DIAGNOSIS — G9341 Metabolic encephalopathy: Secondary | ICD-10-CM | POA: Diagnosis not present

## 2022-07-14 DIAGNOSIS — M6389 Disorders of muscle in diseases classified elsewhere, multiple sites: Secondary | ICD-10-CM | POA: Diagnosis not present

## 2022-07-14 DIAGNOSIS — R278 Other lack of coordination: Secondary | ICD-10-CM | POA: Diagnosis not present

## 2022-07-14 DIAGNOSIS — G9341 Metabolic encephalopathy: Secondary | ICD-10-CM | POA: Diagnosis not present

## 2022-07-15 DIAGNOSIS — G9341 Metabolic encephalopathy: Secondary | ICD-10-CM | POA: Diagnosis not present

## 2022-07-15 DIAGNOSIS — M6389 Disorders of muscle in diseases classified elsewhere, multiple sites: Secondary | ICD-10-CM | POA: Diagnosis not present

## 2022-07-15 DIAGNOSIS — R278 Other lack of coordination: Secondary | ICD-10-CM | POA: Diagnosis not present

## 2022-07-17 DIAGNOSIS — R278 Other lack of coordination: Secondary | ICD-10-CM | POA: Diagnosis not present

## 2022-07-17 DIAGNOSIS — M6389 Disorders of muscle in diseases classified elsewhere, multiple sites: Secondary | ICD-10-CM | POA: Diagnosis not present

## 2022-07-17 DIAGNOSIS — G9341 Metabolic encephalopathy: Secondary | ICD-10-CM | POA: Diagnosis not present

## 2022-07-21 DIAGNOSIS — G9341 Metabolic encephalopathy: Secondary | ICD-10-CM | POA: Diagnosis not present

## 2022-07-21 DIAGNOSIS — M6389 Disorders of muscle in diseases classified elsewhere, multiple sites: Secondary | ICD-10-CM | POA: Diagnosis not present

## 2022-07-21 DIAGNOSIS — R278 Other lack of coordination: Secondary | ICD-10-CM | POA: Diagnosis not present

## 2022-07-24 DIAGNOSIS — G9341 Metabolic encephalopathy: Secondary | ICD-10-CM | POA: Diagnosis not present

## 2022-07-24 DIAGNOSIS — M6389 Disorders of muscle in diseases classified elsewhere, multiple sites: Secondary | ICD-10-CM | POA: Diagnosis not present

## 2022-07-24 DIAGNOSIS — R278 Other lack of coordination: Secondary | ICD-10-CM | POA: Diagnosis not present

## 2022-07-28 DIAGNOSIS — R278 Other lack of coordination: Secondary | ICD-10-CM | POA: Diagnosis not present

## 2022-07-28 DIAGNOSIS — G9341 Metabolic encephalopathy: Secondary | ICD-10-CM | POA: Diagnosis not present

## 2022-07-28 DIAGNOSIS — M6389 Disorders of muscle in diseases classified elsewhere, multiple sites: Secondary | ICD-10-CM | POA: Diagnosis not present

## 2022-07-29 DIAGNOSIS — G9341 Metabolic encephalopathy: Secondary | ICD-10-CM | POA: Diagnosis not present

## 2022-07-29 DIAGNOSIS — M6389 Disorders of muscle in diseases classified elsewhere, multiple sites: Secondary | ICD-10-CM | POA: Diagnosis not present

## 2022-07-29 DIAGNOSIS — R278 Other lack of coordination: Secondary | ICD-10-CM | POA: Diagnosis not present

## 2022-07-31 DIAGNOSIS — G9341 Metabolic encephalopathy: Secondary | ICD-10-CM | POA: Diagnosis not present

## 2022-07-31 DIAGNOSIS — M6389 Disorders of muscle in diseases classified elsewhere, multiple sites: Secondary | ICD-10-CM | POA: Diagnosis not present

## 2022-07-31 DIAGNOSIS — R278 Other lack of coordination: Secondary | ICD-10-CM | POA: Diagnosis not present

## 2022-08-04 DIAGNOSIS — M6389 Disorders of muscle in diseases classified elsewhere, multiple sites: Secondary | ICD-10-CM | POA: Diagnosis not present

## 2022-08-04 DIAGNOSIS — R278 Other lack of coordination: Secondary | ICD-10-CM | POA: Diagnosis not present

## 2022-08-04 DIAGNOSIS — G9341 Metabolic encephalopathy: Secondary | ICD-10-CM | POA: Diagnosis not present

## 2022-08-05 DIAGNOSIS — G9341 Metabolic encephalopathy: Secondary | ICD-10-CM | POA: Diagnosis not present

## 2022-08-05 DIAGNOSIS — R278 Other lack of coordination: Secondary | ICD-10-CM | POA: Diagnosis not present

## 2022-08-05 DIAGNOSIS — M6389 Disorders of muscle in diseases classified elsewhere, multiple sites: Secondary | ICD-10-CM | POA: Diagnosis not present

## 2022-08-08 DIAGNOSIS — R278 Other lack of coordination: Secondary | ICD-10-CM | POA: Diagnosis not present

## 2022-08-08 DIAGNOSIS — M6389 Disorders of muscle in diseases classified elsewhere, multiple sites: Secondary | ICD-10-CM | POA: Diagnosis not present

## 2022-08-08 DIAGNOSIS — G9341 Metabolic encephalopathy: Secondary | ICD-10-CM | POA: Diagnosis not present

## 2022-08-11 DIAGNOSIS — G9341 Metabolic encephalopathy: Secondary | ICD-10-CM | POA: Diagnosis not present

## 2022-08-11 DIAGNOSIS — M6389 Disorders of muscle in diseases classified elsewhere, multiple sites: Secondary | ICD-10-CM | POA: Diagnosis not present

## 2022-08-11 DIAGNOSIS — R278 Other lack of coordination: Secondary | ICD-10-CM | POA: Diagnosis not present

## 2022-08-12 DIAGNOSIS — M6389 Disorders of muscle in diseases classified elsewhere, multiple sites: Secondary | ICD-10-CM | POA: Diagnosis not present

## 2022-08-12 DIAGNOSIS — R278 Other lack of coordination: Secondary | ICD-10-CM | POA: Diagnosis not present

## 2022-08-12 DIAGNOSIS — G9341 Metabolic encephalopathy: Secondary | ICD-10-CM | POA: Diagnosis not present

## 2022-08-14 DIAGNOSIS — M6389 Disorders of muscle in diseases classified elsewhere, multiple sites: Secondary | ICD-10-CM | POA: Diagnosis not present

## 2022-08-14 DIAGNOSIS — R278 Other lack of coordination: Secondary | ICD-10-CM | POA: Diagnosis not present

## 2022-08-14 DIAGNOSIS — G9341 Metabolic encephalopathy: Secondary | ICD-10-CM | POA: Diagnosis not present

## 2022-08-18 DIAGNOSIS — R278 Other lack of coordination: Secondary | ICD-10-CM | POA: Diagnosis not present

## 2022-08-18 DIAGNOSIS — G9341 Metabolic encephalopathy: Secondary | ICD-10-CM | POA: Diagnosis not present

## 2022-08-18 DIAGNOSIS — M6389 Disorders of muscle in diseases classified elsewhere, multiple sites: Secondary | ICD-10-CM | POA: Diagnosis not present

## 2022-08-19 DIAGNOSIS — R278 Other lack of coordination: Secondary | ICD-10-CM | POA: Diagnosis not present

## 2022-08-19 DIAGNOSIS — M6389 Disorders of muscle in diseases classified elsewhere, multiple sites: Secondary | ICD-10-CM | POA: Diagnosis not present

## 2022-08-19 DIAGNOSIS — G9341 Metabolic encephalopathy: Secondary | ICD-10-CM | POA: Diagnosis not present

## 2022-08-21 DIAGNOSIS — M6389 Disorders of muscle in diseases classified elsewhere, multiple sites: Secondary | ICD-10-CM | POA: Diagnosis not present

## 2022-08-21 DIAGNOSIS — G9341 Metabolic encephalopathy: Secondary | ICD-10-CM | POA: Diagnosis not present

## 2022-08-21 DIAGNOSIS — R278 Other lack of coordination: Secondary | ICD-10-CM | POA: Diagnosis not present

## 2022-08-25 DIAGNOSIS — R278 Other lack of coordination: Secondary | ICD-10-CM | POA: Diagnosis not present

## 2022-08-25 DIAGNOSIS — M6389 Disorders of muscle in diseases classified elsewhere, multiple sites: Secondary | ICD-10-CM | POA: Diagnosis not present

## 2022-08-25 DIAGNOSIS — G9341 Metabolic encephalopathy: Secondary | ICD-10-CM | POA: Diagnosis not present

## 2022-08-26 DIAGNOSIS — M6389 Disorders of muscle in diseases classified elsewhere, multiple sites: Secondary | ICD-10-CM | POA: Diagnosis not present

## 2022-08-26 DIAGNOSIS — R278 Other lack of coordination: Secondary | ICD-10-CM | POA: Diagnosis not present

## 2022-08-26 DIAGNOSIS — G9341 Metabolic encephalopathy: Secondary | ICD-10-CM | POA: Diagnosis not present

## 2022-08-28 ENCOUNTER — Telehealth: Payer: Medicare Other | Admitting: Family

## 2022-08-28 DIAGNOSIS — R278 Other lack of coordination: Secondary | ICD-10-CM | POA: Diagnosis not present

## 2022-08-28 DIAGNOSIS — M6389 Disorders of muscle in diseases classified elsewhere, multiple sites: Secondary | ICD-10-CM | POA: Diagnosis not present

## 2022-08-28 DIAGNOSIS — G9341 Metabolic encephalopathy: Secondary | ICD-10-CM | POA: Diagnosis not present

## 2022-08-28 NOTE — Telephone Encounter (Signed)
[facility] Nurse called to report patient's HR running in the 100's -150's.patient denies chest pain.Has notify patient's son who is a Proofreader.son request the rest of the patient's atenolol to be administered.patient had Atenolol 12.5 mg tablet in the morning.Orders given to administered atenolol 12.5 mg tablet x 1 dose then recheck HR.Please follow up.

## 2022-08-29 ENCOUNTER — Encounter: Payer: Self-pay | Admitting: Adult Health

## 2022-08-29 ENCOUNTER — Non-Acute Institutional Stay: Payer: Medicare Other | Admitting: Adult Health

## 2022-08-29 DIAGNOSIS — I10 Essential (primary) hypertension: Secondary | ICD-10-CM | POA: Diagnosis not present

## 2022-08-29 DIAGNOSIS — G9341 Metabolic encephalopathy: Secondary | ICD-10-CM | POA: Diagnosis not present

## 2022-08-29 DIAGNOSIS — M6389 Disorders of muscle in diseases classified elsewhere, multiple sites: Secondary | ICD-10-CM | POA: Diagnosis not present

## 2022-08-29 DIAGNOSIS — I480 Paroxysmal atrial fibrillation: Secondary | ICD-10-CM

## 2022-08-29 DIAGNOSIS — R278 Other lack of coordination: Secondary | ICD-10-CM | POA: Diagnosis not present

## 2022-08-29 NOTE — Progress Notes (Signed)
Location:  Medical illustrator of Service:  ALF (13) Provider:   Peggye Ley, ANP [facility] [phone]   Fletcher Anon, NP  Patient Care Team: Fletcher Anon, NP as PCP - General (Nurse Practitioner) [place], Peter M, MD as PCP - Cardiology (Cardiology) Melvenia Needles, MD as Consulting Physician (Ophthalmology) [place], Peter M, MD as Consulting Physician (Cardiology) Nita Sells, MD as Consulting Physician (Dermatology) Karie Soda, MD as Consulting Physician (General Surgery) Community, Well Warnell Forester, MD as Consulting Physician (Hematology and Oncology) Magrinat, Valentino Hue, MD (Inactive) as Consulting Physician (Oncology) Maris Berger, MD as Consulting Physician (Ophthalmology)  Extended Emergency Contact Information Primary Emergency Contact: Adan Sis of [place] Home Phone: [phone] Work Phone: [phone] Mobile Phone: [phone] Relation: Son Secondary Emergency Contact: Arther Abbott of [place] Home Phone: [phone] Mobile Phone: [phone] Relation: Son  Code Status:  DNR Goals of care: Advanced Directive information    07/07/2022    1:54 PM  Advanced Directives  Does Patient Have a Medical Advance Directive? Yes  Type of Advance Directive Out of facility DNR (pink MOST or yellow form)  Does patient want to make changes to medical advance directive? No - Patient declined  Copy of Healthcare Power of Attorney in Chart? No - copy requested  Pre-existing out of facility DNR order (yellow form or pink MOST form) Yellow form placed in chart (order not valid for inpatient use)     Chief Complaint  Patient presents with   Acute Visit    tachycardia    HPI:  Pt is a 89 y.o. female seen today for an acute visit for tachycardia.   PMH significant for memory loss, HTN, afib, palpitations, breast ca, hyponatremia, OP, hypercholesterolemia.   Nurse reports  that Ms. Lacrosse "felt funny" last evening 08/28/22 and vitals were checked. BP 125/81 HR 154 RR 18 02 sat 96%. Apical pulse 110 resting but irregular On provider notified and one dose of atenolol 12.5 mg given. HR reduced to 72.  The nurse did not noticed any other symptoms such as anxiety or sob HR runs typically 58-68 in review of matrix records.   Her nurse does report she can be restless at night and gets up and down and walks frequently through the hallway.  She was noted to have atypical chest pain in June. Started on Prilosec. No new concerns here. No aggressive work up due to her age and underlying memory loss.   She does report feeling constipated and took MOM this am  Reviewed CBC BMP done in June 2024 and TSH done May 2024.     Past Medical History:  Diagnosis Date   Acute upper respiratory infections of unspecified site 06/09/2011   Aphasia    Breast cancer (HCC) 12/11/2008   Encounter for general adult medical examination without abnormal findings    Encounter for immunization    Fatigue 01/2012   Hypercholesterolemia    on lipitor   Hypertension    Hyponatremia 01/2016   Impacted cerumen, bilateral    Memory loss    Mild cognitive impairment of uncertain or unknown etiology    Mitral valve prolapse    Nocturia    Other abnormal blood chemistry 01/21/2010   Palpitations 08/05/2000   Pure hypercholesterolemia, unspecified    Right bundle branch block    Sacrococcygeal disorders, not elsewhere classified    Scoliosis    Senile osteoporosis 09/28/2001   Unilateral primary osteoarthritis, left hip  Unspecified dementia, unspecified severity, without behavioral disturbance, psychotic disturbance, mood disturbance, and anxiety (HCC)    Unspecified hearing loss, bilateral    Unsteadiness on feet    Vascular dementia, unspecified severity, without behavioral disturbance, psychotic disturbance, mood disturbance, and anxiety (HCC)    Past Surgical History:  Procedure  Laterality Date   BREAST LUMPECTOMY Right 12/27/2007   LAPAROSCOPIC LYSIS OF ADHESIONS  07/15/2006   Dr. Michaell Cowing   OTHER SURGICAL HISTORY     TONSILLECTOMY     TUBAL LIGATION  1963    No Known Allergies  Outpatient Encounter Medications as of 08/29/2022  Medication Sig   acetaminophen (TYLENOL) 325 MG tablet Take 650 mg by mouth as needed for mild pain.   atenolol (TENORMIN) 25 MG tablet Take 1/2 tab daily   Carboxymethylcellulose Sod PF 0.25 % SOLN Apply 1 drop to eye 3 (three) times daily as needed. 1 drop in both eyes TID PRN   Cholecalciferol (VITAMIN D3) 25 MCG (1000 UT) CAPS Take 1 capsule (1,000 Units total) by mouth daily.   Emollient (EUCERIN) lotion Apply topically daily.   lidocaine 4 % Place 1 patch onto the skin daily as needed.   Olopatadine HCl 0.2 % SOLN Apply to eye.   omeprazole (PRILOSEC) 20 MG capsule Take 20 mg by mouth daily.   No facility-administered encounter medications on file as of 08/29/2022.    Review of Systems  Constitutional:  Negative for activity change, appetite change, chills, diaphoresis, fatigue, fever and unexpected weight change.  HENT:  Negative for congestion.   Respiratory:  Negative for cough, shortness of breath and wheezing.   Cardiovascular:  Positive for palpitations. Negative for chest pain and leg swelling.  Gastrointestinal:  Positive for constipation. Negative for abdominal distention, abdominal pain and diarrhea.  Genitourinary:  Negative for difficulty urinating and dysuria.  Musculoskeletal:  Negative for arthralgias, back pain, gait problem, joint swelling and myalgias.  Neurological:  Negative for dizziness, tremors, seizures, syncope, facial asymmetry, speech difficulty, weakness, light-headedness, numbness and headaches.  Psychiatric/Behavioral:  Negative for agitation, behavioral problems and confusion.        Memory loss    Immunization History  Administered Date(s) Administered   COVID-19, mRNA, vaccine(Comirnaty)12  years and older 10/30/2021   Fluad Quad(high Dose 65+) 10/08/2021   Influenza Split 10/23/2011   Influenza, High Dose Seasonal PF 09/20/2015, 10/09/2016, 10/15/2018   Influenza,inj,Quad PF,6+ Mos 10/28/2013, 10/30/2017   Influenza-Unspecified 10/06/2012, 10/26/2014   Moderna Sars-Covid-2 Vaccination 01/16/2019, 02/15/2019, 11/22/2019, 10/19/2020   Pneumococcal Conjugate-13 02/14/2014   Pneumococcal Polysaccharide-23 01/06/1997   Tdap 05/13/2017   Tetanus 05/13/2017   Varicella 01/14/2018   Zoster Recombinant(Shingrix) 10/09/2016, 09/17/2017, 01/14/2018   Zoster, Live 01/07/1999   Pertinent  Health Maintenance Due  Topic Date Due   INFLUENZA VACCINE  08/07/2022   DEXA SCAN  Completed      02/18/2022    9:00 AM 04/14/2022    9:30 AM 05/19/2022    2:57 PM 06/30/2022    2:19 PM 07/07/2022    1:53 PM  Fall Risk  Falls in the past year? 1 1 1 1 1   Was there an injury with Fall? 1 1 1 1 1   Fall Risk Category Calculator 2 2 2 2 2   Patient at Risk for Falls Due to History of fall(s) History of fall(s) History of fall(s) History of fall(s) History of fall(s)  Fall risk Follow up Falls evaluation completed Falls evaluation completed Falls evaluation completed Falls evaluation completed Falls evaluation completed  Functional Status Survey:    Vitals:   08/29/22 1223  BP: 124/81  Pulse: 60  Resp: 18  SpO2: 96%   There is no height or weight on file to calculate BMI. Physical Exam Vitals and nursing note reviewed.  Constitutional:      General: She is not in acute distress.    Appearance: She is not diaphoretic.  HENT:     Head: Normocephalic and atraumatic.  Neck:     Vascular: No JVD.  Cardiovascular:     Rate and Rhythm: Normal rate and regular rhythm.     Heart sounds: No murmur heard.    Comments: Apical HR 60 Pulmonary:     Effort: Pulmonary effort is normal. No respiratory distress.     Breath sounds: Normal breath sounds. No wheezing.  Abdominal:     General: Bowel  sounds are normal. There is no distension.     Palpations: Abdomen is soft.     Tenderness: There is no abdominal tenderness.  Musculoskeletal:     Cervical back: No rigidity or tenderness.     Right lower leg: No edema.     Left lower leg: No edema.  Lymphadenopathy:     Cervical: No cervical adenopathy.  Skin:    General: Skin is warm and dry.  Neurological:     General: No focal deficit present.     Mental Status: She is alert. Mental status is at baseline.  Psychiatric:        Mood and Affect: Mood normal.     Labs reviewed: Recent Labs    01/08/22 1212 05/08/22 0000 06/11/22 0000  NA 135 132* 134*  K 4.5 4.5 4.2  CL 101 96* 98*  CO2 27 22 24*  GLUCOSE 109*  --   --   BUN 21 11 21   CREATININE 0.96 0.6 0.7  CALCIUM 9.9 9.3 9.0   Recent Labs    05/08/22 0000  AST 24  ALT 13  ALKPHOS 88  ALBUMIN 3.9   Recent Labs    01/08/22 1212 05/08/22 0000  WBC 7.8 6.9  NEUTROABS 4.8  --   HGB 14.1 14.0  HCT 42.2 41  MCV 97.5  --   PLT 279 404*   Lab Results  Component Value Date   TSH 1.52 05/08/2022   Lab Results  Component Value Date   HGBA1C 5.9 (H) 01/23/2016   Lab Results  Component Value Date   CHOL 369 (A) 08/28/2020   HDL 51 08/28/2020   LDLCALC 284 08/28/2020   TRIG 170 (A) 08/28/2020   CHOLHDL 3.5 01/27/2016    Significant Diagnostic Results in last 30 days:  No results found.  Assessment/Plan 1. PAF (paroxysmal atrial fibrillation) (HCC)  Good response to low dose atenolol after a period of tachycardia. HR is 60 and regular on exam. Will leave order for atenolol 12.5 mg every day prn for palpitations and/or HR >100. Consider how anxiety plays a role in her symptoms, nurse will monitor and report back concerns.    Followed by Dr [place] with cardiology, not on DOAC due to risk vs benefit discussion.     2. Constipation MOM given waiting on response      Labs/tests ordered:  had labs in June, f/u scheduled with Dr Chales Abrahams 9/17

## 2022-09-01 DIAGNOSIS — G9341 Metabolic encephalopathy: Secondary | ICD-10-CM | POA: Diagnosis not present

## 2022-09-01 DIAGNOSIS — M6389 Disorders of muscle in diseases classified elsewhere, multiple sites: Secondary | ICD-10-CM | POA: Diagnosis not present

## 2022-09-01 DIAGNOSIS — R278 Other lack of coordination: Secondary | ICD-10-CM | POA: Diagnosis not present

## 2022-09-04 DIAGNOSIS — M6389 Disorders of muscle in diseases classified elsewhere, multiple sites: Secondary | ICD-10-CM | POA: Diagnosis not present

## 2022-09-04 DIAGNOSIS — G9341 Metabolic encephalopathy: Secondary | ICD-10-CM | POA: Diagnosis not present

## 2022-09-04 DIAGNOSIS — R278 Other lack of coordination: Secondary | ICD-10-CM | POA: Diagnosis not present

## 2022-09-08 DIAGNOSIS — G9341 Metabolic encephalopathy: Secondary | ICD-10-CM | POA: Diagnosis not present

## 2022-09-08 DIAGNOSIS — R278 Other lack of coordination: Secondary | ICD-10-CM | POA: Diagnosis not present

## 2022-09-08 DIAGNOSIS — M6389 Disorders of muscle in diseases classified elsewhere, multiple sites: Secondary | ICD-10-CM | POA: Diagnosis not present

## 2022-09-09 DIAGNOSIS — R278 Other lack of coordination: Secondary | ICD-10-CM | POA: Diagnosis not present

## 2022-09-09 DIAGNOSIS — M6389 Disorders of muscle in diseases classified elsewhere, multiple sites: Secondary | ICD-10-CM | POA: Diagnosis not present

## 2022-09-09 DIAGNOSIS — G9341 Metabolic encephalopathy: Secondary | ICD-10-CM | POA: Diagnosis not present

## 2022-09-13 DIAGNOSIS — M6389 Disorders of muscle in diseases classified elsewhere, multiple sites: Secondary | ICD-10-CM | POA: Diagnosis not present

## 2022-09-13 DIAGNOSIS — G9341 Metabolic encephalopathy: Secondary | ICD-10-CM | POA: Diagnosis not present

## 2022-09-13 DIAGNOSIS — R278 Other lack of coordination: Secondary | ICD-10-CM | POA: Diagnosis not present

## 2022-09-15 DIAGNOSIS — M6389 Disorders of muscle in diseases classified elsewhere, multiple sites: Secondary | ICD-10-CM | POA: Diagnosis not present

## 2022-09-15 DIAGNOSIS — G9341 Metabolic encephalopathy: Secondary | ICD-10-CM | POA: Diagnosis not present

## 2022-09-15 DIAGNOSIS — R278 Other lack of coordination: Secondary | ICD-10-CM | POA: Diagnosis not present

## 2022-09-16 DIAGNOSIS — G9341 Metabolic encephalopathy: Secondary | ICD-10-CM | POA: Diagnosis not present

## 2022-09-16 DIAGNOSIS — M6389 Disorders of muscle in diseases classified elsewhere, multiple sites: Secondary | ICD-10-CM | POA: Diagnosis not present

## 2022-09-16 DIAGNOSIS — R278 Other lack of coordination: Secondary | ICD-10-CM | POA: Diagnosis not present

## 2022-09-18 DIAGNOSIS — R278 Other lack of coordination: Secondary | ICD-10-CM | POA: Diagnosis not present

## 2022-09-18 DIAGNOSIS — G9341 Metabolic encephalopathy: Secondary | ICD-10-CM | POA: Diagnosis not present

## 2022-09-18 DIAGNOSIS — M6389 Disorders of muscle in diseases classified elsewhere, multiple sites: Secondary | ICD-10-CM | POA: Diagnosis not present

## 2022-09-22 DIAGNOSIS — G9341 Metabolic encephalopathy: Secondary | ICD-10-CM | POA: Diagnosis not present

## 2022-09-22 DIAGNOSIS — M6389 Disorders of muscle in diseases classified elsewhere, multiple sites: Secondary | ICD-10-CM | POA: Diagnosis not present

## 2022-09-22 DIAGNOSIS — R278 Other lack of coordination: Secondary | ICD-10-CM | POA: Diagnosis not present

## 2022-09-23 ENCOUNTER — Non-Acute Institutional Stay: Payer: Medicare Other | Admitting: Internal Medicine

## 2022-09-23 ENCOUNTER — Encounter: Payer: Self-pay | Admitting: Internal Medicine

## 2022-09-23 VITALS — BP 128/82 | HR 62 | Temp 97.9°F | Resp 17 | Ht 62.0 in | Wt 117.2 lb

## 2022-09-23 DIAGNOSIS — M6389 Disorders of muscle in diseases classified elsewhere, multiple sites: Secondary | ICD-10-CM | POA: Diagnosis not present

## 2022-09-23 DIAGNOSIS — I10 Essential (primary) hypertension: Secondary | ICD-10-CM | POA: Diagnosis not present

## 2022-09-23 DIAGNOSIS — K219 Gastro-esophageal reflux disease without esophagitis: Secondary | ICD-10-CM | POA: Diagnosis not present

## 2022-09-23 DIAGNOSIS — R278 Other lack of coordination: Secondary | ICD-10-CM | POA: Diagnosis not present

## 2022-09-23 DIAGNOSIS — R0789 Other chest pain: Secondary | ICD-10-CM

## 2022-09-23 DIAGNOSIS — G9341 Metabolic encephalopathy: Secondary | ICD-10-CM | POA: Diagnosis not present

## 2022-09-23 DIAGNOSIS — F0390 Unspecified dementia without behavioral disturbance: Secondary | ICD-10-CM

## 2022-09-23 DIAGNOSIS — I480 Paroxysmal atrial fibrillation: Secondary | ICD-10-CM | POA: Diagnosis not present

## 2022-09-23 NOTE — Progress Notes (Signed)
Location:  [facility] features editor of Service:  Clinic (12)  Provider:   Code Status: DNR Goals of Care:     09/23/2022   10:59 AM  Advanced Directives  Does Patient Have a Medical Advance Directive? Yes  Type of Estate agent of [place];Out of facility DNR (pink MOST or yellow form);Living will  Does patient want to make changes to medical advance directive? No - Patient declined  Copy of Healthcare Power of Attorney in Chart? No - copy requested     Chief Complaint  Patient presents with   Medical Management of Chronic Issues    Patient is being seen for a 4 month follow up.    Immunizations    Patient is due for flu and covid vaccine     HPI: Patient is a 89 y.o. female seen today for medical management of chronic diseases.    Lives in [place] in [place]   Has h/o Hyponatremia, Palpitations/PAF, Osteoporosis, Hyperlipidemia, MCI   History of multiple right rib fractures   Has been having Atypical Chest pain Usually happens in the evening/ Early Morning On Prilosec Head elevation Also Tylenol PRN which seems to help POA does not want aggressive work up or send her to ED  H/o A Fib No anticoagulation due to frailty  Can get Extro Metoprolol for palpations  Worsening Cognition Needs help with her showers Has some  Incontinence episodes Walks with no assist No Falls recently Pleasantly confused  Wt Readings from Last 3 Encounters:  09/23/22 117 lb 3.7 oz (53.2 kg)  07/07/22 114 lb 3.2 oz (51.8 kg)  06/30/22 109 lb 12.8 oz (49.8 kg)     Past Medical History:  Diagnosis Date   Acute upper respiratory infections of unspecified site 06/09/2011   Aphasia    Breast cancer (HCC) 12/11/2008   Encounter for general adult medical examination without abnormal findings    Encounter for immunization    Fatigue 01/2012   Hypercholesterolemia    on lipitor   Hypertension    Hyponatremia 01/2016   Impacted cerumen, bilateral    Memory  loss    Mild cognitive impairment of uncertain or unknown etiology    Mitral valve prolapse    Nocturia    Other abnormal blood chemistry 01/21/2010   Palpitations 08/05/2000   Pure hypercholesterolemia, unspecified    Right bundle branch block    Sacrococcygeal disorders, not elsewhere classified    Scoliosis    Senile osteoporosis 09/28/2001   Unilateral primary osteoarthritis, left hip    Unspecified dementia, unspecified severity, without behavioral disturbance, psychotic disturbance, mood disturbance, and anxiety (HCC)    Unspecified hearing loss, bilateral    Unsteadiness on feet    Vascular dementia, unspecified severity, without behavioral disturbance, psychotic disturbance, mood disturbance, and anxiety (HCC)     Past Surgical History:  Procedure Laterality Date   BREAST LUMPECTOMY Right 12/27/2007   LAPAROSCOPIC LYSIS OF ADHESIONS  07/15/2006   Dr. Michaell Cowing   OTHER SURGICAL HISTORY     TONSILLECTOMY     TUBAL LIGATION  1963    No Known Allergies  Outpatient Encounter Medications as of 09/23/2022  Medication Sig   acetaminophen (TYLENOL) 325 MG tablet Take 650 mg by mouth as needed for mild pain.   atenolol (TENORMIN) 25 MG tablet Take 1/2 tab daily   atenolol (TENORMIN) 25 MG tablet Take 12.5 mg by mouth daily as needed. Palpitations and/or HR >100   Carboxymethylcellulose Sod PF 0.25 %  SOLN Apply 1 drop to eye 3 (three) times daily as needed. 1 drop in both eyes TID PRN   Cholecalciferol (VITAMIN D3) 25 MCG (1000 UT) CAPS Take 1 capsule (1,000 Units total) by mouth daily.   Emollient (EUCERIN) lotion Apply topically daily.   lidocaine 4 % Place 1 patch onto the skin daily as needed.   Olopatadine HCl 0.2 % SOLN Apply to eye.   omeprazole (PRILOSEC) 20 MG capsule Take 20 mg by mouth daily.   sodium chloride (OCEAN) 0.65 % nasal spray Place 1 spray into the nose every 2 (two) hours as needed for congestion.   No facility-administered encounter medications on file as of  09/23/2022.    Review of Systems:  Review of Systems  Unable to perform ROS: Dementia    Health Maintenance  Topic Date Due   INFLUENZA VACCINE  08/07/2022   COVID-19 Vaccine (6 - 2023-24 season) 09/07/2022   Medicare Annual Wellness (AWV)  07/07/2023   DTaP/Tdap/Td (2 - Td or Tdap) 05/14/2027   Pneumonia Vaccine 33+ Years old  Completed   DEXA SCAN  Completed   Zoster Vaccines- Shingrix  Completed   HPV VACCINES  Aged Out    Physical Exam: Vitals:   09/23/22 1057  BP: 128/82  Pulse: 62  Resp: 17  Temp: 97.9 F (36.6 C)  TempSrc: Temporal  Weight: 117 lb 3.7 oz (53.2 kg)  Height: 5\' 2"  (1.575 m)   Body mass index is 21.44 kg/m. Physical Exam Vitals reviewed.  Constitutional:      Appearance: Normal appearance.  HENT:     Head: Normocephalic.     Nose: Nose normal.     Mouth/Throat:     Mouth: Mucous membranes are moist.     Pharynx: Oropharynx is clear.  Eyes:     Pupils: Pupils are equal, round, and reactive to light.  Cardiovascular:     Rate and Rhythm: Normal rate and regular rhythm.     Pulses: Normal pulses.     Heart sounds: Murmur heard.  Pulmonary:     Effort: Pulmonary effort is normal.     Breath sounds: Normal breath sounds.  Abdominal:     General: Abdomen is flat. Bowel sounds are normal.     Palpations: Abdomen is soft.  Musculoskeletal:        General: No swelling.     Cervical back: Neck supple.  Skin:    General: Skin is warm.  Neurological:     General: No focal deficit present.     Mental Status: She is alert and oriented to person, place, and time.  Psychiatric:        Mood and Affect: Mood normal.        Thought Content: Thought content normal.     Labs reviewed: Basic Metabolic Panel: Recent Labs    01/08/22 1212 05/08/22 0000 06/11/22 0000  NA 135 132* 134*  K 4.5 4.5 4.2  CL 101 96* 98*  CO2 27 22 24*  GLUCOSE 109*  --   --   BUN 21 11 21   CREATININE 0.96 0.6 0.7  CALCIUM 9.9 9.3 9.0  TSH  --  1.52  --     Liver Function Tests: Recent Labs    05/08/22 0000  AST 24  ALT 13  ALKPHOS 88  ALBUMIN 3.9   No results for input(s): "LIPASE", "AMYLASE" in the last 8760 hours. No results for input(s): "AMMONIA" in the last 8760 hours. CBC: Recent Labs  01/08/22 1212 05/08/22 0000  WBC 7.8 6.9  NEUTROABS 4.8  --   HGB 14.1 14.0  HCT 42.2 41  MCV 97.5  --   PLT 279 404*   Lipid Panel: No results for input(s): "CHOL", "HDL", "LDLCALC", "TRIG", "CHOLHDL", "LDLDIRECT" in the last 8760 hours. Lab Results  Component Value Date   HGBA1C 5.9 (H) 01/23/2016    Procedures since last visit: No results found.  Assessment/Plan 1. PAF (paroxysmal atrial fibrillation) (HCC) Atenolol No Anticoagulation due to age  81. Essential hypertension, benign Atenolol  3. Atypical chest pain Tylenol seems to be helping  Dr Annabell Howells does not want extensive workup due to her age and dementia  4. Gastroesophageal reflux disease, unspecified whether esophagitis present Prilosec   5. Dementia without behavioral disturbance (HCC) Managing in AL 6 Hyponatremia Sodium stable   Labs/tests ordered:  * No order type specified * Next appt:  12/22/2022

## 2022-09-25 DIAGNOSIS — M6389 Disorders of muscle in diseases classified elsewhere, multiple sites: Secondary | ICD-10-CM | POA: Diagnosis not present

## 2022-09-25 DIAGNOSIS — G9341 Metabolic encephalopathy: Secondary | ICD-10-CM | POA: Diagnosis not present

## 2022-09-25 DIAGNOSIS — R278 Other lack of coordination: Secondary | ICD-10-CM | POA: Diagnosis not present

## 2022-09-29 DIAGNOSIS — R278 Other lack of coordination: Secondary | ICD-10-CM | POA: Diagnosis not present

## 2022-09-29 DIAGNOSIS — M6389 Disorders of muscle in diseases classified elsewhere, multiple sites: Secondary | ICD-10-CM | POA: Diagnosis not present

## 2022-09-29 DIAGNOSIS — G9341 Metabolic encephalopathy: Secondary | ICD-10-CM | POA: Diagnosis not present

## 2022-09-30 DIAGNOSIS — G9341 Metabolic encephalopathy: Secondary | ICD-10-CM | POA: Diagnosis not present

## 2022-09-30 DIAGNOSIS — M6389 Disorders of muscle in diseases classified elsewhere, multiple sites: Secondary | ICD-10-CM | POA: Diagnosis not present

## 2022-09-30 DIAGNOSIS — R278 Other lack of coordination: Secondary | ICD-10-CM | POA: Diagnosis not present

## 2022-10-02 DIAGNOSIS — R278 Other lack of coordination: Secondary | ICD-10-CM | POA: Diagnosis not present

## 2022-10-02 DIAGNOSIS — G9341 Metabolic encephalopathy: Secondary | ICD-10-CM | POA: Diagnosis not present

## 2022-10-02 DIAGNOSIS — M6389 Disorders of muscle in diseases classified elsewhere, multiple sites: Secondary | ICD-10-CM | POA: Diagnosis not present

## 2022-10-07 DIAGNOSIS — M6389 Disorders of muscle in diseases classified elsewhere, multiple sites: Secondary | ICD-10-CM | POA: Diagnosis not present

## 2022-10-07 DIAGNOSIS — R278 Other lack of coordination: Secondary | ICD-10-CM | POA: Diagnosis not present

## 2022-10-07 DIAGNOSIS — G9341 Metabolic encephalopathy: Secondary | ICD-10-CM | POA: Diagnosis not present

## 2022-10-14 DIAGNOSIS — R278 Other lack of coordination: Secondary | ICD-10-CM | POA: Diagnosis not present

## 2022-10-14 DIAGNOSIS — M6389 Disorders of muscle in diseases classified elsewhere, multiple sites: Secondary | ICD-10-CM | POA: Diagnosis not present

## 2022-10-14 DIAGNOSIS — G9341 Metabolic encephalopathy: Secondary | ICD-10-CM | POA: Diagnosis not present

## 2022-10-23 DIAGNOSIS — G9341 Metabolic encephalopathy: Secondary | ICD-10-CM | POA: Diagnosis not present

## 2022-10-23 DIAGNOSIS — M6389 Disorders of muscle in diseases classified elsewhere, multiple sites: Secondary | ICD-10-CM | POA: Diagnosis not present

## 2022-10-23 DIAGNOSIS — R278 Other lack of coordination: Secondary | ICD-10-CM | POA: Diagnosis not present

## 2022-11-03 DIAGNOSIS — M6389 Disorders of muscle in diseases classified elsewhere, multiple sites: Secondary | ICD-10-CM | POA: Diagnosis not present

## 2022-11-03 DIAGNOSIS — R278 Other lack of coordination: Secondary | ICD-10-CM | POA: Diagnosis not present

## 2022-11-03 DIAGNOSIS — G9341 Metabolic encephalopathy: Secondary | ICD-10-CM | POA: Diagnosis not present

## 2022-11-11 DIAGNOSIS — R278 Other lack of coordination: Secondary | ICD-10-CM | POA: Diagnosis not present

## 2022-11-11 DIAGNOSIS — M6281 Muscle weakness (generalized): Secondary | ICD-10-CM | POA: Diagnosis not present

## 2022-11-11 DIAGNOSIS — M6389 Disorders of muscle in diseases classified elsewhere, multiple sites: Secondary | ICD-10-CM | POA: Diagnosis not present

## 2022-11-18 DIAGNOSIS — R278 Other lack of coordination: Secondary | ICD-10-CM | POA: Diagnosis not present

## 2022-11-18 DIAGNOSIS — M6389 Disorders of muscle in diseases classified elsewhere, multiple sites: Secondary | ICD-10-CM | POA: Diagnosis not present

## 2022-11-18 DIAGNOSIS — M6281 Muscle weakness (generalized): Secondary | ICD-10-CM | POA: Diagnosis not present

## 2022-12-22 ENCOUNTER — Encounter: Payer: Self-pay | Admitting: Adult Health

## 2022-12-22 ENCOUNTER — Non-Acute Institutional Stay: Payer: Medicare Other | Admitting: Adult Health

## 2022-12-22 VITALS — BP 130/72 | HR 64 | Temp 97.7°F | Resp 16 | Ht 62.0 in | Wt 121.0 lb

## 2022-12-22 DIAGNOSIS — K5901 Slow transit constipation: Secondary | ICD-10-CM

## 2022-12-22 DIAGNOSIS — R0789 Other chest pain: Secondary | ICD-10-CM

## 2022-12-22 DIAGNOSIS — I480 Paroxysmal atrial fibrillation: Secondary | ICD-10-CM

## 2022-12-22 DIAGNOSIS — L853 Xerosis cutis: Secondary | ICD-10-CM

## 2022-12-22 DIAGNOSIS — R739 Hyperglycemia, unspecified: Secondary | ICD-10-CM | POA: Diagnosis not present

## 2022-12-22 DIAGNOSIS — M810 Age-related osteoporosis without current pathological fracture: Secondary | ICD-10-CM | POA: Diagnosis not present

## 2022-12-22 DIAGNOSIS — F0390 Unspecified dementia without behavioral disturbance: Secondary | ICD-10-CM

## 2022-12-22 NOTE — Progress Notes (Signed)
Location:  Medical illustrator of Service:  Clinic (12) Provider:   Peggye Ley, ANP [facility] [phone]   Fletcher Anon, NP  Patient Care Team: Fletcher Anon, NP as PCP - General (Nurse Practitioner) [place], Peter M, MD as PCP - Cardiology (Cardiology) Melvenia Needles, MD as Consulting Physician (Ophthalmology) [place], Peter M, MD as Consulting Physician (Cardiology) Nita Sells, MD as Consulting Physician (Dermatology) Karie Soda, MD as Consulting Physician (General Surgery) Community, Well Warnell Forester, MD as Consulting Physician (Hematology and Oncology) Magrinat, Valentino Hue, MD (Inactive) as Consulting Physician (Oncology) Maris Berger, MD as Consulting Physician (Ophthalmology)  Extended Emergency Contact Information Primary Emergency Contact: Adan Sis of [place] Home Phone: [phone] Work Phone: [phone] Mobile Phone: [phone] Relation: Son Secondary Emergency Contact: Arther Abbott of [place] Home Phone: [phone] Mobile Phone: [phone] Relation: Son  Code Status:  DNR Goals of care: Advanced Directive information    09/23/2022   10:59 AM  Advanced Directives  Does Patient Have a Medical Advance Directive? Yes  Type of Estate agent of [place];Out of facility DNR (pink MOST or yellow form);Living will  Does patient want to make changes to medical advance directive? No - Patient declined  Copy of Healthcare Power of Attorney in Chart? No - copy requested     Chief Complaint  Patient presents with   Medical Management of Chronic Issues    Patient  is here for 3 month follow up    HPI:  Pt is a 89 y.o. female seen today for medical management  PMH significant for memory loss, HTN, afib, palpitations, breast ca, hyponatremia, OP, hypercholesterolemia.   Memory loss: The nurse reports she is independent with dressing but  needs reminders to change to clean clothes. Also needs cuing for showers. Remains ambulatory without a device. MMSE 22/30 01/2022, 25/30 07/2022  Atypical chest pain: using Prilosec. Family declined extensive workup.  She had an episode of chest pain and felt she could not breath this am 12/16 at 1030.  She was given tylenol and comforted. Vitals were WNL.        Past Medical History:  Diagnosis Date   Acute upper respiratory infections of unspecified site 06/09/2011   Aphasia    Breast cancer (HCC) 12/11/2008   Encounter for general adult medical examination without abnormal findings    Encounter for immunization    Fatigue 01/2012   Hypercholesterolemia    on lipitor   Hypertension    Hyponatremia 01/2016   Impacted cerumen, bilateral    Memory loss    Mild cognitive impairment of uncertain or unknown etiology    Mitral valve prolapse    Nocturia    Other abnormal blood chemistry 01/21/2010   Palpitations 08/05/2000   Pure hypercholesterolemia, unspecified    Right bundle branch block    Sacrococcygeal disorders, not elsewhere classified    Scoliosis    Senile osteoporosis 09/28/2001   Unilateral primary osteoarthritis, left hip    Unspecified dementia, unspecified severity, without behavioral disturbance, psychotic disturbance, mood disturbance, and anxiety (HCC)    Unspecified hearing loss, bilateral    Unsteadiness on feet    Vascular dementia, unspecified severity, without behavioral disturbance, psychotic disturbance, mood disturbance, and anxiety (HCC)    Past Surgical History:  Procedure Laterality Date   BREAST LUMPECTOMY Right 12/27/2007   LAPAROSCOPIC LYSIS OF ADHESIONS  07/15/2006   Dr. Michaell Cowing   OTHER SURGICAL HISTORY     TONSILLECTOMY  TUBAL LIGATION  1963    No Known Allergies  Outpatient Encounter Medications as of 12/22/2022  Medication Sig   acetaminophen (TYLENOL) 325 MG tablet Take 650 mg by mouth as needed for mild pain.   atenolol (TENORMIN)  25 MG tablet Take 1/2 tab daily   atenolol (TENORMIN) 25 MG tablet Take 12.5 mg by mouth daily as needed. Palpitations and/or HR >100   Carboxymethylcellulose Sod PF 0.25 % SOLN Apply 1 drop to eye 3 (three) times daily as needed. 1 drop in both eyes TID PRN   Cholecalciferol (VITAMIN D3) 25 MCG (1000 UT) CAPS Take 1 capsule (1,000 Units total) by mouth daily.   Emollient (EUCERIN) lotion Apply topically daily.   lidocaine 4 % Place 1 patch onto the skin daily as needed.   Olopatadine HCl 0.2 % SOLN Apply to eye.   omeprazole (PRILOSEC) 20 MG capsule Take 20 mg by mouth daily.   polyethylene glycol (MIRALAX / GLYCOLAX) 17 g packet Take 17 g by mouth daily.   sodium chloride (OCEAN) 0.65 % nasal spray Place 1 spray into the nose every 2 (two) hours as needed for congestion.   No facility-administered encounter medications on file as of 12/22/2022.    Review of Systems  Constitutional:  Negative for activity change, appetite change, chills, diaphoresis, fatigue, fever and unexpected weight change.  HENT:  Negative for congestion.   Respiratory:  Negative for cough, shortness of breath and wheezing.   Cardiovascular:  Positive for chest pain (this am resolved). Negative for palpitations and leg swelling.  Gastrointestinal:  Positive for constipation. Negative for abdominal distention, abdominal pain and diarrhea.  Genitourinary:  Negative for difficulty urinating and dysuria.  Musculoskeletal:  Negative for arthralgias, back pain, gait problem, joint swelling and myalgias.  Skin:        dry  Neurological:  Negative for dizziness, tremors, seizures, syncope, facial asymmetry, speech difficulty, weakness, light-headedness, numbness and headaches.  Psychiatric/Behavioral:  Negative for agitation, behavioral problems and confusion.        Memory loss    Immunization History  Administered Date(s) Administered   Fluad Quad(high Dose 65+) 10/08/2021   Fluad Trivalent(High Dose 65+) 10/28/2022    Influenza Split 10/23/2011   Influenza, High Dose Seasonal PF 09/20/2015, 10/09/2016, 10/15/2018   Influenza,inj,Quad PF,6+ Mos 10/28/2013, 10/30/2017   Influenza-Unspecified 10/06/2012, 10/26/2014   Moderna Covid-19 Fall Seasonal Vaccine 61yrs & older 10/28/2022   Moderna Sars-Covid-2 Vaccination 01/16/2019, 02/15/2019, 11/22/2019, 10/19/2020   Pfizer(Comirnaty)Fall Seasonal Vaccine 12 years and older 10/30/2021   Pneumococcal Conjugate-13 02/14/2014   Pneumococcal Polysaccharide-23 01/06/1997   Tdap 05/13/2017   Tetanus 05/13/2017   Varicella 01/14/2018   Zoster Recombinant(Shingrix) 10/09/2016, 09/17/2017, 01/14/2018   Zoster, Live 01/07/1999   Pertinent  Health Maintenance Due  Topic Date Due   INFLUENZA VACCINE  Completed   DEXA SCAN  Completed      05/19/2022    2:57 PM 06/30/2022    2:19 PM 07/07/2022    1:53 PM 09/23/2022   10:58 AM 12/22/2022    1:49 PM  Fall Risk  Falls in the past year? 1 1 1  0 0  Was there an injury with Fall? 1 1 1  0 0  Fall Risk Category Calculator 2 2 2  0 0  Patient at Risk for Falls Due to History of fall(s) History of fall(s) History of fall(s) No Fall Risks No Fall Risks  Fall risk Follow up Falls evaluation completed Falls evaluation completed Falls evaluation completed Falls evaluation completed Falls evaluation  completed   Functional Status Survey:    Vitals:   12/22/22 1346  BP: 130/72  Pulse: 64  Resp: 16  Temp: 97.7 F (36.5 C)  TempSrc: Temporal  SpO2: 94%  Weight: 121 lb (54.9 kg)  Height: 5\' 2"  (1.575 m)    Body mass index is 22.13 kg/m. Physical Exam Vitals and nursing note reviewed.  Constitutional:      General: She is not in acute distress.    Appearance: She is not diaphoretic.  HENT:     Head: Normocephalic and atraumatic.  Neck:     Vascular: No JVD.  Cardiovascular:     Rate and Rhythm: Normal rate and regular rhythm.     Heart sounds: No murmur heard.    Comments: Apical HR 60 Pulmonary:     Effort:  Pulmonary effort is normal. No respiratory distress.     Breath sounds: Normal breath sounds. No wheezing.  Abdominal:     General: Bowel sounds are normal. There is no distension.     Palpations: Abdomen is soft.     Tenderness: There is no abdominal tenderness.  Musculoskeletal:     Cervical back: No rigidity or tenderness.     Right lower leg: No edema.     Left lower leg: No edema.  Lymphadenopathy:     Cervical: No cervical adenopathy.  Skin:    General: Skin is warm and dry.  Neurological:     General: No focal deficit present.     Mental Status: She is alert. Mental status is at baseline.  Psychiatric:        Mood and Affect: Mood normal.     Labs reviewed: Recent Labs    01/08/22 1212 05/08/22 0000 06/11/22 0000  NA 135 132* 134*  K 4.5 4.5 4.2  CL 101 96* 98*  CO2 27 22 24*  GLUCOSE 109*  --   --   BUN 21 11 21   CREATININE 0.96 0.6 0.7  CALCIUM 9.9 9.3 9.0   Recent Labs    05/08/22 0000  AST 24  ALT 13  ALKPHOS 88  ALBUMIN 3.9   Recent Labs    01/08/22 1212 05/08/22 0000  WBC 7.8 6.9  NEUTROABS 4.8  --   HGB 14.1 14.0  HCT 42.2 41  MCV 97.5  --   PLT 279 404*   Lab Results  Component Value Date   TSH 1.52 05/08/2022   Lab Results  Component Value Date   HGBA1C 5.9 (H) 01/23/2016   Lab Results  Component Value Date   CHOL 369 (A) 08/28/2020   HDL 51 08/28/2020   LDLCALC 284 08/28/2020   TRIG 170 (A) 08/28/2020   CHOLHDL 3.5 01/27/2016    Significant Diagnostic Results in last 30 days:  No results found.  Assessment/Plan  1. PAF (paroxysmal atrial fibrillation) (HCC) (Primary) Continue atenolol Followed by cardiology  No DOAC per shared decision making in prior cardiology apt  2. Atypical chest pain Possibly due to anxiety Seems to go away with tylenol and emotional support Goals of care are comfort based and avoidance of aggressive measures.   3. Slow transit constipation Continue miralax   4. Dementia without  behavioral disturbance (HCC) Progressive decline in cognition and physical function c/w the disease. Still appropriate for assisted living but needs more reminders and cuing.   5. Senile osteoporosis On vitamin D Continues to walk daily in [place] NO further testing/tx due to age and goals of care   6. Hyperglycemia  Fasting glucose 123 in June Labs ordered  7. Xerosis of skin Eucerin daily to dry skin    Labs/tests ordered; BMP CBC in am  Apt: F/U with Dr Chales Abrahams in 3 months   Total time   time greater than 50% of total time spent doing pt counseling and coordination of care

## 2022-12-23 DIAGNOSIS — I482 Chronic atrial fibrillation, unspecified: Secondary | ICD-10-CM | POA: Diagnosis not present

## 2022-12-23 DIAGNOSIS — I10 Essential (primary) hypertension: Secondary | ICD-10-CM | POA: Diagnosis not present

## 2022-12-23 LAB — COMPREHENSIVE METABOLIC PANEL
Calcium: 9.4 (ref 8.7–10.7)
eGFR: 62

## 2022-12-23 LAB — BASIC METABOLIC PANEL
BUN: 26 — AB (ref 4–21)
CO2: 23 — AB (ref 13–22)
Chloride: 97 — AB (ref 99–108)
Creatinine: 0.9 (ref 0.5–1.1)
Glucose: 113
Potassium: 4.1 meq/L (ref 3.5–5.1)
Sodium: 131 — AB (ref 137–147)

## 2022-12-23 LAB — CBC AND DIFFERENTIAL
HCT: 41 (ref 36–46)
Hemoglobin: 13.8 (ref 12.0–16.0)
Platelets: 367 10*3/uL (ref 150–400)
WBC: 6.7

## 2022-12-23 LAB — CBC: RBC: 4.29 (ref 3.87–5.11)

## 2023-02-24 ENCOUNTER — Encounter: Payer: Self-pay | Admitting: Orthopedic Surgery

## 2023-03-17 DIAGNOSIS — M2011 Hallux valgus (acquired), right foot: Secondary | ICD-10-CM | POA: Diagnosis not present

## 2023-03-17 DIAGNOSIS — M2012 Hallux valgus (acquired), left foot: Secondary | ICD-10-CM | POA: Diagnosis not present

## 2023-03-17 DIAGNOSIS — M79671 Pain in right foot: Secondary | ICD-10-CM | POA: Diagnosis not present

## 2023-03-17 DIAGNOSIS — M79672 Pain in left foot: Secondary | ICD-10-CM | POA: Diagnosis not present

## 2023-03-17 DIAGNOSIS — L84 Corns and callosities: Secondary | ICD-10-CM | POA: Diagnosis not present

## 2023-03-17 DIAGNOSIS — L602 Onychogryphosis: Secondary | ICD-10-CM | POA: Diagnosis not present

## 2023-03-24 ENCOUNTER — Non-Acute Institutional Stay: Payer: Medicare Other | Admitting: Internal Medicine

## 2023-03-24 ENCOUNTER — Encounter: Payer: Self-pay | Admitting: Internal Medicine

## 2023-03-24 VITALS — BP 118/70 | HR 67 | Temp 97.6°F | Resp 20 | Ht 62.0 in | Wt 124.8 lb

## 2023-03-24 DIAGNOSIS — K5901 Slow transit constipation: Secondary | ICD-10-CM | POA: Diagnosis not present

## 2023-03-24 DIAGNOSIS — F0390 Unspecified dementia without behavioral disturbance: Secondary | ICD-10-CM | POA: Diagnosis not present

## 2023-03-24 DIAGNOSIS — I480 Paroxysmal atrial fibrillation: Secondary | ICD-10-CM

## 2023-03-24 DIAGNOSIS — R0789 Other chest pain: Secondary | ICD-10-CM | POA: Diagnosis not present

## 2023-03-24 DIAGNOSIS — M810 Age-related osteoporosis without current pathological fracture: Secondary | ICD-10-CM

## 2023-03-24 NOTE — Progress Notes (Signed)
 Location:  [facility] features editor of Service:  Clinic (12)  Provider:   Code Status: DNR Goals of Care:     03/24/2023    1:17 PM  Advanced Directives  Does Patient Have a Medical Advance Directive? Yes  Type of Advance Directive Living will;Healthcare Power of [place];Out of facility DNR (pink MOST or yellow form)  Does patient want to make changes to medical advance directive? No - Patient declined  Copy of Healthcare Power of Attorney in Chart? No - copy requested     Chief Complaint  Patient presents with   Follow-up    3 month follow up with labs.     HPI: Patient is a 89 y.o. female seen today for medical management of chronic diseases.    Lives in AL in [place]   Has h/o Hyponatremia, Palpitations/PAF, Osteoporosis, Hyperlipidemia, MCI   History of multiple right rib fractures MCI Atypical Chest pain Tylenol seems to help No Recent episodes  Walks without any assist No Falls Did not have any issues today Per Nurses no Behaviors Wt Readings from Last 3 Encounters:  03/24/23 124 lb 12.8 oz (56.6 kg)  12/22/22 121 lb (54.9 kg)  09/23/22 117 lb 3.7 oz (53.2 kg)       Past Medical History:  Diagnosis Date   Acute upper respiratory infections of unspecified site 06/09/2011   Aphasia    Breast cancer (HCC) 12/11/2008   Encounter for general adult medical examination without abnormal findings    Encounter for immunization    Fatigue 01/2012   Hypercholesterolemia    on lipitor   Hypertension    Hyponatremia 01/2016   Impacted cerumen, bilateral    Memory loss    Mild cognitive impairment of uncertain or unknown etiology    Mitral valve prolapse    Nocturia    Other abnormal blood chemistry 01/21/2010   Palpitations 08/05/2000   Pure hypercholesterolemia, unspecified    Right bundle branch block    Sacrococcygeal disorders, not elsewhere classified    Scoliosis    Senile osteoporosis 09/28/2001   Unilateral primary osteoarthritis,  left hip    Unspecified dementia, unspecified severity, without behavioral disturbance, psychotic disturbance, mood disturbance, and anxiety (HCC)    Unspecified hearing loss, bilateral    Unsteadiness on feet    Vascular dementia, unspecified severity, without behavioral disturbance, psychotic disturbance, mood disturbance, and anxiety (HCC)     Past Surgical History:  Procedure Laterality Date   BREAST LUMPECTOMY Right 12/27/2007   LAPAROSCOPIC LYSIS OF ADHESIONS  07/15/2006   Dr. Michaell Cowing   OTHER SURGICAL HISTORY     TONSILLECTOMY     TUBAL LIGATION  1963    No Known Allergies  Outpatient Encounter Medications as of 03/24/2023  Medication Sig   acetaminophen (TYLENOL) 325 MG tablet Take 650 mg by mouth as needed for mild pain.   alum & mag hydroxide-simeth (MAALOX PLUS) 400-400-40 MG/5ML suspension Take 5 mLs by mouth every 4 (four) hours as needed for indigestion. 30ml, oral, Every 4 hours- PRN, Maalox 30ml every 4 hours as needed for indigestion.   atenolol (TENORMIN) 25 MG tablet Take 1/2 tab daily   atenolol (TENORMIN) 25 MG tablet Take 12.5 mg by mouth daily as needed. Palpitations and/or HR >100   Carboxymethylcellulose Sod PF 0.25 % SOLN Apply 1 drop to eye 3 (three) times daily as needed. 1 drop in both eyes TID PRN   Cholecalciferol (VITAMIN D3) 25 MCG (1000 UT) CAPS Take 1 capsule (  1,000 Units total) by mouth daily.   Emollient (EUCERIN) lotion Apply topically daily.   lidocaine 4 % Place 1 patch onto the skin daily as needed.   Olopatadine HCl 0.2 % SOLN Apply to eye.   omeprazole (PRILOSEC) 20 MG capsule Take 20 mg by mouth daily.   polyethylene glycol (MIRALAX / GLYCOLAX) 17 g packet Take 17 g by mouth daily.   sodium chloride (OCEAN) 0.65 % nasal spray Place 1 spray into the nose every 2 (two) hours as needed for congestion.   No facility-administered encounter medications on file as of 03/24/2023.    Review of Systems:  Review of Systems  Unable to perform ROS:  Dementia    Health Maintenance  Topic Date Due   COVID-19 Vaccine (7 - 2024-25 season) 04/28/2023   Medicare Annual Wellness (AWV)  07/07/2023   DTaP/Tdap/Td (2 - Td or Tdap) 05/14/2027   Pneumonia Vaccine 20+ Years old  Completed   INFLUENZA VACCINE  Completed   DEXA SCAN  Completed   Zoster Vaccines- Shingrix  Completed   HPV VACCINES  Aged Out    Physical Exam: Vitals:   03/24/23 1310  BP: 118/70  Pulse: 67  Resp: 20  Temp: 97.6 F (36.4 C)  SpO2: 97%  Weight: 124 lb 12.8 oz (56.6 kg)  Height: 5\' 2"  (1.575 m)   Body mass index is 22.83 kg/m. Physical Exam Vitals reviewed.  Constitutional:      Appearance: Normal appearance.  HENT:     Head: Normocephalic.     Nose: Nose normal.     Mouth/Throat:     Mouth: Mucous membranes are moist.     Pharynx: Oropharynx is clear.  Eyes:     Pupils: Pupils are equal, round, and reactive to light.  Cardiovascular:     Rate and Rhythm: Normal rate. Rhythm irregular.     Pulses: Normal pulses.     Heart sounds: Normal heart sounds. No murmur heard. Pulmonary:     Effort: Pulmonary effort is normal.     Breath sounds: Normal breath sounds.  Abdominal:     General: Abdomen is flat. Bowel sounds are normal.     Palpations: Abdomen is soft.  Musculoskeletal:        General: No swelling.     Cervical back: Neck supple.  Skin:    General: Skin is warm.  Neurological:     General: No focal deficit present.     Mental Status: She is alert.  Psychiatric:        Mood and Affect: Mood normal.        Thought Content: Thought content normal.    Labs reviewed: Basic Metabolic Panel: Recent Labs    05/08/22 0000 06/11/22 0000  NA 132* 134*  K 4.5 4.2  CL 96* 98*  CO2 22 24*  BUN 11 21  CREATININE 0.6 0.7  CALCIUM 9.3 9.0  TSH 1.52  --    Liver Function Tests: Recent Labs    05/08/22 0000  AST 24  ALT 13  ALKPHOS 88  ALBUMIN 3.9   No results for input(s): "LIPASE", "AMYLASE" in the last 8760 hours. No  results for input(s): "AMMONIA" in the last 8760 hours. CBC: Recent Labs    05/08/22 0000 12/23/22 0000  WBC 6.9 6.7  HGB 14.0 13.8  HCT 41 41  PLT 404* 367   Lipid Panel: No results for input(s): "CHOL", "HDL", "LDLCALC", "TRIG", "CHOLHDL", "LDLDIRECT" in the last 8760 hours. Lab Results  Component  Value Date   HGBA1C 5.9 (H) 01/23/2016    Procedures since last visit: No results found.  Assessment/Plan 1. PAF (paroxysmal atrial fibrillation) (HCC) (Primary) No DOAC due to fall risk and Goals of care Also on Tenormin 2. Atypical chest pain No Recent episodes Tylenol seems to be help Dr Annabell Howells does not want extensive workup due to her age and dementia  Also Continue on Prilosec 3. Nasal Congestion Saline Spray seems to be working  4. Dementia without behavioral disturbance (HCC) Recent MMSE 23/30 Failed Clock Drawing Doing well in AL 5 Hyponatremia Repeat Labs in 3 months   Labs/tests ordered:  CMP,CBC,TSH before next visit Next appt:  Visit date not found

## 2023-07-02 DIAGNOSIS — G309 Alzheimer's disease, unspecified: Secondary | ICD-10-CM | POA: Diagnosis not present

## 2023-07-02 LAB — BASIC METABOLIC PANEL WITH GFR
BUN: 21 (ref 4–21)
CO2: 22 (ref 13–22)
Chloride: 102 (ref 99–108)
Creatinine: 0.8 (ref 0.5–1.1)
Glucose: 105
Potassium: 4.3 meq/L (ref 3.5–5.1)
Sodium: 138 (ref 137–147)

## 2023-07-02 LAB — CBC: RBC: 4.24 (ref 3.87–5.11)

## 2023-07-02 LAB — COMPREHENSIVE METABOLIC PANEL WITH GFR
Albumin: 3.7 (ref 3.5–5.0)
Calcium: 9.4 (ref 8.7–10.7)
Globulin: 2.3
eGFR: 71

## 2023-07-02 LAB — TSH: TSH: 1.71 (ref 0.41–5.90)

## 2023-07-02 LAB — HEPATIC FUNCTION PANEL
ALT: 12 U/L (ref 7–35)
AST: 18 (ref 13–35)
Alkaline Phosphatase: 97 (ref 25–125)
Bilirubin, Total: 0.3

## 2023-07-02 LAB — CBC AND DIFFERENTIAL
HCT: 40 (ref 36–46)
Hemoglobin: 13.5 (ref 12.0–16.0)
Platelets: 334 10*3/uL (ref 150–400)
WBC: 6.3

## 2023-07-06 ENCOUNTER — Non-Acute Institutional Stay: Admitting: Adult Health

## 2023-07-06 ENCOUNTER — Encounter: Payer: Self-pay | Admitting: Adult Health

## 2023-07-06 VITALS — BP 116/70 | HR 65 | Temp 98.0°F | Ht 62.0 in | Wt 128.8 lb

## 2023-07-06 DIAGNOSIS — E871 Hypo-osmolality and hyponatremia: Secondary | ICD-10-CM

## 2023-07-06 DIAGNOSIS — F03B0 Unspecified dementia, moderate, without behavioral disturbance, psychotic disturbance, mood disturbance, and anxiety: Secondary | ICD-10-CM | POA: Diagnosis not present

## 2023-07-06 DIAGNOSIS — K219 Gastro-esophageal reflux disease without esophagitis: Secondary | ICD-10-CM | POA: Diagnosis not present

## 2023-07-06 DIAGNOSIS — I480 Paroxysmal atrial fibrillation: Secondary | ICD-10-CM | POA: Diagnosis not present

## 2023-07-06 DIAGNOSIS — R079 Chest pain, unspecified: Secondary | ICD-10-CM

## 2023-07-06 DIAGNOSIS — I10 Essential (primary) hypertension: Secondary | ICD-10-CM

## 2023-07-06 NOTE — Progress Notes (Signed)
 Location:  [facility]  POS: Clinic  Provider: Tawni America, ANP  Code Status: DNR Goals of Care:     03/24/2023    1:17 PM  Advanced Directives  Does Patient Have a Medical Advance Directive? Yes  Type of Advance Directive Living will;Healthcare Power of [place];Out of facility DNR (pink MOST or yellow form)  Does patient want to make changes to medical advance directive? No - Patient declined  Copy of Healthcare Power of Attorney in Chart? No - copy requested     Chief Complaint  Patient presents with   Follow-up    3 month follow up    HPI: Discussed the use of AI scribe software for clinical note transcription with the patient, who gave verbal consent to proceed.  History of Present Illness   Cynthia Watkins is a 89 year old female who presents for a routine checkup.  She has a history of atrial fibrillation and currently experiences no palpitations or shortness of breath. She sleeps well at night, though she sometimes wakes up, which she considers normal for her age. Not on DOAC per cardiology.   She takes Miralax  for constipation and currently has no symptoms of constipation.  She was previously prescribed Prilosec for chest pain and indigestion, and she currently has no symptoms of chest pain, indigestion, or rib pain.  She experiences occasional nasal congestion, which has been an issue in the past, but she does not have any significant concerns at this time.  Regarding her memory, she acknowledges some forgetfulness, attributing it to her age, but does not express any significant concerns. She is able to dress and bathe independently.  Fasting glucose 105 07/02/23  MMSE 24/05 August 2022      Reports from the nurse in assisted living at [facility]: independent  dressing but needs cuing and reminders to change clothes and take baths.  Spends a lot of time sleeping in the recliner and needs to be woken up frequently throughout the day.  Past Medical History:   Diagnosis Date   Acute upper respiratory infections of unspecified site 06/09/2011   Aphasia    Breast cancer (HCC) 12/11/2008   Encounter for general adult medical examination without abnormal findings    Encounter for immunization    Fatigue 01/2012   Hypercholesterolemia    on lipitor   Hypertension    Hyponatremia 01/2016   Impacted cerumen, bilateral    Memory loss    Mild cognitive impairment of uncertain or unknown etiology    Mitral valve prolapse    Nocturia    Other abnormal blood chemistry 01/21/2010   Palpitations 08/05/2000   Pure hypercholesterolemia, unspecified    Right bundle branch block    Sacrococcygeal disorders, not elsewhere classified    Scoliosis    Senile osteoporosis 09/28/2001   Unilateral primary osteoarthritis, left hip    Unspecified dementia, unspecified severity, without behavioral disturbance, psychotic disturbance, mood disturbance, and anxiety (HCC)    Unspecified hearing loss, bilateral    Unsteadiness on feet    Vascular dementia, unspecified severity, without behavioral disturbance, psychotic disturbance, mood disturbance, and anxiety (HCC)     Past Surgical History:  Procedure Laterality Date   BREAST LUMPECTOMY Right 12/27/2007   LAPAROSCOPIC LYSIS OF ADHESIONS  07/15/2006   Dr. Sheldon   OTHER SURGICAL HISTORY     TONSILLECTOMY     TUBAL LIGATION  1963    No Known Allergies  Outpatient Encounter Medications as of 07/06/2023  Medication Sig   acetaminophen  (  TYLENOL ) 325 MG tablet Take 650 mg by mouth as needed for mild pain.   alum & mag hydroxide-simeth (MAALOX PLUS) 400-400-40 MG/5ML suspension Take 5 mLs by mouth every 4 (four) hours as needed for indigestion. 30ml, oral, Every 4 hours- PRN, Maalox 30ml every 4 hours as needed for indigestion.   atenolol  (TENORMIN ) 25 MG tablet Take 1/2 tab daily   atenolol  (TENORMIN ) 25 MG tablet Take 12.5 mg by mouth daily as needed. Palpitations and/or HR >100   Carboxymethylcellulose Sod PF  0.25 % SOLN Apply 1 drop to eye 3 (three) times daily as needed. 1 drop in both eyes TID PRN   Cholecalciferol (VITAMIN D3) 25 MCG (1000 UT) CAPS Take 1 capsule (1,000 Units total) by mouth daily.   Emollient (EUCERIN) lotion Apply topically daily.   famotidine (PEPCID) 20 MG tablet Take 20 mg by mouth as needed for heartburn or indigestion.   lidocaine 4 % Place 1 patch onto the skin daily as needed.   Olopatadine HCl 0.2 % SOLN Apply to eye.   omeprazole (PRILOSEC) 20 MG capsule Take 20 mg by mouth daily.   ondansetron  (ZOFRAN ) 4 MG tablet Take 4 mg by mouth every 6 (six) hours as needed for nausea or vomiting.   polyethylene glycol (MIRALAX  / GLYCOLAX ) 17 g packet Take 17 g by mouth daily.   sodium chloride  (OCEAN) 0.65 % nasal spray Place 1 spray into the nose every 2 (two) hours as needed for congestion.   No facility-administered encounter medications on file as of 07/06/2023.    Review of Systems:  Review of Systems  Constitutional:  Negative for activity change, appetite change, chills, diaphoresis, fatigue, fever and unexpected weight change.  HENT:  Positive for congestion.   Respiratory:  Negative for cough, shortness of breath and wheezing.   Cardiovascular:  Negative for chest pain, palpitations and leg swelling.  Gastrointestinal:  Negative for abdominal distention, abdominal pain, constipation and diarrhea.  Genitourinary:  Negative for difficulty urinating and dysuria.  Musculoskeletal:  Negative for arthralgias, back pain, gait problem, joint swelling and myalgias.  Neurological:  Negative for dizziness, tremors, seizures, syncope, facial asymmetry, speech difficulty, weakness, light-headedness, numbness and headaches.  Psychiatric/Behavioral:  Positive for confusion. Negative for agitation and behavioral problems.     Health Maintenance  Topic Date Due   COVID-19 Vaccine (7 - 2024-25 season) 04/28/2023   Medicare Annual Wellness (AWV)  07/07/2023   INFLUENZA VACCINE   08/07/2023   DTaP/Tdap/Td (2 - Td or Tdap) 05/14/2027   Pneumococcal Vaccine: 50+ Years  Completed   DEXA SCAN  Completed   Zoster Vaccines- Shingrix  Completed   Hepatitis B Vaccines  Aged Out   HPV VACCINES  Aged Out   Meningococcal B Vaccine  Aged Out    Physical Exam: Vitals:   07/06/23 0859  BP: 116/70  Pulse: 65  Temp: 98 F (36.7 C)  SpO2: 95%  Weight: 128 lb 12.8 oz (58.4 kg)  Height: 5' 2 (1.575 m)   Body mass index is 23.56 kg/m. Physical Exam Vitals and nursing note reviewed.  Constitutional:      General: She is not in acute distress.    Appearance: She is not diaphoretic.  HENT:     Head: Normocephalic and atraumatic.     Right Ear: Tympanic membrane normal.     Left Ear: Tympanic membrane normal.     Nose: Nose normal.     Mouth/Throat:     Mouth: Mucous membranes are moist.  Pharynx: Oropharynx is clear.   Eyes:     Conjunctiva/sclera: Conjunctivae normal.     Pupils: Pupils are equal, round, and reactive to light.   Neck:     Vascular: No JVD.   Cardiovascular:     Rate and Rhythm: Normal rate and regular rhythm.     Heart sounds: No murmur heard. Pulmonary:     Effort: Pulmonary effort is normal. No respiratory distress.     Breath sounds: Normal breath sounds. No wheezing.  Abdominal:     General: Abdomen is flat. There is no distension.     Palpations: Abdomen is soft.     Tenderness: There is no abdominal tenderness.   Musculoskeletal:     Cervical back: No rigidity or tenderness.     Right lower leg: No edema.     Left lower leg: No edema.  Lymphadenopathy:     Cervical: No cervical adenopathy.   Skin:    General: Skin is warm and dry.   Neurological:     General: No focal deficit present.     Mental Status: She is alert. Mental status is at baseline.   Psychiatric:        Mood and Affect: Mood normal.     Labs reviewed: Basic Metabolic Panel: Recent Labs    12/23/22 0000 07/02/23 0000  NA 131* 138  K 4.1 4.3   CL 97* 102  CO2 23* 22  BUN 26* 21  CREATININE 0.9 0.8  CALCIUM 9.4 9.4  TSH  --  1.71   Liver Function Tests: Recent Labs    07/02/23 0000  AST 18  ALT 12  ALKPHOS 97  ALBUMIN 3.7   No results for input(s): LIPASE, AMYLASE in the last 8760 hours. No results for input(s): AMMONIA in the last 8760 hours. CBC: Recent Labs    12/23/22 0000 07/02/23 0000  WBC 6.7 6.3  HGB 13.8 13.5  HCT 41 40  PLT 367 334   Lipid Panel: No results for input(s): CHOL, HDL, LDLCALC, TRIG, CHOLHDL, LDLDIRECT in the last 8760 hours. Lab Results  Component Value Date   HGBA1C 5.9 (H) 01/23/2016    Procedures since last visit: No results found.   Assessment and Plan    Atrial Fibrillation Condition well-managed, asymptomatic.  Gastroesophageal Reflux Disease (GERD) Asymptomatic on omeprazole.  Constipation Managed with polyethylene glycol, asymptomatic.  Nasal Congestion Occasional congestion due to irritation and age-related nasal drip.  Hyponatremia Sodium levels normalized at 138 mmol/L.  General Health Maintenance No routine screenings or vaccinations due  - Recommend COVID-19 and influenza vaccines in the fall.  Follow-up Overall well-being maintained. - Schedule follow-up with Dr. Charlanne in three months.     Dementia without behavioral disturbance CT of the head 01/08/22 showed mild chronic microvascular ischemic change.  Progressing over time but remains appropriate for AL No behavioral concerns at this time.      Labs/tests ordered:  * No order type specified *  BMP prior to next apt  Next appt:  10/06/2023   Total time :  time greater than 50% of total time spent doing pt counseling and coordination of care

## 2023-10-02 LAB — COMPREHENSIVE METABOLIC PANEL WITH GFR
Calcium: 9.1 (ref 8.7–10.7)
eGFR: 58

## 2023-10-02 LAB — BASIC METABOLIC PANEL WITH GFR
BUN: 22 — AB (ref 4–21)
CO2: 21 (ref 13–22)
Chloride: 100 (ref 99–108)
Creatinine: 0.9 (ref 0.5–1.1)
Glucose: 90
Potassium: 4.4 meq/L (ref 3.5–5.1)
Sodium: 134 — AB (ref 137–147)

## 2023-10-06 ENCOUNTER — Encounter: Payer: Self-pay | Admitting: Internal Medicine

## 2023-10-06 ENCOUNTER — Non-Acute Institutional Stay: Admitting: Internal Medicine

## 2023-10-06 VITALS — BP 120/100 | HR 69 | Temp 97.7°F | Ht 62.0 in | Wt 126.0 lb

## 2023-10-06 DIAGNOSIS — R0789 Other chest pain: Secondary | ICD-10-CM

## 2023-10-06 DIAGNOSIS — E871 Hypo-osmolality and hyponatremia: Secondary | ICD-10-CM

## 2023-10-06 DIAGNOSIS — G3184 Mild cognitive impairment, so stated: Secondary | ICD-10-CM | POA: Diagnosis not present

## 2023-10-06 DIAGNOSIS — I10 Essential (primary) hypertension: Secondary | ICD-10-CM

## 2023-10-06 NOTE — Progress Notes (Signed)
 Location:  [facility] features editor of Service:  Clinic (12)  Provider:   Code Status: DNR Goals of Care:     03/24/2023    1:17 PM  Advanced Directives  Does Patient Have a Medical Advance Directive? Yes  Type of Advance Directive Living will;Healthcare Power of [place];Out of facility DNR (pink MOST or yellow form)  Does patient want to make changes to medical advance directive? No - Patient declined  Copy of Healthcare Power of Attorney in Chart? No - copy requested     Chief Complaint  Patient presents with   Follow-up    3 month follow up    HPI: Patient is a 89 y.o. female seen today for medical management of chronic diseases.    Lives in [place] in [place]   Has h/o Hyponatremia, Palpitations/PAF, Osteoporosis, Hyperlipidemia, MCI  Recent MMSE 02/2023 23/30 Failed her Clock History of multiple right rib fractures MCI Atypical Chest pain  Walks with no assist. NO recent falls No Behaviors More incontinent of  her Bowels and Bladder Need help with cleaning Wt Readings from Last 3 Encounters:  07/06/23 128 lb 12.8 oz (58.4 kg)  03/24/23 124 lb 12.8 oz (56.6 kg)  12/22/22 121 lb (54.9 kg)    Past Medical History:  Diagnosis Date   Acute upper respiratory infections of unspecified site 06/09/2011   Aphasia    Breast cancer (HCC) 12/11/2008   Encounter for general adult medical examination without abnormal findings    Encounter for immunization    Fatigue 01/2012   Hypercholesterolemia    on lipitor   Hypertension    Hyponatremia 01/2016   Impacted cerumen, bilateral    Memory loss    Mild cognitive impairment of uncertain or unknown etiology    Mitral valve prolapse    Nocturia    Other abnormal blood chemistry 01/21/2010   Palpitations 08/05/2000   Pure hypercholesterolemia, unspecified    Right bundle branch block    Sacrococcygeal disorders, not elsewhere classified    Scoliosis    Senile osteoporosis 09/28/2001   Unilateral primary  osteoarthritis, left hip    Unspecified dementia, unspecified severity, without behavioral disturbance, psychotic disturbance, mood disturbance, and anxiety (HCC)    Unspecified hearing loss, bilateral    Unsteadiness on feet    Vascular dementia, unspecified severity, without behavioral disturbance, psychotic disturbance, mood disturbance, and anxiety (HCC)     Past Surgical History:  Procedure Laterality Date   BREAST LUMPECTOMY Right 12/27/2007   LAPAROSCOPIC LYSIS OF ADHESIONS  07/15/2006   Dr. Sheldon   OTHER SURGICAL HISTORY     TONSILLECTOMY     TUBAL LIGATION  1963    No Known Allergies  Outpatient Encounter Medications as of 10/06/2023  Medication Sig   acetaminophen  (TYLENOL ) 325 MG tablet Take 650 mg by mouth as needed for mild pain.   alum & mag hydroxide-simeth (MAALOX PLUS) 400-400-40 MG/5ML suspension Take 5 mLs by mouth every 4 (four) hours as needed for indigestion. 30ml, oral, Every 4 hours- PRN, Maalox 30ml every 4 hours as needed for indigestion.   atenolol  (TENORMIN ) 25 MG tablet Take 1/2 tab daily   atenolol  (TENORMIN ) 25 MG tablet Take 12.5 mg by mouth daily as needed. Palpitations and/or HR >100   Carboxymethylcellulose Sod PF 0.25 % SOLN Apply 1 drop to eye 3 (three) times daily as needed. 1 drop in both eyes TID PRN   CHLORHEXIDINE GLUCONATE, BULK, SOLN by Does not apply route as directed. 1 tsp  dental at bedtime for mouth hygiene Brush on 1 tsp of solution to teeth and gums with a toothbrush after PM mouthcare. Spit out excess and do not rinse   Cholecalciferol (VITAMIN D3) 25 MCG (1000 UT) CAPS Take 1 capsule (1,000 Units total) by mouth daily.   Emollient (EUCERIN) lotion Apply topically daily.   famotidine (PEPCID) 20 MG tablet Take 20 mg by mouth as needed for heartburn or indigestion.   lidocaine 4 % Place 1 patch onto the skin daily as needed.   Olopatadine HCl 0.2 % SOLN Apply to eye.   omeprazole (PRILOSEC) 20 MG capsule Take 20 mg by mouth daily.    ondansetron  (ZOFRAN ) 4 MG tablet Take 4 mg by mouth every 6 (six) hours as needed for nausea or vomiting.   polyethylene glycol (MIRALAX  / GLYCOLAX ) 17 g packet Take 17 g by mouth daily.   sodium chloride  (OCEAN) 0.65 % nasal spray Place 1 spray into the nose every 2 (two) hours as needed for congestion.   No facility-administered encounter medications on file as of 10/06/2023.    Review of Systems:  Review of Systems  Constitutional:  Negative for activity change and appetite change.  HENT: Negative.    Respiratory:  Negative for cough and shortness of breath.   Cardiovascular:  Negative for leg swelling.  Gastrointestinal:  Negative for constipation.  Genitourinary: Negative.   Musculoskeletal:  Negative for arthralgias, gait problem and myalgias.  Skin: Negative.   Neurological:  Negative for dizziness and weakness.  Psychiatric/Behavioral:  Positive for confusion. Negative for dysphoric mood and sleep disturbance.     Health Maintenance  Topic Date Due   Medicare Annual Wellness (AWV)  07/07/2023   Influenza Vaccine  08/07/2023   COVID-19 Vaccine (7 - 2024-25 season) 09/07/2023   DTaP/Tdap/Td (2 - Td or Tdap) 05/14/2027   Pneumococcal Vaccine: 50+ Years  Completed   DEXA SCAN  Completed   Zoster Vaccines- Shingrix  Completed   HPV VACCINES  Aged Out   Meningococcal B Vaccine  Aged Out    Physical Exam: Vitals:   10/06/23 0837  Height: 5' 2 (1.575 m)   Body mass index is 23.56 kg/m. Physical Exam Vitals reviewed.  Constitutional:      Appearance: Normal appearance.  HENT:     Head: Normocephalic.     Nose: Nose normal.     Mouth/Throat:     Mouth: Mucous membranes are moist.     Pharynx: Oropharynx is clear.  Eyes:     Pupils: Pupils are equal, round, and reactive to light.  Cardiovascular:     Rate and Rhythm: Normal rate. Rhythm irregular.     Pulses: Normal pulses.     Heart sounds: Normal heart sounds. No murmur heard. Pulmonary:     Effort: Pulmonary  effort is normal.     Breath sounds: Normal breath sounds.  Abdominal:     General: Abdomen is flat. Bowel sounds are normal.     Palpations: Abdomen is soft.  Musculoskeletal:        General: No swelling.     Cervical back: Neck supple.  Skin:    General: Skin is warm.  Neurological:     General: No focal deficit present.     Mental Status: She is alert.  Psychiatric:        Mood and Affect: Mood normal.        Thought Content: Thought content normal.     Labs reviewed: Basic Metabolic Panel: Recent Labs  12/23/22 0000 07/02/23 0000 10/02/23 0000  NA 131* 138 134*  K 4.1 4.3 4.4  CL 97* 102 100  CO2 23* 22 21  BUN 26* 21 22*  CREATININE 0.9 0.8 0.9  CALCIUM 9.4 9.4 9.1  TSH  --  1.71  --    Liver Function Tests: Recent Labs    07/02/23 0000  AST 18  ALT 12  ALKPHOS 97  ALBUMIN 3.7   No results for input(s): LIPASE, AMYLASE in the last 8760 hours. No results for input(s): AMMONIA in the last 8760 hours. CBC: Recent Labs    12/23/22 0000 07/02/23 0000  WBC 6.7 6.3  HGB 13.8 13.5  HCT 41 40  PLT 367 334   Lipid Panel: No results for input(s): CHOL, HDL, LDLCALC, TRIG, CHOLHDL, LDLDIRECT in the last 8760 hours. Lab Results  Component Value Date   HGBA1C 5.9 (H) 01/23/2016    Procedures since last visit: No results found.  Assessment/Plan 1. Hyponatremia (Primary) Sodium low but stable  2. Essential hypertension, benign Good Control in AL Slightly elevated here but all her other readings are good On Low dose fo Tenormin   3. MCI (mild cognitive impairment) Able to manage in AL Needing more Bowel and Bladder care MMSE 23/30 4. Atypical chest pain No Recent episodes Tylenol  seems to be help Dr Watt does not want extensive workup due to her age and dementia  Also Continue on Prilosec 5 PAF (paroxysmal atrial fibrillation) (HCC) (Primary) No DOAC due to fall risk and Goals of care Also on Tenormin   Labs/tests ordered:   No Labs Next appt:  Visit date not found

## 2024-01-04 ENCOUNTER — Encounter: Payer: Self-pay | Admitting: Adult Health

## 2024-01-11 ENCOUNTER — Encounter: Payer: Self-pay | Admitting: Adult Health

## 2024-01-11 ENCOUNTER — Non-Acute Institutional Stay: Admitting: Adult Health

## 2024-01-11 VITALS — BP 120/80 | HR 63 | Temp 98.0°F | Ht 62.0 in | Wt 123.2 lb

## 2024-01-11 DIAGNOSIS — F03B0 Unspecified dementia, moderate, without behavioral disturbance, psychotic disturbance, mood disturbance, and anxiety: Secondary | ICD-10-CM

## 2024-01-11 DIAGNOSIS — I480 Paroxysmal atrial fibrillation: Secondary | ICD-10-CM | POA: Diagnosis not present

## 2024-01-11 DIAGNOSIS — K5901 Slow transit constipation: Secondary | ICD-10-CM

## 2024-01-11 DIAGNOSIS — K219 Gastro-esophageal reflux disease without esophagitis: Secondary | ICD-10-CM | POA: Diagnosis not present

## 2024-01-11 DIAGNOSIS — E871 Hypo-osmolality and hyponatremia: Secondary | ICD-10-CM | POA: Diagnosis not present

## 2024-01-11 DIAGNOSIS — R079 Chest pain, unspecified: Secondary | ICD-10-CM

## 2024-01-11 NOTE — Progress Notes (Unsigned)
 "  Location:  [facility]  POS: Clinic  Provider: Tawni America, ANP  Code Status: DNR Goals of Care:     03/24/2023    1:17 PM  Advanced Directives  Does Patient Have a Medical Advance Directive? Yes  Type of Advance Directive Living will;Healthcare Power of [place];Out of facility DNR (pink MOST or yellow form)  Does patient want to make changes to medical advance directive? No - Patient declined  Copy of Healthcare Power of Attorney in Chart? No - copy requested     Chief Complaint  Patient presents with   Follow-up    3 month follow up    HPI:  History of Present Illness Cynthia Watkins is a 89 year old female who presents for a three-month follow-up visit.  Vascular dementia  MMSE 23/30 02/23/23 needs repeated testing [facility] [place] and ambulatory resides in [place]  Cardiac arrhythmia - Atrial fibrillation treated with atenolol  - No palpitations - No chest pain  Weight - Weight decreased from 128 lb in June to 123 lb currently - No difficulty swallowing  Gastrointestinal symptoms - Takes Miralax  - No bowel movement problems  Gastroesophageal symptoms - Takes Prilosec - No chest pain, reflux, or indigestion  Hx of hyponatremia  - Sodium was 134 in September 2025  Dermatologic lesion - Cheek lesion treated with cryotherapy for actinic keratosis by dermatologist - No issues with the treatment site     Past Medical History:  Diagnosis Date   Acute upper respiratory infections of unspecified site 06/09/2011   Aphasia    Breast cancer (HCC) 12/11/2008   Encounter for general adult medical examination without abnormal findings    Encounter for immunization    Fatigue 01/2012   Hypercholesterolemia    on lipitor   Hypertension    Hyponatremia 01/2016   Impacted cerumen, bilateral    Memory loss    Mild cognitive impairment of uncertain or unknown etiology    Mitral valve prolapse    Nocturia    Other abnormal blood chemistry 01/21/2010    Palpitations 08/05/2000   Pure hypercholesterolemia, unspecified    Right bundle branch block    Sacrococcygeal disorders, not elsewhere classified    Scoliosis    Senile osteoporosis 09/28/2001   Unilateral primary osteoarthritis, left hip    Unspecified dementia, unspecified severity, without behavioral disturbance, psychotic disturbance, mood disturbance, and anxiety (HCC)    Unspecified hearing loss, bilateral    Unsteadiness on feet    Vascular dementia, unspecified severity, without behavioral disturbance, psychotic disturbance, mood disturbance, and anxiety (HCC)     Past Surgical History:  Procedure Laterality Date   BREAST LUMPECTOMY Right 12/27/2007   LAPAROSCOPIC LYSIS OF ADHESIONS  07/15/2006   Dr. Sheldon   OTHER SURGICAL HISTORY     TONSILLECTOMY     TUBAL LIGATION  1963    Allergies[1]  Outpatient Encounter Medications as of 01/11/2024  Medication Sig   acetaminophen  (TYLENOL ) 325 MG tablet Take 650 mg by mouth as needed for mild pain.   alum & mag hydroxide-simeth (MAALOX PLUS) 400-400-40 MG/5ML suspension Take 5 mLs by mouth every 4 (four) hours as needed for indigestion. 30ml, oral, Every 4 hours- PRN, Maalox 30ml every 4 hours as needed for indigestion.   atenolol  (TENORMIN ) 25 MG tablet Take 1/2 tab daily   atenolol  (TENORMIN ) 25 MG tablet Take 12.5 mg by mouth daily as needed. Palpitations and/or HR >100   Carboxymethylcellulose Sod PF 0.25 % SOLN Apply 1 drop to eye 3 (three) times  daily as needed. 1 drop in both eyes TID PRN   CHLORHEXIDINE GLUCONATE, BULK, SOLN by Does not apply route as directed. 1 tsp dental at bedtime for mouth hygiene Brush on 1 tsp of solution to teeth and gums with a toothbrush after PM mouthcare. Spit out excess and do not rinse   Cholecalciferol (VITAMIN D3) 25 MCG (1000 UT) CAPS Take 1 capsule (1,000 Units total) by mouth daily.   Emollient (EUCERIN) lotion Apply topically daily.   famotidine (PEPCID) 20 MG tablet Take 20 mg by mouth as  needed for heartburn or indigestion.   lidocaine 4 % Place 1 patch onto the skin daily as needed.   Olopatadine HCl 0.2 % SOLN Apply to eye.   omeprazole (PRILOSEC) 20 MG capsule Take 20 mg by mouth daily.   ondansetron  (ZOFRAN ) 4 MG tablet Take 4 mg by mouth every 6 (six) hours as needed for nausea or vomiting.   polyethylene glycol (MIRALAX  / GLYCOLAX ) 17 g packet Take 17 g by mouth daily.   sodium chloride  (OCEAN) 0.65 % nasal spray Place 1 spray into the nose every 2 (two) hours as needed for congestion.   No facility-administered encounter medications on file as of 01/11/2024.    Review of Systems:  Review of Systems  Constitutional:  Negative for activity change, appetite change, chills, diaphoresis, fatigue and fever.  HENT:  Negative for congestion.   Respiratory:  Negative for cough, shortness of breath and wheezing.   Cardiovascular:  Negative for chest pain and leg swelling.  Gastrointestinal:  Negative for abdominal distention, abdominal pain, constipation, diarrhea, nausea and vomiting.  Genitourinary:  Negative for difficulty urinating, dysuria and urgency.  Musculoskeletal:  Negative for back pain, gait problem, myalgias and neck pain.  Skin:  Negative for rash.  Neurological:  Negative for dizziness and weakness.  Psychiatric/Behavioral:  Negative for confusion.     Health Maintenance  Topic Date Due   Medicare Annual Wellness (AWV)  07/07/2023   COVID-19 Vaccine (8 - 2025-26 season) 04/04/2024   DTaP/Tdap/Td (2 - Td or Tdap) 05/14/2027   Pneumococcal Vaccine: 50+ Years  Completed   Influenza Vaccine  Completed   Bone Density Scan  Completed   Zoster Vaccines- Shingrix  Completed   Meningococcal B Vaccine  Aged Out    Physical Exam: Vitals:   01/11/24 0821  BP: 120/80  Pulse: 63  Temp: 98 F (36.7 C)  SpO2: 96%  Weight: 123 lb 3.2 oz (55.9 kg)  Height: 5' 2 (1.575 m)   Body mass index is 22.53 kg/m. Wt Readings from Last 3 Encounters:  01/11/24 123 lb  3.2 oz (55.9 kg)  10/06/23 126 lb (57.2 kg)  07/06/23 128 lb 12.8 oz (58.4 kg)    Physical Exam Vitals and nursing note reviewed.  Constitutional:      General: She is not in acute distress.    Appearance: She is not diaphoretic.  HENT:     Head: Normocephalic and atraumatic.     Right Ear: Tympanic membrane normal.     Left Ear: Tympanic membrane normal.     Nose: Nose normal.     Mouth/Throat:     Mouth: Mucous membranes are moist.     Pharynx: Oropharynx is clear.  Neck:     Vascular: No JVD.  Cardiovascular:     Rate and Rhythm: Normal rate and regular rhythm.     Heart sounds: No murmur heard. Pulmonary:     Effort: Pulmonary effort is normal. No respiratory  distress.     Breath sounds: Normal breath sounds. No wheezing.  Abdominal:     General: Abdomen is flat. Bowel sounds are normal.     Palpations: Abdomen is soft.  Musculoskeletal:     Right lower leg: No edema.     Left lower leg: No edema.  Skin:    General: Skin is warm and dry.  Neurological:     General: No focal deficit present.     Mental Status: She is alert. Mental status is at baseline.  Psychiatric:        Mood and Affect: Mood normal.     Labs reviewed: Basic Metabolic Panel: Recent Labs    07/02/23 0000 10/02/23 0000  NA 138 134*  K 4.3 4.4  CL 102 100  CO2 22 21  BUN 21 22*  CREATININE 0.8 0.9  CALCIUM 9.4 9.1  TSH 1.71  --    Liver Function Tests: Recent Labs    07/02/23 0000  AST 18  ALT 12  ALKPHOS 97  ALBUMIN 3.7   No results for input(s): LIPASE, AMYLASE in the last 8760 hours. No results for input(s): AMMONIA in the last 8760 hours. CBC: Recent Labs    07/02/23 0000  WBC 6.3  HGB 13.5  HCT 40  PLT 334   Lipid Panel: No results for input(s): CHOL, HDL, LDLCALC, TRIG, CHOLHDL, LDLDIRECT in the last 8760 hours. Lab Results  Component Value Date   HGBA1C 5.9 (H) 01/23/2016    Procedures since last visit: No results  found.  Assessment/Plan Assessment and Plan Assessment & Plan Atrial fibrillation On atenolol . - Continue atenolol  for heart rate control. -not on doac per cardiology due age/risk  Constipation Managed with Miralax . - Continue Miralax  for bowel movement regulation.  Gastroesophageal reflux disease Managed with Prilosec. - Continue Prilosec for GERD management.  Hyponatremia Previous sodium level of 134 in September. - Monitor sodium levels during routine blood work.  Actinic keratosis Treated with cryotherapy.  General Health Maintenance No vaccinations due. - Check vitamin D  level during next blood work.      Labs/tests ordered:  * No order type specified *Vitamin d  CBC BMP prior to next apt Next appt: 3 months   Total time :  time greater than 50% of total time spent doing pt counseling and coordination of care         [1] No Known Allergies  "

## 2024-01-13 DIAGNOSIS — K5900 Constipation, unspecified: Secondary | ICD-10-CM | POA: Insufficient documentation

## 2024-01-22 ENCOUNTER — Telehealth: Payer: Self-pay

## 2024-01-22 NOTE — Telephone Encounter (Signed)
 Copied from CRM [id number]. Topic: Referral - Question >> Jan 21, 2024  4:59 PM DeAngela L wrote: Reason for CRM: the patient needs a referral for a dermatology and [facility] will fax over a dermatology referral request today   [facility] [phone] option #1 and ask for Ms [facility]

## 2024-04-12 ENCOUNTER — Encounter: Admitting: Internal Medicine
